# Patient Record
Sex: Male | Born: 1965
Health system: Southern US, Community
[De-identification: ages and names within clinical notes are randomized; demographics above are authoritative.]

## PROBLEM LIST (undated history)

## (undated) DIAGNOSIS — E119 Type 2 diabetes mellitus without complications: Secondary | ICD-10-CM

## (undated) DIAGNOSIS — E785 Hyperlipidemia, unspecified: Secondary | ICD-10-CM

## (undated) DIAGNOSIS — I213 ST elevation (STEMI) myocardial infarction of unspecified site: Secondary | ICD-10-CM

## (undated) HISTORY — DX: Type 2 diabetes mellitus without complications: E11.9

## (undated) HISTORY — DX: Hyperlipidemia, unspecified: E78.5

## (undated) HISTORY — PX: UPPER GASTROINTESTINAL ENDOSCOPY: SHX188

## (undated) HISTORY — PX: COLONOSCOPY: SHX174

## (undated) HISTORY — PX: NO PAST SURGERIES: SHX2092

---

## 2005-01-16 ENCOUNTER — Ambulatory Visit: Payer: Self-pay | Admitting: Family Medicine

## 2005-01-23 ENCOUNTER — Ambulatory Visit: Payer: Self-pay | Admitting: Internal Medicine

## 2011-05-25 ENCOUNTER — Ambulatory Visit (INDEPENDENT_AMBULATORY_CARE_PROVIDER_SITE_OTHER): Payer: 59

## 2011-05-25 DIAGNOSIS — B079 Viral wart, unspecified: Secondary | ICD-10-CM

## 2011-05-25 DIAGNOSIS — E119 Type 2 diabetes mellitus without complications: Secondary | ICD-10-CM

## 2011-05-27 ENCOUNTER — Ambulatory Visit (INDEPENDENT_AMBULATORY_CARE_PROVIDER_SITE_OTHER): Payer: 59

## 2011-05-27 DIAGNOSIS — B079 Viral wart, unspecified: Secondary | ICD-10-CM

## 2011-05-27 DIAGNOSIS — S60429A Blister (nonthermal) of unspecified finger, initial encounter: Secondary | ICD-10-CM

## 2011-07-18 ENCOUNTER — Other Ambulatory Visit: Payer: Self-pay | Admitting: Family Medicine

## 2011-08-05 ENCOUNTER — Ambulatory Visit (INDEPENDENT_AMBULATORY_CARE_PROVIDER_SITE_OTHER): Payer: 59 | Admitting: Family Medicine

## 2011-08-05 VITALS — BP 124/73 | HR 61 | Temp 97.8°F | Resp 16 | Ht 71.25 in | Wt 184.2 lb

## 2011-08-05 DIAGNOSIS — IMO0001 Reserved for inherently not codable concepts without codable children: Secondary | ICD-10-CM

## 2011-08-05 DIAGNOSIS — E1165 Type 2 diabetes mellitus with hyperglycemia: Secondary | ICD-10-CM

## 2011-08-05 LAB — POCT GLYCOSYLATED HEMOGLOBIN (HGB A1C): Hemoglobin A1C: 9

## 2011-08-05 MED ORDER — ROSUVASTATIN CALCIUM 10 MG PO TABS: 10.0000 mg | ORAL_TABLET | Freq: Every day | ORAL | Status: DC

## 2011-08-05 MED ORDER — LISINOPRIL 2.5 MG PO TABS: 2.5000 mg | ORAL_TABLET | Freq: Every day | ORAL | Status: DC

## 2011-08-05 MED ORDER — SAXAGLIPTIN HCL 2.5 MG PO TABS: 2.5000 mg | ORAL_TABLET | Freq: Every day | ORAL | Status: DC

## 2011-08-05 MED ORDER — METFORMIN HCL 1000 MG PO TABS: 1000.0000 mg | ORAL_TABLET | Freq: Two times a day (BID) | ORAL | Status: DC

## 2011-08-05 NOTE — Patient Instructions (Signed)
STOP glipizide.   Restart metformin - 1000mg  twice per day, and onglyza 2.5mg  one per day.  Check home blood sugars and if remain over 200 in next few weeks, return to clinic.  Otherwise recheck in 3 months.   Return to the clinic or go to the nearest emergency room if any of your symptoms worsen or new symptoms occur.

## 2011-08-05 NOTE — Progress Notes (Signed)
  Subjective:    Patient ID: Marcus Hart, male    DOB: 29-Aug-1965, 46 y.o.   MRN: [id number]  HPI Marcus Hart is a 46 y.o. male Hx of DM2 - last HGB A1c 7.6 05/25/11.  Prior on metformin 1 gram BID in July 2012 - A1C 8.0 - added Onglyza 2.5mg  qd, continued metformin 1 gram BID  03/03/11 - A1c 6.8 .  No changes in meds.  05/25/11 OV - A1c 7.6 - added glipizide 5mg  BID,  but patient thought was supposed to stop metformin and just take glipizide.  Has only been taking glipizide twice per day.  Home cbg's:  Fasting:  258, 207, 217.  Feels ok, but urinating more.  No abd pain, fever, n/v.       Review of Systems  Constitutional: Negative for fever and chills.  Respiratory: Negative for cough and shortness of breath.   Cardiovascular: Negative for chest pain.  Gastrointestinal: Negative for nausea, vomiting and abdominal pain.  Neurological: Negative for dizziness, light-headedness and headaches.       Objective:   Physical Exam  Constitutional: He is oriented to person, place, and time. He appears well-developed and well-nourished.  HENT:  Head: Normocephalic and atraumatic.  Eyes: Pupils are equal, round, and reactive to light.  Cardiovascular: Normal rate, regular rhythm, normal heart sounds and intact distal pulses.   Pulmonary/Chest: Effort normal and breath sounds normal.  Abdominal: Soft. There is no tenderness.  Neurological: He is alert and oriented to person, place, and time.       Microfilament testing of feet normal bilaterally.  Skin: Skin is warm, dry and intact. No rash noted.     Psychiatric: He has a normal mood and affect. His behavior is normal.    Results for orders placed in visit on 08/05/11  GLUCOSE, POCT (MANUAL RESULT ENTRY)      Component Value Range   POC Glucose 205    POCT GLYCOSYLATED HEMOGLOBIN (HGB A1C)      Component Value Range   Hemoglobin A1C 9.0         Assessment & Plan:  Marcus Hart is a 46 y.o. male. 1. Diabetes type 2,  uncontrolled  POCT glucose (manual entry), POCT glycosylated hemoglobin (Hb A1C)  confusion regarding prior med change, with recent uncontrolled cbg's.  Possible med changes d/w pt and wife - they feel like onglyza worked better. Restart metformin 1 gram BID, onglyza 2.5mg  qd, and stop glipizide.  Continue crestor, lisinopril and aspirin at current doses.  Check fasting blood sugars and 2 hour post prandials - if remain above 200, rtc next few weeks, otherwise schedule 3 month follow up.  Discussed hypopigmentation after cryotherapy for verrucal lesions, and possible persistence.  If any recurrence of lesions, rtc.

## 2011-11-06 ENCOUNTER — Encounter: Payer: Self-pay | Admitting: Family Medicine

## 2011-11-06 ENCOUNTER — Ambulatory Visit (INDEPENDENT_AMBULATORY_CARE_PROVIDER_SITE_OTHER): Payer: 59 | Admitting: Family Medicine

## 2011-11-06 VITALS — BP 107/65 | HR 60 | Temp 97.5°F | Resp 16 | Ht 71.0 in | Wt 179.0 lb

## 2011-11-06 DIAGNOSIS — E119 Type 2 diabetes mellitus without complications: Secondary | ICD-10-CM

## 2011-11-06 DIAGNOSIS — K219 Gastro-esophageal reflux disease without esophagitis: Secondary | ICD-10-CM

## 2011-11-06 DIAGNOSIS — E1165 Type 2 diabetes mellitus with hyperglycemia: Secondary | ICD-10-CM

## 2011-11-06 DIAGNOSIS — E785 Hyperlipidemia, unspecified: Secondary | ICD-10-CM

## 2011-11-06 DIAGNOSIS — IMO0001 Reserved for inherently not codable concepts without codable children: Secondary | ICD-10-CM

## 2011-11-06 LAB — COMPREHENSIVE METABOLIC PANEL
ALT: 16 U/L (ref 0–53)
CO2: 26 mEq/L (ref 19–32)
Calcium: 9.3 mg/dL (ref 8.4–10.5)
Chloride: 103 mEq/L (ref 96–112)
Creat: 0.84 mg/dL (ref 0.50–1.35)
Glucose, Bld: 116 mg/dL — ABNORMAL HIGH (ref 70–99)
Total Protein: 6.4 g/dL (ref 6.0–8.3)

## 2011-11-06 LAB — LIPID PANEL
Cholesterol: 138 mg/dL (ref 0–200)
Total CHOL/HDL Ratio: 2.6 Ratio

## 2011-11-06 LAB — POCT GLYCOSYLATED HEMOGLOBIN (HGB A1C): Hemoglobin A1C: 8.1

## 2011-11-06 MED ORDER — ROSUVASTATIN CALCIUM 10 MG PO TABS: 10.0000 mg | ORAL_TABLET | Freq: Every day | ORAL | Status: DC

## 2011-11-06 MED ORDER — METFORMIN HCL 1000 MG PO TABS: 1000.0000 mg | ORAL_TABLET | Freq: Two times a day (BID) | ORAL | Status: DC

## 2011-11-06 MED ORDER — SAXAGLIPTIN HCL 5 MG PO TABS: 5.0000 mg | ORAL_TABLET | Freq: Every day | ORAL | Status: DC

## 2011-11-06 MED ORDER — LISINOPRIL 2.5 MG PO TABS: 2.5000 mg | ORAL_TABLET | Freq: Every day | ORAL | Status: DC

## 2011-11-06 NOTE — Patient Instructions (Signed)
Your should receive a call or letter about your lab results within the next week to 10 days.  Recheck in 3 months, sooner if any recurrent problems with heartburn.  You can try over the counter zantac as needed and avoid the triggers of heartburn as stated below.  Return to the clinic or go to the nearest emergency room if any of your symptoms worsen or new symptoms occur. Heartburn Heartburn is a painful, burning sensation in the chest. It may feel worse in certain positions, such as lying down or bending over. It is caused by stomach acid backing up into the tube that carries food from the mouth down to the stomach (lower esophagus).  CAUSES   Large meals.   Certain foods and drinks.   Exercise.   Increased acid production.   Being overweight or obese.   Certain medicines.  SYMPTOMS   Burning pain in the chest or lower throat.   Bitter taste in the mouth.   Coughing.  DIAGNOSIS  If the usual treatments for heartburn do not improve your symptoms, then tests may be done to see if there is another condition present. Possible tests may include:  X-rays.   Endoscopy. This is when a tube with a light and a camera on the end is used to examine the esophagus and the stomach.   A test to measure the amount of acid in the esophagus (pH test).   A test to see if the esophagus is working properly (esophageal manometry).   Blood, breath, or stool tests to check for bacteria that cause ulcers.  TREATMENT   Your caregiver may tell you to use certain over-the-counter medicines (antacids, acid reducers) for mild heartburn.   Your caregiver may prescribe medicines to decrease the acid in your stomach or protect your stomach lining.   Your caregiver may recommend certain diet changes.   For severe cases, your caregiver may recommend that the head of your bed be elevated on blocks. (Sleeping with more pillows is not an effective treatment as it only changes the position of your head and  does not improve the main problem of stomach acid refluxing into the esophagus.)  HOME CARE INSTRUCTIONS   Take all medicines as directed by your caregiver.   Raise the head of your bed by putting blocks under the legs if instructed to by your caregiver.   Do not exercise right after eating.   Avoid eating 2 or 3 hours before bed. Do not lie down right after eating.   Eat small meals throughout the day instead of 3 large meals.   Stop smoking if you smoke.   Maintain a healthy weight.   Identify foods and beverages that make your symptoms worse and avoid them. Foods you may want to avoid include:   Peppers.   Chocolate.   High-fat foods, including fried foods.   Spicy foods.   Garlic and onions.   Citrus fruits, including oranges, grapefruit, lemons, and limes.   Food containing tomatoes or tomato products.   Mint.   Carbonated drinks, caffeinated drinks, and alcohol.   Vinegar.  SEEK IMMEDIATE MEDICAL CARE IF:  You have severe chest pain that goes down your arm or into your jaw or neck.   You feel sweaty, dizzy, or lightheaded.   You are short of breath.   You vomit blood.   You have difficulty or pain with swallowing.   You have bloody or black, tarry stools.   You have episodes of heartburn  more than 3 times a week for more than 2 weeks.  MAKE SURE YOU:  Understand these instructions.   Will watch your condition.   Will get help right away if you are not doing well or get worse.  Document Released: 09/20/2008 Document Revised: 04/23/2011 Document Reviewed: 10/19/2010 [facility] Patient Information 2012 [place], [place].

## 2011-11-06 NOTE — Progress Notes (Signed)
Subjective:    Patient ID: Marcus Hart, male    DOB: 01-17-1966, 46 y.o.   MRN: [id number]  HPI Marcus Hart is a 45 y.o. male Here for DM follow up.  Last a1c 9.0.  Changed glipizide to onglyza.  Home blood sugars - last few days - 90 fasting, post prandial around 130's. No symptomatic lows.  Metformin 1 gram BID, onglyza 2.5mg , crestor 10, and lisinopril 2.5mg . No missed doses.  No recent dentist visit.  Plans on scheduling.  Last optho visit - January - ok.  Feels good.  No concerns today, except occasional heartburn. Fasting since 6 am.  Lipids wnl 10/12.    Review of Systems  Constitutional: Negative for fatigue and unexpected weight change.  Eyes: Negative for visual disturbance.  Respiratory: Negative for cough, chest tightness and shortness of breath.   Cardiovascular: Negative for chest pain, palpitations and leg swelling.  Gastrointestinal: Negative for abdominal pain.       Stomach upset last month.  Feels ok now. Heartburn with certain foods.   Neurological: Negative for dizziness, light-headedness and headaches.       Objective:   Physical Exam  Constitutional: He is oriented to person, place, and time. He appears well-developed and well-nourished.  HENT:  Head: Normocephalic and atraumatic.  Eyes: Pupils are equal, round, and reactive to light.  Cardiovascular: Normal rate, regular rhythm, normal heart sounds and intact distal pulses.   Pulmonary/Chest: Effort normal and breath sounds normal.  Abdominal: Soft. He exhibits no distension. There is no tenderness. There is no rebound and no guarding.  Neurological: He is alert and oriented to person, place, and time.       Microfilament testing of feet normal bilaterally.  Skin: Skin is warm, dry and intact. No rash noted.  Psychiatric: He has a normal mood and affect. His behavior is normal.      Results for orders placed in visit on 11/06/11  GLUCOSE, POCT (MANUAL RESULT ENTRY)      Component Value Range     POC Glucose 121 (*) 70 - 99 mg/dl  POCT GLYCOSYLATED HEMOGLOBIN (HGB A1C)      Component Value Range   Hemoglobin A1C 8.1         Assessment & Plan:  Marcus Hart is a 46 y.o. male 1. Type II or unspecified type diabetes mellitus without mention of complication, not stated as uncontrolled  POCT glucose (manual entry), POCT glycosylated hemoglobin (Hb A1C)  2. Hyperlipidemia LDL goal < 100  rosuvastatin (CRESTOR) 10 MG tablet, Comprehensive metabolic panel, Lipid panel  3. DM (diabetes mellitus), type 2, uncontrolled  lisinopril (PRINIVIL,ZESTRIL) 2.5 MG tablet, metFORMIN (GLUCOPHAGE) 1000 MG tablet, rosuvastatin (CRESTOR) 10 MG tablet, Comprehensive metabolic panel, Lipid panel, saxagliptin HCl (ONGLYZA) 5 MG TABS tablet   Uncontrolled DM.  Cont metformin 1 gram BID, increase Onglyza to 5mg  QD, recheck in 3 months,  Check lipids, CMP. Continue lisinopril and crestor at current doses.  Episodic heartburn - avoid trigger foods. Can take zantac otc prn, and recheck if recurs.   Patient Instructions  Your should receive a call or letter about your lab results within the next week to 10 days.  Recheck in 3 months, sooner if any recurrent problems with heartburn.  You can try over the counter zantac as needed and avoid the triggers of heartburn as stated below.  Return to the clinic or go to the nearest emergency room if any of your symptoms worsen or new symptoms occur. Heartburn Heartburn is  a painful, burning sensation in the chest. It may feel worse in certain positions, such as lying down or bending over. It is caused by stomach acid backing up into the tube that carries food from the mouth down to the stomach (lower esophagus).  CAUSES   Large meals.   Certain foods and drinks.   Exercise.   Increased acid production.   Being overweight or obese.   Certain medicines.  SYMPTOMS   Burning pain in the chest or lower throat.   Bitter taste in the mouth.   Coughing.   DIAGNOSIS  If the usual treatments for heartburn do not improve your symptoms, then tests may be done to see if there is another condition present. Possible tests may include:  X-rays.   Endoscopy. This is when a tube with a light and a camera on the end is used to examine the esophagus and the stomach.   A test to measure the amount of acid in the esophagus (pH test).   A test to see if the esophagus is working properly (esophageal manometry).   Blood, breath, or stool tests to check for bacteria that cause ulcers.  TREATMENT   Your caregiver may tell you to use certain over-the-counter medicines (antacids, acid reducers) for mild heartburn.   Your caregiver may prescribe medicines to decrease the acid in your stomach or protect your stomach lining.   Your caregiver may recommend certain diet changes.   For severe cases, your caregiver may recommend that the head of your bed be elevated on blocks. (Sleeping with more pillows is not an effective treatment as it only changes the position of your head and does not improve the main problem of stomach acid refluxing into the esophagus.)  HOME CARE INSTRUCTIONS   Take all medicines as directed by your caregiver.   Raise the head of your bed by putting blocks under the legs if instructed to by your caregiver.   Do not exercise right after eating.   Avoid eating 2 or 3 hours before bed. Do not lie down right after eating.   Eat small meals throughout the day instead of 3 large meals.   Stop smoking if you smoke.   Maintain a healthy weight.   Identify foods and beverages that make your symptoms worse and avoid them. Foods you may want to avoid include:   Peppers.   Chocolate.   High-fat foods, including fried foods.   Spicy foods.   Garlic and onions.   Citrus fruits, including oranges, grapefruit, lemons, and limes.   Food containing tomatoes or tomato products.   Mint.   Carbonated drinks, caffeinated drinks, and  alcohol.   Vinegar.  SEEK IMMEDIATE MEDICAL CARE IF:  You have severe chest pain that goes down your arm or into your jaw or neck.   You feel sweaty, dizzy, or lightheaded.   You are short of breath.   You vomit blood.   You have difficulty or pain with swallowing.   You have bloody or black, tarry stools.   You have episodes of heartburn more than 3 times a week for more than 2 weeks.  MAKE SURE YOU:  Understand these instructions.   Will watch your condition.   Will get help right away if you are not doing well or get worse.  Document Released: 09/20/2008 Document Revised: 04/23/2011 Document Reviewed: 10/19/2010 [facility] Patient Information 2012 [place], [place].

## 2012-01-22 ENCOUNTER — Ambulatory Visit (INDEPENDENT_AMBULATORY_CARE_PROVIDER_SITE_OTHER): Payer: 59 | Admitting: Emergency Medicine

## 2012-01-22 VITALS — BP 124/76 | HR 77 | Temp 98.4°F | Resp 16 | Ht 71.0 in | Wt 176.0 lb

## 2012-01-22 DIAGNOSIS — S335XXA Sprain of ligaments of lumbar spine, initial encounter: Secondary | ICD-10-CM

## 2012-01-22 MED ORDER — TRAMADOL HCL 50 MG PO TABS: 50.0000 mg | ORAL_TABLET | Freq: Four times a day (QID) | ORAL | Status: AC | PRN

## 2012-01-22 MED ORDER — CYCLOBENZAPRINE HCL 10 MG PO TABS: 10.0000 mg | ORAL_TABLET | Freq: Three times a day (TID) | ORAL | Status: AC | PRN

## 2012-01-22 MED ORDER — NAPROXEN SODIUM 550 MG PO TABS: 550.0000 mg | ORAL_TABLET | Freq: Two times a day (BID) | ORAL | Status: AC

## 2012-01-22 NOTE — Progress Notes (Signed)
   Date:  01/22/2012   Name:  Marcus Hart   DOB:  01/10/1966   MRN:  [id number] Gender: male Age: 46 y.o.  PCP:  No primary provider on file.    Chief Complaint: Back Pain   History of Present Illness:  Marcus Hart is a 46 y.o. 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  Has low back pain that limits his activities such as bending forward that started last night.  Has no history of injury or specific overuse.  No radicular pain or neurologic symptoms.  Denies history of prior back injury.  There is no problem list on file for this patient.   No past medical history on file.  No past surgical history on file.  History  Substance Use Topics  . Smoking status: Never Smoker   . Smokeless tobacco: Not on file  . Alcohol Use: Not on file    No family history on file.  No Known Allergies  Medication list has been reviewed and updated.  Current Outpatient Prescriptions on File Prior to Visit  Medication Sig Dispense Refill  . aspirin 81 MG tablet Take 81 mg by mouth daily.      Marland Kitchen lisinopril (PRINIVIL,ZESTRIL) 2.5 MG tablet Take 1 tablet (2.5 mg total) by mouth daily.  90 tablet  1  . metFORMIN (GLUCOPHAGE) 1000 MG tablet Take 1 tablet (1,000 mg total) by mouth 2 (two) times daily with a meal.  180 tablet  1  . rosuvastatin (CRESTOR) 10 MG tablet Take 1 tablet (10 mg total) by mouth daily.  90 tablet  1  . saxagliptin HCl (ONGLYZA) 5 MG TABS tablet Take 1 tablet (5 mg total) by mouth daily.  90 tablet  1    Review of Systems:  As per HPI, otherwise negative.    Physical Examination: Filed Vitals:   01/22/12 1447  BP: 124/76  Pulse: 77  Temp: 98.4 F (36.9 C)  Resp: 16   Filed Vitals:   01/22/12 1447  Height: 5\' 11"  (1.803 m)  Weight: 176 lb (79.833 kg)   Body mass index is 24.55 kg/(m^2). Ideal Body Weight: Weight in (lb) to have BMI = 25: 178.9    GEN: WDWN, NAD, Non-toxic,  Alert & Oriented x 3 HEENT: Atraumatic, Normocephalic.  Ears and Nose: No external deformity. EXTR: No clubbing/cyanosis/edema NEURO: Normal gait.  PSYCH: Normally interactive. Conversant. Not depressed or anxious appearing.  Calm demeanor.  Back:  Tender lumbar paraspinous muscles bilaterally.  SLR negative, neuro intact with symmetric DTR's  Assessment and Plan: Lumbar strain Anaprox Flexeril Local heat Follow up as needed  Carmelina Dane, MD

## 2012-02-05 ENCOUNTER — Encounter: Payer: Self-pay | Admitting: Family Medicine

## 2012-02-05 ENCOUNTER — Ambulatory Visit (INDEPENDENT_AMBULATORY_CARE_PROVIDER_SITE_OTHER): Payer: 59 | Admitting: Family Medicine

## 2012-02-05 VITALS — BP 124/79 | HR 60 | Temp 97.4°F | Resp 16 | Ht 70.5 in | Wt 176.8 lb

## 2012-02-05 DIAGNOSIS — M545 Low back pain, unspecified: Secondary | ICD-10-CM

## 2012-02-05 DIAGNOSIS — IMO0001 Reserved for inherently not codable concepts without codable children: Secondary | ICD-10-CM

## 2012-02-05 DIAGNOSIS — E1165 Type 2 diabetes mellitus with hyperglycemia: Secondary | ICD-10-CM

## 2012-02-05 DIAGNOSIS — E119 Type 2 diabetes mellitus without complications: Secondary | ICD-10-CM

## 2012-02-05 LAB — POCT GLYCOSYLATED HEMOGLOBIN (HGB A1C): Hemoglobin A1C: 7.7

## 2012-02-05 LAB — GLUCOSE, POCT (MANUAL RESULT ENTRY): POC Glucose: 118 mg/dl — AB (ref 70–99)

## 2012-02-05 NOTE — Progress Notes (Signed)
  Subjective:    Patient ID: Marcus Hart, male    DOB: 02/26/1966, 46 y.o.   MRN: [id number]  HPI Marcus Hart is a 45 y.o. male  DM2- A1c 8.1 on 11/06/11. Continued metformin 1 gram BID, increased Onglyza to 5mg  QD.  Lipids and LFT's WNL.   Home blood sugars - 120-130 up to 160 or 180 post prandial.   No symptomatic low blood sugars - lowest 80 - one time. Has candy if needed. Had fasted for Ramadan during day, but was eating at night - affected blood sugars.  Low back pain - see ov 2 weeks ago with Dr. Dareen Piano. 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  [id number], Flexeril, Local heat.  Still some soreness noted last night - would like to try 2  Days off work - returns in 3 days.  .No bowel or bladder incontinence, no saddle anesthesia, no lower extremity weakness. Feeling better. Taking flexeril as needed, and naprosyn prn - not all the time. No refills needed.    2 girls and 1 boy at home.(youngest 5 months).  Review of Systems  Constitutional: Negative for fatigue and unexpected weight change.  Eyes: Negative for visual disturbance.  Respiratory: Negative for cough, chest tightness and shortness of breath.   Cardiovascular: Negative for chest pain, palpitations and leg swelling.  Gastrointestinal: Negative for abdominal pain and blood in stool.  Genitourinary: Negative for difficulty urinating.  Musculoskeletal: Positive for myalgias and back pain.  Skin: Negative for color change and rash.  Neurological: Negative for dizziness, weakness, light-headedness and headaches.       No le weakness. No bowel/bladder incontinence, no saddle anesthesia.   As above.      Objective:   Physical Exam  Constitutional: He is oriented to person, place, and time. He appears well-developed and well-nourished.  HENT:  Head: Normocephalic and atraumatic.  Eyes: Pupils are equal, round, and reactive to light.  Cardiovascular: Normal rate, regular rhythm,  normal heart sounds and intact distal pulses.   Pulmonary/Chest: Effort normal and breath sounds normal.  Abdominal: Soft. There is no tenderness.  Neurological: He is alert and oriented to person, place, and time.       Microfilament testing of feet normal bilaterally.  Skin: Skin is warm, dry and intact. No rash noted.  Psychiatric: He has a normal mood and affect. His behavior is normal.     Results for orders placed in visit on 02/05/12  GLUCOSE, POCT (MANUAL RESULT ENTRY)      Component Value Range   POC Glucose 118 (*) 70 - 99 mg/dl  POCT GLYCOSYLATED HEMOGLOBIN (HGB A1C)      Component Value Range   Hemoglobin A1C 7.1         Assessment & Plan:  Marcus Hart is a 46 y.o. male 1. Diabetes mellitus  POCT glucose (manual entry), POCT glycosylated hemoglobin (Hb A1C)  2. DM (diabetes mellitus), type 2, uncontrolled    3. Low back pain      DM2 - uncontrolled, but borderline and much improved.  Continue same doses of meds (should have 1 refill until December OV, but can call in if needed).  Continue to work on diet.   LBP - mechanical, muscular pain likely - improving.  Back care manual for HEP, naprosyn, flexeril prn only.   rtc precautions.  Recheck in 3 months.

## 2012-05-05 ENCOUNTER — Ambulatory Visit: Payer: 59

## 2012-05-05 ENCOUNTER — Ambulatory Visit (INDEPENDENT_AMBULATORY_CARE_PROVIDER_SITE_OTHER): Payer: 59 | Admitting: Family Medicine

## 2012-05-05 ENCOUNTER — Encounter: Payer: Self-pay | Admitting: Radiology

## 2012-05-05 VITALS — BP 110/74 | HR 84 | Temp 99.4°F | Resp 16 | Ht 71.0 in | Wt 179.2 lb

## 2012-05-05 DIAGNOSIS — R05 Cough: Secondary | ICD-10-CM

## 2012-05-05 DIAGNOSIS — J40 Bronchitis, not specified as acute or chronic: Secondary | ICD-10-CM

## 2012-05-05 DIAGNOSIS — R509 Fever, unspecified: Secondary | ICD-10-CM

## 2012-05-05 DIAGNOSIS — IMO0001 Reserved for inherently not codable concepts without codable children: Secondary | ICD-10-CM

## 2012-05-05 DIAGNOSIS — E785 Hyperlipidemia, unspecified: Secondary | ICD-10-CM

## 2012-05-05 DIAGNOSIS — E1165 Type 2 diabetes mellitus with hyperglycemia: Secondary | ICD-10-CM

## 2012-05-05 DIAGNOSIS — R059 Cough, unspecified: Secondary | ICD-10-CM

## 2012-05-05 LAB — POCT CBC
Hemoglobin: 14.7 g/dL (ref 14.1–18.1)
MCHC: 31.7 g/dL — AB (ref 31.8–35.4)
MID (cbc): 0.3 (ref 0–0.9)
MPV: 9.5 fL (ref 0–99.8)
Platelet Count, POC: 187 10*3/uL (ref 142–424)
RDW, POC: 13.4 %
WBC: 4.4 10*3/uL — AB (ref 4.6–10.2)

## 2012-05-05 LAB — COMPREHENSIVE METABOLIC PANEL
BUN: 19 mg/dL (ref 6–23)
CO2: 23 mEq/L (ref 19–32)
Creat: 0.93 mg/dL (ref 0.50–1.35)
Glucose, Bld: 143 mg/dL — ABNORMAL HIGH (ref 70–99)
Total Bilirubin: 0.4 mg/dL (ref 0.3–1.2)

## 2012-05-05 LAB — LIPID PANEL
Cholesterol: 159 mg/dL (ref 0–200)
Total CHOL/HDL Ratio: 3 Ratio
Triglycerides: 76 mg/dL (ref <150)
VLDL: 15 mg/dL (ref 0–40)

## 2012-05-05 LAB — POCT INFLUENZA A/B: Influenza A, POC: NEGATIVE

## 2012-05-05 MED ORDER — METFORMIN HCL 1000 MG PO TABS: 1000.0000 mg | ORAL_TABLET | Freq: Two times a day (BID) | ORAL | Status: DC

## 2012-05-05 MED ORDER — HYDROCODONE-HOMATROPINE 5-1.5 MG/5ML PO SYRP: ORAL_SOLUTION | ORAL | Status: DC

## 2012-05-05 MED ORDER — SAXAGLIPTIN HCL 5 MG PO TABS: 5.0000 mg | ORAL_TABLET | Freq: Every day | ORAL | Status: DC

## 2012-05-05 MED ORDER — ROSUVASTATIN CALCIUM 10 MG PO TABS: 10.0000 mg | ORAL_TABLET | Freq: Every day | ORAL | Status: DC

## 2012-05-05 MED ORDER — AZITHROMYCIN 500 MG PO TABS: 500.0000 mg | ORAL_TABLET | Freq: Every day | ORAL | Status: DC

## 2012-05-05 MED ORDER — LISINOPRIL 2.5 MG PO TABS: 2.5000 mg | ORAL_TABLET | Freq: Every day | ORAL | Status: DC

## 2012-05-05 NOTE — Progress Notes (Signed)
Patient ID: Marcus Hart MRN: [id number], DOB: [birth year], 46 y.o. Date of Encounter: 05/05/2012, 9:01 AM  Primary Physician: Trinna Post, MD  Chief Complaint:  Chief Complaint  Patient presents with  . Cough    green sputum x 2 weeks  . Fever    HPI: 46 y.o. year old male presents with a two week history of nasal congestion, cough, and fever. Nasal congestion thick and green/yellow. Cough is productive of green/yellow sputum and not associated with time of day. Cough has occasionally woken him from his sleep. No associated shortness of breath or wheezing. He has had associated fever, chills, and myalgias at home, but has been unable to take his temperature there. No sore throat, sinus pressure, headache, otalgia, or abdominal pain. Normal hearing. Last took Tylenol at 6 PM the previous evening. No GI complaints. Appetite slightly decreased, but he is eating and has increased his fluids. His 30 month old daughter has been sick with similar symptoms. No recent antibiotics or recent travels. No leg trauma, sedentary periods, history of cancer, or tobacco use.   He was last seen for his diabetes on 02/05/12. Last A1C 7.1. Home blood sugars have been around 110. Has not checked in the last three days secondary to his above illness. Max blood sugar . No symptomatic low blood sugars. Taking medications daily without issue. Metformin 1000 mg bid, Lisinopril 2.5 mg daily, aspirin 21 mg daily, Crestor 10 mg daily, and Onglyza 5 mg daily.    Past Medical History  Diagnosis Date  . Diabetes mellitus   . Hyperlipidemia      Home Meds: Prior to Admission medications   Medication Sig Start Date End Date Taking? Authorizing Provider  aspirin 81 MG tablet Take 81 mg by mouth daily.   Yes Historical Provider, MD  lisinopril (PRINIVIL,ZESTRIL) 2.5 MG tablet Take 1 tablet (2.5 mg total) by mouth daily. 11/06/11  Yes Shade Flood, MD  metFORMIN (GLUCOPHAGE) 1000 MG tablet Take 1 tablet (1,000 mg  total) by mouth 2 (two) times daily with a meal. 11/06/11 11/05/12 Yes Shade Flood, MD  naproxen sodium (ANAPROX DS) 550 MG tablet Take 1 tablet (550 mg total) by mouth 2 (two) times daily with a meal. 01/22/12 01/21/13 Yes Phillips Odor, MD  rosuvastatin (CRESTOR) 10 MG tablet Take 1 tablet (10 mg total) by mouth daily. 11/06/11  Yes Shade Flood, MD  saxagliptin HCl (ONGLYZA) 5 MG TABS tablet Take 1 tablet (5 mg total) by mouth daily. 11/06/11  Yes Shade Flood, MD    Allergies: No Known Allergies  History   Social History  . Marital Status: Married    Spouse Name: N/A    Number of Children: N/A  . Years of Education: N/A   Occupational History  . Not on file.   Social History Main Topics  . Smoking status: Never Smoker   . Smokeless tobacco: Not on file  . Alcohol Use: Not on file  . Drug Use: Not on file  . Sexually Active: Not on file   Other Topics Concern  . Not on file   Social History Narrative  . No narrative on file     Review of Systems: Constitutional: Positive for fever, chills, and fatigue. Negative for night sweats or weight changes HEENT: See above Cardiovascular: negative for chest pain or palpitations Respiratory: Positive for cough. Negative for hemoptysis, wheezing, or shortness of breath Abdominal: negative for abdominal pain, nausea, vomiting or diarrhea Dermatological: negative for rash  Neurologic: negative for headache   Physical Exam: Blood pressure 110/74, pulse 84, temperature 99.4 F (37.4 C), temperature source Oral, resp. rate 16, height 5\' 11"  (1.803 m), weight 179 lb 3.2 oz (81.285 kg), SpO2 100.00%., Body mass index is 24.99 kg/(m^2). General: Well developed, well nourished, in no acute distress. Head: Normocephalic, atraumatic, eyes without discharge, sclera non-icteric, nares are congested. Bilateral auditory canals clear, TM's are without perforation, pearly grey with reflective cone of light bilaterally. No sinus TTP. Oral  cavity moist, dentition normal. Posterior pharynx with post nasal drip and mild erythema. No peritonsillar abscess or tonsillar exudate. Uvula midline.  Neck: Supple. No thyromegaly. Full ROM. No lymphadenopathy. Lungs: Coarse breath sounds bilaterally without wheezes, rales, or rhonchi. Breathing is unlabored.  Heart: RRR with S1 S2. No murmurs, rubs, or gallops appreciated. Msk:  Strength and tone normal for age. Extremities: No clubbing or cyanosis. No edema. Monofilament exam unremarkable bilaterally. No wounds along plantar surfaces of bilateral feet.  Neuro: Alert and oriented X 3. Moves all extremities spontaneously. CNII-XII grossly in tact. Psych:  Responds to questions appropriately with a normal affect.   Labs: Results for orders placed in visit on 05/05/12  POCT CBC      Component Value Range   WBC 4.4 (*) 4.6 - 10.2 K/uL   Lymph, poc 1.1  0.6 - 3.4   POC LYMPH PERCENT 25.9  10 - 50 %L   MID (cbc) 0.3  0 - 0.9   POC MID % 6.1  0 - 12 %M   POC Granulocyte 3.0  2 - 6.9   Granulocyte percent 68.0  37 - 80 %G   RBC 5.30  4.69 - 6.13 M/uL   Hemoglobin 14.7  14.1 - 18.1 g/dL   HCT, POC 16.1  09.6 - 53.7 %   MCV 87.6  80 - 97 fL   MCH, POC 27.7  27 - 31.2 pg   MCHC 31.7 (*) 31.8 - 35.4 g/dL   RDW, POC 04.5     Platelet Count, POC 187  142 - 424 K/uL   MPV 9.5  0 - 99.8 fL  POCT INFLUENZA A/B      Component Value Range   Influenza A, POC Negative     Influenza B, POC Negative    GLUCOSE, POCT (MANUAL RESULT ENTRY)      Component Value Range   POC Glucose 156 (*) 70 - 99 mg/dl  POCT GLYCOSYLATED HEMOGLOBIN (HGB A1C)      Component Value Range   Hemoglobin A1C 7.4     Last A1C: 7.1 in September 7.1  CMP and lipid pending.  CXR: [facility] reading (PRIMARY) by  Dr. Neva Seat. Increased markings bilaterally, right greater than left, without discrete infiltrate.   ASSESSMENT AND PLAN:  46 y.o. year old male with bronchitis, fever, chills, myalgias, uncontrolled diabetes  mellitus, & hyperlipidemia 1. Bronchitis/cough/fever/myalgias -Azithromycin 500 mg 1 po daily #5 no RF -Hycodan #4oz 1 tsp po q 4-6 hours prn cough no RF SED -Mucinex -Tylenol/Motrin prn -Rest/fluids -Out of work if needed through 05/06/12 -RTC precautions -RTC 3-5 days if no improvement  2. Uncontrolled diabetes mellitus -Continue current medications -Metformin 1000 mg 1 po bid #180 RF 1 -Onglyza 5 mg 1 po daily #90 RF 1 -Lisinopril 2.5 mg 1 po daily #90 RF 1 -Continue aspirin 81 mg daily -Healthy diet and exercise -Await labs  3. Hyperlipidemia -Continue current medications -Crestor 10 mg 1 po daily #90 RF 1 -Healthy diet and exercise -  Await labs  4. Follow up with Dr. Neva Seat in January 2014  Elinor Dodge, PA-C 05/05/2012 9:01 AM

## 2012-05-06 NOTE — Progress Notes (Signed)
Xray read and patient discussed with Ryan Dunn, PA-C. Agree with assessment and plan of care per his note.   

## 2012-05-09 ENCOUNTER — Ambulatory Visit: Payer: 59 | Admitting: Family Medicine

## 2012-05-16 ENCOUNTER — Ambulatory Visit: Payer: 59 | Admitting: Family Medicine

## 2012-05-23 ENCOUNTER — Ambulatory Visit (INDEPENDENT_AMBULATORY_CARE_PROVIDER_SITE_OTHER): Payer: 59 | Admitting: Family Medicine

## 2012-05-23 VITALS — BP 110/78 | HR 60 | Temp 98.3°F | Resp 16 | Ht 71.0 in | Wt 179.0 lb

## 2012-05-23 DIAGNOSIS — J069 Acute upper respiratory infection, unspecified: Secondary | ICD-10-CM

## 2012-05-23 DIAGNOSIS — R05 Cough: Secondary | ICD-10-CM

## 2012-05-23 DIAGNOSIS — R059 Cough, unspecified: Secondary | ICD-10-CM

## 2012-05-23 MED ORDER — LEVOFLOXACIN 500 MG PO TABS: 500.0000 mg | ORAL_TABLET | Freq: Every day | ORAL | Status: DC

## 2012-05-23 MED ORDER — BENZONATATE 100 MG PO CAPS: 200.0000 mg | ORAL_CAPSULE | Freq: Two times a day (BID) | ORAL | Status: AC | PRN

## 2012-05-23 NOTE — Progress Notes (Signed)
[facility]:  Office Visit  Chief Complaint:  Chief Complaint  Patient presents with  . Cough    x 3 week    HPI: Marcus Hart is a 47 y.o. male who complains of  Cough. Continues to have cough 3 weeks after treatment with Azithromycin 500 mg daily x 5 days. His cough is better in terms of green sputum, now more white and yellow. But he still has cough and had fever on Saturday, daughter has PNA based on ER assessment. Was on hydromet, which helped some. Did not take flu vaccine this year.  Denies SOB. + Sore throat from coughing.  Past Medical History  Diagnosis Date  . Diabetes mellitus   . Hyperlipidemia    History reviewed. No pertinent past surgical history. History   Social History  . Marital Status: Married    Spouse Name: N/A    Number of Children: N/A  . Years of Education: N/A   Social History Main Topics  . Smoking status: Never Smoker   . Smokeless tobacco: None  . Alcohol Use: None  . Drug Use: None  . Sexually Active: None   Other Topics Concern  . None   Social History Narrative  . None   History reviewed. No pertinent family history. No Known Allergies Prior to Admission medications   Medication Sig Start Date End Date Taking? Authorizing Provider  aspirin 81 MG tablet Take 81 mg by mouth daily.   Yes Historical Provider, MD  HYDROcodone-homatropine [facility]) 5-1.5 MG/5ML syrup 1 TSP PO Q 4-6 HOURS PRN COUGH 05/05/12  Yes Ryan M Dunn, PA-C  lisinopril (PRINIVIL,ZESTRIL) 2.5 MG tablet Take 1 tablet (2.5 mg total) by mouth daily. 05/05/12  Yes Ryan M Dunn, PA-C  metFORMIN (GLUCOPHAGE) 1000 MG tablet Take 1 tablet (1,000 mg total) by mouth 2 (two) times daily with a meal. 05/05/12 05/05/13 Yes Ryan M Dunn, PA-C  naproxen sodium (ANAPROX DS) 550 MG tablet Take 1 tablet (550 mg total) by mouth 2 (two) times daily with a meal. 01/22/12 01/21/13 Yes Phillips Odor, MD  rosuvastatin (CRESTOR) 10 MG tablet Take 1 tablet (10 mg total) by  mouth daily. 05/05/12  Yes Ryan M Dunn, PA-C  saxagliptin HCl (ONGLYZA) 5 MG TABS tablet Take 1 tablet (5 mg total) by mouth daily. 05/05/12  Yes Ryan M Dunn, PA-C  azithromycin (ZITHROMAX) 500 MG tablet Take 1 tablet (500 mg total) by mouth daily. 05/05/12   Sondra Barges, PA-C     ROS: The patient denies night sweats, unintentional weight loss, chest pain, palpitations, wheezing, dyspnea on exertion, nausea, vomiting, abdominal pain, dysuria, hematuria, melena, numbness, weakness, or tingling.  All other systems have been reviewed and were otherwise negative with the exception of those mentioned in the HPI and as above.    PHYSICAL EXAM: Filed Vitals:   05/23/12 1236  BP: 110/78  Pulse: 60  Temp: 98.3 F (36.8 C)  Resp: 16  Spo2    100% Filed Vitals:   05/23/12 1236  Height: 5\' 11"  (1.803 m)  Weight: 179 lb (81.194 kg)   Body mass index is 24.97 kg/(m^2).  General: Alert, no acute distress HEENT:  Normocephalic, atraumatic, oropharynx patent. Tm nl.No exudates. EOMI.  Cardiovascular:  Regular rate and rhythm, no rubs murmurs or gallops.  No Carotid bruits, radial pulse intact. No pedal edema.  Respiratory: Clear to auscultation bilaterally.  No wheezes, rales, or rhonchi.  No cyanosis, no use of accessory musculature GI: No organomegaly, abdomen  is soft and non-tender, positive bowel sounds.  No masses. Skin: No rashes. Neurologic: Facial musculature symmetric. Psychiatric: Patient is appropriate throughout our interaction. Lymphatic: No cervical lymphadenopathy Musculoskeletal: Gait intact.   LABS:    EKG/XRAY:   Primary read interpreted by Dr. Conley Rolls at [facility].   ASSESSMENT/PLAN: Encounter Diagnoses  Name Primary?  . URI, acute Yes  . Cough    Rx Levaquin for lack of improvement with Azithromycin, if this does not help then likely viral in origin. Will treat since has exposure to PNA and recurrence of fevers.  Rx Tessalon Perles for cough, c/w hydromet syrup prn  F/u  prn    LE, THAO PHUONG, DO 05/25/2012 11:53 AM

## 2012-07-22 ENCOUNTER — Encounter (HOSPITAL_COMMUNITY): Payer: Self-pay | Admitting: Emergency Medicine

## 2012-07-22 ENCOUNTER — Emergency Department (HOSPITAL_COMMUNITY)
Admission: EM | Admit: 2012-07-22 | Discharge: 2012-07-22 | Disposition: A | Discharge status: Home / Self Care | Payer: 59 | Source: Home / Self Care | Attending: Emergency Medicine | Admitting: Emergency Medicine

## 2012-07-22 ENCOUNTER — Emergency Department (INDEPENDENT_AMBULATORY_CARE_PROVIDER_SITE_OTHER): Payer: 59

## 2012-07-22 DIAGNOSIS — J209 Acute bronchitis, unspecified: Secondary | ICD-10-CM

## 2012-07-22 MED ORDER — HYDROCOD POLST-CHLORPHEN POLST 10-8 MG/5ML PO LQCR
5.0000 mL | Freq: Two times a day (BID) | ORAL | Status: DC | PRN
Start: 2012-07-22 — End: 2013-07-05

## 2012-07-22 MED ORDER — DOXYCYCLINE HYCLATE 100 MG PO TABS: 100.0000 mg | ORAL_TABLET | Freq: Two times a day (BID) | ORAL | Status: DC

## 2012-07-22 MED ORDER — ALBUTEROL SULFATE HFA 108 (90 BASE) MCG/ACT IN AERS: 2.0000 | INHALATION_SPRAY | Freq: Four times a day (QID) | RESPIRATORY_TRACT | Status: DC

## 2012-07-22 MED ORDER — BECLOMETHASONE DIPROPIONATE 80 MCG/ACT IN AERS: 2.0000 | INHALATION_SPRAY | Freq: Two times a day (BID) | RESPIRATORY_TRACT | Status: DC

## 2012-07-22 NOTE — ED Provider Notes (Signed)
Chief Complaint  Patient presents with  . Cough    History of Present Illness:   Marcus Hart  is a 47 year old male with diabetes and hyperlipidemia who presents with a three-week history of a cough productive of moderate amounts of green sputum. He denies any hemoptysis, wheezing, chest tightness, or chest pain. He has also had nasal congestion, rhinorrhea, slight sore throat, and headache. He's felt feverish and chilled. He denies any GI symptoms. 3 months ago he was seen at [facility] for pneumonia. He was given first a Z-Pak, he returned with no improvement and was given Levaquin and then did improve after that. He has done well up until the present.  Review of Systems:  Other than noted above, the patient denies any of the following symptoms. Systemic:  No fever, chills, sweats, fatigue, myalgias, headache, or anorexia. Eye:  No redness, pain or drainage. ENT:  No earache, ear congestion, nasal congestion, sneezing, rhinorrhea, sinus pressure, sinus pain, post nasal drip, or sore throat. Lungs:  No cough, sputum production, wheezing, shortness of breath, or chest pain. GI:  No abdominal pain, nausea, vomiting, or diarrhea.  PMFSH:  Past medical history, family history, social history, meds, and allergies were reviewed.  Physical Exam:   Vital signs:  BP 137/85  Pulse 66  Temp(Src) 98.8 F (37.1 C) (Oral)  Resp 16  SpO2 99% General:  Alert, in no distress. Eye:  No conjunctival injection or drainage. Lids were normal. ENT:  TMs and canals were normal, without erythema or inflammation.  Nasal mucosa was clear and uncongested, without drainage.  Mucous membranes were moist.  Pharynx was clear, without exudate or drainage.  There were no oral ulcerations or lesions. Neck:  Supple, no adenopathy, tenderness or mass. Lungs:  No respiratory distress.  Lungs were clear to auscultation, without wheezes, rales or rhonchi.  Breath sounds were clear and equal bilaterally.   Heart:  Regular rhythm, without gallops, murmers or rubs. Skin:  Clear, warm, and dry, without rash or lesions.  Radiology:  Dg Chest 2 View  07/22/2012  *RADIOLOGY REPORT*  Clinical Data: Productive cough for 3 weeks, history diabetes, hyperlipidemia  CHEST - 2 VIEW  Comparison: 05/05/2012  Findings: Normal heart size, mediastinal contours, and pulmonary vascularity. Lungs clear. No pleural effusion or pneumothorax. Bones unremarkable.  IMPRESSION: No acute abnormalities.   Original Report Authenticated By: Ulyses Southward, M.D.    I reviewed the images independently and personally and concur with the radiologist's findings.  Assessment:  The encounter diagnosis was Acute bronchitis.  He appears to have bronchitis. There is no evidence for pneumonia this time.  Plan:   1.  The following meds were prescribed:   Discharge Medication List as of 07/22/2012  5:16 PM    START taking these medications   Details  albuterol (PROVENTIL HFA;VENTOLIN HFA) 108 (90 BASE) MCG/ACT inhaler Inhale 2 puffs into the lungs 4 (four) times daily., Starting 07/22/2012, Until Discontinued, Normal    beclomethasone (QVAR) 80 MCG/ACT inhaler Inhale 2 puffs into the lungs 2 (two) times daily., Starting 07/22/2012, Until Discontinued, Normal    chlorpheniramine-HYDROcodone (TUSSIONEX) 10-8 MG/5ML LQCR Take 5 mLs by mouth every 12 (twelve) hours as needed., Starting 07/22/2012, Until Discontinued, Normal    doxycycline (VIBRA-TABS) 100 MG tablet Take 1 tablet (100 mg total) by mouth 2 (two) times daily., Starting 07/22/2012, Until Discontinued, Normal       2.  The patient was instructed in symptomatic care and handouts were given. 3.  The patient was told to return if becoming worse in any way, if no better in one week, and given some red flag symptoms such as difficulty breathing, chest pain, or high fever that would indicate earlier return.   Reuben Likes, MD 07/22/12 [phone]

## 2012-07-22 NOTE — ED Notes (Signed)
C/o cough x 3 weeks, with runny nose with greenish congestion.  No ear ache or sore throat.  Had fever last night and used Tylenol.

## 2012-08-03 ENCOUNTER — Ambulatory Visit (INDEPENDENT_AMBULATORY_CARE_PROVIDER_SITE_OTHER): Payer: 59 | Admitting: Family Medicine

## 2012-08-03 ENCOUNTER — Ambulatory Visit: Payer: 59

## 2012-08-03 VITALS — BP 144/92 | HR 60 | Temp 98.7°F | Resp 16 | Ht 71.0 in | Wt 181.4 lb

## 2012-08-03 DIAGNOSIS — M545 Low back pain, unspecified: Secondary | ICD-10-CM

## 2012-08-03 DIAGNOSIS — S39012A Strain of muscle, fascia and tendon of lower back, initial encounter: Secondary | ICD-10-CM

## 2012-08-03 DIAGNOSIS — S335XXA Sprain of ligaments of lumbar spine, initial encounter: Secondary | ICD-10-CM

## 2012-08-03 MED ORDER — NAPROXEN 500 MG PO TABS
500.0000 mg | ORAL_TABLET | Freq: Two times a day (BID) | ORAL | Status: DC
Start: 2012-08-03 — End: 2013-07-05

## 2012-08-03 MED ORDER — CYCLOBENZAPRINE HCL 5 MG PO TABS: 5.0000 mg | ORAL_TABLET | Freq: Two times a day (BID) | ORAL | Status: DC | PRN

## 2012-08-03 NOTE — Progress Notes (Signed)
[address]   [place], [place]  [address]   [phone]  Subjective:    Patient ID: Marcus Hart, male    DOB: [birth year], 47 y.o.   MRN: [id number]  HPI This 47 y.o. male presents for evaluation of lower back pain. Helped friend move yesterday.  Onset of pain after helping friend move.  Bending makes worse; twisting makes worse.No radiation into legs.  No n/t/w.  Normal b/b function; no saddle paresthesias.  Pain kept awake last night.  No dysuria, hematuria.  History of back injury other than in 01/2012 when presented to [facility] for lower back pain; no xrays obtained at that time.  No medications for pain.  PCP:  Neva Seat   Review of Systems  Constitutional: Negative for fever, chills, diaphoresis and fatigue.  Genitourinary: Negative for decreased urine volume.  Musculoskeletal: Positive for myalgias and back pain. Negative for joint swelling and arthralgias.  Skin: Negative for rash.  Neurological: Negative for weakness and numbness.    Past Medical History  Diagnosis Date  . Diabetes mellitus   . Hyperlipidemia     History reviewed. No pertinent past surgical history.  Prior to Admission medications   Medication Sig Start Date End Date Taking? Authorizing Provider  albuterol (PROVENTIL HFA;VENTOLIN HFA) 108 (90 BASE) MCG/ACT inhaler Inhale 2 puffs into the lungs 4 (four) times daily. 07/22/12  Yes Reuben Likes, MD  aspirin 81 MG tablet Take 81 mg by mouth daily.   Yes Historical Provider, MD  HYDROcodone-homatropine [facility]) 5-1.5 MG/5ML syrup 1 TSP PO Q 4-6 HOURS PRN COUGH 05/05/12  Yes Ryan M Dunn, PA-C  lisinopril (PRINIVIL,ZESTRIL) 2.5 MG tablet Take 1 tablet (2.5 mg total) by mouth daily. 05/05/12  Yes Ryan M Dunn, PA-C  metFORMIN (GLUCOPHAGE) 1000 MG tablet Take 1 tablet (1,000 mg total) by mouth 2 (two) times daily with a meal. 05/05/12 05/05/13 Yes Ryan M Dunn, PA-C  rosuvastatin (CRESTOR) 10 MG tablet Take 1 tablet (10 mg total) by mouth daily. 05/05/12  Yes Ryan M  Dunn, PA-C  saxagliptin HCl (ONGLYZA) 5 MG TABS tablet Take 1 tablet (5 mg total) by mouth daily. 05/05/12  Yes Ryan M Dunn, PA-C  beclomethasone (QVAR) 80 MCG/ACT inhaler Inhale 2 puffs into the lungs 2 (two) times daily. 07/22/12   Reuben Likes, MD  chlorpheniramine-HYDROcodone (TUSSIONEX) 10-8 MG/5ML LQCR Take 5 mLs by mouth every 12 (twelve) hours as needed. 07/22/12   Reuben Likes, MD  doxycycline (VIBRA-TABS) 100 MG tablet Take 1 tablet (100 mg total) by mouth 2 (two) times daily. 07/22/12   Reuben Likes, MD  levofloxacin (LEVAQUIN) 500 MG tablet Take 1 tablet (500 mg total) by mouth daily. 05/23/12   Thao P Le, DO  naproxen sodium (ANAPROX DS) 550 MG tablet Take 1 tablet (550 mg total) by mouth 2 (two) times daily with a meal. 01/22/12 01/21/13  Phillips Odor, MD    No Known Allergies  History   Social History  . Marital Status: Married    Spouse Name: N/A    Number of Children: N/A  . Years of Education: N/A   Occupational History  . Not on file.   Social History Main Topics  . Smoking status: Never Smoker   . Smokeless tobacco: Not on file  . Alcohol Use: No  . Drug Use: No  . Sexually Active: Yes   Other Topics Concern  . Not on file   Social History Narrative  . No narrative on file  Family History  Problem Relation Age of Onset  . Diabetes Mother   . Ulcers Father   . Benign prostatic hyperplasia Father        Objective:   Physical Exam  Nursing note and vitals reviewed. Constitutional: He is oriented to person, place, and time. He appears well-developed and well-nourished. No distress.  Musculoskeletal:       Lumbar back: He exhibits decreased range of motion, tenderness, bony tenderness and pain. He exhibits no swelling, no edema, no deformity and no spasm.  LUMBAR SPINE:  +TTP MIDLINE LUMBAR REGION; +TTP PARASPINAL REGIONS B; STRAIGHT LEG RAISES NEGATIVE; TOE AND HEEL WALKING INTACT; MARCHING INTACT.    Neurological: He is alert and oriented to person,  place, and time. He has normal reflexes. No cranial nerve deficit or sensory deficit. He exhibits normal muscle tone. Coordination normal.  Skin: He is not diaphoretic.  Psychiatric: He has a normal mood and affect. His behavior is normal.    [facility] reading (PRIMARY) by  Dr. Katrinka Blazing.  LS SPINE FILMS: NAD      Assessment & Plan:  Lower back pain - Plan: DG Lumbar Spine Complete, CANCELED: DG Lumbar Spine 2-3 Views  Lumbar strain, initial encounter - Plan: naproxen (NAPROSYN) 500 MG tablet, cyclobenzaprine (FLEXERIL) 5 MG tablet   1. Lumbar strain/pain:  New.  Rx for Naproxen and Flexeril provided. Recommend rest, stretching, heat to area.  Home exercise program provided to perform for next two weeks.  OOW note x 2 days.Call if no improvement in two weeks.  Meds ordered this encounter  Medications  . naproxen (NAPROSYN) 500 MG tablet    Sig: Take 1 tablet (500 mg total) by mouth 2 (two) times daily with a meal.    Dispense:  30 tablet    Refill:  0  . cyclobenzaprine (FLEXERIL) 5 MG tablet    Sig: Take 1 tablet (5 mg total) by mouth 2 (two) times daily as needed for muscle spasms.    Dispense:  30 tablet    Refill:  0

## 2012-08-03 NOTE — Patient Instructions (Addendum)
Lower back pain - Plan: DG Lumbar Spine Complete, CANCELED: DG Lumbar Spine 2-3 Views  Lumbar strain, initial encounter - Plan: naproxen (NAPROSYN) 500 MG tablet, cyclobenzaprine (FLEXERIL) 5 MG tablet  Low Back Sprain with Rehab  A sprain is an injury in which a ligament is torn. The ligaments of the lower back are vulnerable to sprains. However, they are strong and require great force to be injured. These ligaments are important for stabilizing the spinal column. Sprains are classified into three categories. Grade 1 sprains cause pain, but the tendon is not lengthened. Grade 2 sprains include a lengthened ligament, due to the ligament being stretched or partially ruptured. With grade 2 sprains there is still function, although the function may be decreased. Grade 3 sprains involve a complete tear of the tendon or muscle, and function is usually impaired. SYMPTOMS   Severe pain in the lower back.  Sometimes, a feeling of a "pop," "snap," or tear, at the time of injury.  Tenderness and sometimes swelling at the injury site.  Uncommonly, bruising (contusion) within 48 hours of injury.  Muscle spasms in the back. CAUSES  Low back sprains occur when a force is placed on the ligaments that is greater than they can handle. Common causes of injury include:  Performing a stressful act while off-balance.  Repetitive stressful activities that involve movement of the lower back.  Direct hit (trauma) to the lower back. RISK INCREASES WITH:  Contact sports (football, wrestling).  Collisions (major skiing accidents).  Sports that require throwing or lifting (baseball, weightlifting).  Sports involving twisting of the spine (gymnastics, diving, tennis, golf).  Poor strength and flexibility.  Inadequate protection.  Previous back injury or surgery (especially fusion). PREVENTION  Wear properly fitted and padded protective equipment.  Warm up and stretch properly before  activity.  Allow for adequate recovery between workouts.  Maintain physical fitness:  Strength, flexibility, and endurance.  Cardiovascular fitness.  Maintain a healthy body weight. PROGNOSIS  If treated properly, low back sprains usually heal with non-surgical treatment. The length of time for healing depends on the severity of the injury.  RELATED COMPLICATIONS   Recurring symptoms, resulting in a chronic problem.  Chronic inflammation and pain in the low back.  Delayed healing or resolution of symptoms, especially if activity is resumed too soon.  Prolonged impairment.  Unstable or arthritic joints of the low back. TREATMENT  Treatment first involves the use of ice and medicine, to reduce pain and inflammation. The use of strengthening and stretching exercises may help reduce pain with activity. These exercises may be performed at home or with a therapist. Severe injuries may require referral to a therapist for further evaluation and treatment, such as ultrasound. Your caregiver may advise that you wear a back brace or corset, to help reduce pain and discomfort. Often, prolonged bed rest results in greater harm then benefit. Corticosteroid injections may be recommended. However, these should be reserved for the most serious cases. It is important to avoid using your back when lifting objects. At night, sleep on your back on a firm mattress, with a pillow placed under your knees. If non-surgical treatment is unsuccessful, surgery may be needed.  MEDICATION   If pain medicine is needed, nonsteroidal anti-inflammatory medicines (aspirin and ibuprofen), or other minor pain relievers (acetaminophen), are often advised.  Do not take pain medicine for 7 days before surgery.  Prescription pain relievers may be given, if your caregiver thinks they are needed. Use only as directed and only  as much as you need.  Ointments applied to the skin may be helpful.  Corticosteroid injections may  be given by your caregiver. These injections should be reserved for the most serious cases, because they may only be given a certain number of times. HEAT AND COLD  Cold treatment (icing) should be applied for 10 to 15 minutes every 2 to 3 hours for inflammation and pain, and immediately after activity that aggravates your symptoms. Use ice packs or an ice massage.  Heat treatment may be used before performing stretching and strengthening activities prescribed by your caregiver, physical therapist, or athletic trainer. Use a heat pack or a warm water soak. SEEK MEDICAL CARE IF:   Symptoms get worse or do not improve in 2 to 4 weeks, despite treatment.  You develop numbness or weakness in either leg.  You lose bowel or bladder function.  Any of the following occur after surgery: fever, increased pain, swelling, redness, drainage of fluids, or bleeding in the affected area.  New, unexplained symptoms develop. (Drugs used in treatment may produce side effects.) EXERCISES  RANGE OF MOTION (ROM) AND STRETCHING EXERCISES - Low Back Sprain Most people with lower back pain will find that their symptoms get worse with excessive bending forward (flexion) or arching at the lower back (extension). The exercises that will help resolve your symptoms will focus on the opposite motion.  Your physician, physical therapist or athletic trainer will help you determine which exercises will be most helpful to resolve your lower back pain. Do not complete any exercises without first consulting with your caregiver. Discontinue any exercises which make your symptoms worse, until you speak to your caregiver. If you have pain, numbness or tingling which travels down into your buttocks, leg or foot, the goal of the therapy is for these symptoms to move closer to your back and eventually resolve. Sometimes, these leg symptoms will get better, but your lower back pain may worsen. This is often an indication of progress in  your rehabilitation. Be very alert to any changes in your symptoms and the activities in which you participated in the 24 hours prior to the change. Sharing this information with your caregiver will allow him or her to most efficiently treat your condition. These exercises may help you when beginning to rehabilitate your injury. Your symptoms may resolve with or without further involvement from your physician, physical therapist or athletic trainer. While completing these exercises, remember:   Restoring tissue flexibility helps normal motion to return to the joints. This allows healthier, less painful movement and activity.  An effective stretch should be held for at least 30 seconds.  A stretch should never be painful. You should only feel a gentle lengthening or release in the stretched tissue. FLEXION RANGE OF MOTION AND STRETCHING EXERCISES: STRETCH  Flexion, Single Knee to Chest   Lie on a firm bed or floor with both legs extended in front of you.  Keeping one leg in contact with the floor, bring your opposite knee to your chest. Hold your leg in place by either grabbing behind your thigh or at your knee.  Pull until you feel a gentle stretch in your low back. Hold __________ seconds.  Slowly release your grasp and repeat the exercise with the opposite side. Repeat __________ times. Complete this exercise __________ times per day.  STRETCH  Flexion, Double Knee to Chest  Lie on a firm bed or floor with both legs extended in front of you.  Keeping one leg  in contact with the floor, bring your opposite knee to your chest.  Tense your stomach muscles to support your back and then lift your other knee to your chest. Hold your legs in place by either grabbing behind your thighs or at your knees.  Pull both knees toward your chest until you feel a gentle stretch in your low back. Hold __________ seconds.  Tense your stomach muscles and slowly return one leg at a time to the  floor. Repeat __________ times. Complete this exercise __________ times per day.  STRETCH  Low Trunk Rotation  Lie on a firm bed or floor. Keeping your legs in front of you, bend your knees so they are both pointed toward the ceiling and your feet are flat on the floor.  Extend your arms out to the side. This will stabilize your upper body by keeping your shoulders in contact with the floor.  Gently and slowly drop both knees together to one side until you feel a gentle stretch in your low back. Hold for __________ seconds.  Tense your stomach muscles to support your lower back as you bring your knees back to the starting position. Repeat the exercise to the other side. Repeat __________ times. Complete this exercise __________ times per day  EXTENSION RANGE OF MOTION AND FLEXIBILITY EXERCISES: STRETCH  Extension, Prone on Elbows   Lie on your stomach on the floor, a bed will be too soft. Place your palms about shoulder width apart and at the height of your head.  Place your elbows under your shoulders. If this is too painful, stack pillows under your chest.  Allow your body to relax so that your hips drop lower and make contact more completely with the floor.  Hold this position for __________ seconds.  Slowly return to lying flat on the floor. Repeat __________ times. Complete this exercise __________ times per day.  RANGE OF MOTION  Extension, Prone Press Ups  Lie on your stomach on the floor, a bed will be too soft. Place your palms about shoulder width apart and at the height of your head.  Keeping your back as relaxed as possible, slowly straighten your elbows while keeping your hips on the floor. You may adjust the placement of your hands to maximize your comfort. As you gain motion, your hands will come more underneath your shoulders.  Hold this position __________ seconds.  Slowly return to lying flat on the floor. Repeat __________ times. Complete this exercise __________  times per day.  RANGE OF MOTION- Quadruped, Neutral Spine   Assume a hands and knees position on a firm surface. Keep your hands under your shoulders and your knees under your hips. You may place padding under your knees for comfort.  Drop your head and point your tailbone toward the ground below you. This will round out your lower back like an angry cat. Hold this position for __________ seconds.  Slowly lift your head and release your tail bone so that your back sags into a large arch, like an old horse.  Hold this position for __________ seconds.  Repeat this until you feel limber in your low back.  Now, find your "sweet spot." This will be the most comfortable position somewhere between the two previous positions. This is your neutral spine. Once you have found this position, tense your stomach muscles to support your low back.  Hold this position for __________ seconds. Repeat __________ times. Complete this exercise __________ times per day.  STRENGTHENING EXERCISES -  Low Back Sprain These exercises may help you when beginning to rehabilitate your injury. These exercises should be done near your "sweet spot." This is the neutral, low-back arch, somewhere between fully rounded and fully arched, that is your least painful position. When performed in this safe range of motion, these exercises can be used for people who have either a flexion or extension based injury. These exercises may resolve your symptoms with or without further involvement from your physician, physical therapist or athletic trainer. While completing these exercises, remember:   Muscles can gain both the endurance and the strength needed for everyday activities through controlled exercises.  Complete these exercises as instructed by your physician, physical therapist or athletic trainer. Increase the resistance and repetitions only as guided.  You may experience muscle soreness or fatigue, but the pain or discomfort you  are trying to eliminate should never worsen during these exercises. If this pain does worsen, stop and make certain you are following the directions exactly. If the pain is still present after adjustments, discontinue the exercise until you can discuss the trouble with your caregiver. STRENGTHENING Deep Abdominals, Pelvic Tilt   Lie on a firm bed or floor. Keeping your legs in front of you, bend your knees so they are both pointed toward the ceiling and your feet are flat on the floor.  Tense your lower abdominal muscles to press your low back into the floor. This motion will rotate your pelvis so that your tail bone is scooping upwards rather than pointing at your feet or into the floor. With a gentle tension and even breathing, hold this position for __________ seconds. Repeat __________ times. Complete this exercise __________ times per day.  STRENGTHENING  Abdominals, Crunches   Lie on a firm bed or floor. Keeping your legs in front of you, bend your knees so they are both pointed toward the ceiling and your feet are flat on the floor. Cross your arms over your chest.  Slightly tip your chin down without bending your neck.  Tense your abdominals and slowly lift your trunk high enough to just clear your shoulder blades. Lifting higher can put excessive stress on the lower back and does not further strengthen your abdominal muscles.  Control your return to the starting position. Repeat __________ times. Complete this exercise __________ times per day.  STRENGTHENING  Quadruped, Opposite UE/LE Lift   Assume a hands and knees position on a firm surface. Keep your hands under your shoulders and your knees under your hips. You may place padding under your knees for comfort.  Find your neutral spine and gently tense your abdominal muscles so that you can maintain this position. Your shoulders and hips should form a rectangle that is parallel with the floor and is not twisted.  Keeping your trunk  steady, lift your right hand no higher than your shoulder and then your left leg no higher than your hip. Make sure you are not holding your breath. Hold this position for __________ seconds.  Continuing to keep your abdominal muscles tense and your back steady, slowly return to your starting position. Repeat with the opposite arm and leg. Repeat __________ times. Complete this exercise __________ times per day.  STRENGTHENING  Abdominals and Quadriceps, Straight Leg Raise   Lie on a firm bed or floor with both legs extended in front of you.  Keeping one leg in contact with the floor, bend the other knee so that your foot can rest flat on the floor.  Find your neutral spine, and tense your abdominal muscles to maintain your spinal position throughout the exercise.  Slowly lift your straight leg off the floor about 6 inches for a count of 15, making sure to not hold your breath.  Still keeping your neutral spine, slowly lower your leg all the way to the floor. Repeat this exercise with each leg __________ times. Complete this exercise __________ times per day. POSTURE AND BODY MECHANICS CONSIDERATIONS - Low Back Sprain Keeping correct posture when sitting, standing or completing your activities will reduce the stress put on different body tissues, allowing injured tissues a chance to heal and limiting painful experiences. The following are general guidelines for improved posture. Your physician or physical therapist will provide you with any instructions specific to your needs. While reading these guidelines, remember:  The exercises prescribed by your provider will help you have the flexibility and strength to maintain correct postures.  The correct posture provides the best environment for your joints to work. All of your joints have less wear and tear when properly supported by a spine with good posture. This means you will experience a healthier, less painful body.  Correct posture must be  practiced with all of your activities, especially prolonged sitting and standing. Correct posture is as important when doing repetitive low-stress activities (typing) as it is when doing a single heavy-load activity (lifting). RESTING POSITIONS Consider which positions are most painful for you when choosing a resting position. If you have pain with flexion-based activities (sitting, bending, stooping, squatting), choose a position that allows you to rest in a less flexed posture. You would want to avoid curling into a fetal position on your side. If your pain worsens with extension-based activities (prolonged standing, working overhead), avoid resting in an extended position such as sleeping on your stomach. Most people will find more comfort when they rest with their spine in a more neutral position, neither too rounded nor too arched. Lying on a non-sagging bed on your side with a pillow between your knees, or on your back with a pillow under your knees will often provide some relief. Keep in mind, being in any one position for a prolonged period of time, no matter how correct your posture, can still lead to stiffness. PROPER SITTING POSTURE In order to minimize stress and discomfort on your spine, you must sit with correct posture. Sitting with good posture should be effortless for a healthy body. Returning to good posture is a gradual process. Many people can work toward this most comfortably by using various supports until they have the flexibility and strength to maintain this posture on their own. When sitting with proper posture, your ears will fall over your shoulders and your shoulders will fall over your hips. You should use the back of the chair to support your upper back. Your lower back will be in a neutral position, just slightly arched. You may place a small pillow or folded towel at the base of your lower back for  support.  When working at a desk, create an environment that supports good,  upright posture. Without extra support, muscles tire, which leads to excessive strain on joints and other tissues. Keep these recommendations in mind: CHAIR:  A chair should be able to slide under your desk when your back makes contact with the back of the chair. This allows you to work closely.  The chair's height should allow your eyes to be level with the upper part of your monitor and  your hands to be slightly lower than your elbows. BODY POSITION  Your feet should make contact with the floor. If this is not possible, use a foot rest.  Keep your ears over your shoulders. This will reduce stress on your neck and low back. INCORRECT SITTING POSTURES  If you are feeling tired and unable to assume a healthy sitting posture, do not slouch or slump. This puts excessive strain on your back tissues, causing more damage and pain. Healthier options include:  Using more support, like a lumbar pillow.  Switching tasks to something that requires you to be upright or walking.  Talking a brief walk.  Lying down to rest in a neutral-spine position. PROLONGED STANDING WHILE SLIGHTLY LEANING FORWARD  When completing a task that requires you to lean forward while standing in one place for a long time, place either foot up on a stationary 2-4 inch high object to help maintain the best posture. When both feet are on the ground, the lower back tends to lose its slight inward curve. If this curve flattens (or becomes too large), then the back and your other joints will experience too much stress, tire more quickly, and can cause pain. CORRECT STANDING POSTURES Proper standing posture should be assumed with all daily activities, even if they only take a few moments, like when brushing your teeth. As in sitting, your ears should fall over your shoulders and your shoulders should fall over your hips. You should keep a slight tension in your abdominal muscles to brace your spine. Your tailbone should point down to  the ground, not behind your body, resulting in an over-extended swayback posture.  INCORRECT STANDING POSTURES  Common incorrect standing postures include a forward head, locked knees and/or an excessive swayback. WALKING Walk with an upright posture. Your ears, shoulders and hips should all line-up. PROLONGED ACTIVITY IN A FLEXED POSITION When completing a task that requires you to bend forward at your waist or lean over a low surface, try to find a way to stabilize 3 out of 4 of your limbs. You can place a hand or elbow on your thigh or rest a knee on the surface you are reaching across. This will provide you more stability, so that your muscles do not tire as quickly. By keeping your knees relaxed, or slightly bent, you will also reduce stress across your lower back. CORRECT LIFTING TECHNIQUES DO :  Assume a wide stance. This will provide you more stability and the opportunity to get as close as possible to the object which you are lifting.  Tense your abdominals to brace your spine. Bend at the knees and hips. Keeping your back locked in a neutral-spine position, lift using your leg muscles. Lift with your legs, keeping your back straight.  Test the weight of unknown objects before attempting to lift them.  Try to keep your elbows locked down at your sides in order get the best strength from your shoulders when carrying an object.  Always ask for help when lifting heavy or awkward objects. INCORRECT LIFTING TECHNIQUES DO NOT:   Lock your knees when lifting, even if it is a small object.  Bend and twist. Pivot at your feet or move your feet when needing to change directions.  Assume that you can safely pick up even a paperclip without proper posture. Document Released: 05/04/2005 Document Revised: 07/27/2011 Document Reviewed: 08/16/2008 [facility] Patient Information 2013 [place], [place].

## 2012-08-08 NOTE — Progress Notes (Signed)
Appt made with Dr. Neva Seat for 09/12/12 (first available).

## 2012-09-12 ENCOUNTER — Ambulatory Visit: Payer: 59 | Admitting: Family Medicine

## 2012-11-29 ENCOUNTER — Other Ambulatory Visit: Payer: Self-pay | Admitting: Physician Assistant

## 2012-12-05 ENCOUNTER — Encounter: Payer: 59 | Admitting: Family Medicine

## 2013-01-19 ENCOUNTER — Other Ambulatory Visit: Payer: Self-pay | Admitting: Physician Assistant

## 2013-02-13 ENCOUNTER — Encounter: Payer: Self-pay | Admitting: Family Medicine

## 2013-02-13 ENCOUNTER — Ambulatory Visit (INDEPENDENT_AMBULATORY_CARE_PROVIDER_SITE_OTHER): Payer: 59 | Admitting: Family Medicine

## 2013-02-13 VITALS — BP 108/70 | HR 55 | Temp 98.1°F | Resp 16 | Ht 71.0 in | Wt 174.0 lb

## 2013-02-13 DIAGNOSIS — E785 Hyperlipidemia, unspecified: Secondary | ICD-10-CM | POA: Insufficient documentation

## 2013-02-13 DIAGNOSIS — E1165 Type 2 diabetes mellitus with hyperglycemia: Secondary | ICD-10-CM | POA: Insufficient documentation

## 2013-02-13 DIAGNOSIS — Z1213 Encounter for screening for malignant neoplasm of small intestine: Secondary | ICD-10-CM

## 2013-02-13 DIAGNOSIS — Z8052 Family history of malignant neoplasm of bladder: Secondary | ICD-10-CM

## 2013-02-13 DIAGNOSIS — Z125 Encounter for screening for malignant neoplasm of prostate: Secondary | ICD-10-CM

## 2013-02-13 DIAGNOSIS — E119 Type 2 diabetes mellitus without complications: Secondary | ICD-10-CM | POA: Insufficient documentation

## 2013-02-13 DIAGNOSIS — Z0000 Encounter for general adult medical examination without abnormal findings: Secondary | ICD-10-CM

## 2013-02-13 DIAGNOSIS — Z23 Encounter for immunization: Secondary | ICD-10-CM

## 2013-02-13 DIAGNOSIS — IMO0001 Reserved for inherently not codable concepts without codable children: Secondary | ICD-10-CM

## 2013-02-13 LAB — CBC
HCT: 39.9 % (ref 39.0–52.0)
Platelets: 175 10*3/uL (ref 150–400)
RBC: 4.92 MIL/uL (ref 4.22–5.81)
RDW: 14.3 % (ref 11.5–15.5)
WBC: 3.3 10*3/uL — ABNORMAL LOW (ref 4.0–10.5)

## 2013-02-13 LAB — COMPREHENSIVE METABOLIC PANEL
Albumin: 4.5 g/dL (ref 3.5–5.2)
Alkaline Phosphatase: 51 U/L (ref 39–117)
BUN: 14 mg/dL (ref 6–23)
Calcium: 9.8 mg/dL (ref 8.4–10.5)
Chloride: 101 mEq/L (ref 96–112)
Glucose, Bld: 135 mg/dL — ABNORMAL HIGH (ref 70–99)
Potassium: 4.1 mEq/L (ref 3.5–5.3)
Sodium: 134 mEq/L — ABNORMAL LOW (ref 135–145)
Total Protein: 6.9 g/dL (ref 6.0–8.3)

## 2013-02-13 LAB — POCT GLYCOSYLATED HEMOGLOBIN (HGB A1C): Hemoglobin A1C: 10.2

## 2013-02-13 LAB — POCT URINALYSIS DIPSTICK
Leukocytes, UA: NEGATIVE
Nitrite, UA: NEGATIVE
Protein, UA: NEGATIVE
pH, UA: 6

## 2013-02-13 LAB — TSH: TSH: 0.648 u[IU]/mL (ref 0.350–4.500)

## 2013-02-13 LAB — LIPID PANEL
LDL Cholesterol: 84 mg/dL (ref 0–99)
Triglycerides: 162 mg/dL — ABNORMAL HIGH (ref <150)

## 2013-02-13 MED ORDER — METFORMIN HCL 1000 MG PO TABS: 1000.0000 mg | ORAL_TABLET | Freq: Two times a day (BID) | ORAL | Status: DC

## 2013-02-13 MED ORDER — LISINOPRIL 2.5 MG PO TABS: 2.5000 mg | ORAL_TABLET | Freq: Every day | ORAL | Status: DC

## 2013-02-13 MED ORDER — SAXAGLIPTIN HCL 5 MG PO TABS: 2.5000 mg | ORAL_TABLET | Freq: Every day | ORAL | Status: DC

## 2013-02-13 MED ORDER — ROSUVASTATIN CALCIUM 10 MG PO TABS: 10.0000 mg | ORAL_TABLET | Freq: Every day | ORAL | Status: DC

## 2013-02-13 NOTE — Patient Instructions (Addendum)
You should receive a call or letter about your lab results within the next week to 10 days.  Check your blood sugars 2-3 times per day, and bring record of these to office visit in next 4-6 weeks.  Return to the clinic or go to the nearest emergency room if any of your symptoms worsen or new symptoms occur.   Keeping you healthy  Get these tests  Blood pressure- Have your blood pressure checked once a year by your healthcare provider.  Normal blood pressure is 120/80.  Weight- Have your body mass index (BMI) calculated to screen for obesity.  BMI is a measure of body fat based on height and weight. You can also calculate your own BMI at https://www.west-esparza.com/.  Cholesterol- Have your cholesterol checked regularly starting at age 36, sooner may be necessary if you have diabetes, high blood pressure, if a family member developed heart diseases at an early age or if you smoke.   Chlamydia, HIV, and other sexual transmitted disease- Get screened each year until the age of 28 then within three months of each new sexual partner.  Diabetes- Have your blood sugar checked regularly if you have high blood pressure, high cholesterol, a family history of diabetes or if you are overweight.  Get these vaccines  Flu shot- Every fall.  Tetanus shot- Every 10 years.  Menactra- Single dose; prevents meningitis.  Take these steps  Don't smoke- If you do smoke, ask your healthcare provider about quitting. For tips on how to quit, go to www.smokefree.gov or call 1-800-QUIT-NOW.  Be physically active- Exercise 5 days a week for at least 30 minutes.  If you are not already physically active start slow and gradually work up to 30 minutes of moderate physical activity.  Examples of moderate activity include walking briskly, mowing the yard, dancing, swimming bicycling, etc.  Eat a healthy diet- Eat a variety of healthy foods such as fruits, vegetables, low fat milk, low fat cheese, yogurt, lean meats,  poultry, fish, beans, tofu, etc.  For more information on healthy eating, go to www.thenutritionsource.org  Drink alcohol in moderation- Limit alcohol intake two drinks or less a day.  Never drink and drive.  Dentist- Brush and floss teeth twice daily; visit your dentis twice a year.  Depression-Your emotional health is as important as your physical health.  If you're feeling down, losing interest in things you normally enjoy please talk with your healthcare provider.  Gun Safety- If you keep a gun in your home, keep it unloaded and with the safety lock on.  Bullets should be stored separately.  Helmet use- Always wear a helmet when riding a motorcycle, bicycle, rollerblading or skateboarding.  Safe sex- If you may be exposed to a sexually transmitted infection, use a condom  Seat belts- Seat bels can save your life; always wear one.  Smoke/Carbon Monoxide detectors- These detectors need to be installed on the appropriate level of your home.  Replace batteries at least once a year.  Skin Cancer- When out in the sun, cover up and use sunscreen SPF 15 or higher.  Violence- If anyone is threatening or hurting you, please tell your healthcare provider.

## 2013-02-13 NOTE — Progress Notes (Signed)
  Subjective:    Patient ID: Marcus Hart, male    DOB: 12-Apr-1966, 46 y.o.   MRN: [id number]  HPI    Review of Systems  Constitutional: Negative.   HENT: Negative.   Eyes: Negative.   Respiratory: Negative.   Cardiovascular: Negative.   Gastrointestinal: Negative.   Endocrine: Negative.   Genitourinary: Negative.   Musculoskeletal: Negative.   Skin: Negative.   Allergic/Immunologic: Negative.   Neurological: Negative.   Hematological: Negative.   Psychiatric/Behavioral: Negative.        Objective:   Physical Exam        Assessment & Plan:

## 2013-02-13 NOTE — Progress Notes (Signed)
Subjective:    Patient ID: Marcus Hart, male    DOB: 08/24/1965, 47 y.o.   MRN: [id number]  HPI Marcus Hart is a 47 y.o. male  Here for physical. Last visit with me in 04/2012. Missed a few other appointments. No specific concerns today.  Flu vaccine - today. Last tdap unknown. Will give today.  Only FH of cancer -= mom with bladder cancer in 50's. No other cancer in family or heart disease. No recent EKG.   DM2 - A1c 7.4 9 months ago.  Taking metformin 1000mg  BID, Onglyza 2.5mg  qd.  Rare missed dose.  On 2.5mg  lisinopril QD. Lipids wnl in 04/2012 as below with LDL 91. Tolerating crestor 10mg  qd, no new myalgias.  Last dentist eval: plans on scheduling. Over a year.  last optho eval: last Feb - visit Qyear.  Home blood sugars: up to 200 last few months, now into as low as 79. Has been watching diet recently. No symptomatic lows.    Results for orders placed in visit on 05/05/12  COMPREHENSIVE METABOLIC PANEL      Result Value Range   Sodium 132 (*) 135 - 145 mEq/L   Potassium 4.1  3.5 - 5.3 mEq/L   Chloride 99  96 - 112 mEq/L   CO2 23  19 - 32 mEq/L   Glucose, Bld 143 (*) 70 - 99 mg/dL   BUN 19  6 - 23 mg/dL   Creat 4.40  1.02 - 7.25 mg/dL   Total Bilirubin 0.4  0.3 - 1.2 mg/dL   Alkaline Phosphatase 54  39 - 117 U/L   AST 19  0 - 37 U/L   ALT 17  0 - 53 U/L   Total Protein 7.1  6.0 - 8.3 g/dL   Albumin 4.6  3.5 - 5.2 g/dL   Calcium 9.3  8.4 - 36.6 mg/dL  LIPID PANEL      Result Value Range   Cholesterol 159  0 - 200 mg/dL   Triglycerides 76  <440 mg/dL   HDL 53  >34 mg/dL   Total CHOL/HDL Ratio 3.0     VLDL 15  0 - 40 mg/dL   LDL Cholesterol 91  0 - 99 mg/dL  POCT CBC      Result Value Range   WBC 4.4 (*) 4.6 - 10.2 K/uL   Lymph, poc 1.1  0.6 - 3.4   POC LYMPH PERCENT 25.9  10 - 50 %L   MID (cbc) 0.3  0 - 0.9   POC MID % 6.1  0 - 12 %M   POC Granulocyte 3.0  2 - 6.9   Granulocyte percent 68.0  37 - 80 %G   RBC 5.30  4.69 - 6.13 M/uL   Hemoglobin 14.7  14.1  - 18.1 g/dL   HCT, POC 74.2  59.5 - 53.7 %   MCV 87.6  80 - 97 fL   MCH, POC 27.7  27 - 31.2 pg   MCHC 31.7 (*) 31.8 - 35.4 g/dL   RDW, POC 63.8     Platelet Count, POC 187  142 - 424 K/uL   MPV 9.5  0 - 99.8 fL  POCT INFLUENZA A/B      Result Value Range   Influenza A, POC Negative     Influenza B, POC Negative    GLUCOSE, POCT (MANUAL RESULT ENTRY)      Result Value Range   POC Glucose 156 (*) 70 - 99 mg/dl  POCT GLYCOSYLATED HEMOGLOBIN (  HGB A1C)      Result Value Range   Hemoglobin A1C 7.4       Review of Systems  Constitutional: Negative for fatigue and unexpected weight change.  Eyes: Negative for visual disturbance.  Respiratory: Negative for cough, chest tightness and shortness of breath.   Cardiovascular: Negative for chest pain, palpitations and leg swelling.  Gastrointestinal: Negative for abdominal pain and blood in stool.  Neurological: Negative for dizziness, light-headedness and headaches.   13 point review of systems per patient health survey noted.  Negative other than as indicated on reviewed nursing note or above.      Objective:   Physical Exam  Vitals reviewed. Constitutional: He is oriented to person, place, and time. He appears well-developed and well-nourished.  HENT:  Head: Normocephalic and atraumatic.  Right Ear: External ear normal.  Left Ear: External ear normal.  Mouth/Throat: Oropharynx is clear and moist.  Eyes: Conjunctivae and EOM are normal. Pupils are equal, round, and reactive to light.  Neck: Normal range of motion. Neck supple. No thyromegaly present.  Cardiovascular: Normal rate, regular rhythm, normal heart sounds and intact distal pulses.   Pulmonary/Chest: Effort normal and breath sounds normal. No respiratory distress. He has no wheezes.  Abdominal: Soft. He exhibits no distension. There is no tenderness. Hernia confirmed negative in the right inguinal area and confirmed negative in the left inguinal area.  Genitourinary:   Declined DRE.  Will have PSa test. Testing and limitations discussed, understanding expressed.   Musculoskeletal: Normal range of motion. He exhibits no edema and no tenderness.  Lymphadenopathy:    He has no cervical adenopathy.  Neurological: He is alert and oriented to person, place, and time. He has normal reflexes.  Skin: Skin is warm and dry.  Psychiatric: He has a normal mood and affect. His behavior is normal.    EKG: sinus bradycardia., no acute findings or significant changes seen from 06/2010.   Results for orders placed in visit on 02/13/13  POCT GLYCOSYLATED HEMOGLOBIN (HGB A1C)      Result Value Range   Hemoglobin A1C 10.2    GLUCOSE, POCT (MANUAL RESULT ENTRY)      Result Value Range   POC Glucose 133 (*) 70 - 99 mg/dl  POCT URINALYSIS DIPSTICK      Result Value Range   Color, UA yellow     Clarity, UA clear     Glucose, UA neg     Bilirubin, UA neg     Ketones, UA neg     Spec Grav, UA 1.010     Blood, UA neg     pH, UA 6.0     Protein, UA neg     Urobilinogen, UA 0.2     Nitrite, UA neg     Leukocytes, UA Negative        Assessment & Plan:  Marcus Hart is a 47 y.o. male Routine general medical examination at a health care facility - Plan: Comprehensive metabolic panel, Lipid panel, POCT glycosylated hemoglobin (Hb A1C), POCT glucose (manual entry), Microalbumin, urine, CBC, PSA, TSH, EKG 12-Lead, POCT urinalysis dipstick  Need for prophylactic vaccination and inoculation against influenza - Plan: Flu Vaccine QUAD 36+ mos IM  DM2 (diabetes mellitus, type 2) - Plan: POCT glycosylated hemoglobin (Hb A1C), POCT glucose (manual entry)  Other and unspecified hyperlipidemia - Plan: Comprehensive metabolic panel  Prostate cancer screening - Plan: PSA  Family history of bladder cancer - Plan: Microalbumin, urine  Need for Tdap vaccination -  Plan: Tdap vaccine greater than or equal to 7yo IM  DM (diabetes mellitus), type 2, uncontrolled - Plan: lisinopril  (PRINIVIL,ZESTRIL) 2.5 MG tablet, metFORMIN (GLUCOPHAGE) 1000 MG tablet, saxagliptin HCl (ONGLYZA) 5 MG TABS tablet, rosuvastatin (CRESTOR) 10 MG tablet  Hyperlipidemia LDL goal < 100 - Plan: rosuvastatin (CRESTOR) 10 MG tablet   CPE -  Anticipatory guidance given as below. Labs above. Declined DRE, but will check PSA.  Flu and tetanus vaccine.   DM2- uncontrolled.  Denies missed meds, but had been noncompliant with diet.   Reports improved control past few weeks, as low as 79. Plan on checking blood sugar 2-3x day at various times, and recheck in next 4-6 weeks to determine dose changes as reportedly recent blood sugars under 100.   Hyperlipidemia - tolerating statin. Lipids pending.   Meds ordered this encounter  Medications  . lisinopril (PRINIVIL,ZESTRIL) 2.5 MG tablet    Sig: Take 1 tablet (2.5 mg total) by mouth daily.    Dispense:  90 tablet    Refill:  1    Order Specific Question:  Supervising Provider    Answer:  Ethelda Chick [[id number]]  . metFORMIN (GLUCOPHAGE) 1000 MG tablet    Sig: Take 1 tablet (1,000 mg total) by mouth 2 (two) times daily with a meal.    Dispense:  180 tablet    Refill:  1    Order Specific Question:  Supervising Provider    Answer:  Ethelda Chick [[id number]]  . saxagliptin HCl (ONGLYZA) 5 MG TABS tablet    Sig: Take 0.5 tablets (2.5 mg total) by mouth daily.    Dispense:  90 tablet    Refill:  1  . rosuvastatin (CRESTOR) 10 MG tablet    Sig: Take 1 tablet (10 mg total) by mouth daily.    Dispense:  90 tablet    Refill:  1    Order Specific Question:  Supervising Provider    Answer:  Ethelda Chick [[id number]]   Patient Instructions  You should receive a call or letter about your lab results within the next week to 10 days.  Check your blood sugars 2-3 times per day, and bring record of these to office visit in next 4-6 weeks.  Return to the clinic or go to the nearest emergency room if any of your symptoms worsen or new symptoms occur.   Keeping  you healthy  Get these tests  Blood pressure- Have your blood pressure checked once a year by your healthcare provider.  Normal blood pressure is 120/80.  Weight- Have your body mass index (BMI) calculated to screen for obesity.  BMI is a measure of body fat based on height and weight. You can also calculate your own BMI at https://www.west-esparza.com/.  Cholesterol- Have your cholesterol checked regularly starting at age 92, sooner may be necessary if you have diabetes, high blood pressure, if a family member developed heart diseases at an early age or if you smoke.   Chlamydia, HIV, and other sexual transmitted disease- Get screened each year until the age of 61 then within three months of each new sexual partner.  Diabetes- Have your blood sugar checked regularly if you have high blood pressure, high cholesterol, a family history of diabetes or if you are overweight.  Get these vaccines  Flu shot- Every fall.  Tetanus shot- Every 10 years.  Menactra- Single dose; prevents meningitis.  Take these steps  Don't smoke- If you do smoke, ask your healthcare provider about  quitting. For tips on how to quit, go to www.smokefree.gov or call 1-800-QUIT-NOW.  Be physically active- Exercise 5 days a week for at least 30 minutes.  If you are not already physically active start slow and gradually work up to 30 minutes of moderate physical activity.  Examples of moderate activity include walking briskly, mowing the yard, dancing, swimming bicycling, etc.  Eat a healthy diet- Eat a variety of healthy foods such as fruits, vegetables, low fat milk, low fat cheese, yogurt, lean meats, poultry, fish, beans, tofu, etc.  For more information on healthy eating, go to www.thenutritionsource.org  Drink alcohol in moderation- Limit alcohol intake two drinks or less a day.  Never drink and drive.  Dentist- Brush and floss teeth twice daily; visit your dentis twice a year.  Depression-Your emotional health is  as important as your physical health.  If you're feeling down, losing interest in things you normally enjoy please talk with your healthcare provider.  Gun Safety- If you keep a gun in your home, keep it unloaded and with the safety lock on.  Bullets should be stored separately.  Helmet use- Always wear a helmet when riding a motorcycle, bicycle, rollerblading or skateboarding.  Safe sex- If you may be exposed to a sexually transmitted infection, use a condom  Seat belts- Seat bels can save your life; always wear one.  Smoke/Carbon Monoxide detectors- These detectors need to be installed on the appropriate level of your home.  Replace batteries at least once a year.  Skin Cancer- When out in the sun, cover up and use sunscreen SPF 15 or higher.  Violence- If anyone is threatening or hurting you, please tell your healthcare provider.

## 2013-02-14 LAB — MICROALBUMIN, URINE: Microalb, Ur: 0.5 mg/dL (ref 0.00–1.89)

## 2013-06-23 ENCOUNTER — Ambulatory Visit (INDEPENDENT_AMBULATORY_CARE_PROVIDER_SITE_OTHER): Payer: 59 | Admitting: Internal Medicine

## 2013-06-23 VITALS — BP 134/80 | HR 64 | Temp 97.9°F | Resp 16 | Ht 71.0 in | Wt 176.0 lb

## 2013-06-23 DIAGNOSIS — S39012A Strain of muscle, fascia and tendon of lower back, initial encounter: Secondary | ICD-10-CM

## 2013-06-23 DIAGNOSIS — S335XXA Sprain of ligaments of lumbar spine, initial encounter: Secondary | ICD-10-CM

## 2013-06-23 MED ORDER — CYCLOBENZAPRINE HCL 5 MG PO TABS: 5.0000 mg | ORAL_TABLET | Freq: Two times a day (BID) | ORAL | Status: DC | PRN

## 2013-06-23 MED ORDER — MELOXICAM 15 MG PO TABS: 15.0000 mg | ORAL_TABLET | Freq: Every day | ORAL | Status: DC

## 2013-06-23 NOTE — Progress Notes (Signed)
Subjective:    Patient I.D: Marcus Hart, male    DOB: 1965/06/07, 48 y.o.   MRN: [id number] This chart was scribed for Tami Lin, MD by Anastasia Pall, ED Scribe. This patient was seen in room 01 and the patient's care was started at 5:40 PM.  Chief Complaint  Patient presents with   Back Pain    x 2 days    HPI Marcus Hart is a 47 y.o. male Pt presents with intermittent, mid back pain, onset 2 days ago. He reports that bending over exacerbates his back pain. He reports that standing up straight and rotating his trunk exacerbates his pain a little, but not as much as bending over. He reports his back is fine when not working, but states his back pain returns whenever he is working. He reports having imaging done, resulting in negative results. He denies any other symptoms.   PCP - No primary provider on file.  Patient Active Problem List   Diagnosis Date Noted   DM2 (diabetes mellitus, type 2) 02/13/2013   Other and unspecified hyperlipidemia 02/13/2013   Prior to Admission medications   Medication Sig Start Date End Date Taking? Authorizing Provider  albuterol (PROVENTIL HFA;VENTOLIN HFA) 108 (90 BASE) MCG/ACT inhaler Inhale 2 puffs into the lungs 4 (four) times daily. 07/22/12  Yes Harden Mo, MD  aspirin 81 MG tablet Take 81 mg by mouth daily.   Yes Historical Provider, MD  lisinopril (PRINIVIL,ZESTRIL) 2.5 MG tablet Take 1 tablet (2.5 mg total) by mouth daily. 02/13/13  Yes [name], MD  metFORMIN (GLUCOPHAGE) 1000 MG tablet Take 1 tablet (1,000 mg total) by mouth 2 (two) times daily with a meal. 02/13/13 02/13/14 Yes [name], MD  rosuvastatin (CRESTOR) 10 MG tablet Take 1 tablet (10 mg total) by mouth daily. 02/13/13  Yes [name], MD  saxagliptin HCl (ONGLYZA) 5 MG TABS tablet Take 0.5 tablets (2.5 mg total) by mouth daily. 02/13/13  Yes [name], MD  beclomethasone (QVAR) 80 MCG/ACT inhaler Inhale 2 puffs into the lungs 2 (two) times  daily. 07/22/12   Harden Mo, MD  chlorpheniramine-HYDROcodone (TUSSIONEX) 10-8 MG/5ML LQCR Take 5 mLs by mouth every 12 (twelve) hours as needed. 07/22/12   Harden Mo, MD  cyclobenzaprine (FLEXERIL) 5 MG tablet Take 1 tablet (5 mg total) by mouth 2 (two) times daily as needed for muscle spasms. 08/03/12   Wardell Honour, MD  doxycycline (VIBRA-TABS) 100 MG tablet Take 1 tablet (100 mg total) by mouth 2 (two) times daily. 07/22/12   Harden Mo, MD  HYDROcodone-homatropine [facility]) 5-1.5 MG/5ML syrup 1 TSP PO Q 4-6 HOURS PRN COUGH 05/05/12   Rise Mu, PA-C  levofloxacin (LEVAQUIN) 500 MG tablet Take 1 tablet (500 mg total) by mouth daily. 05/23/12   Thao P Le, DO  naproxen (NAPROSYN) 500 MG tablet Take 1 tablet (500 mg total) by mouth 2 (two) times daily with a meal. 08/03/12   Wardell Honour, MD   Review of Systems  Constitutional: Negative for fever.  Musculoskeletal: Positive for back pain (mid, muscular). Negative for arthralgias.      Objective:   Physical Exam  Nursing note and vitals reviewed. Constitutional: He is oriented to person, place, and time. He appears well-developed and well-nourished. No distress.  HENT:  Head: Normocephalic and atraumatic.  Eyes: EOM are normal.  Neck: Neck supple.  Cardiovascular: Normal rate.   Pulmonary/Chest: Effort normal. No respiratory distress.  Musculoskeletal: Normal range of motion. He exhibits tenderness.       Thoracic back: He exhibits tenderness. He exhibits normal range of motion, no bony tenderness, no swelling and no deformity.  Neurological: He is alert and oriented to person, place, and time.  Skin: Skin is warm and dry.  Psychiatric: He has a normal mood and affect. His behavior is normal.   BP 134/80   Pulse 64   Temp(Src) 97.9 F (36.6 C) (Oral)   Resp 16   Ht 5\' 11"  (1.803 m)   Wt 176 lb (79.833 kg)   BMI 24.56 kg/m2   SpO2 99%     Assessment & Plan:   Lumbar strain - Plan: cyclobenzaprine (FLEXERIL) 5 MG tablet,  meloxicam (MOBIC) 15 MG tablet  Meds ordered this encounter  Medications   cyclobenzaprine (FLEXERIL) 5 MG tablet    Sig: Take 1 tablet (5 mg total) by mouth 2 (two) times daily as needed for muscle spasms. Especially at hs    Dispense:  60 tablet    Refill:  0   meloxicam (MOBIC) 15 MG tablet    Sig: Take 1 tablet (15 mg total) by mouth daily.    Dispense:  30 tablet    Refill:  1   exercises from lumbar book Exercises given///this is a chronic recurrent problem related to lifting at work and lack of core strength If he fails this therapy will need physical therapy and work hardening     I have completed the patient encounter in its entirety as documented by the scribe, with editing by me where necessary. Robert P. Laney Pastor, M.D.

## 2013-06-23 NOTE — Patient Instructions (Signed)
Consider using a back brace for work-you may obtain one of these at your pharmacy or they may offer them at your worksite if you ask human resources

## 2013-07-05 ENCOUNTER — Ambulatory Visit (INDEPENDENT_AMBULATORY_CARE_PROVIDER_SITE_OTHER): Payer: 59 | Admitting: Family Medicine

## 2013-07-05 VITALS — BP 116/58 | HR 71 | Temp 97.8°F | Resp 16 | Ht 71.0 in | Wt 176.0 lb

## 2013-07-05 DIAGNOSIS — E785 Hyperlipidemia, unspecified: Secondary | ICD-10-CM

## 2013-07-05 DIAGNOSIS — M545 Low back pain, unspecified: Secondary | ICD-10-CM

## 2013-07-05 DIAGNOSIS — IMO0001 Reserved for inherently not codable concepts without codable children: Secondary | ICD-10-CM

## 2013-07-05 DIAGNOSIS — E1165 Type 2 diabetes mellitus with hyperglycemia: Secondary | ICD-10-CM

## 2013-07-05 DIAGNOSIS — IMO0002 Reserved for concepts with insufficient information to code with codable children: Secondary | ICD-10-CM

## 2013-07-05 LAB — POCT GLYCOSYLATED HEMOGLOBIN (HGB A1C): HEMOGLOBIN A1C: 8

## 2013-07-05 LAB — GLUCOSE, POCT (MANUAL RESULT ENTRY): POC GLUCOSE: 113 mg/dL — AB (ref 70–99)

## 2013-07-05 MED ORDER — SAXAGLIPTIN HCL 5 MG PO TABS: 5.0000 mg | ORAL_TABLET | Freq: Every day | ORAL | Status: DC

## 2013-07-05 NOTE — Patient Instructions (Addendum)
I will write a note for you to be out of work tonight for your back, and tomorrow if needed, but if you are not able to return to work due to pain in your back in 2 days - need to recheck for repeat exam.  Increase your Onglyza to 5mg  each day (one full pill), but watch for low blood sugar symptoms.  Recheck in 3 months for repeat blood sugar test, or sooner if any continued back pain or new/worsening symptoms.

## 2013-07-05 NOTE — Progress Notes (Addendum)
Subjective:   This chart was scribed for Marcus Ray, MD by Forrestine Him, [facility] Scribe. This patient was seen in room 10 and the patient's care was started 5:35 PM.    Patient ID: Marcus Hart, male    DOB: February 18, 1966, 48 y.o.   MRN: [id number]  HPI HPI Comments: Marcus Hart is a 48 y.o. male with a PMHx of Diabetes Mellitus Type 2 who presents to [facility] seeking a diabetes check today. Pt was last seen 02/13/13 for a complete physical. At that time, pts diabetes was uncontrolled with A1C at 10.2. Pt reported decreased control of diet. Continued same prescribed medications as planned, and dicussed plans to follow up for recheck in 4-6 weeks. Today pt reports a blood sugar of 143 this morning, and reports a ranged from 110 to 180 in the last month. Pt denies any intentional physical activity at this time. Denies any new side effects. He is currently taking Metformin 1000 mg, Crestor 10 mg, and Onglyza 5 mg as prescribed and directed. Pts last meal was at 8 AM this morning, and he has come to this visit today fasting.  Pt was seen 12 days ago for low back strain. Pt has noted improvement in his back discomfort since onset, but is requesting a note to excuse him from work. Pt has only worked 3 days in a row after being seen by Dr. Laney Pastor. He states he is not quite ready to complete a full week of work secondary to his pain just yet.    PCP: No primary provider on file.   Patient Active Problem List   Diagnosis Date Noted  . DM2 (diabetes mellitus, type 2) 02/13/2013  . Other and unspecified hyperlipidemia 02/13/2013   Past Medical History  Diagnosis Date  . Diabetes mellitus   . Hyperlipidemia    History reviewed. No pertinent past surgical history. No Known Allergies Prior to Admission medications   Medication Sig Start Date End Date Taking? Authorizing Provider  albuterol (PROVENTIL HFA;VENTOLIN HFA) 108 (90 BASE) MCG/ACT inhaler  Inhale 2 puffs into the lungs 4 (four) times daily. 07/22/12  Yes Harden Mo, MD  aspirin 81 MG tablet Take 81 mg by mouth daily.   Yes Historical Provider, MD  lisinopril (PRINIVIL,ZESTRIL) 2.5 MG tablet Take 1 tablet (2.5 mg total) by mouth daily. 02/13/13  Yes [name], MD  meloxicam (MOBIC) 15 MG tablet Take 1 tablet (15 mg total) by mouth daily. 06/23/13  Yes Leandrew Koyanagi, MD  metFORMIN (GLUCOPHAGE) 1000 MG tablet Take 1 tablet (1,000 mg total) by mouth 2 (two) times daily with a meal. 02/13/13 02/13/14 Yes [name], MD  naproxen (NAPROSYN) 500 MG tablet Take 1 tablet (500 mg total) by mouth 2 (two) times daily with a meal. 08/03/12  Yes Wardell Honour, MD  rosuvastatin (CRESTOR) 10 MG tablet Take 1 tablet (10 mg total) by mouth daily. 02/13/13  Yes [name], MD  saxagliptin HCl (ONGLYZA) 5 MG TABS tablet Take 0.5 tablets (2.5 mg total) by mouth daily. 02/13/13  Yes [name], MD  beclomethasone (QVAR) 80 MCG/ACT inhaler Inhale 2 puffs into the lungs 2 (two) times daily. 07/22/12   Harden Mo, MD  chlorpheniramine-HYDROcodone (TUSSIONEX) 10-8 MG/5ML LQCR Take 5 mLs by mouth every 12 (twelve) hours as needed. 07/22/12   Harden Mo, MD  cyclobenzaprine (FLEXERIL) 5 MG tablet Take 1 tablet (5 mg total) by mouth 2 (  two) times daily as needed for muscle spasms. Especially at hs 06/23/13   Leandrew Koyanagi, MD  doxycycline (VIBRA-TABS) 100 MG tablet Take 1 tablet (100 mg total) by mouth 2 (two) times daily. 07/22/12   Harden Mo, MD  HYDROcodone-homatropine [facility]) 5-1.5 MG/5ML syrup 1 TSP PO Q 4-6 HOURS PRN COUGH 05/05/12   Rise Mu, PA-C  levofloxacin (LEVAQUIN) 500 MG tablet Take 1 tablet (500 mg total) by mouth daily. 05/23/12   Thao P Le, DO   History   Social History  . Marital Status: Married    Spouse Name: N/A    Number of Children: N/A  . Years of Education: N/A   Occupational History  . Not on file.   Social History Main Topics  . Smoking  status: Never Smoker   . Smokeless tobacco: Not on file  . Alcohol Use: No  . Drug Use: No  . Sexual Activity: Yes   Other Topics Concern  . Not on file   Social History Narrative  . No narrative on file     Review of Systems  Constitutional: Negative for fatigue and unexpected weight change.  Eyes: Negative for visual disturbance.  Respiratory: Negative for cough, chest tightness and shortness of breath.   Cardiovascular: Negative for chest pain, palpitations and leg swelling.  Gastrointestinal: Negative for abdominal pain and blood in stool.  Musculoskeletal: Positive for back pain.  Neurological: Negative for dizziness, light-headedness and headaches.    Filed Vitals:   07/05/13 1605  BP: 98/70  Pulse: 71  Temp: 97.8 F (36.6 C)  TempSrc: Oral  Resp: 16  Height: 5\' 11"  (1.803 m)  Weight: 176 lb (79.833 kg)  SpO2: 99%    Objective:  Physical Exam  Vitals reviewed. Constitutional: He is oriented to person, place, and time. He appears well-developed and well-nourished.  HENT:  Head: Normocephalic and atraumatic.  Eyes: EOM are normal. Pupils are equal, round, and reactive to light.  Neck: No JVD present. Carotid bruit is not present.  Cardiovascular: Normal rate, regular rhythm and normal heart sounds.   No murmur heard. Pulmonary/Chest: Effort normal and breath sounds normal. He has no rales.  Musculoskeletal: He exhibits tenderness. He exhibits no edema.  Minimal tenderness to lower LS spine Paraspinal muscles non tender  Neurological: He is alert and oriented to person, place, and time.  Micro filament testing intact at toes  Skin: Skin is warm and dry.  No skin break down on feet  Psychiatric: He has a normal mood and affect.   Results for orders placed in visit on 07/05/13  GLUCOSE, POCT (MANUAL RESULT ENTRY)      Result Value Ref Range   POC Glucose 113 (*) 70 - 99 mg/dl  POCT GLYCOSYLATED HEMOGLOBIN (HGB A1C)      Result Value Ref Range   Hemoglobin  A1C 8.0         Assessment & Plan:    Marcus Hart is a 48 y.o. male Diabetes mellitus type II, uncontrolled - Plan: POCT glucose (manual entry), POCT glycosylated hemoglobin (Hb A1C), Comprehensive metabolic panel - decreased control. Increase Onglyza to 5mg  qd.   Hyperlipemia - Plan: Comprehensive metabolic panel, Lipid panel - labs pending. Cont Crestor 10mg  qd.   Low back pain - clarified history and pain is improving, but concern of returning to full duty at work too soon. Will write out for few more days, but recheck if not improving in next few days.   Meds ordered this  encounter  Medications  . saxagliptin HCl (ONGLYZA) 5 MG TABS tablet    Sig: Take 1 tablet (5 mg total) by mouth daily.    Dispense:  90 tablet    Refill:  0   Patient Instructions  I will write a note for you to be out of work tonight for your back, and tomorrow if needed, but if you are not able to return to work due to pain in your back in 2 days - need to recheck for repeat exam.  Increase your Onglyza to 5mg  each day (one full pill), but watch for low blood sugar symptoms.  Recheck in 3 months for repeat blood sugar test, or sooner if any continued back pain or new/worsening symptoms.      I personally performed the services described in this documentation, which was scribed in my presence. The recorded information has been reviewed and considered, and addended by me as needed.

## 2013-07-06 ENCOUNTER — Telehealth: Payer: Self-pay | Admitting: Family Medicine

## 2013-07-06 LAB — LIPID PANEL
CHOLESTEROL: 185 mg/dL (ref 0–200)
HDL: 67 mg/dL (ref >39)
LDL Cholesterol: 93 mg/dL (ref 0–99)
Total CHOL/HDL Ratio: 2.8 Ratio
Triglycerides: 127 mg/dL (ref <150)
VLDL: 25 mg/dL (ref 0–40)

## 2013-07-06 LAB — COMPREHENSIVE METABOLIC PANEL
ALBUMIN: 5 g/dL (ref 3.5–5.2)
ALT: 21 U/L (ref 0–53)
AST: 19 U/L (ref 0–37)
Alkaline Phosphatase: 44 U/L (ref 39–117)
BUN: 11 mg/dL (ref 6–23)
CALCIUM: 9.8 mg/dL (ref 8.4–10.5)
CHLORIDE: 100 meq/L (ref 96–112)
CO2: 28 meq/L (ref 19–32)
Creat: 0.81 mg/dL (ref 0.50–1.35)
Glucose, Bld: 115 mg/dL — ABNORMAL HIGH (ref 70–99)
POTASSIUM: 4.5 meq/L (ref 3.5–5.3)
SODIUM: 138 meq/L (ref 135–145)
TOTAL PROTEIN: 7.5 g/dL (ref 6.0–8.3)
Total Bilirubin: 0.6 mg/dL (ref 0.2–1.2)

## 2013-07-06 NOTE — Telephone Encounter (Signed)
Called and left vm for patient to give [place] a call back to schedule a 3 mo fu in May for Doctor Carlota Raspberry  For a diabetes re-check

## 2013-07-10 ENCOUNTER — Encounter: Payer: Self-pay | Admitting: *Deleted

## 2013-08-15 ENCOUNTER — Encounter: Payer: Self-pay | Admitting: Family Medicine

## 2013-08-30 ENCOUNTER — Other Ambulatory Visit: Payer: Self-pay | Admitting: Family Medicine

## 2013-09-04 ENCOUNTER — Other Ambulatory Visit: Payer: Self-pay

## 2013-09-04 MED ORDER — ROSUVASTATIN CALCIUM 10 MG PO TABS: 10.0000 mg | ORAL_TABLET | Freq: Every day | ORAL | Status: DC

## 2013-09-04 NOTE — Telephone Encounter (Signed)
Received request for refill. 

## 2013-10-02 ENCOUNTER — Ambulatory Visit (INDEPENDENT_AMBULATORY_CARE_PROVIDER_SITE_OTHER): Payer: 59 | Admitting: Family Medicine

## 2013-10-02 ENCOUNTER — Encounter: Payer: Self-pay | Admitting: Family Medicine

## 2013-10-02 VITALS — BP 120/70 | HR 66 | Temp 98.1°F | Resp 16 | Ht 71.0 in | Wt 172.6 lb

## 2013-10-02 DIAGNOSIS — IMO0002 Reserved for concepts with insufficient information to code with codable children: Secondary | ICD-10-CM

## 2013-10-02 DIAGNOSIS — E1165 Type 2 diabetes mellitus with hyperglycemia: Secondary | ICD-10-CM

## 2013-10-02 DIAGNOSIS — E785 Hyperlipidemia, unspecified: Secondary | ICD-10-CM

## 2013-10-02 DIAGNOSIS — E119 Type 2 diabetes mellitus without complications: Secondary | ICD-10-CM

## 2013-10-02 DIAGNOSIS — IMO0001 Reserved for inherently not codable concepts without codable children: Secondary | ICD-10-CM

## 2013-10-02 LAB — GLUCOSE, POCT (MANUAL RESULT ENTRY): POC GLUCOSE: 134 mg/dL — AB (ref 70–99)

## 2013-10-02 LAB — POCT GLYCOSYLATED HEMOGLOBIN (HGB A1C): HEMOGLOBIN A1C: 7.2

## 2013-10-02 MED ORDER — LISINOPRIL 2.5 MG PO TABS: ORAL_TABLET | ORAL | Status: DC

## 2013-10-02 MED ORDER — ROSUVASTATIN CALCIUM 10 MG PO TABS: 10.0000 mg | ORAL_TABLET | Freq: Every day | ORAL | Status: DC

## 2013-10-02 MED ORDER — SAXAGLIPTIN HCL 5 MG PO TABS: 5.0000 mg | ORAL_TABLET | Freq: Every day | ORAL | Status: DC

## 2013-10-02 MED ORDER — METFORMIN HCL 1000 MG PO TABS: 1000.0000 mg | ORAL_TABLET | Freq: Two times a day (BID) | ORAL | Status: DC

## 2013-10-02 NOTE — Patient Instructions (Addendum)
Exercise 150 minutes per week. Your diabetes control is improving.   Recheck in 3 months.

## 2013-10-02 NOTE — Progress Notes (Addendum)
Subjective:   This chart was scribed for [name], MD, by Neta Ehlers, ED Scribe. This  patient's care was started at 3:06 PM.    Patient ID: Marcus Hart, male    DOB: [birth year], 48 y.o.   MRN: [id number]  Chief Complaint  Patient presents with  . Diabetes  . Medication Refill    Lisinopril 2.5, Metformin 1000mg , Crestor 10, Saxagliptin 5 mg    HPI  Marcus Hart is a 48 y.o. male who presents to [facility] for a diabetes recheck. Mr. Beougher has a h/o diabetes. Last A1C was three months ago; it was 8.0. Increased Onglyza to 5 mg once a day. Continue Metformin 1000 mg twice a day. Continue lisinopril 2.5 mg Qd. Continue Crestor 10 mg  twice a day.  Normal urine micro-albumin in September 2014. Lipid panel in February with LDL 93.   The pt reports his CBGS have been good stating they are normally in the 120s and 130s. The highest was 160 while the lowest was 80. He denies any symptomatic lows. He also denies any missed medications.   He denies any tarry stools, light-headedness, cough, headaches, or dizziness. He denies any issues with his toes.   Mr. Boomer takes 81 mg aspirin once a day.    The pt reports he is moving into a new home next month and will be able to exercise more regularly once the move is complete.   Mr. Belay has four children: two girls and two boys.   PCP: No primary provider on file.  Patient Active Problem List   Diagnosis Date Noted  . DM2 (diabetes mellitus, type 2) 02/13/2013  . Other and unspecified hyperlipidemia 02/13/2013   Past Medical History  Diagnosis Date  . Diabetes mellitus   . Hyperlipidemia    No past surgical history on file.  No Known Allergies  Prior to Admission medications   Medication Sig Start Date End Date Taking? Authorizing Provider  aspirin 81 MG tablet Take 81 mg by mouth daily.   Yes Historical Provider, MD  lisinopril (PRINIVIL,ZESTRIL) 2.5 MG tablet TAKE 1 TABLET BY MOUTH EVERY DAY 08/30/13  Yes [name], MD  metFORMIN (GLUCOPHAGE) 1000 MG tablet Take 1 tablet (1,000 mg total) by mouth 2 (two) times daily with a meal. 02/13/13 02/13/14 Yes [name], MD  rosuvastatin (CRESTOR) 10 MG tablet Take 1 tablet (10 mg total) by mouth daily. 09/04/13  Yes [name], MD  saxagliptin HCl (ONGLYZA) 5 MG TABS tablet Take 1 tablet (5 mg total) by mouth daily. 07/05/13  Yes [name], MD   Review of Systems  Constitutional: Negative for fatigue and unexpected weight change.  Eyes: Negative for visual disturbance.  Respiratory: Negative for cough, chest tightness and shortness of breath.   Cardiovascular: Negative for chest pain, palpitations and leg swelling.  Gastrointestinal: Negative for abdominal pain and blood in stool.  Neurological: Negative for dizziness, light-headedness and headaches.       Objective:   Physical Exam  Vitals reviewed. Constitutional: He is oriented to person, place, and time. He appears well-developed and well-nourished.  HENT:  Head: Normocephalic and atraumatic.  Eyes: EOM are normal. Pupils are equal, round, and reactive to light.  Neck: No JVD present. Carotid bruit is not present.  Cardiovascular: Normal rate, regular rhythm and normal heart sounds.   No murmur heard. Pulmonary/Chest: Effort normal and breath sounds normal. He has no rales.  Musculoskeletal: He exhibits no edema.  Neurological:  He is alert and oriented to person, place, and time.  Skin: Skin is warm and dry.  Psychiatric: He has a normal mood and affect.     Filed Vitals:   10/02/13 1436  BP: 120/70  Pulse: 66  Temp: 98.1 F (36.7 C)  TempSrc: Oral  Resp: 16  Height: 5\' 11"  (1.803 m)  Weight: 172 lb 9.6 oz (78.291 kg)  SpO2: 100%   Results for orders placed in visit on 10/02/13  GLUCOSE, POCT (MANUAL RESULT ENTRY)      Result Value Ref Range   POC Glucose 134 (*) 70 - 99 mg/dl  POCT GLYCOSYLATED HEMOGLOBIN (HGB A1C)      Result Value Ref Range   Hemoglobin  A1C 7.2         Assessment & Plan:   Zaidin Rota is a 48 y.o. male  DM (diabetes mellitus), type 2, uncontrolled - Plan: saxagliptin HCl (ONGLYZA) 5 MG TABS tablet, rosuvastatin (CRESTOR) 10 MG tablet, metFORMIN (GLUCOPHAGE) 1000 MG tablet, lisinopril (PRINIVIL,ZESTRIL) 2.5 MG tablet  - improved and near control.  With planned increase in exercise, should be at goal, but also discusse that A1c under 6.5 would be reasonable as long as not running too low.  No change in medication regimen. Refilled ACE-I and statin. Cont asa qd.   Other and unspecified hyperlipidemia - goal LDL under 70 with DM2.  Cont crestor at same dose for now. Work on exercise and diet. Recheck in 3 months.    Meds ordered this encounter  Medications  . saxagliptin HCl (ONGLYZA) 5 MG TABS tablet    Sig: Take 1 tablet (5 mg total) by mouth daily.    Dispense:  90 tablet    Refill:  0  . rosuvastatin (CRESTOR) 10 MG tablet    Sig: Take 1 tablet (10 mg total) by mouth daily.    Dispense:  30 tablet    Refill:  0  . metFORMIN (GLUCOPHAGE) 1000 MG tablet    Sig: Take 1 tablet (1,000 mg total) by mouth 2 (two) times daily with a meal.    Dispense:  180 tablet    Refill:  1    Order Specific Question:  Supervising Provider    Answer:  Wardell Honour [[id number]]  . lisinopril (PRINIVIL,ZESTRIL) 2.5 MG tablet    Sig: TAKE 1 TABLET BY MOUTH EVERY DAY    Dispense:  90 tablet    Refill:  0   Patient Instructions  Exercise 150 minutes per week. Your diabetes control is improving.   Recheck in 3 months.    I personally performed the services described in this documentation, which was scribed in my presence. The recorded information has been reviewed and considered, and addended by me as needed.

## 2014-01-01 ENCOUNTER — Ambulatory Visit (INDEPENDENT_AMBULATORY_CARE_PROVIDER_SITE_OTHER): Payer: 59 | Admitting: Family Medicine

## 2014-01-01 ENCOUNTER — Encounter: Payer: Self-pay | Admitting: Family Medicine

## 2014-01-01 VITALS — BP 130/80 | HR 64 | Temp 98.4°F | Resp 16 | Ht 71.0 in | Wt 170.0 lb

## 2014-01-01 DIAGNOSIS — M25819 Other specified joint disorders, unspecified shoulder: Secondary | ICD-10-CM

## 2014-01-01 DIAGNOSIS — M7542 Impingement syndrome of left shoulder: Secondary | ICD-10-CM

## 2014-01-01 DIAGNOSIS — M25512 Pain in left shoulder: Secondary | ICD-10-CM

## 2014-01-01 DIAGNOSIS — M7580 Other shoulder lesions, unspecified shoulder: Secondary | ICD-10-CM

## 2014-01-01 DIAGNOSIS — IMO0002 Reserved for concepts with insufficient information to code with codable children: Secondary | ICD-10-CM

## 2014-01-01 DIAGNOSIS — M25519 Pain in unspecified shoulder: Secondary | ICD-10-CM

## 2014-01-01 DIAGNOSIS — E119 Type 2 diabetes mellitus without complications: Secondary | ICD-10-CM

## 2014-01-01 DIAGNOSIS — E1165 Type 2 diabetes mellitus with hyperglycemia: Secondary | ICD-10-CM

## 2014-01-01 DIAGNOSIS — IMO0001 Reserved for inherently not codable concepts without codable children: Secondary | ICD-10-CM

## 2014-01-01 LAB — GLUCOSE, POCT (MANUAL RESULT ENTRY): POC GLUCOSE: 138 mg/dL — AB (ref 70–99)

## 2014-01-01 LAB — POCT GLYCOSYLATED HEMOGLOBIN (HGB A1C): Hemoglobin A1C: 6.7

## 2014-01-01 MED ORDER — LISINOPRIL 2.5 MG PO TABS: ORAL_TABLET | ORAL | Status: DC

## 2014-01-01 NOTE — Patient Instructions (Addendum)
See handout on shoulder pain. If not improved in 2 - 3 weeks, or worse sooner - return to check further or possible injection. Ok to try tylenol and only occasional advil or alleve if needed.   You should receive a call or letter about your lab results within the next week to 10 days.   plan on recheck in next 3-6 months for diabetes.  Exercise 150mins/week as discussed.   Impingement Syndrome, Rotator Cuff, Bursitis with Rehab Impingement syndrome is a condition that involves inflammation of the tendons of the rotator cuff and the subacromial bursa, that causes pain in the shoulder. The rotator cuff consists of four tendons and muscles that control much of the shoulder and upper arm function. The subacromial bursa is a fluid filled sac that helps reduce friction between the rotator cuff and one of the bones of the shoulder (acromion). Impingement syndrome is usually an overuse injury that causes swelling of the bursa (bursitis), swelling of the tendon (tendonitis), and/or a tear of the tendon (strain). Strains are classified into three categories. Grade 1 strains cause pain, but the tendon is not lengthened. Grade 2 strains include a lengthened ligament, due to the ligament being stretched or partially ruptured. With grade 2 strains there is still function, although the function may be decreased. Grade 3 strains include a complete tear of the tendon or muscle, and function is usually impaired. SYMPTOMS   Pain around the shoulder, often at the outer portion of the upper arm.  Pain that gets worse with shoulder function, especially when reaching overhead or lifting.  Sometimes, aching when not using the arm.  Pain that wakes you up at night.  Sometimes, tenderness, swelling, warmth, or redness over the affected area.  Loss of strength.  Limited motion of the shoulder, especially reaching behind the back (to the back pocket or to unhook bra) or across your body.  Crackling sound (crepitation)  when moving the arm.  Biceps tendon pain and inflammation (in the front of the shoulder). Worse when bending the elbow or lifting. CAUSES  Impingement syndrome is often an overuse injury, in which chronic (repetitive) motions cause the tendons or bursa to become inflamed. A strain occurs when a force is paced on the tendon or muscle that is greater than it can withstand. Common mechanisms of injury include: Stress from sudden increase in duration, frequency, or intensity of training.  Direct hit (trauma) to the shoulder.  Aging, erosion of the tendon with normal use.  Bony bump on shoulder (acromial spur). RISK INCREASES WITH:  Contact sports (football, wrestling, boxing).  Throwing sports (baseball, tennis, volleyball).  Weightlifting and bodybuilding.  Heavy labor.  Previous injury to the rotator cuff, including impingement.  Poor shoulder strength and flexibility.  Failure to warm up properly before activity.  Inadequate protective equipment.  Old age.  Bony bump on shoulder (acromial spur). PREVENTION   Warm up and stretch properly before activity.  Allow for adequate recovery between workouts.  Maintain physical fitness:  Strength, flexibility, and endurance.  Cardiovascular fitness.  Learn and use proper exercise technique. PROGNOSIS  If treated properly, impingement syndrome usually goes away within 6 weeks. Sometimes surgery is required.  RELATED COMPLICATIONS   Longer healing time if not properly treated, or if not given enough time to heal.  Recurring symptoms, that result in a chronic condition.  Shoulder stiffness, frozen shoulder, or loss of motion.  Rotator cuff tendon tear.  Recurring symptoms, especially if activity is resumed too soon, with overuse,  with a direct blow, or when using poor technique. TREATMENT  Treatment first involves the use of ice and medicine, to reduce pain and inflammation. The use of strengthening and stretching  exercises may help reduce pain with activity. These exercises may be performed at home or with a therapist. If non-surgical treatment is unsuccessful after more than 6 months, surgery may be advised. After surgery and rehabilitation, activity is usually possible in 3 months.  MEDICATION  If pain medicine is needed, nonsteroidal anti-inflammatory medicines (aspirin and ibuprofen), or other minor pain relievers (acetaminophen), are often advised.  Do not take pain medicine for 7 days before surgery.  Prescription pain relievers may be given, if your caregiver thinks they are needed. Use only as directed and only as much as you need.  Corticosteroid injections may be given by your caregiver. These injections should be reserved for the most serious cases, because they may only be given a certain number of times. HEAT AND COLD  Cold treatment (icing) should be applied for 10 to 15 minutes every 2 to 3 hours for inflammation and pain, and immediately after activity that aggravates your symptoms. Use ice packs or an ice massage.  Heat treatment may be used before performing stretching and strengthening activities prescribed by your caregiver, physical therapist, or athletic trainer. Use a heat pack or a warm water soak. SEEK MEDICAL CARE IF:   Symptoms get worse or do not improve in 4 to 6 weeks, despite treatment.  New, unexplained symptoms develop. (Drugs used in treatment may produce side effects.) EXERCISES  RANGE OF MOTION (ROM) AND STRETCHING EXERCISES - Impingement Syndrome (Rotator Cuff  Tendinitis, Bursitis) These exercises may help you when beginning to rehabilitate your injury. Your symptoms may go away with or without further involvement from your physician, physical therapist or athletic trainer. While completing these exercises, remember:   Restoring tissue flexibility helps normal motion to return to the joints. This allows healthier, less painful movement and activity.  An  effective stretch should be held for at least 30 seconds.  A stretch should never be painful. You should only feel a gentle lengthening or release in the stretched tissue. STRETCH - Flexion, Standing  Stand with good posture. With an underhand grip on your right / left hand, and an overhand grip on the opposite hand, grasp a broomstick or cane so that your hands are a little more than shoulder width apart.  Keeping your right / left elbow straight and shoulder muscles relaxed, push the stick with your opposite hand, to raise your right / left arm in front of your body and then overhead. Raise your arm until you feel a stretch in your right / left shoulder, but before you have increased shoulder pain.  Try to avoid shrugging your right / left shoulder as your arm rises, by keeping your shoulder blade tucked down and toward your mid-back spine. Hold for __________ seconds.  Slowly return to the starting position. Repeat __________ times. Complete this exercise __________ times per day. STRETCH - Abduction, Supine  Lie on your back. With an underhand grip on your right / left hand and an overhand grip on the opposite hand, grasp a broomstick or cane so that your hands are a little more than shoulder width apart.  Keeping your right / left elbow straight and your shoulder muscles relaxed, push the stick with your opposite hand, to raise your right / left arm out to the side of your body and then overhead. Raise your  arm until you feel a stretch in your right / left shoulder, but before you have increased shoulder pain.  Try to avoid shrugging your right / left shoulder as your arm rises, by keeping your shoulder blade tucked down and toward your mid-back spine. Hold for __________ seconds.  Slowly return to the starting position. Repeat __________ times. Complete this exercise __________ times per day. ROM - Flexion, Active-Assisted  Lie on your back. You may bend your knees for  comfort.  Grasp a broomstick or cane so your hands are about shoulder width apart. Your right / left hand should grip the end of the stick, so that your hand is positioned "thumbs-up," as if you were about to shake hands.  Using your healthy arm to lead, raise your right / left arm overhead, until you feel a gentle stretch in your shoulder. Hold for __________ seconds.  Use the stick to assist in returning your right / left arm to its starting position. Repeat __________ times. Complete this exercise __________ times per day.  ROM - Internal Rotation, Supine   Lie on your back on a firm surface. Place your right / left elbow about 60 degrees away from your side. Elevate your elbow with a folded towel, so that the elbow and shoulder are the same height.  Using a broomstick or cane and your strong arm, pull your right / left hand toward your body until you feel a gentle stretch, but no increase in your shoulder pain. Keep your shoulder and elbow in place throughout the exercise.  Hold for __________ seconds. Slowly return to the starting position. Repeat __________ times. Complete this exercise __________ times per day. STRETCH - Internal Rotation  Place your right / left hand behind your back, palm up.  Throw a towel or belt over your opposite shoulder. Grasp the towel with your right / left hand.  While keeping an upright posture, gently pull up on the towel, until you feel a stretch in the front of your right / left shoulder.  Avoid shrugging your right / left shoulder as your arm rises, by keeping your shoulder blade tucked down and toward your mid-back spine.  Hold for __________ seconds. Release the stretch, by lowering your healthy hand. Repeat __________ times. Complete this exercise __________ times per day. ROM - Internal Rotation   Using an underhand grip, grasp a stick behind your back with both hands.  While standing upright with good posture, slide the stick up your back  until you feel a mild stretch in the front of your shoulder.  Hold for __________ seconds. Slowly return to your starting position. Repeat __________ times. Complete this exercise __________ times per day.  STRETCH - Posterior Shoulder Capsule   Stand or sit with good posture. Grasp your right / left elbow and draw it across your chest, keeping it at the same height as your shoulder.  Pull your elbow, so your upper arm comes in closer to your chest. Pull until you feel a gentle stretch in the back of your shoulder.  Hold for __________ seconds. Repeat __________ times. Complete this exercise __________ times per day. STRENGTHENING EXERCISES - Impingement Syndrome (Rotator Cuff Tendinitis, Bursitis) These exercises may help you when beginning to rehabilitate your injury. They may resolve your symptoms with or without further involvement from your physician, physical therapist or athletic trainer. While completing these exercises, remember:  Muscles can gain both the endurance and the strength needed for everyday activities through controlled exercises.  Complete  these exercises as instructed by your physician, physical therapist or athletic trainer. Increase the resistance and repetitions only as guided.  You may experience muscle soreness or fatigue, but the pain or discomfort you are trying to eliminate should never worsen during these exercises. If this pain does get worse, stop and make sure you are following the directions exactly. If the pain is still present after adjustments, discontinue the exercise until you can discuss the trouble with your clinician.  During your recovery, avoid activity or exercises which involve actions that place your injured hand or elbow above your head or behind your back or head. These positions stress the tissues which you are trying to heal. STRENGTH - Scapular Depression and Adduction   With good posture, sit on a firm chair. Support your arms in front of  you, with pillows, arm rests, or on a table top. Have your elbows in line with the sides of your body.  Gently draw your shoulder blades down and toward your mid-back spine. Gradually increase the tension, without tensing the muscles along the top of your shoulders and the back of your neck.  Hold for __________ seconds. Slowly release the tension and relax your muscles completely before starting the next repetition.  After you have practiced this exercise, remove the arm support and complete the exercise in standing as well as sitting position. Repeat __________ times. Complete this exercise __________ times per day.  STRENGTH - Shoulder Abductors, Isometric  With good posture, stand or sit about 4-6 inches from a wall, with your right / left side facing the wall.  Bend your right / left elbow. Gently press your right / left elbow into the wall. Increase the pressure gradually, until you are pressing as hard as you can, without shrugging your shoulder or increasing any shoulder discomfort.  Hold for __________ seconds.  Release the tension slowly. Relax your shoulder muscles completely before you begin the next repetition. Repeat __________ times. Complete this exercise __________ times per day.  STRENGTH - External Rotators, Isometric  Keep your right / left elbow at your side and bend it 90 degrees.  Step into a door frame so that the outside of your right / left wrist can press against the door frame without your upper arm leaving your side.  Gently press your right / left wrist into the door frame, as if you were trying to swing the back of your hand away from your stomach. Gradually increase the tension, until you are pressing as hard as you can, without shrugging your shoulder or increasing any shoulder discomfort.  Hold for __________ seconds.  Release the tension slowly. Relax your shoulder muscles completely before you begin the next repetition. Repeat __________ times.  Complete this exercise __________ times per day.  STRENGTH - Supraspinatus   Stand or sit with good posture. Grasp a __________ weight, or an exercise band or tubing, so that your hand is "thumbs-up," like you are shaking hands.  Slowly lift your right / left arm in a "V" away from your thigh, diagonally into the space between your side and straight ahead. Lift your hand to shoulder height or as far as you can, without increasing any shoulder pain. At first, many people do not lift their hands above shoulder height.  Avoid shrugging your right / left shoulder as your arm rises, by keeping your shoulder blade tucked down and toward your mid-back spine.  Hold for __________ seconds. Control the descent of your hand, as you slowly  return to your starting position. Repeat __________ times. Complete this exercise __________ times per day.  STRENGTH - External Rotators  Secure a rubber exercise band or tubing to a fixed object (table, pole) so that it is at the same height as your right / left elbow when you are standing or sitting on a firm surface.  Stand or sit so that the secured exercise band is at your uninjured side.  Bend your right / left elbow 90 degrees. Place a folded towel or small pillow under your right / left arm, so that your elbow is a few inches away from your side.  Keeping the tension on the exercise band, pull it away from your body, as if pivoting on your elbow. Be sure to keep your body steady, so that the movement is coming only from your rotating shoulder.  Hold for __________ seconds. Release the tension in a controlled manner, as you return to the starting position. Repeat __________ times. Complete this exercise __________ times per day.  STRENGTH - Internal Rotators   Secure a rubber exercise band or tubing to a fixed object (table, pole) so that it is at the same height as your right / left elbow when you are standing or sitting on a firm surface.  Stand or sit so  that the secured exercise band is at your right / left side.  Bend your elbow 90 degrees. Place a folded towel or small pillow under your right / left arm so that your elbow is a few inches away from your side.  Keeping the tension on the exercise band, pull it across your body, toward your stomach. Be sure to keep your body steady, so that the movement is coming only from your rotating shoulder.  Hold for __________ seconds. Release the tension in a controlled manner, as you return to the starting position. Repeat __________ times. Complete this exercise __________ times per day.  STRENGTH - Scapular Protractors, Standing   Stand arms length away from a wall. Place your hands on the wall, keeping your elbows straight.  Begin by dropping your shoulder blades down and toward your mid-back spine.  To strengthen your protractors, keep your shoulder blades down, but slide them forward on your rib cage. It will feel as if you are lifting the back of your rib cage away from the wall. This is a subtle motion and can be challenging to complete. Ask your caregiver for further instruction, if you are not sure you are doing the exercise correctly.  Hold for __________ seconds. Slowly return to the starting position, resting the muscles completely before starting the next repetition. Repeat __________ times. Complete this exercise __________ times per day. STRENGTH - Scapular Protractors, Supine  Lie on your back on a firm surface. Extend your right / left arm straight into the air while holding a __________ weight in your hand.  Keeping your head and back in place, lift your shoulder off the floor.  Hold for __________ seconds. Slowly return to the starting position, and allow your muscles to relax completely before starting the next repetition. Repeat __________ times. Complete this exercise __________ times per day. STRENGTH - Scapular Protractors, Quadruped  Get onto your hands and knees, with  your shoulders directly over your hands (or as close as you can be, comfortably).  Keeping your elbows locked, lift the back of your rib cage up into your shoulder blades, so your mid-back rounds out. Keep your neck muscles relaxed.  Hold this position  for __________ seconds. Slowly return to the starting position and allow your muscles to relax completely before starting the next repetition. Repeat __________ times. Complete this exercise __________ times per day.  STRENGTH - Scapular Retractors  Secure a rubber exercise band or tubing to a fixed object (table, pole), so that it is at the height of your shoulders when you are either standing, or sitting on a firm armless chair.  With a palm down grip, grasp an end of the band in each hand. Straighten your elbows and lift your hands straight in front of you, at shoulder height. Step back, away from the secured end of the band, until it becomes tense.  Squeezing your shoulder blades together, draw your elbows back toward your sides, as you bend them. Keep your upper arms lifted away from your body throughout the exercise.  Hold for __________ seconds. Slowly ease the tension on the band, as you reverse the directions and return to the starting position. Repeat __________ times. Complete this exercise __________ times per day. STRENGTH - Shoulder Extensors   Secure a rubber exercise band or tubing to a fixed object (table, pole) so that it is at the height of your shoulders when you are either standing, or sitting on a firm armless chair.  With a thumbs-up grip, grasp an end of the band in each hand. Straighten your elbows and lift your hands straight in front of you, at shoulder height. Step back, away from the secured end of the band, until it becomes tense.  Squeezing your shoulder blades together, pull your hands down to the sides of your thighs. Do not allow your hands to go behind you.  Hold for __________ seconds. Slowly ease the tension  on the band, as you reverse the directions and return to the starting position. Repeat __________ times. Complete this exercise __________ times per day.  STRENGTH - Scapular Retractors and External Rotators   Secure a rubber exercise band or tubing to a fixed object (table, pole) so that it is at the height as your shoulders, when you are either standing, or sitting on a firm armless chair.  With a palm down grip, grasp an end of the band in each hand. Bend your elbows 90 degrees and lift your elbows to shoulder height, at your sides. Step back, away from the secured end of the band, until it becomes tense.  Squeezing your shoulder blades together, rotate your shoulders so that your upper arms and elbows remain stationary, but your fists travel upward to head height.  Hold for __________ seconds. Slowly ease the tension on the band, as you reverse the directions and return to the starting position. Repeat __________ times. Complete this exercise __________ times per day.  STRENGTH - Scapular Retractors and External Rotators, Rowing   Secure a rubber exercise band or tubing to a fixed object (table, pole) so that it is at the height of your shoulders, when you are either standing, or sitting on a firm armless chair.  With a palm down grip, grasp an end of the band in each hand. Straighten your elbows and lift your hands straight in front of you, at shoulder height. Step back, away from the secured end of the band, until it becomes tense.  Step 1: Squeeze your shoulder blades together. Bending your elbows, draw your hands to your chest, as if you are rowing a boat. At the end of this motion, your hands and elbow should be at shoulder height and your  elbows should be out to your sides.  Step 2: Rotate your shoulders, to raise your hands above your head. Your forearms should be vertical and your upper arms should be horizontal.  Hold for __________ seconds. Slowly ease the tension on the band, as  you reverse the directions and return to the starting position. Repeat __________ times. Complete this exercise __________ times per day.  STRENGTH - Scapular Depressors  Find a sturdy chair without wheels, such as a dining room chair.  Keeping your feet on the floor, and your hands on the chair arms, lift your bottom up from the seat, and lock your elbows.  Keeping your elbows straight, allow gravity to pull your body weight down. Your shoulders will rise toward your ears.  Raise your body against gravity by drawing your shoulder blades down your back, shortening the distance between your shoulders and ears. Although your feet should always maintain contact with the floor, your feet should progressively support less body weight, as you get stronger.  Hold for __________ seconds. In a controlled and slow manner, lower your body weight to begin the next repetition. Repeat __________ times. Complete this exercise __________ times per day.  Document Released: 05/04/2005 Document Revised: 07/27/2011 Document Reviewed: 08/16/2008 [facility] Patient Information 2015 [place], [place]. This information is not intended to replace advice given to you by your health care provider. Make sure you discuss any questions you have with your health care provider.

## 2014-01-01 NOTE — Progress Notes (Signed)
Subjective:    Patient ID: Marcus Hart, male    DOB: [birth year], 48 y.o.   MRN: [id number]  HPI Marcus Hart is a 48 y.o. male  DM2 -  A1C 7.2 on 10/02/13. planned increase in exercise, should be at goal, but also discussed that A1c under 6.5 would be reasonable as long as not running too low.  No change in medication regimen at that time. On Crestor, ASA, and ACE-I (lisinopril). Weight 170 today - down from 172 last ov. No regular missed doses. No new side effects with meds - metformin 1000mg  BID and onglyza 5mg  QD. No symptomatic lows. No regular exercise - moved to new home with better area for exercise.  Lab Results  Component Value Date   MICROALBUR 0.50 02/13/2013   Other and unspecified hyperlipidemia - goal LDL under 70 with DM2.  Continued crestor at same dose with plan for exercise and diet. No new side effects. Fasting today.   Lab Results  Component Value Date   CHOL 185 07/05/2013   HDL 67 07/05/2013   LDLCALC 93 07/05/2013   TRIG 127 07/05/2013   CHOLHDL 2.8 07/05/2013    L shoulder pain - 2 months, NKI. L hand dominant. No new activities at work. Tx: otc tylenol for last few weeks - few times only. Sore to wake up with turning at sleep.   Patient Active Problem List   Diagnosis Date Noted  . DM2 (diabetes mellitus, type 2) 02/13/2013  . Other and unspecified hyperlipidemia 02/13/2013   Past Medical History  Diagnosis Date  . Diabetes mellitus   . Hyperlipidemia    No past surgical history on file. No Known Allergies Prior to Admission medications   Medication Sig Start Date End Date Taking? Authorizing Provider  aspirin 81 MG tablet Take 81 mg by mouth daily.   Yes Historical Provider, MD  lisinopril (PRINIVIL,ZESTRIL) 2.5 MG tablet TAKE 1 TABLET BY MOUTH EVERY DAY 10/02/13  Yes [name], MD  metFORMIN (GLUCOPHAGE) 1000 MG tablet Take 1 tablet (1,000 mg total) by mouth 2 (two) times daily with a meal. 10/02/13 10/02/14 Yes [name], MD    rosuvastatin (CRESTOR) 10 MG tablet Take 1 tablet (10 mg total) by mouth daily. 10/02/13  Yes [name], MD  saxagliptin HCl (ONGLYZA) 5 MG TABS tablet Take 1 tablet (5 mg total) by mouth daily. 10/02/13  Yes [name], MD   History   Social History  . Marital Status: Married    Spouse Name: N/A    Number of Children: N/A  . Years of Education: N/A   Occupational History  . Not on file.   Social History Main Topics  . Smoking status: Never Smoker   . Smokeless tobacco: Not on file  . Alcohol Use: No  . Drug Use: No  . Sexual Activity: Yes   Other Topics Concern  . Not on file   Social History Narrative  . No narrative on file       Review of Systems  Constitutional: Negative for fatigue and unexpected weight change.  Eyes: Negative for visual disturbance.  Respiratory: Negative for cough, chest tightness and shortness of breath.   Cardiovascular: Negative for chest pain, palpitations and leg swelling.  Gastrointestinal: Negative for abdominal pain and blood in stool.  Musculoskeletal: Positive for arthralgias (L shoulder. ).  Neurological: Negative for dizziness, light-headedness and headaches.       Objective:   Physical Exam  Vitals reviewed. Constitutional: He  is oriented to person, place, and time. He appears well-developed and well-nourished.  HENT:  Head: Normocephalic and atraumatic.  Eyes: EOM are normal. Pupils are equal, round, and reactive to light.  Neck: No JVD present. Carotid bruit is not present.  Cardiovascular: Normal rate, regular rhythm and normal heart sounds.   No murmur heard. Pulmonary/Chest: Effort normal and breath sounds normal. He has no rales.  Musculoskeletal: He exhibits no edema.       Left shoulder: He exhibits normal range of motion, no bony tenderness and normal strength (full rtc strength, full ROM, positive neer, negative hawkins and liftoff. negative empty can. no [place]/ac ttp. c spine nt. ).  Neurological: He is  alert and oriented to person, place, and time.  Skin: Skin is warm and dry.  Psychiatric: He has a normal mood and affect.   Filed Vitals:   01/01/14 1444  BP: 130/80  Pulse: 64  Temp: 98.4 F (36.9 C)  TempSrc: Oral  Resp: 16  Height: 5\' 11"  (1.803 m)  Weight: 170 lb (77.111 kg)  SpO2: 100%      Results for orders placed in visit on 01/01/14  GLUCOSE, POCT (MANUAL RESULT ENTRY)      Result Value Ref Range   POC Glucose 138 (*) 70 - 99 mg/dl  POCT GLYCOSYLATED HEMOGLOBIN (HGB A1C)      Result Value Ref Range   Hemoglobin A1C 6.7         Assessment & Plan:   Marcus Hart is a 48 y.o. male Type II or unspecified type diabetes mellitus without mention of complication, not stated as uncontrolled - Plan: [facility] Diabetes Foot Exam, POCT glucose (manual entry), POCT glycosylated hemoglobin (Hb A1C), COMPLETE METABOLIC PANEL WITH GFR, Lipid panel DM (diabetes mellitus), type 2, uncontrolled - Plan: lisinopril (PRINIVIL,ZESTRIL) 2.5 MG tablet  - improved control.  Congratulated on improvements, but recommended incr exercise. No med changes for now. Recheck in 3-6 months as controlled.    Left shoulder pain, Impingement syndrome, shoulder, left  - trial of HEP per handout below, otc nsaid if needed, recheck in few weeks if not improved - consider subacromial injection.   Meds ordered this encounter  Medications  . lisinopril (PRINIVIL,ZESTRIL) 2.5 MG tablet    Sig: TAKE 1 TABLET BY MOUTH EVERY DAY    Dispense:  90 tablet    Refill:  0   Patient Instructions  See handout on shoulder pain. If not improved in 2 - 3 weeks, or worse sooner - return to check further or possible injection. Ok to try tylenol and only occasional advil or alleve if needed.   You should receive a call or letter about your lab results within the next week to 10 days.   plan on recheck in next 3-6 months for diabetes.  Exercise 13mins/week as discussed.   Impingement Syndrome, Rotator Cuff, Bursitis with  Rehab Impingement syndrome is a condition that involves inflammation of the tendons of the rotator cuff and the subacromial bursa, that causes pain in the shoulder. The rotator cuff consists of four tendons and muscles that control much of the shoulder and upper arm function. The subacromial bursa is a fluid filled sac that helps reduce friction between the rotator cuff and one of the bones of the shoulder (acromion). Impingement syndrome is usually an overuse injury that causes swelling of the bursa (bursitis), swelling of the tendon (tendonitis), and/or a tear of the tendon (strain). Strains are classified into three categories. Grade 1 strains cause  pain, but the tendon is not lengthened. Grade 2 strains include a lengthened ligament, due to the ligament being stretched or partially ruptured. With grade 2 strains there is still function, although the function may be decreased. Grade 3 strains include a complete tear of the tendon or muscle, and function is usually impaired. SYMPTOMS   Pain around the shoulder, often at the outer portion of the upper arm.  Pain that gets worse with shoulder function, especially when reaching overhead or lifting.  Sometimes, aching when not using the arm.  Pain that wakes you up at night.  Sometimes, tenderness, swelling, warmth, or redness over the affected area.  Loss of strength.  Limited motion of the shoulder, especially reaching behind the back (to the back pocket or to unhook bra) or across your body.  Crackling sound (crepitation) when moving the arm.  Biceps tendon pain and inflammation (in the front of the shoulder). Worse when bending the elbow or lifting. CAUSES  Impingement syndrome is often an overuse injury, in which chronic (repetitive) motions cause the tendons or bursa to become inflamed. A strain occurs when a force is paced on the tendon or muscle that is greater than it can withstand. Common mechanisms of injury include: Stress from  sudden increase in duration, frequency, or intensity of training.  Direct hit (trauma) to the shoulder.  Aging, erosion of the tendon with normal use.  Bony bump on shoulder (acromial spur). RISK INCREASES WITH:  Contact sports (football, wrestling, boxing).  Throwing sports (baseball, tennis, volleyball).  Weightlifting and bodybuilding.  Heavy labor.  Previous injury to the rotator cuff, including impingement.  Poor shoulder strength and flexibility.  Failure to warm up properly before activity.  Inadequate protective equipment.  Old age.  Bony bump on shoulder (acromial spur). PREVENTION   Warm up and stretch properly before activity.  Allow for adequate recovery between workouts.  Maintain physical fitness:  Strength, flexibility, and endurance.  Cardiovascular fitness.  Learn and use proper exercise technique. PROGNOSIS  If treated properly, impingement syndrome usually goes away within 6 weeks. Sometimes surgery is required.  RELATED COMPLICATIONS   Longer healing time if not properly treated, or if not given enough time to heal.  Recurring symptoms, that result in a chronic condition.  Shoulder stiffness, frozen shoulder, or loss of motion.  Rotator cuff tendon tear.  Recurring symptoms, especially if activity is resumed too soon, with overuse, with a direct blow, or when using poor technique. TREATMENT  Treatment first involves the use of ice and medicine, to reduce pain and inflammation. The use of strengthening and stretching exercises may help reduce pain with activity. These exercises may be performed at home or with a therapist. If non-surgical treatment is unsuccessful after more than 6 months, surgery may be advised. After surgery and rehabilitation, activity is usually possible in 3 months.  MEDICATION  If pain medicine is needed, nonsteroidal anti-inflammatory medicines (aspirin and ibuprofen), or other minor pain relievers (acetaminophen),  are often advised.  Do not take pain medicine for 7 days before surgery.  Prescription pain relievers may be given, if your caregiver thinks they are needed. Use only as directed and only as much as you need.  Corticosteroid injections may be given by your caregiver. These injections should be reserved for the most serious cases, because they may only be given a certain number of times. HEAT AND COLD  Cold treatment (icing) should be applied for 10 to 15 minutes every 2 to 3 hours for inflammation  and pain, and immediately after activity that aggravates your symptoms. Use ice packs or an ice massage.  Heat treatment may be used before performing stretching and strengthening activities prescribed by your caregiver, physical therapist, or athletic trainer. Use a heat pack or a warm water soak. SEEK MEDICAL CARE IF:   Symptoms get worse or do not improve in 4 to 6 weeks, despite treatment.  New, unexplained symptoms develop. (Drugs used in treatment may produce side effects.) EXERCISES  RANGE OF MOTION (ROM) AND STRETCHING EXERCISES - Impingement Syndrome (Rotator Cuff  Tendinitis, Bursitis) These exercises may help you when beginning to rehabilitate your injury. Your symptoms may go away with or without further involvement from your physician, physical therapist or athletic trainer. While completing these exercises, remember:   Restoring tissue flexibility helps normal motion to return to the joints. This allows healthier, less painful movement and activity.  An effective stretch should be held for at least 30 seconds.  A stretch should never be painful. You should only feel a gentle lengthening or release in the stretched tissue. STRETCH - Flexion, Standing  Stand with good posture. With an underhand grip on your right / left hand, and an overhand grip on the opposite hand, grasp a broomstick or cane so that your hands are a little more than shoulder width apart.  Keeping your right /  left elbow straight and shoulder muscles relaxed, push the stick with your opposite hand, to raise your right / left arm in front of your body and then overhead. Raise your arm until you feel a stretch in your right / left shoulder, but before you have increased shoulder pain.  Try to avoid shrugging your right / left shoulder as your arm rises, by keeping your shoulder blade tucked down and toward your mid-back spine. Hold for __________ seconds.  Slowly return to the starting position. Repeat __________ times. Complete this exercise __________ times per day. STRETCH - Abduction, Supine  Lie on your back. With an underhand grip on your right / left hand and an overhand grip on the opposite hand, grasp a broomstick or cane so that your hands are a little more than shoulder width apart.  Keeping your right / left elbow straight and your shoulder muscles relaxed, push the stick with your opposite hand, to raise your right / left arm out to the side of your body and then overhead. Raise your arm until you feel a stretch in your right / left shoulder, but before you have increased shoulder pain.  Try to avoid shrugging your right / left shoulder as your arm rises, by keeping your shoulder blade tucked down and toward your mid-back spine. Hold for __________ seconds.  Slowly return to the starting position. Repeat __________ times. Complete this exercise __________ times per day. ROM - Flexion, Active-Assisted  Lie on your back. You may bend your knees for comfort.  Grasp a broomstick or cane so your hands are about shoulder width apart. Your right / left hand should grip the end of the stick, so that your hand is positioned "thumbs-up," as if you were about to shake hands.  Using your healthy arm to lead, raise your right / left arm overhead, until you feel a gentle stretch in your shoulder. Hold for __________ seconds.  Use the stick to assist in returning your right / left arm to its starting  position. Repeat __________ times. Complete this exercise __________ times per day.  ROM - Internal Rotation, Supine  Lie on your back on a firm surface. Place your right / left elbow about 60 degrees away from your side. Elevate your elbow with a folded towel, so that the elbow and shoulder are the same height.  Using a broomstick or cane and your strong arm, pull your right / left hand toward your body until you feel a gentle stretch, but no increase in your shoulder pain. Keep your shoulder and elbow in place throughout the exercise.  Hold for __________ seconds. Slowly return to the starting position. Repeat __________ times. Complete this exercise __________ times per day. STRETCH - Internal Rotation  Place your right / left hand behind your back, palm up.  Throw a towel or belt over your opposite shoulder. Grasp the towel with your right / left hand.  While keeping an upright posture, gently pull up on the towel, until you feel a stretch in the front of your right / left shoulder.  Avoid shrugging your right / left shoulder as your arm rises, by keeping your shoulder blade tucked down and toward your mid-back spine.  Hold for __________ seconds. Release the stretch, by lowering your healthy hand. Repeat __________ times. Complete this exercise __________ times per day. ROM - Internal Rotation   Using an underhand grip, grasp a stick behind your back with both hands.  While standing upright with good posture, slide the stick up your back until you feel a mild stretch in the front of your shoulder.  Hold for __________ seconds. Slowly return to your starting position. Repeat __________ times. Complete this exercise __________ times per day.  STRETCH - Posterior Shoulder Capsule   Stand or sit with good posture. Grasp your right / left elbow and draw it across your chest, keeping it at the same height as your shoulder.  Pull your elbow, so your upper arm comes in closer to your  chest. Pull until you feel a gentle stretch in the back of your shoulder.  Hold for __________ seconds. Repeat __________ times. Complete this exercise __________ times per day. STRENGTHENING EXERCISES - Impingement Syndrome (Rotator Cuff Tendinitis, Bursitis) These exercises may help you when beginning to rehabilitate your injury. They may resolve your symptoms with or without further involvement from your physician, physical therapist or athletic trainer. While completing these exercises, remember:  Muscles can gain both the endurance and the strength needed for everyday activities through controlled exercises.  Complete these exercises as instructed by your physician, physical therapist or athletic trainer. Increase the resistance and repetitions only as guided.  You may experience muscle soreness or fatigue, but the pain or discomfort you are trying to eliminate should never worsen during these exercises. If this pain does get worse, stop and make sure you are following the directions exactly. If the pain is still present after adjustments, discontinue the exercise until you can discuss the trouble with your clinician.  During your recovery, avoid activity or exercises which involve actions that place your injured hand or elbow above your head or behind your back or head. These positions stress the tissues which you are trying to heal. STRENGTH - Scapular Depression and Adduction   With good posture, sit on a firm chair. Support your arms in front of you, with pillows, arm rests, or on a table top. Have your elbows in line with the sides of your body.  Gently draw your shoulder blades down and toward your mid-back spine. Gradually increase the tension, without tensing the muscles along the top of your  shoulders and the back of your neck.  Hold for __________ seconds. Slowly release the tension and relax your muscles completely before starting the next repetition.  After you have practiced  this exercise, remove the arm support and complete the exercise in standing as well as sitting position. Repeat __________ times. Complete this exercise __________ times per day.  STRENGTH - Shoulder Abductors, Isometric  With good posture, stand or sit about 4-6 inches from a wall, with your right / left side facing the wall.  Bend your right / left elbow. Gently press your right / left elbow into the wall. Increase the pressure gradually, until you are pressing as hard as you can, without shrugging your shoulder or increasing any shoulder discomfort.  Hold for __________ seconds.  Release the tension slowly. Relax your shoulder muscles completely before you begin the next repetition. Repeat __________ times. Complete this exercise __________ times per day.  STRENGTH - External Rotators, Isometric  Keep your right / left elbow at your side and bend it 90 degrees.  Step into a door frame so that the outside of your right / left wrist can press against the door frame without your upper arm leaving your side.  Gently press your right / left wrist into the door frame, as if you were trying to swing the back of your hand away from your stomach. Gradually increase the tension, until you are pressing as hard as you can, without shrugging your shoulder or increasing any shoulder discomfort.  Hold for __________ seconds.  Release the tension slowly. Relax your shoulder muscles completely before you begin the next repetition. Repeat __________ times. Complete this exercise __________ times per day.  STRENGTH - Supraspinatus   Stand or sit with good posture. Grasp a __________ weight, or an exercise band or tubing, so that your hand is "thumbs-up," like you are shaking hands.  Slowly lift your right / left arm in a "V" away from your thigh, diagonally into the space between your side and straight ahead. Lift your hand to shoulder height or as far as you can, without increasing any shoulder pain. At  first, many people do not lift their hands above shoulder height.  Avoid shrugging your right / left shoulder as your arm rises, by keeping your shoulder blade tucked down and toward your mid-back spine.  Hold for __________ seconds. Control the descent of your hand, as you slowly return to your starting position. Repeat __________ times. Complete this exercise __________ times per day.  STRENGTH - External Rotators  Secure a rubber exercise band or tubing to a fixed object (table, pole) so that it is at the same height as your right / left elbow when you are standing or sitting on a firm surface.  Stand or sit so that the secured exercise band is at your uninjured side.  Bend your right / left elbow 90 degrees. Place a folded towel or small pillow under your right / left arm, so that your elbow is a few inches away from your side.  Keeping the tension on the exercise band, pull it away from your body, as if pivoting on your elbow. Be sure to keep your body steady, so that the movement is coming only from your rotating shoulder.  Hold for __________ seconds. Release the tension in a controlled manner, as you return to the starting position. Repeat __________ times. Complete this exercise __________ times per day.  STRENGTH - Internal Rotators   Secure a rubber exercise band  or tubing to a fixed object (table, pole) so that it is at the same height as your right / left elbow when you are standing or sitting on a firm surface.  Stand or sit so that the secured exercise band is at your right / left side.  Bend your elbow 90 degrees. Place a folded towel or small pillow under your right / left arm so that your elbow is a few inches away from your side.  Keeping the tension on the exercise band, pull it across your body, toward your stomach. Be sure to keep your body steady, so that the movement is coming only from your rotating shoulder.  Hold for __________ seconds. Release the tension in a  controlled manner, as you return to the starting position. Repeat __________ times. Complete this exercise __________ times per day.  STRENGTH - Scapular Protractors, Standing   Stand arms length away from a wall. Place your hands on the wall, keeping your elbows straight.  Begin by dropping your shoulder blades down and toward your mid-back spine.  To strengthen your protractors, keep your shoulder blades down, but slide them forward on your rib cage. It will feel as if you are lifting the back of your rib cage away from the wall. This is a subtle motion and can be challenging to complete. Ask your caregiver for further instruction, if you are not sure you are doing the exercise correctly.  Hold for __________ seconds. Slowly return to the starting position, resting the muscles completely before starting the next repetition. Repeat __________ times. Complete this exercise __________ times per day. STRENGTH - Scapular Protractors, Supine  Lie on your back on a firm surface. Extend your right / left arm straight into the air while holding a __________ weight in your hand.  Keeping your head and back in place, lift your shoulder off the floor.  Hold for __________ seconds. Slowly return to the starting position, and allow your muscles to relax completely before starting the next repetition. Repeat __________ times. Complete this exercise __________ times per day. STRENGTH - Scapular Protractors, Quadruped  Get onto your hands and knees, with your shoulders directly over your hands (or as close as you can be, comfortably).  Keeping your elbows locked, lift the back of your rib cage up into your shoulder blades, so your mid-back rounds out. Keep your neck muscles relaxed.  Hold this position for __________ seconds. Slowly return to the starting position and allow your muscles to relax completely before starting the next repetition. Repeat __________ times. Complete this exercise __________  times per day.  STRENGTH - Scapular Retractors  Secure a rubber exercise band or tubing to a fixed object (table, pole), so that it is at the height of your shoulders when you are either standing, or sitting on a firm armless chair.  With a palm down grip, grasp an end of the band in each hand. Straighten your elbows and lift your hands straight in front of you, at shoulder height. Step back, away from the secured end of the band, until it becomes tense.  Squeezing your shoulder blades together, draw your elbows back toward your sides, as you bend them. Keep your upper arms lifted away from your body throughout the exercise.  Hold for __________ seconds. Slowly ease the tension on the band, as you reverse the directions and return to the starting position. Repeat __________ times. Complete this exercise __________ times per day. STRENGTH - Shoulder Extensors   Secure a  rubber exercise band or tubing to a fixed object (table, pole) so that it is at the height of your shoulders when you are either standing, or sitting on a firm armless chair.  With a thumbs-up grip, grasp an end of the band in each hand. Straighten your elbows and lift your hands straight in front of you, at shoulder height. Step back, away from the secured end of the band, until it becomes tense.  Squeezing your shoulder blades together, pull your hands down to the sides of your thighs. Do not allow your hands to go behind you.  Hold for __________ seconds. Slowly ease the tension on the band, as you reverse the directions and return to the starting position. Repeat __________ times. Complete this exercise __________ times per day.  STRENGTH - Scapular Retractors and External Rotators   Secure a rubber exercise band or tubing to a fixed object (table, pole) so that it is at the height as your shoulders, when you are either standing, or sitting on a firm armless chair.  With a palm down grip, grasp an end of the band in each  hand. Bend your elbows 90 degrees and lift your elbows to shoulder height, at your sides. Step back, away from the secured end of the band, until it becomes tense.  Squeezing your shoulder blades together, rotate your shoulders so that your upper arms and elbows remain stationary, but your fists travel upward to head height.  Hold for __________ seconds. Slowly ease the tension on the band, as you reverse the directions and return to the starting position. Repeat __________ times. Complete this exercise __________ times per day.  STRENGTH - Scapular Retractors and External Rotators, Rowing   Secure a rubber exercise band or tubing to a fixed object (table, pole) so that it is at the height of your shoulders, when you are either standing, or sitting on a firm armless chair.  With a palm down grip, grasp an end of the band in each hand. Straighten your elbows and lift your hands straight in front of you, at shoulder height. Step back, away from the secured end of the band, until it becomes tense.  Step 1: Squeeze your shoulder blades together. Bending your elbows, draw your hands to your chest, as if you are rowing a boat. At the end of this motion, your hands and elbow should be at shoulder height and your elbows should be out to your sides.  Step 2: Rotate your shoulders, to raise your hands above your head. Your forearms should be vertical and your upper arms should be horizontal.  Hold for __________ seconds. Slowly ease the tension on the band, as you reverse the directions and return to the starting position. Repeat __________ times. Complete this exercise __________ times per day.  STRENGTH - Scapular Depressors  Find a sturdy chair without wheels, such as a dining room chair.  Keeping your feet on the floor, and your hands on the chair arms, lift your bottom up from the seat, and lock your elbows.  Keeping your elbows straight, allow gravity to pull your body weight down. Your shoulders  will rise toward your ears.  Raise your body against gravity by drawing your shoulder blades down your back, shortening the distance between your shoulders and ears. Although your feet should always maintain contact with the floor, your feet should progressively support less body weight, as you get stronger.  Hold for __________ seconds. In a controlled and slow manner, lower your  body weight to begin the next repetition. Repeat __________ times. Complete this exercise __________ times per day.  Document Released: 05/04/2005 Document Revised: 07/27/2011 Document Reviewed: 08/16/2008 [facility] Patient Information 2015 [place], [place]. This information is not intended to replace advice given to you by your health care provider. Make sure you discuss any questions you have with your health care provider.

## 2014-01-02 LAB — LIPID PANEL
CHOL/HDL RATIO: 2.3 ratio
CHOLESTEROL: 146 mg/dL (ref 0–200)
HDL: 63 mg/dL (ref >39)
LDL Cholesterol: 65 mg/dL (ref 0–99)
Triglycerides: 88 mg/dL (ref <150)
VLDL: 18 mg/dL (ref 0–40)

## 2014-01-02 LAB — COMPLETE METABOLIC PANEL WITH GFR
ALBUMIN: 4.6 g/dL (ref 3.5–5.2)
ALK PHOS: 38 U/L — AB (ref 39–117)
ALT: 19 U/L (ref 0–53)
AST: 19 U/L (ref 0–37)
BILIRUBIN TOTAL: 0.6 mg/dL (ref 0.2–1.2)
BUN: 11 mg/dL (ref 6–23)
CO2: 25 mEq/L (ref 19–32)
Calcium: 9.5 mg/dL (ref 8.4–10.5)
Chloride: 103 mEq/L (ref 96–112)
Creat: 0.61 mg/dL (ref 0.50–1.35)
GFR, Est African American: >89 mL/min
GLUCOSE: 141 mg/dL — AB (ref 70–99)
POTASSIUM: 4.3 meq/L (ref 3.5–5.3)
SODIUM: 138 meq/L (ref 135–145)
TOTAL PROTEIN: 6.8 g/dL (ref 6.0–8.3)

## 2014-01-16 ENCOUNTER — Other Ambulatory Visit: Payer: Self-pay | Admitting: Family Medicine

## 2014-02-09 ENCOUNTER — Telehealth: Payer: Self-pay

## 2014-02-09 NOTE — Telephone Encounter (Signed)
PA was needed for Crestor back in April and form was completed on covermymeds. Pt had tried/failed pravastatin that he was on from 2009-2011, which was not controlling his chol. Pt is uncontrolled diabetic and the crestor 10 has been controlling his chol since 2011. I never received decision from ins. Checked w/pharm who stated ins has been covering Rx since I completed PA, so it was approved.

## 2014-05-07 ENCOUNTER — Ambulatory Visit (INDEPENDENT_AMBULATORY_CARE_PROVIDER_SITE_OTHER): Payer: 59

## 2014-05-07 ENCOUNTER — Ambulatory Visit (INDEPENDENT_AMBULATORY_CARE_PROVIDER_SITE_OTHER): Payer: 59 | Admitting: Family Medicine

## 2014-05-07 ENCOUNTER — Other Ambulatory Visit: Payer: Self-pay | Admitting: Family Medicine

## 2014-05-07 VITALS — BP 126/86 | HR 61 | Temp 98.0°F | Resp 16 | Ht 71.0 in | Wt 178.0 lb

## 2014-05-07 DIAGNOSIS — E785 Hyperlipidemia, unspecified: Secondary | ICD-10-CM

## 2014-05-07 DIAGNOSIS — IMO0002 Reserved for concepts with insufficient information to code with codable children: Secondary | ICD-10-CM

## 2014-05-07 DIAGNOSIS — E119 Type 2 diabetes mellitus without complications: Secondary | ICD-10-CM

## 2014-05-07 DIAGNOSIS — E1165 Type 2 diabetes mellitus with hyperglycemia: Secondary | ICD-10-CM

## 2014-05-07 DIAGNOSIS — M25512 Pain in left shoulder: Secondary | ICD-10-CM

## 2014-05-07 DIAGNOSIS — Z23 Encounter for immunization: Secondary | ICD-10-CM

## 2014-05-07 LAB — COMPLETE METABOLIC PANEL WITH GFR
ALT: 18 U/L (ref 0–53)
AST: 17 U/L (ref 0–37)
Albumin: 4.6 g/dL (ref 3.5–5.2)
Calcium: 10 mg/dL (ref 8.4–10.5)
Chloride: 102 mEq/L (ref 96–112)
Creat: 0.74 mg/dL (ref 0.50–1.35)
GFR, Est African American: >89 mL/min
GFR, Est Non African American: >89 mL/min
Glucose, Bld: 121 mg/dL — ABNORMAL HIGH (ref 70–99)
Potassium: 4.1 mEq/L (ref 3.5–5.3)
Sodium: 139 mEq/L (ref 135–145)
Total Bilirubin: 0.4 mg/dL (ref 0.2–1.2)

## 2014-05-07 LAB — COMPLETE METABOLIC PANEL WITHOUT GFR
Alkaline Phosphatase: 51 U/L (ref 39–117)
BUN: 12 mg/dL (ref 6–23)
CO2: 27 meq/L (ref 19–32)
Total Protein: 7.2 g/dL (ref 6.0–8.3)

## 2014-05-07 LAB — POCT GLYCOSYLATED HEMOGLOBIN (HGB A1C): Hemoglobin A1C: 7.4

## 2014-05-07 MED ORDER — MELOXICAM 7.5 MG PO TABS: 7.5000 mg | ORAL_TABLET | Freq: Two times a day (BID) | ORAL | Status: DC | PRN

## 2014-05-07 MED ORDER — ROSUVASTATIN CALCIUM 10 MG PO TABS: 10.0000 mg | ORAL_TABLET | Freq: Every day | ORAL | Status: DC

## 2014-05-07 MED ORDER — METFORMIN HCL 1000 MG PO TABS: 1000.0000 mg | ORAL_TABLET | Freq: Two times a day (BID) | ORAL | Status: DC

## 2014-05-07 MED ORDER — LISINOPRIL 2.5 MG PO TABS: ORAL_TABLET | ORAL | Status: DC

## 2014-05-07 MED ORDER — CYCLOBENZAPRINE HCL 5 MG PO TABS: 5.0000 mg | ORAL_TABLET | Freq: Every evening | ORAL | Status: DC | PRN

## 2014-05-07 MED ORDER — SAXAGLIPTIN HCL 5 MG PO TABS: 5.0000 mg | ORAL_TABLET | Freq: Every day | ORAL | Status: DC

## 2014-05-07 NOTE — Patient Instructions (Signed)
Diabetes and Standards of Medical Care Diabetes is complicated. You may find that your diabetes team includes a dietitian, nurse, diabetes educator, eye doctor, and more. To help everyone know what is going on and to help you get the care you deserve, the following schedule of care was developed to help keep you on track. Below are the tests, exams, vaccines, medicines, education, and plans you will need. HbA1c test This test shows how well you have controlled your glucose over the past 2-3 months. It is used to see if your diabetes management plan needs to be adjusted.   It is performed at least 2 times a year if you are meeting treatment goals.  It is performed 4 times a year if therapy has changed or if you are not meeting treatment goals. Blood pressure test  This test is performed at every routine medical visit. The goal is less than 140/90 mm Hg for most people, but 130/80 mm Hg in some cases. Ask your health care provider about your goal. Dental exam  Follow up with the dentist regularly. Eye exam  If you are diagnosed with type 1 diabetes as a child, get an exam upon reaching the age of 37 years or older and have had diabetes for 3-5 years. Yearly eye exams are recommended after that initial eye exam.  If you are diagnosed with type 1 diabetes as an adult, get an exam within 5 years of diagnosis and then yearly.  If you are diagnosed with type 2 diabetes, get an exam as soon as possible after the diagnosis and then yearly. Foot care exam  Visual foot exams are performed at every routine medical visit. The exams check for cuts, injuries, or other problems with the feet.  A comprehensive foot exam should be done yearly. This includes visual inspection as well as assessing foot pulses and testing for loss of sensation.  Check your feet nightly for cuts, injuries, or other problems with your feet. Tell your health care provider if anything is not healing. Kidney function test (urine  microalbumin)  This test is performed once a year.  Type 1 diabetes: The first test is performed 5 years after diagnosis.  Type 2 diabetes: The first test is performed at the time of diagnosis.  A serum creatinine and estimated glomerular filtration rate (eGFR) test is done once a year to assess the level of chronic kidney disease (CKD), if present. Lipid profile (cholesterol, HDL, LDL, triglycerides)  Performed every 5 years for most people.  The goal for LDL is less than 100 mg/dL. If you are at high risk, the goal is less than 70 mg/dL.  The goal for HDL is 40 mg/dL-50 mg/dL for men and 50 mg/dL-60 mg/dL for women. An HDL cholesterol of 60 mg/dL or higher gives some protection against heart disease.  The goal for triglycerides is less than 150 mg/dL. Influenza vaccine, pneumococcal vaccine, and hepatitis B vaccine  The influenza vaccine is recommended yearly.  It is recommended that people with diabetes who are over 24 years old get the pneumonia vaccine. In some cases, two separate shots may be given. Ask your health care provider if your pneumonia vaccination is up to date.  The hepatitis B vaccine is also recommended for adults with diabetes. Diabetes self-management education  Education is recommended at diagnosis and ongoing as needed. Treatment plan  Your treatment plan is reviewed at every medical visit. Document Released: 03/01/2009 Document Revised: 09/18/2013 Document Reviewed: 10/04/2012 [facility] Patient Information 2015 [place],  [employer]. This information is not intended to replace advice given to you by your health care provider. Make sure you discuss any questions you have with your health care provider.  

## 2014-05-07 NOTE — Progress Notes (Signed)
Chief Complaint:  Chief Complaint  Patient presents with  . Follow-up    DIABETES, and LEFT ARM pain x 3-4 months    HPI: Marcus Hart is a 48 y.o. male who is here for  Here for DM adn hyperlipidemia recheck His sugars have been 114 or so He has not been watching his weight He ahs left shoudler pain x 6 months, aches at night does not natter if he is sleeping on it or not.  He has not taken anytuogn for it Deneis n/w/t. 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  Past Medical History  Diagnosis Date  . Diabetes mellitus   . Hyperlipidemia    No past surgical history on file. History   Social History  . Marital Status: Married    Spouse Name: N/A    Number of Children: N/A  . Years of Education: N/A   Social History Main Topics  . Smoking status: Never Smoker   . Smokeless tobacco: None  . Alcohol Use: No  . Drug Use: No  . Sexual Activity: Yes   Other Topics Concern  . None   Social History Narrative   Family History  Problem Relation Age of Onset  . Diabetes Mother   . Cancer Mother     bladder  . Ulcers Father   . Benign prostatic hyperplasia Father    No Known Allergies Prior to Admission medications   Medication Sig Start Date End Date Taking? Authorizing Provider  aspirin 81 MG tablet Take 81 mg by mouth daily.   Yes Historical Provider, MD  lisinopril (PRINIVIL,ZESTRIL) 2.5 MG tablet TAKE 1 TABLET BY MOUTH EVERY DAY 01/01/14  Yes [name], MD  metFORMIN (GLUCOPHAGE) 1000 MG tablet Take 1 tablet (1,000 mg total) by mouth 2 (two) times daily with a meal. 10/02/13 10/02/14 Yes [name], MD  ONGLYZA 5 MG TABS tablet TAKE 1 TABLET BY MOUTH DAILY 01/17/14  Yes [name], MD  rosuvastatin (CRESTOR) 10 MG tablet Take 1 tablet (10 mg total) by mouth daily. 10/02/13  Yes [name], MD      ROS: The patient denies fevers, chills, night sweats, unintentional weight loss, chest pain, palpitations, wheezing, dyspnea on exertion, nausea, vomiting, abdominal pain, dysuria, hematuria, melena, numbness, weakness, or tingling.   All other systems have been reviewed and were otherwise negative with the exception of those mentioned in the HPI and as above.    PHYSICAL EXAM: Filed Vitals:   05/07/14 1513  BP: 126/86  Pulse: 61  Temp: 98 F (36.7 C)  Resp: 16   Filed Vitals:   05/07/14 1513  Height: 5\' 11"  (1.803 m)  Weight: 178 lb (80.74 kg)   Body mass index is 24.84 kg/(m^2).  General: Alert, no acute distress HEENT:  Normocephalic, atraumatic, oropharynx patent. EOMI, PERRLA, fundo exam nl Cardiovascular:  Regular rate and rhythm, no rubs murmurs or gallops.  No Carotid bruits, radial pulse intact. No pedal edema.  Respiratory: Clear to auscultation bilaterally.  No wheezes, rales, or rhonchi.  No cyanosis, no use of accessory musculature GI: No organomegaly, abdomen is soft and non-tender, positive bowel sounds.  No masses. Skin: No rashes. Neurologic: Facial musculature symmetric. Mircofilament exam nl Psychiatric: Patient is appropriate throughout our interaction. Lymphatic: No cervical lymphadenopathy Musculoskeletal: Gait intact. Neck-normal  LEft Shoulder-Full PROM, + pain with ER, IR 5/5 strength Neg empty can, lift off    LABS: Results for orders placed or performed in visit on 05/07/14  COMPLETE METABOLIC PANEL WITH GFR  Result Value Ref Range   Sodium 139 135 - 145 mEq/L   Potassium 4.1 3.5 - 5.3 mEq/L   Chloride 102 96 - 112 mEq/L   CO2 27 19 - 32 mEq/L   Glucose, Bld 121 (H) 70 - 99 mg/dL   BUN 12 6 - 23 mg/dL   Creat 0.74 0.50 - 1.35 mg/dL   Total Bilirubin 0.4 0.2 - 1.2 mg/dL   Alkaline Phosphatase 51 39 - 117 U/L   AST 17 0 - 37 U/L   ALT 18 0 - 53 U/L   Total Protein 7.2 6.0 - 8.3 g/dL   Albumin 4.6 3.5 - 5.2 g/dL   Calcium 10.0 8.4 -  10.5 mg/dL   GFR, Est African American >89 mL/min   GFR, Est Non African American >89 mL/min  Microalbumin, urine  Result Value Ref Range   Microalb, Ur <0.2 <2.0 mg/dL  POCT glycosylated hemoglobin (Hb A1C)  Result Value Ref Range   Hemoglobin A1C 7.4    Lab Results  Component Value Date   HGBA1C 7.4 05/07/2014   HGBA1C 6.7 01/01/2014   HGBA1C 7.2 10/02/2013   Lab Results  Component Value Date   MICROALBUR <0.2 05/07/2014   LDLCALC 65 01/01/2014   CREATININE 0.74 05/07/2014     EKG/XRAY:   Primary read interpreted by Dr. Marin Comment at [facility]. Neg for fx or dislocation   ASSESSMENT/PLAN: Encounter Diagnoses  Name Primary?  . Diabetes mellitus without complication Yes  . Left shoulder pain   . Hyperlipidemia   . DM (diabetes mellitus), type 2, uncontrolled   . 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  He will cont with meds I have given him 1 year rx refill in the event he loses his insurance. F/u in 4 months Rx flexeril and mobic   For rotator cuff tenodnitis  Gross sideeffects, risk and benefits, and alternatives of medications d/w patient. Patient is aware that all medications have potential sideeffects and we are unable to predict every sideeffect or drug-drug interaction that may occur.  Kaiven Vester, [place], DO 05/12/2014 11:15 AM

## 2014-05-08 LAB — MICROALBUMIN, URINE: Microalb, Ur: <0.2 mg/dL (ref <2.0)

## 2014-06-18 ENCOUNTER — Encounter: Payer: 59 | Admitting: Family Medicine

## 2014-06-18 NOTE — Progress Notes (Signed)
This encounter was created in error - please disregard.

## 2014-07-07 ENCOUNTER — Ambulatory Visit (INDEPENDENT_AMBULATORY_CARE_PROVIDER_SITE_OTHER): Payer: 59 | Admitting: Family Medicine

## 2014-07-07 VITALS — BP 110/85 | HR 71 | Temp 98.3°F | Resp 18 | Wt 176.0 lb

## 2014-07-07 DIAGNOSIS — M25512 Pain in left shoulder: Secondary | ICD-10-CM

## 2014-07-07 MED ORDER — TRIAMCINOLONE ACETONIDE 40 MG/ML IJ SUSP
40.0000 mg | Freq: Once | INTRAMUSCULAR | Status: AC
  Administered 2014-07-07: 40 mg via INTRAMUSCULAR

## 2014-07-07 NOTE — Patient Instructions (Addendum)
Please rest the shoulder over the next week.  Then proceed to do the exercises that were given to you.  I have added them again below.  Choose three exercises per day and do the movement 8 times.   Joint Injection Care After Refer to this sheet in the next few days. These instructions provide you with information on caring for yourself after you have had a joint injection. Your caregiver also may give you more specific instructions. Your treatment has been planned according to current medical practices, but problems sometimes occur. Call your caregiver if you have any problems or questions after your procedure. After any type of joint injection, it is not uncommon to experience:  Soreness, swelling, or bruising around the injection site.  Mild numbness, tingling, or weakness around the injection site caused by the numbing medicine used before or with the injection. It also is possible to experience the following effects associated with the specific agent after injection:  Iodine-based contrast agents:  Allergic reaction (itching, hives, widespread redness, and swelling beyond the injection site).  Corticosteroids (These effects are rare.):  Allergic reaction.  Increased blood sugar levels (If you have diabetes and you notice that your blood sugar levels have increased, notify your caregiver).  Increased blood pressure levels.  Mood swings.  Hyaluronic acid in the use of viscosupplementation.  Temporary heat or redness.  Temporary rash and itching.  Increased fluid accumulation in the injected joint. These effects all should resolve within a day after your procedure.  HOME CARE INSTRUCTIONS  Limit yourself to light activity the day of your procedure. Avoid lifting heavy objects, bending, stooping, or twisting.  Take prescription or over-the-counter pain medication as directed by your caregiver.  You may apply ice to your injection site to reduce pain and swelling the day of  your procedure. Ice may be applied 03-04 times:  Put ice in a plastic bag.  Place a towel between your skin and the bag.  Leave the ice on for no longer than 15-20 minutes each time. SEEK IMMEDIATE MEDICAL CARE IF:   Pain and swelling get worse rather than better or extend beyond the injection site.  Numbness does not go away.  Blood or fluid continues to leak from the injection site.  You have chest pain.  You have swelling of your face or tongue.  You have trouble breathing or you become dizzy.  You develop a fever, chills, or severe tenderness at the injection site that last longer than 1 day. MAKE SURE YOU:  Understand these instructions.  Watch your condition.  Get help right away if you are not doing well or if you get worse. Document Released: 01/15/2011 Document Revised: 07/27/2011 Document Reviewed: 01/15/2011 [facility] Patient Information 2015 [place], [place]. This information is not intended to replace advice given to you by your health care provider. Make sure you discuss any questions you have with your health care provider.  Shoulder Exercises EXERCISES  RANGE OF MOTION (ROM) AND STRETCHING EXERCISES These exercises may help you when beginning to rehabilitate your injury. Your symptoms may resolve with or without further involvement from your physician, physical therapist or athletic trainer. While completing these exercises, remember:   Restoring tissue flexibility helps normal motion to return to the joints. This allows healthier, less painful movement and activity.  An effective stretch should be held for at least 30 seconds.  A stretch should never be painful. You should only feel a gentle lengthening or release in the stretched tissue. ROM -  Pendulum  Bend at the waist so that your right / left arm falls away from your body. Support yourself with your opposite hand on a solid surface, such as a table or a countertop.  Your right / left arm should be  perpendicular to the ground. If it is not perpendicular, you need to lean over farther. Relax the muscles in your right / left arm and shoulder as much as possible.  Gently sway your hips and trunk so they move your right / left arm without any use of your right / left shoulder muscles.  Progress your movements so that your right / left arm moves side to side, then forward and backward, and finally, both clockwise and counterclockwise.  Complete __________ repetitions in each direction. Many people use this exercise to relieve discomfort in their shoulder as well as to gain range of motion. Repeat __________ times. Complete this exercise __________ times per day. STRETCH - Flexion, Standing  Stand with good posture. With an underhand grip on your right / left hand and an overhand grip on the opposite hand, grasp a broomstick or cane so that your hands are a little more than shoulder-width apart.  Keeping your right / left elbow straight and shoulder muscles relaxed, push the stick with your opposite hand to raise your right / left arm in front of your body and then overhead. Raise your arm until you feel a stretch in your right / left shoulder, but before you have increased shoulder pain.  Try to avoid shrugging your right / left shoulder as your arm rises by keeping your shoulder blade tucked down and toward your mid-back spine. Hold __________ seconds.  Slowly return to the starting position. Repeat __________ times. Complete this exercise __________ times per day. STRETCH - Internal Rotation  Place your right / left hand behind your back, palm-up.  Throw a towel or belt over your opposite shoulder. Grasp the towel/belt with your right / left hand.  While keeping an upright posture, gently pull up on the towel/belt until you feel a stretch in the front of your right / left shoulder.  Avoid shrugging your right / left shoulder as your arm rises by keeping your shoulder blade tucked down  and toward your mid-back spine.  Hold __________. Release the stretch by lowering your opposite hand. Repeat __________ times. Complete this exercise __________ times per day. STRETCH - External Rotation and Abduction  Stagger your stance through a doorframe. It does not matter which foot is forward.  As instructed by your physician, physical therapist or athletic trainer, place your hands:  And forearms above your head and on the door frame.  And forearms at head-height and on the door frame.  At elbow-height and on the door frame.  Keeping your head and chest upright and your stomach muscles tight to prevent over-extending your low-back, slowly shift your weight onto your front foot until you feel a stretch across your chest and/or in the front of your shoulders.  Hold __________ seconds. Shift your weight to your back foot to release the stretch. Repeat __________ times. Complete this stretch __________ times per day.  STRENGTHENING EXERCISES  These exercises may help you when beginning to rehabilitate your injury. They may resolve your symptoms with or without further involvement from your physician, physical therapist or athletic trainer. While completing these exercises, remember:   Muscles can gain both the endurance and the strength needed for everyday activities through controlled exercises.  Complete these exercises as  instructed by your physician, physical therapist or athletic trainer. Progress the resistance and repetitions only as guided.  You may experience muscle soreness or fatigue, but the pain or discomfort you are trying to eliminate should never worsen during these exercises. If this pain does worsen, stop and make certain you are following the directions exactly. If the pain is still present after adjustments, discontinue the exercise until you can discuss the trouble with your clinician.  If advised by your physician, during your recovery, avoid activity or  exercises which involve actions that place your right / left hand or elbow above your head or behind your back or head. These positions stress the tissues which are trying to heal. STRENGTH - Scapular Depression and Adduction  With good posture, sit on a firm chair. Supported your arms in front of you with pillows, arm rests or a table top. Have your elbows in line with the sides of your body.  Gently draw your shoulder blades down and toward your mid-back spine. Gradually increase the tension without tensing the muscles along the top of your shoulders and the back of your neck.  Hold for __________ seconds. Slowly release the tension and relax your muscles completely before completing the next repetition.  After you have practiced this exercise, remove the arm support and complete it in standing as well as sitting. Repeat __________ times. Complete this exercise __________ times per day.  STRENGTH - External Rotators  Secure a rubber exercise band/tubing to a fixed object so that it is at the same height as your right / left elbow when you are standing or sitting on a firm surface.  Stand or sit so that the secured exercise band/tubing is at your side that is not injured.  Bend your elbow 90 degrees. Place a folded towel or small pillow under your right / left arm so that your elbow is a few inches away from your side.  Keeping the tension on the exercise band/tubing, pull it away from your body, as if pivoting on your elbow. Be sure to keep your body steady so that the movement is only coming from your shoulder rotating.  Hold __________ seconds. Release the tension in a controlled manner as you return to the starting position. Repeat __________ times. Complete this exercise __________ times per day.  STRENGTH - Supraspinatus  Stand or sit with good posture. Grasp a __________ weight or an exercise band/tubing so that your hand is "thumbs-up," like when you shake hands.  Slowly lift your  right / left hand from your thigh into the air, traveling about 30 degrees from straight out at your side. Lift your hand to shoulder height or as far as you can without increasing any shoulder pain. Initially, many people do not lift their hands above shoulder height.  Avoid shrugging your right / left shoulder as your arm rises by keeping your shoulder blade tucked down and toward your mid-back spine.  Hold for __________ seconds. Control the descent of your hand as you slowly return to your starting position. Repeat __________ times. Complete this exercise __________ times per day.  STRENGTH - Shoulder Extensors  Secure a rubber exercise band/tubing so that it is at the height of your shoulders when you are either standing or sitting on a firm arm-less chair.  With a thumbs-up grip, grasp an end of the band/tubing in each hand. Straighten your elbows and lift your hands straight in front of you at shoulder height. Step back away from the  secured end of band/tubing until it becomes tense.  Squeezing your shoulder blades together, pull your hands down to the sides of your thighs. Do not allow your hands to go behind you.  Hold for __________ seconds. Slowly ease the tension on the band/tubing as you reverse the directions and return to the starting position. Repeat __________ times. Complete this exercise __________ times per day.  STRENGTH - Scapular Retractors  Secure a rubber exercise band/tubing so that it is at the height of your shoulders when you are either standing or sitting on a firm arm-less chair.  With a palm-down grip, grasp an end of the band/tubing in each hand. Straighten your elbows and lift your hands straight in front of you at shoulder height. Step back away from the secured end of band/tubing until it becomes tense.  Squeezing your shoulder blades together, draw your elbows back as you bend them. Keep your upper arm lifted away from your body throughout the  exercise.  Hold __________ seconds. Slowly ease the tension on the band/tubing as you reverse the directions and return to the starting position. Repeat __________ times. Complete this exercise __________ times per day. STRENGTH - Scapular Depressors  Find a sturdy chair without wheels, such as a from a dining room table.  Keeping your feet on the floor, lift your bottom from the seat and lock your elbows.  Keeping your elbows straight, allow gravity to pull your body weight down. Your shoulders will rise toward your ears.  Raise your body against gravity by drawing your shoulder blades down your back, shortening the distance between your shoulders and ears. Although your feet should always maintain contact with the floor, your feet should progressively support less body weight as you get stronger.  Hold __________ seconds. In a controlled and slow manner, lower your body weight to begin the next repetition. Repeat __________ times. Complete this exercise __________ times per day.  Document Released: 03/18/2005 Document Revised: 07/27/2011 Document Reviewed: 08/16/2008 [facility] Patient Information 2015 [place], [place]. This information is not intended to replace advice given to you by your health care provider. Make sure you discuss any questions you have with your health care provider.

## 2014-07-07 NOTE — Progress Notes (Signed)
Patient discussed and examined with Ms. English. Agree with assessment and plan of care per her note. In my exam appears to have impingement.  Risks (including but not limited to bleeding, damage to underlying tissues, and infection), benefits, and alternatives discussed for L subacromial injection.  Verbal consent obtained after any questions were answered. Landmarks noted, and marked as needed. Area cleansed with Betadine x3, ethyl chloride spray for topical anesthesia, followed by alcohol swab.  Injected with 1cc kenalog 40mg  and lidocaine 3cc 1%plain.  No complications. Bandage applied.  RTC precautions discussed in regards to injection.

## 2014-07-07 NOTE — Progress Notes (Signed)
[facility] [address] [place] [address] [phone]  Date:  07/07/2014   Name:  Marcus Hart   DOB:  1966-03-19   MRN:  [id number]  PCP:  No primary care provider on file.    Chief Complaint: Shoulder Pain   History of Present Illness:  Marcus Hart is a 49 y.o. very pleasant male patient who presents with the following: Patient reports over 4 months of shoulder pain.  Patient presented to our office about 8 weeks ago, with the same left shoulder pain.  Patient states that the pain continues, and will ake him from sleep if he rests on that left side.  He has felt numbness upon waking, twice within the last week, which is new.  This resolves moments after shifting position.  He states that it is at his front and side of his left shoulder.  He used an anti-inflammatory the first month, but stopped.  He stated that it helped very little so he quit.  He adamantly states that he has performed the shoulder exercises within the last month daily, with no relief.  He has used the flexeril, but not much because it makes him feel tired.  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  He reports that he may be changing positions where this will not be part of his job criteria.     Patient Active Problem List   Diagnosis Date Noted  . DM2 (diabetes mellitus, type 2) 02/13/2013  . Other and unspecified hyperlipidemia 02/13/2013    Past Medical History  Diagnosis Date  . Diabetes mellitus   . Hyperlipidemia     History reviewed. No pertinent past surgical history.  History  Substance Use Topics  . Smoking status: Never Smoker   . Smokeless tobacco: Not on file  . Alcohol Use: No    Family History  Problem Relation Age of Onset  . Diabetes Mother   . Cancer Mother     bladder  . Ulcers Father   . Benign prostatic hyperplasia Father     No Known Allergies  Medication list has been reviewed and updated.  Current Outpatient Prescriptions on File  Prior to Visit  Medication Sig Dispense Refill  . aspirin 81 MG tablet Take 81 mg by mouth daily.    . cyclobenzaprine (FLEXERIL) 5 MG tablet Take 1 tablet (5 mg total) by mouth at bedtime as needed for muscle spasms. 30 tablet 0  . lisinopril (PRINIVIL,ZESTRIL) 2.5 MG tablet TAKE 1 TABLET BY MOUTH EVERY DAY 90 tablet 3  . meloxicam (MOBIC) 7.5 MG tablet Take 1 tablet (7.5 mg total) by mouth 2 (two) times daily as needed for pain. Take with food, no other NSAIDs I ie alevel, ibuprofen, advil) 30 tablet 1  . metFORMIN (GLUCOPHAGE) 1000 MG tablet Take 1 tablet (1,000 mg total) by mouth 2 (two) times daily with a meal. 180 tablet 3  . rosuvastatin (CRESTOR) 10 MG tablet Take 1 tablet (10 mg total) by mouth daily. 90 tablet 3  . saxagliptin HCl (ONGLYZA) 5 MG TABS tablet Take 1 tablet (5 mg total) by mouth daily. 90 tablet 3   No current facility-administered medications on file prior to visit.    Review of Systems: ROS otherwise unremarkable unless listed above.  Physical Examination: Filed Vitals:   07/07/14 0826  BP: 110/85  Pulse: 71  Temp: 98.3 F (36.8 C)  Resp: 18   Filed Vitals:   07/07/14 0826  Weight:  176 lb (79.833 kg)   Body mass index is 24.56 kg/(m^2). Ideal Body Weight:    Physical Exam  Constitutional: He is oriented to person, place, and time. He appears well-developed and well-nourished.  Eyes: Conjunctivae are normal. Pupils are equal, round, and reactive to light.  Cardiovascular: Normal rate.   Pulmonary/Chest: Effort normal. No respiratory distress.  Musculoskeletal:  Upon inspection there is no swelling or boney alignment abnormality.  No cervical spinous tenderness. Normal neck ROM.  No boney tenderness along the clavicle.  Tenderness to proximal deltoid.  None at the bicipital groove though positive speed test.  Normal ROM of left shoulder, though external rotation is painful.  +Neer shoulder. Negative Yergason.  Neurological: He is alert and oriented to  person, place, and time.  Skin: Skin is warm and dry.  Psychiatric: He has a normal mood and affect. His behavior is normal.  Vitals reviewed.  Assessment and Plan:  Left shoulder pain - Plan: triamcinolone acetonide (KENALOG-40) injection 40 mg Patient has attempted rehabilitative efforts at home without success.  By hx, this appears to be due to overuse and inflammation likely at bursa..  We will attempt to treat the shoulder pain with injection.  This was performed by Dr. Carlota Raspberry.  Discussed risk/benefit of injection and discussed after care rehabilitation.  Exercises were discussed and handout given.  He will report back to clinic if injection did not improve the pain, where referral to ortho will be placed.   Ivar Drape, PA-C [facility] 2/21/20165:36 PM

## 2014-10-09 IMAGING — CR DG LUMBAR SPINE COMPLETE 4+V
5 series · 5 of 5 positions shown · non-contrast
Comparison: None.

CLINICAL DATA: Low back pain, hurt back last evening lifting
furniture

LUMBAR SPINE - COMPLETE 4+ VIEW

[AP]
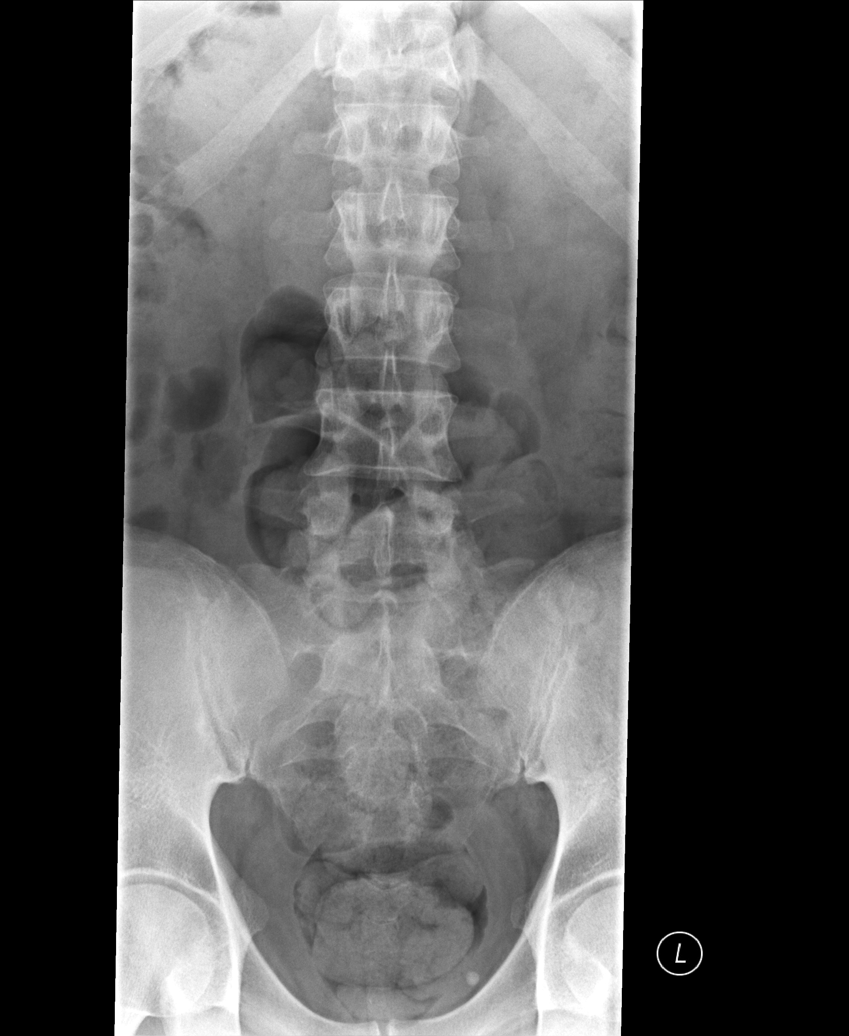

[rpo]
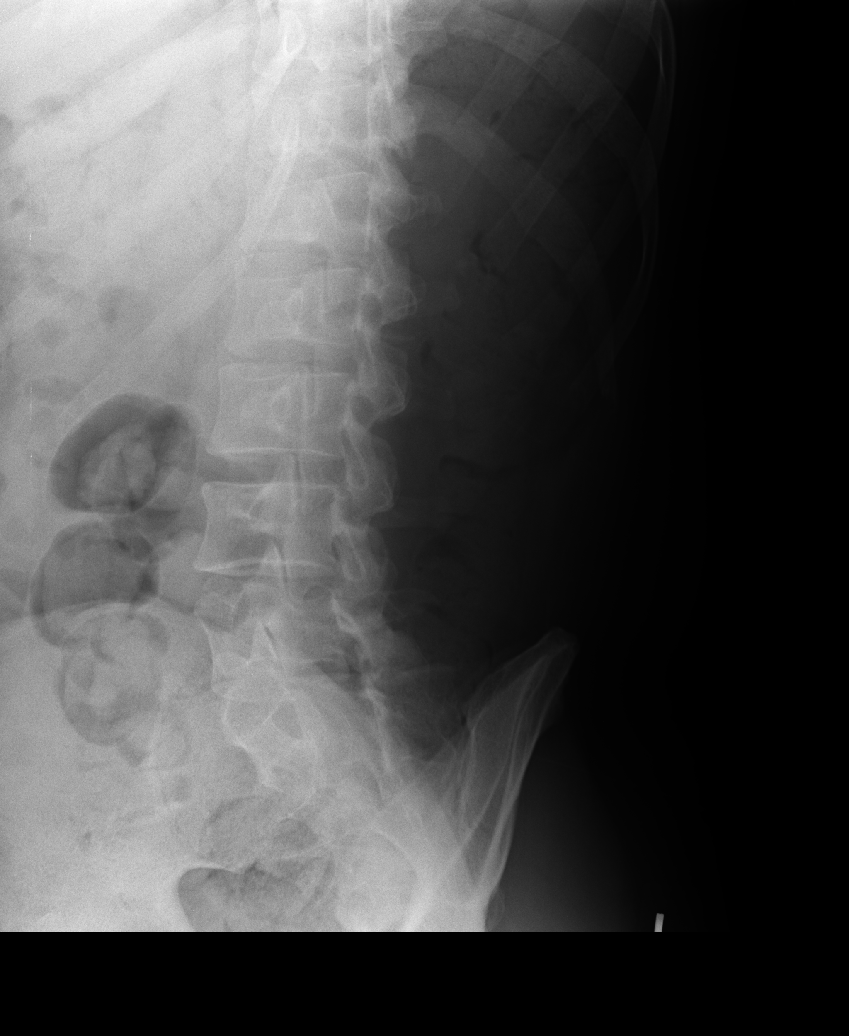

[lpo]
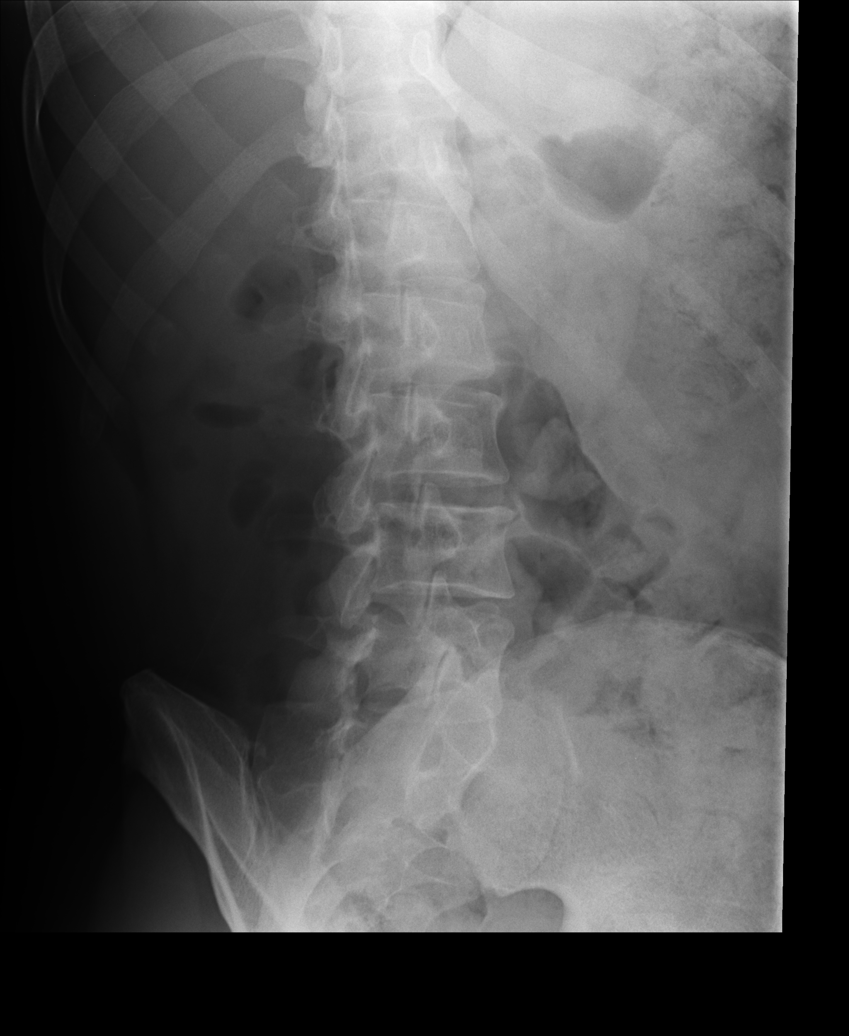

[lateral]
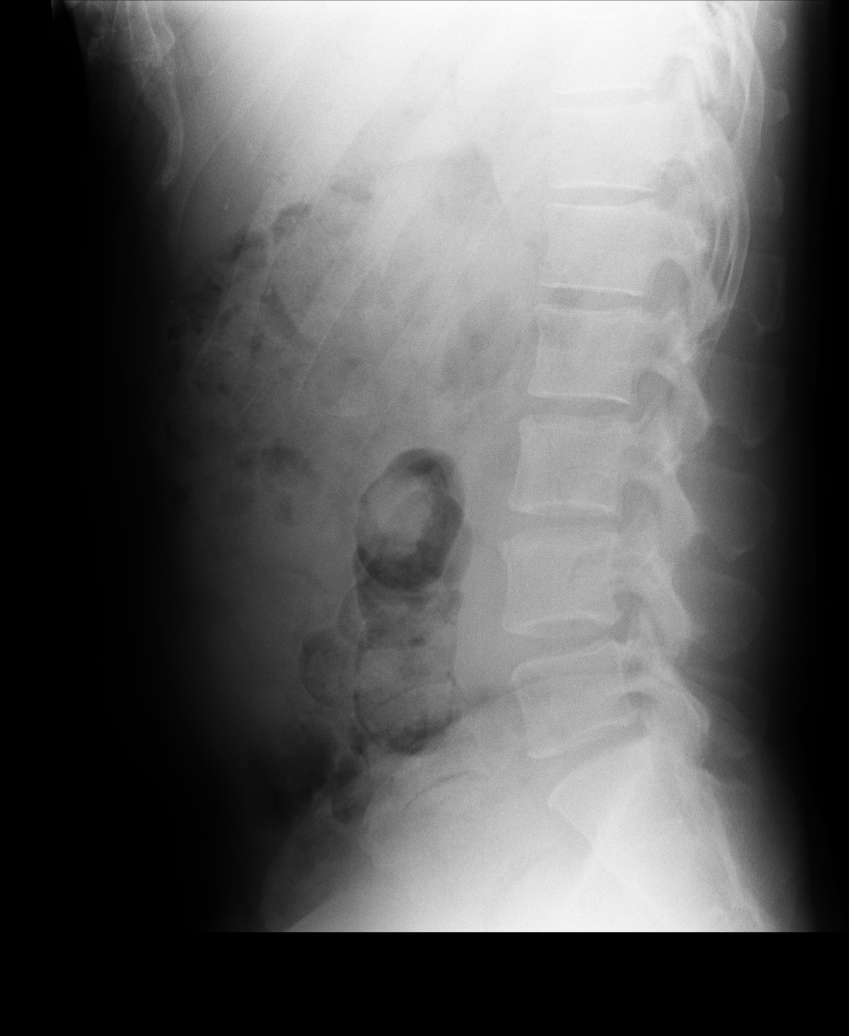

[l5 s1]
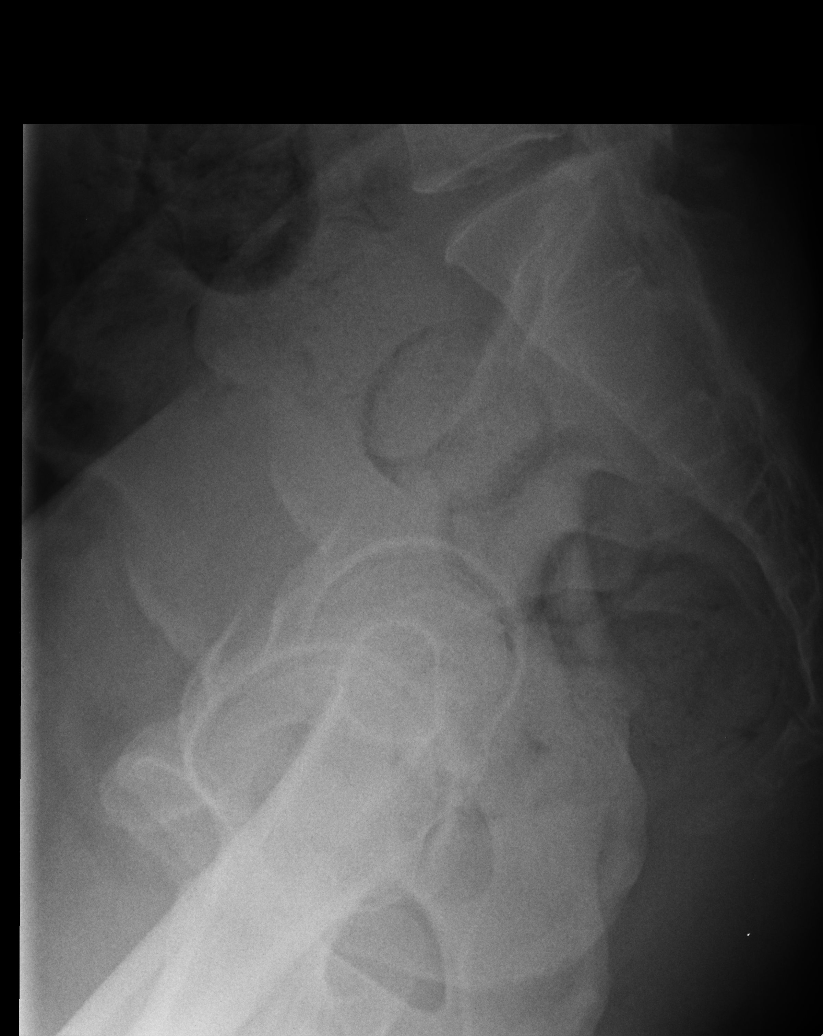

[5 of 5 positions shown; findings below may reference images not displayed]

FINDINGS: There are five non-rib bearing lumbar type vertebral bodies.
Normal alignment of the lumbar spine.  No definite pars defects.
No anterolisthesis or retrolisthesis.

Lumbar vertebral body heights are preserved.  Intervertebral disc
spaces are preserved.

Limited visualization of bilateral SI joints is normal.  A
phlebolith overlies the left hemi pelvis.  The regional bowel gas
pattern and soft tissues are normal.
IMPRESSION: Unremarkable lumbar spine radiographs.

## 2015-01-31 LAB — HM DIABETES EYE EXAM

## 2015-02-21 ENCOUNTER — Telehealth: Payer: Self-pay | Admitting: Family Medicine

## 2015-02-21 NOTE — Telephone Encounter (Signed)
Left a message for patient to return call to schedule appointment for diabetes check.  Find out when and where he went to the eye doctor?

## 2015-02-26 ENCOUNTER — Telehealth: Payer: Self-pay | Admitting: Family Medicine

## 2015-02-26 NOTE — Telephone Encounter (Signed)
Spoke with patient and he is coming in for a CPE on December 26 with Dr. Carlota Raspberry,.

## 2015-03-10 ENCOUNTER — Encounter: Payer: Self-pay | Admitting: Radiology

## 2015-03-19 ENCOUNTER — Telehealth: Payer: Self-pay | Admitting: Family Medicine

## 2015-03-19 NOTE — Telephone Encounter (Signed)
lmom for pt to call and reschedule his appt with Carlota Raspberry on 05/13/15 he will not be in the clinic that day

## 2015-04-06 ENCOUNTER — Ambulatory Visit (INDEPENDENT_AMBULATORY_CARE_PROVIDER_SITE_OTHER): Payer: 59 | Admitting: Family Medicine

## 2015-04-06 VITALS — BP 100/64 | HR 73 | Temp 98.4°F | Resp 16 | Ht 71.0 in | Wt 168.4 lb

## 2015-04-06 DIAGNOSIS — E1165 Type 2 diabetes mellitus with hyperglycemia: Secondary | ICD-10-CM | POA: Diagnosis not present

## 2015-04-06 DIAGNOSIS — E785 Hyperlipidemia, unspecified: Secondary | ICD-10-CM

## 2015-04-06 DIAGNOSIS — E119 Type 2 diabetes mellitus without complications: Secondary | ICD-10-CM | POA: Diagnosis not present

## 2015-04-06 DIAGNOSIS — IMO0001 Reserved for inherently not codable concepts without codable children: Secondary | ICD-10-CM

## 2015-04-06 DIAGNOSIS — Z23 Encounter for immunization: Secondary | ICD-10-CM

## 2015-04-06 LAB — COMPLETE METABOLIC PANEL WITH GFR
ALT: 12 U/L (ref 9–46)
AST: 14 U/L (ref 10–40)
Albumin: 4.4 g/dL (ref 3.6–5.1)
Alkaline Phosphatase: 54 U/L (ref 40–115)
BUN: 17 mg/dL (ref 7–25)
CO2: 28 mmol/L (ref 20–31)
Calcium: 9.8 mg/dL (ref 8.6–10.3)
Chloride: 100 mmol/L (ref 98–110)
Creat: 0.85 mg/dL (ref 0.60–1.35)
GFR, Est African American: >89 mL/min (ref >=60)
GFR, Est Non African American: >89 mL/min (ref >=60)
GLUCOSE: 127 mg/dL — AB (ref 65–99)
Potassium: 4.6 mmol/L (ref 3.5–5.3)
SODIUM: 134 mmol/L — AB (ref 135–146)
TOTAL PROTEIN: 7.3 g/dL (ref 6.1–8.1)
Total Bilirubin: 0.5 mg/dL (ref 0.2–1.2)

## 2015-04-06 LAB — LIPID PANEL
Cholesterol: 184 mg/dL (ref 125–200)
HDL: 76 mg/dL (ref >=40)
LDL CALC: 94 mg/dL (ref <130)
TRIGLYCERIDES: 71 mg/dL (ref <150)
Total CHOL/HDL Ratio: 2.4 Ratio (ref <=5.0)
VLDL: 14 mg/dL (ref <30)

## 2015-04-06 LAB — POCT GLYCOSYLATED HEMOGLOBIN (HGB A1C): Hemoglobin A1C: 8.7

## 2015-04-06 LAB — HEMOGLOBIN A1C: HEMOGLOBIN A1C: 8.7 % — AB (ref 4.0–6.0)

## 2015-04-06 LAB — GLUCOSE, POCT (MANUAL RESULT ENTRY): POC GLUCOSE: 130 mg/dL — AB (ref 70–99)

## 2015-04-06 LAB — MICROALBUMIN, URINE: Microalb, Ur: <0.2 mg/dL

## 2015-04-06 MED ORDER — LISINOPRIL 2.5 MG PO TABS
ORAL_TABLET | ORAL | Status: DC
Start: 2015-04-06 — End: 2015-07-19

## 2015-04-06 MED ORDER — SAXAGLIPTIN HCL 5 MG PO TABS: 5.0000 mg | ORAL_TABLET | Freq: Every day | ORAL | Status: DC

## 2015-04-06 MED ORDER — METFORMIN HCL 1000 MG PO TABS: 1000.0000 mg | ORAL_TABLET | Freq: Two times a day (BID) | ORAL | Status: DC

## 2015-04-06 MED ORDER — ROSUVASTATIN CALCIUM 10 MG PO TABS: 10.0000 mg | ORAL_TABLET | Freq: Every day | ORAL | Status: DC

## 2015-04-06 NOTE — Progress Notes (Addendum)
Subjective:  This chart was scribed for Marcus Ray, MD by [facility], medical scribe at [facility].The patient was seen in exam room 14 and the patient's care was started at 11:04 AM.   Patient ID: Marcus Hart, male    DOB: 08/19/1965, 49 y.o.   MRN: [id number] Chief Complaint  Patient presents with  . Diabetes    follow up   HPI HPI Comments:  Marcus Hart is a 49 y.o. male who presents to [facility] for a diabetes follow up. Fasting this morning. 6 year old son diagnosed with leukemia. Mentally doing ok.  Diabetes: Last visit with me was August of last year. At that time we reco follow up in 3-6 months as his A1C was controlled. He was continued on metformin 1000 mg once daily, onglyza 5 mg once daily. Aspirin 81 mg daily and lisinopril 2.5 mg daily. No meds for the past two weeks. In the morning blood sugar varies from 139-160. No symptomatic lows. Seen by optho 4 months ago. Dentist 6 months ago. Not exercising.  Lab Results  Component Value Date   HGBA1C 7.4 05/07/2014   Lab Results  Component Value Date   MICROALBUR <0.2 05/07/2014   HLD:  Taking crestor 10 mg daily, no side effects on this. Lab Results  Component Value Date   CHOL 146 01/01/2014   HDL 63 01/01/2014   LDLCALC 65 01/01/2014   TRIG 88 01/01/2014   CHOLHDL 2.3 01/01/2014   Immunization: Given the shot today. TDAP in 2014.  Patient Active Problem List   Diagnosis Date Noted  . DM2 (diabetes mellitus, type 2) ([place]) 02/13/2013  . Other and unspecified hyperlipidemia 02/13/2013   Past Medical History  Diagnosis Date  . Diabetes mellitus ([place])   . Hyperlipidemia    No past surgical history on file. No Known Allergies Prior to Admission medications   Medication Sig Start Date End Date Taking? Authorizing Provider  aspirin 81 MG tablet Take 81 mg by mouth daily.   Yes Historical Provider, MD  lisinopril (PRINIVIL,ZESTRIL) 2.5 MG tablet TAKE 1  TABLET BY MOUTH EVERY DAY 05/07/14  Yes Thao P Le, DO  metFORMIN (GLUCOPHAGE) 1000 MG tablet Take 1 tablet (1,000 mg total) by mouth 2 (two) times daily with a meal. 05/07/14 05/07/15 Yes Thao P Le, DO  rosuvastatin (CRESTOR) 10 MG tablet Take 1 tablet (10 mg total) by mouth daily. 05/07/14  Yes Thao P Le, DO  saxagliptin HCl (ONGLYZA) 5 MG TABS tablet Take 1 tablet (5 mg total) by mouth daily. 05/07/14  Yes Thao P Le, DO  meloxicam (MOBIC) 7.5 MG tablet Take 1 tablet (7.5 mg total) by mouth 2 (two) times daily as needed for pain. Take with food, no other NSAIDs I ie alevel, ibuprofen, advil) Patient not taking: Reported on 04/06/2015 05/07/14   Glenford Bayley, DO   Social History   Social History  . Marital Status: Married    Spouse Name: N/A  . Number of Children: N/A  . Years of Education: N/A   Occupational History  . Not on file.   Social History Main Topics  . Smoking status: Never Smoker   . Smokeless tobacco: Not on file  . Alcohol Use: No  . Drug Use: No  . Sexual Activity: Yes   Other Topics Concern  . Not on file   Social History Narrative   Review of Systems  Constitutional: Negative for fatigue and unexpected weight  change.  Eyes: Negative for visual disturbance.  Respiratory: Negative for cough, chest tightness and shortness of breath.   Cardiovascular: Negative for chest pain, palpitations and leg swelling.  Gastrointestinal: Negative for abdominal pain and blood in stool.  Neurological: Negative for dizziness, light-headedness and headaches.       Objective:  BP 100/64 mmHg  Pulse 73  Temp(Src) 98.4 F (36.9 C) (Oral)  Resp 16  Ht _0  (1.803 m)  Wt 168 lb 6.4 oz (76.386 kg)  BMI 23.50 kg/m2  SpO2 98% Physical Exam  Constitutional: He is oriented to person, place, and time. He appears well-developed and well-nourished. No distress.  HENT:  Head: Normocephalic and atraumatic.  Eyes: Pupils are equal, round, and reactive to light.  Neck: Normal range  of motion.  Cardiovascular: Normal rate, regular rhythm, normal heart sounds and intact distal pulses.   Pulmonary/Chest: Effort normal and breath sounds normal. No respiratory distress.  Abdominal: Soft. There is no tenderness.  Musculoskeletal: Normal range of motion.  Normal microfilament test on both feet.  Neurological: He is alert and oriented to person, place, and time.  Microfilament testing of feet normal bilaterally.  Skin: Skin is warm, dry and intact. No rash noted.  Psychiatric: He has a normal mood and affect. His behavior is normal.  Nursing note and vitals reviewed.  Results for orders placed or performed in visit on 04/06/15  POCT glucose (manual entry)  Result Value Ref Range   POC Glucose 130 (A) 70 - 99 mg/dl  POCT glycosylated hemoglobin (Hb A1C)  Result Value Ref Range   Hemoglobin A1C 8.7         Assessment & Plan:   Marcus Hart is a 49 y.o. male  Uncontrolled type 2 diabetes mellitus without complication, without long-term current use of insulin (HCC) - Plan: lisinopril (PRINIVIL,ZESTRIL) 2.5 MG tablet, metFORMIN (GLUCOPHAGE) 1000 MG tablet, rosuvastatin (CRESTOR) 10 MG tablet  -Decreased control likely due to being off meds past 2 weeks. Previously had been under pretty good control, so we'll continue same dose of metformin and Onglyza at this time. Recheck is planned in January at physical. Can keep a record of home blood sugars to bring to that visit.  -Urine microalbumin pending  Need for prophylactic vaccination and inoculation against influenza - Plan: Flu Vaccine QUAD 36+ mos IM given  Hyperlipidemia - Plan: Lipid panel, COMPLETE METABOLIC PANEL WITH GFR, rosuvastatin (CRESTOR) 10 MG tablet, saxagliptin HCl (ONGLYZA) 5 MG TABS tablet  -Continue Crestor same dose, lipid panel and CMP pending     Meds ordered this encounter  Medications  . lisinopril (PRINIVIL,ZESTRIL) 2.5 MG tablet    Sig: TAKE 1 TABLET BY MOUTH EVERY DAY    Dispense:  90  tablet    Refill:  3  . metFORMIN (GLUCOPHAGE) 1000 MG tablet    Sig: Take 1 tablet (1,000 mg total) by mouth 2 (two) times daily with a meal.    Dispense:  180 tablet    Refill:  1  . rosuvastatin (CRESTOR) 10 MG tablet    Sig: Take 1 tablet (10 mg total) by mouth daily.    Dispense:  90 tablet    Refill:  1  . saxagliptin HCl (ONGLYZA) 5 MG TABS tablet    Sig: Take 1 tablet (5 mg total) by mouth daily.    Dispense:  90 tablet    Refill:  1   Patient Instructions  You should receive a call or letter about your lab results within  the next week to 10 days.   Please let me know if I can help with stress or difficulty with your child's llness.  Your diabetes control is worsened, but I suspect some of this is just being off medicines past few weeks. Restart metformin twice per day, Onglyza once per day, and keep follow-up with me for the physical, and we can recheck your blood sugar at that time as well as discuss any other health maintenance needed items.    Diabetes and Standards of Medical Care Diabetes is complicated. You may find that your diabetes team includes a dietitian, nurse, diabetes educator, eye doctor, and more. To help everyone know what is going on and to help you get the care you deserve, the following schedule of care was developed to help keep you on track. Below are the tests, exams, vaccines, medicines, education, and plans you will need. HbA1c test This test shows how well you have controlled your glucose over the past 2-3 months. It is used to see if your diabetes management plan needs to be adjusted.   It is performed at least 2 times a year if you are meeting treatment goals.  It is performed 4 times a year if therapy has changed or if you are not meeting treatment goals. Blood pressure test  This test is performed at every routine medical visit. The goal is less than 140/90 mm Hg for most people, but 130/80 mm Hg in some cases. Ask your health care provider  about your goal. Dental exam  Follow up with the dentist regularly. Eye exam  If you are diagnosed with type 1 diabetes as a child, get an exam upon reaching the age of 46 years or older and having had diabetes for 3-5 years. Yearly eye exams are recommended after that initial eye exam.  If you are diagnosed with type 1 diabetes as an adult, get an exam within 5 years of diagnosis and then yearly.  If you are diagnosed with type 2 diabetes, get an exam as soon as possible after the diagnosis and then yearly. Foot care exam  Visual foot exams are performed at every routine medical visit. The exams check for cuts, injuries, or other problems with the feet.  You should have a complete foot exam performed every year. This exam includes an inspection of the structure and skin of your feet, a check of the pulses in your feet, and a check of the sensation in your feet.  Type 1 diabetes: The first exam is performed 5 years after diagnosis.  Type 2 diabetes: The first exam is performed at the time of diagnosis.  Check your feet nightly for cuts, injuries, or other problems with your feet. Tell your health care provider if anything is not healing. Kidney function test (urine microalbumin)  This test is performed once a year.  Type 1 diabetes: The first test is performed 5 years after diagnosis.  Type 2 diabetes: The first test is performed at the time of diagnosis.  A serum creatinine and estimated glomerular filtration rate (eGFR) test is done once a year to assess the level of chronic kidney disease (CKD), if present. Lipid profile (cholesterol, HDL, LDL, triglycerides)  Performed every 5 years for most people.  The goal for LDL is less than 100 mg/dL. If you are at high risk, the goal is less than 70 mg/dL.  The goal for HDL is 40 mg/dL-50 mg/dL for men and 50 mg/dL-60 mg/dL for women. An HDL cholesterol  of 60 mg/dL or higher gives some protection against heart disease.  The goal for  triglycerides is less than 150 mg/dL. Immunizations  The flu (influenza) vaccine is recommended yearly for every person 10 months of age or older who has diabetes.  The pneumonia (pneumococcal) vaccine is recommended for every person 15 years of age or older who has diabetes. Adults 18 years of age or older may receive the pneumonia vaccine as a series of two separate shots.  The hepatitis B vaccine is recommended for adults shortly after they have been diagnosed with diabetes.  The Tdap (tetanus, diphtheria, and pertussis) vaccine should be given:  According to normal childhood vaccination schedules, for children.  Every 10 years, for adults who have diabetes. Diabetes self-management education  Education is recommended at diagnosis and ongoing as needed. Treatment plan  Your treatment plan is reviewed at every medical visit.   This information is not intended to replace advice given to you by your health care provider. Make sure you discuss any questions you have with your health care provider.   Document Released: 03/01/2009 Document Revised: 05/25/2014 Document Reviewed: 10/04/2012 Elsevier Interactive Patient Education [employer].     I personally performed the services described in this documentation, which was scribed in my presence. The recorded information has been reviewed and considered, and addended by me as needed.      By signing my name below, I, Nadim Abuhashem, attest that this documentation has been prepared under the direction and in the presence of Marcus Ray, MD.  Electronically Signed: Lora Havens, medical scribe. 04/06/2015, 10:45 AM.

## 2015-04-06 NOTE — Patient Instructions (Addendum)
You should receive a call or letter about your lab results within the next week to 10 days.   Please let me know if I can help with stress or difficulty with your child's llness.  Your diabetes control is worsened, but I suspect some of this is just being off medicines past few weeks. Restart metformin twice per day, Onglyza once per day, and keep follow-up with me for the physical, and we can recheck your blood sugar at that time as well as discuss any other health maintenance needed items.    Diabetes and Standards of Medical Care Diabetes is complicated. You may find that your diabetes team includes a dietitian, nurse, diabetes educator, eye doctor, and more. To help everyone know what is going on and to help you get the care you deserve, the following schedule of care was developed to help keep you on track. Below are the tests, exams, vaccines, medicines, education, and plans you will need. HbA1c test This test shows how well you have controlled your glucose over the past 2-3 months. It is used to see if your diabetes management plan needs to be adjusted.   It is performed at least 2 times a year if you are meeting treatment goals.  It is performed 4 times a year if therapy has changed or if you are not meeting treatment goals. Blood pressure test  This test is performed at every routine medical visit. The goal is less than 140/90 mm Hg for most people, but 130/80 mm Hg in some cases. Ask your health care provider about your goal. Dental exam  Follow up with the dentist regularly. Eye exam  If you are diagnosed with type 1 diabetes as a child, get an exam upon reaching the age of 23 years or older and having had diabetes for 3-5 years. Yearly eye exams are recommended after that initial eye exam.  If you are diagnosed with type 1 diabetes as an adult, get an exam within 5 years of diagnosis and then yearly.  If you are diagnosed with type 2 diabetes, get an exam as soon as possible  after the diagnosis and then yearly. Foot care exam  Visual foot exams are performed at every routine medical visit. The exams check for cuts, injuries, or other problems with the feet.  You should have a complete foot exam performed every year. This exam includes an inspection of the structure and skin of your feet, a check of the pulses in your feet, and a check of the sensation in your feet.  Type 1 diabetes: The first exam is performed 5 years after diagnosis.  Type 2 diabetes: The first exam is performed at the time of diagnosis.  Check your feet nightly for cuts, injuries, or other problems with your feet. Tell your health care provider if anything is not healing. Kidney function test (urine microalbumin)  This test is performed once a year.  Type 1 diabetes: The first test is performed 5 years after diagnosis.  Type 2 diabetes: The first test is performed at the time of diagnosis.  A serum creatinine and estimated glomerular filtration rate (eGFR) test is done once a year to assess the level of chronic kidney disease (CKD), if present. Lipid profile (cholesterol, HDL, LDL, triglycerides)  Performed every 5 years for most people.  The goal for LDL is less than 100 mg/dL. If you are at high risk, the goal is less than 70 mg/dL.  The goal for HDL is 40 mg/dL-50  mg/dL for men and 50 mg/dL-60 mg/dL for women. An HDL cholesterol of 60 mg/dL or higher gives some protection against heart disease.  The goal for triglycerides is less than 150 mg/dL. Immunizations  The flu (influenza) vaccine is recommended yearly for every person 6 months of age or older who has diabetes.  The pneumonia (pneumococcal) vaccine is recommended for every person 2 years of age or older who has diabetes. Adults 65 years of age or older may receive the pneumonia vaccine as a series of two separate shots.  The hepatitis B vaccine is recommended for adults shortly after they have been diagnosed with  diabetes.  The Tdap (tetanus, diphtheria, and pertussis) vaccine should be given:  According to normal childhood vaccination schedules, for children.  Every 10 years, for adults who have diabetes. Diabetes self-management education  Education is recommended at diagnosis and ongoing as needed. Treatment plan  Your treatment plan is reviewed at every medical visit.   This information is not intended to replace advice given to you by your health care provider. Make sure you discuss any questions you have with your health care provider.   Document Released: 03/01/2009 Document Revised: 05/25/2014 Document Reviewed: 10/04/2012 Elsevier Interactive Patient Education 2016 Elsevier Inc.  

## 2015-04-17 ENCOUNTER — Ambulatory Visit (INDEPENDENT_AMBULATORY_CARE_PROVIDER_SITE_OTHER): Payer: 59 | Admitting: Family Medicine

## 2015-04-17 ENCOUNTER — Encounter: Payer: Self-pay | Admitting: Family Medicine

## 2015-04-17 VITALS — BP 100/70 | HR 93 | Temp 102.5°F | Resp 18 | Ht 71.0 in | Wt 164.0 lb

## 2015-04-17 DIAGNOSIS — R51 Headache: Secondary | ICD-10-CM

## 2015-04-17 DIAGNOSIS — R519 Headache, unspecified: Secondary | ICD-10-CM

## 2015-04-17 DIAGNOSIS — D61818 Other pancytopenia: Secondary | ICD-10-CM

## 2015-04-17 DIAGNOSIS — R509 Fever, unspecified: Secondary | ICD-10-CM

## 2015-04-17 LAB — POCT INFLUENZA A/B
INFLUENZA A, POC: NEGATIVE
Influenza B, POC: NEGATIVE

## 2015-04-17 LAB — POCT CBC
GRANULOCYTE PERCENT: 77.6 % (ref 37–80)
HEMATOCRIT: 38.9 % — AB (ref 43.5–53.7)
Hemoglobin: 13 g/dL — AB (ref 14.1–18.1)
LYMPH, POC: 0.3 — AB (ref 0.6–3.4)
MCH, POC: 27 pg (ref 27–31.2)
MCHC: 33.6 g/dL (ref 31.8–35.4)
MCV: 80.5 fL (ref 80–97)
MID (cbc): 0.1 (ref 0–0.9)
MPV: 6.3 fL (ref 0–99.8)
POC GRANULOCYTE: 1.2 — AB (ref 2–6.9)
POC LYMPH %: 18.6 % (ref 10–50)
POC MID %: 3.8 % (ref 0–12)
Platelet Count, POC: 99 10*3/uL — AB (ref 142–424)
RBC: 4.83 M/uL (ref 4.69–6.13)
RDW, POC: 13.1 %
WBC: 1.6 10*3/uL — AB (ref 4.6–10.2)

## 2015-04-17 MED ORDER — ACETAMINOPHEN 325 MG PO TABS
1000.0000 mg | ORAL_TABLET | Freq: Once | ORAL | Status: AC
  Administered 2015-04-17: 975 mg via ORAL

## 2015-04-17 NOTE — Patient Instructions (Addendum)
Your flu test in the office was negative, but the blood count appears that you do have the flu or a flulike illness (or another viral illness).   Hand washing is very important and wear a mask around your son to lessen chance of transmission to him. Continue Tylenol or Motrin as needed for the fever and body aches. You should stay out of work until your fever has resolved for 24 hours (and this should be off of fever reducing medicines such as Tylenol or ibuprofen). Take plenty of fluids, rest as needed, and if not starting to improve in the next few days, return for recheck. Sooner if worsening.   Some of your blood counts were low today such as your white blood cell count, hemoglobin, and platelets. I suspect this is reactive to the current infection, but still recommend repeating these blood counts in the next 2 weeks as your symptoms improve.  Return to the clinic or go to the nearest emergency room if any of your symptoms worsen or new symptoms occur.   Fever, Adult A fever is an increase in the body's temperature. It is usually defined as a temperature of 100F (38C) or higher. Brief mild or moderate fevers generally have no long-term effects, and they often do not require treatment. Moderate or high fevers may make you feel uncomfortable and can sometimes be a sign of a serious illness or disease. The sweating that may occur with repeated or prolonged fever may also cause dehydration. Fever is confirmed by taking a temperature with a thermometer. A measured temperature can vary with:  Age.  Time of day.  Location of the thermometer:  Mouth (oral).  Rectum (rectal).  Ear (tympanic).  Underarm (axillary).  Forehead (temporal). HOME CARE INSTRUCTIONS Pay attention to any changes in your symptoms. Take these actions to help with your condition:  Take over-the counter and prescription medicines only as told by your health care provider. Follow the dosing instructions  carefully.  If you were prescribed an antibiotic medicine, take it as told by your health care provider. Do not stop taking the antibiotic even if you start to feel better.  Rest as needed.  Drink enough fluid to keep your urine clear or pale yellow. This helps to prevent dehydration.  Sponge yourself or bathe with room-temperature water to help reduce your body temperature as needed. Do not use ice water.  Do not overbundle yourself in blankets or heavy clothes. SEEK MEDICAL CARE IF:  You vomit.  You cannot eat or drink without vomiting.  You have diarrhea.  You have pain when you urinate.  Your symptoms do not improve with treatment.  You develop new symptoms.  You develop excessive weakness. SEEK IMMEDIATE MEDICAL CARE IF:  You have shortness of breath or have trouble breathing.  You are dizzy or you faint.  You are disoriented or confused.  You develop signs of dehydration, such as a dry mouth, decreased urination, or paleness.  You develop severe pain in your abdomen.  You have persistent vomiting or diarrhea.  You develop a skin rash.  Your symptoms suddenly get worse.   This information is not intended to replace advice given to you by your health care provider. Make sure you discuss any questions you have with your health care provider.   Document Released: 10/28/2000 Document Revised: 01/23/2015 Document Reviewed: 06/28/2014 Elsevier Interactive Patient Education [employer].

## 2015-04-17 NOTE — Addendum Note (Signed)
Addended by: Carlota Raspberry, Falesha Schommer R on: 04/17/2015 01:10 PM   Modules accepted: Level of Service

## 2015-04-17 NOTE — Progress Notes (Addendum)
Subjective:    Patient ID: Marcus Hart, male    DOB: 11/25/1965, 49 y.o.   MRN: [id number]  HPI Marcus Hart is a 49 y.o. male  History of hyperlipidemia and type 2 diabetes. Here with fever bodyaches and chills. This started 4 days ago. He received a flu vaccine on November 19 (11 days ago).  Initially bodyache, chills, temp 99-100.  Temp 102 this morning. No cough, no congestion or runny nose. Just fever, headache and bodyache. No neck pain or stiffness.  No photophobia or phonophobia. No rash. No recent tick bites/hiking/camping.   Sick contacts: 32yo daughter sick with cough and congestion 2 weeks ago - no known fever. She improved in 1 week.  No recent foreign travel.   His son is undergoing chemotherapy for leukemia. Marcus Hart has been using a mask around him.    Treatment:  tylenol every 6 hours, able to still work.    Patient Active Problem List   Diagnosis Date Noted  . DM2 (diabetes mellitus, type 2) ([place]) 02/13/2013  . Other and unspecified hyperlipidemia 02/13/2013   Past Medical History  Diagnosis Date  . Diabetes mellitus ([place])   . Hyperlipidemia    No past surgical history on file. No Known Allergies Prior to Admission medications   Medication Sig Start Date End Date Taking? Authorizing Provider  aspirin 81 MG tablet Take 81 mg by mouth daily.   Yes Historical Provider, MD  lisinopril (PRINIVIL,ZESTRIL) 2.5 MG tablet TAKE 1 TABLET BY MOUTH EVERY DAY 04/06/15  Yes [name], MD  metFORMIN (GLUCOPHAGE) 1000 MG tablet Take 1 tablet (1,000 mg total) by mouth 2 (two) times daily with a meal. 04/06/15 04/05/16 Yes [name], MD  rosuvastatin (CRESTOR) 10 MG tablet Take 1 tablet (10 mg total) by mouth daily. 04/06/15  Yes [name], MD  saxagliptin HCl (ONGLYZA) 5 MG TABS tablet Take 1 tablet (5 mg total) by mouth daily. Patient not taking: Reported on 04/17/2015 04/06/15   [name], MD   Social History   Social History  . Marital  Status: Married    Spouse Name: N/A  . Number of Children: N/A  . Years of Education: N/A   Occupational History  . Not on file.   Social History Main Topics  . Smoking status: Never Smoker   . Smokeless tobacco: Not on file  . Alcohol Use: No  . Drug Use: No  . Sexual Activity: Yes   Other Topics Concern  . Not on file   Social History Narrative       Review of Systems  Constitutional: Positive for fever and chills.  HENT: Negative for congestion, mouth sores, sore throat and trouble swallowing.   Respiratory: Negative for cough and chest tightness.   Cardiovascular: Negative for chest pain.  Gastrointestinal: Negative for nausea, vomiting, abdominal pain and diarrhea.  Genitourinary: Negative for dysuria and difficulty urinating.  Musculoskeletal: Positive for myalgias and arthralgias. Negative for neck stiffness.  Skin: Negative for rash.  Neurological: Positive for headaches. Negative for dizziness and syncope.       Objective:   Physical Exam  Constitutional: He is oriented to person, place, and time. He appears well-developed and well-nourished. No distress.  Appears uncomfortable, but nontoxic.   HENT:  Head: Normocephalic and atraumatic.  Right Ear: Tympanic membrane, external ear and ear canal normal.  Left Ear: Tympanic membrane, external ear and ear canal normal.  Nose: No rhinorrhea.  Mouth/Throat: Oropharynx is clear and moist and  mucous membranes are normal. No oropharyngeal exudate or posterior oropharyngeal erythema.  Eyes: Conjunctivae are normal. Pupils are equal, round, and reactive to light.  Neck: Neck supple. No rigidity. Normal range of motion present. No Brudzinski's sign noted.  Cardiovascular: Normal rate, regular rhythm, normal heart sounds and intact distal pulses.   No murmur heard. Pulmonary/Chest: Effort normal and breath sounds normal. He has no wheezes. He has no rhonchi. He has no rales.  Abdominal: Soft. Bowel sounds are normal.  There is no tenderness.  Lymphadenopathy:    He has no cervical adenopathy.  Neurological: He is alert and oriented to person, place, and time.  Skin: Skin is warm and dry. No rash noted.  Psychiatric: He has a normal mood and affect. His behavior is normal.  Vitals reviewed.  Filed Vitals:   04/17/15 1114  BP: 100/70  Pulse: 93  Temp: 102.5 F (39.2 C)  TempSrc: Oral  Resp: 18  Height: 5\' 11"  (1.803 m)  Weight: 164 lb (74.39 kg)  SpO2: 95%    Results for orders placed or performed in visit on 04/17/15  POCT Influenza A/B  Result Value Ref Range   Influenza A, POC Negative Negative   Influenza B, POC Negative Negative  POCT CBC  Result Value Ref Range   WBC 1.6 (A) 4.6 - 10.2 K/uL   Lymph, poc 0.3 (A) 0.6 - 3.4   POC LYMPH PERCENT 18.6 10 - 50 %L   MID (cbc) 0.1 0 - 0.9   POC MID % 3.8 0 - 12 %M   POC Granulocyte 1.2 (A) 2 - 6.9   Granulocyte percent 77.6 37 - 80 %G   RBC 4.83 4.69 - 6.13 M/uL   Hemoglobin 13.0 (A) 14.1 - 18.1 g/dL   HCT, POC 38.9 (A) 43.5 - 53.7 %   MCV 80.5 80 - 97 fL   MCH, POC 27.0 27 - 31.2 pg   MCHC 33.6 31.8 - 35.4 g/dL   RDW, POC 13.1 %   Platelet Count, POC 99 (A) 142 - 424 K/uL   MPV 6.3 0 - 99.8 fL       Assessment & Plan:   Marcus Hart is a 49 y.o. male Fever, unspecified - Plan: POCT Influenza A/B, acetaminophen (TYLENOL) tablet 975 mg, POCT CBC  Nonintractable headache, unspecified chronicity pattern, unspecified headache type - Plan: POCT Influenza A/B, acetaminophen (TYLENOL) tablet 975 mg, POCT CBC  Suspected viral illness with sick contact at home, and shift and white blood cell coming platelets. A focal nidus of infection, lungs are clear, abdomen soft nontender, no urinary symptoms, no foreign travel, no tick exposure.   -Continue symptomatic care with Tylenol or other antipyretic. Out of work until fever free for 24 hours off of these medications. Repeat CBC in the next few weeks as symptoms improve. RTC/ER precautions if  any worsening. Understanding expressed.  -Family member at home on chemotherapy. Advised to use mask at all times, frequent handwashing, and limit contact with this family member until fever free for 24 hours. He can also call his child's doctor to see if they would want him on Tamiflu/prophylaxis.   Meds ordered this encounter  Medications  . acetaminophen (TYLENOL) tablet 975 mg    Sig:    Patient Instructions  Your flu test in the office was negative, but the blood count appears that you do have the flu or a flulike illness (or another viral illness).   Hand washing is very important and wear a mask  around your son to lessen chance of transmission to him. Continue Tylenol or Motrin as needed for the fever and body aches. You should stay out of work until your fever has resolved for 24 hours (and this should be off of fever reducing medicines such as Tylenol or ibuprofen). Take plenty of fluids, rest as needed, and if not starting to improve in the next few days, return for recheck. Sooner if worsening  Return to the clinic or go to the nearest emergency room if any of your symptoms worsen or new symptoms occur.   Fever, Adult A fever is an increase in the body's temperature. It is usually defined as a temperature of 100F (38C) or higher. Brief mild or moderate fevers generally have no long-term effects, and they often do not require treatment. Moderate or high fevers may make you feel uncomfortable and can sometimes be a sign of a serious illness or disease. The sweating that may occur with repeated or prolonged fever may also cause dehydration. Fever is confirmed by taking a temperature with a thermometer. A measured temperature can vary with:  Age.  Time of day.  Location of the thermometer:  Mouth (oral).  Rectum (rectal).  Ear (tympanic).  Underarm (axillary).  Forehead (temporal). HOME CARE INSTRUCTIONS Pay attention to any changes in your symptoms. Take these actions to  help with your condition:  Take over-the counter and prescription medicines only as told by your health care provider. Follow the dosing instructions carefully.  If you were prescribed an antibiotic medicine, take it as told by your health care provider. Do not stop taking the antibiotic even if you start to feel better.  Rest as needed.  Drink enough fluid to keep your urine clear or pale yellow. This helps to prevent dehydration.  Sponge yourself or bathe with room-temperature water to help reduce your body temperature as needed. Do not use ice water.  Do not overbundle yourself in blankets or heavy clothes. SEEK MEDICAL CARE IF:  You vomit.  You cannot eat or drink without vomiting.  You have diarrhea.  You have pain when you urinate.  Your symptoms do not improve with treatment.  You develop new symptoms.  You develop excessive weakness. SEEK IMMEDIATE MEDICAL CARE IF:  You have shortness of breath or have trouble breathing.  You are dizzy or you faint.  You are disoriented or confused.  You develop signs of dehydration, such as a dry mouth, decreased urination, or paleness.  You develop severe pain in your abdomen.  You have persistent vomiting or diarrhea.  You develop a skin rash.  Your symptoms suddenly get worse.   This information is not intended to replace advice given to you by your health care provider. Make sure you discuss any questions you have with your health care provider.   Document Released: 10/28/2000 Document Revised: 01/23/2015 Document Reviewed: 06/28/2014 Elsevier Interactive Patient Education [employer].     I personally performed the services described in this documentation, which was scribed in my presence. The recorded information has been reviewed and considered, and addended by me as needed.

## 2015-05-07 ENCOUNTER — Telehealth: Payer: Self-pay

## 2015-05-07 NOTE — Telephone Encounter (Signed)
PA completed for crestor 10 mg on covermymeds. Pt had tried/failed pravastatin starting in 12/2007. Changed to crestor 06/04/09 when pravastatin was not controlling his cholesterol. Crestor has been effective. PA pending.

## 2015-05-08 NOTE — Telephone Encounter (Signed)
PA came back stating that rosuvastatin is a covered med and pt will be able to fill a Rx for it w/out a PA. Sent letter to pharm.

## 2015-05-13 ENCOUNTER — Encounter: Payer: Self-pay | Admitting: Family Medicine

## 2015-06-08 ENCOUNTER — Other Ambulatory Visit: Payer: Self-pay | Admitting: Family Medicine

## 2015-06-17 ENCOUNTER — Encounter: Payer: 59 | Admitting: Family Medicine

## 2015-07-19 ENCOUNTER — Other Ambulatory Visit: Payer: Self-pay | Admitting: Family Medicine

## 2015-07-24 ENCOUNTER — Encounter: Payer: Self-pay | Admitting: Family Medicine

## 2015-07-24 ENCOUNTER — Ambulatory Visit (INDEPENDENT_AMBULATORY_CARE_PROVIDER_SITE_OTHER): Payer: BLUE CROSS/BLUE SHIELD | Admitting: Family Medicine

## 2015-07-24 VITALS — BP 120/80 | HR 56 | Temp 98.0°F | Resp 16 | Ht 71.0 in | Wt 169.4 lb

## 2015-07-24 DIAGNOSIS — E1165 Type 2 diabetes mellitus with hyperglycemia: Secondary | ICD-10-CM

## 2015-07-24 DIAGNOSIS — Z0000 Encounter for general adult medical examination without abnormal findings: Secondary | ICD-10-CM

## 2015-07-24 DIAGNOSIS — Z8052 Family history of malignant neoplasm of bladder: Secondary | ICD-10-CM | POA: Diagnosis not present

## 2015-07-24 DIAGNOSIS — Z125 Encounter for screening for malignant neoplasm of prostate: Secondary | ICD-10-CM

## 2015-07-24 DIAGNOSIS — E785 Hyperlipidemia, unspecified: Secondary | ICD-10-CM | POA: Diagnosis not present

## 2015-07-24 DIAGNOSIS — Z114 Encounter for screening for human immunodeficiency virus [HIV]: Secondary | ICD-10-CM

## 2015-07-24 DIAGNOSIS — IMO0001 Reserved for inherently not codable concepts without codable children: Secondary | ICD-10-CM

## 2015-07-24 LAB — POCT URINALYSIS DIP (MANUAL ENTRY)
Bilirubin, UA: NEGATIVE
Glucose, UA: NEGATIVE
Ketones, POC UA: NEGATIVE
LEUKOCYTES UA: NEGATIVE
NITRITE UA: NEGATIVE
PH UA: 7
PROTEIN UA: NEGATIVE
RBC UA: NEGATIVE
Spec Grav, UA: 1.01
UROBILINOGEN UA: 0.2

## 2015-07-24 LAB — HIV ANTIBODY (ROUTINE TESTING W REFLEX): HIV: NONREACTIVE

## 2015-07-24 MED ORDER — ROSUVASTATIN CALCIUM 10 MG PO TABS: 10.0000 mg | ORAL_TABLET | Freq: Every day | ORAL | Status: DC

## 2015-07-24 MED ORDER — SAXAGLIPTIN HCL 5 MG PO TABS: 5.0000 mg | ORAL_TABLET | Freq: Every day | ORAL | Status: DC

## 2015-07-24 MED ORDER — LISINOPRIL 2.5 MG PO TABS: 2.5000 mg | ORAL_TABLET | Freq: Every day | ORAL | Status: DC

## 2015-07-24 MED ORDER — METFORMIN HCL 1000 MG PO TABS: 1000.0000 mg | ORAL_TABLET | Freq: Two times a day (BID) | ORAL | Status: DC

## 2015-07-24 NOTE — Patient Instructions (Addendum)
The small bump next to your eye appears to be a benign cyst.  If any increase in size, pain, discharge, change in color, or other changes - return to discuss further. You can also have your eye care provider look at this at your next visit.   As you have been off your medicines recently, the diabetes and cholesterol tests would not be reliable readings. Recheck in next 2-3 months for these tests. Continue same medications for now.   Return to the clinic or go to the nearest emergency room if any of your symptoms worsen or new symptoms occur.  You should receive a call or letter about your lab results within the next week to 10 days.   Keeping you healthy  Get these tests  Blood pressure- Have your blood pressure checked once a year by your healthcare provider.  Normal blood pressure is 120/80.  Weight- Have your body mass index (BMI) calculated to screen for obesity.  BMI is a measure of body fat based on height and weight. You can also calculate your own BMI at [employer].  Cholesterol- Have your cholesterol checked regularly starting at age 53, sooner may be necessary if you have diabetes, high blood pressure, if a family member developed heart diseases at an early age or if you smoke.   Chlamydia, HIV, and other sexual transmitted disease- Get screened each year until the age of 11 then within three months of each new sexual partner.  Diabetes- Have your blood sugar checked regularly if you have high blood pressure, high cholesterol, a family history of diabetes or if you are overweight.  Get these vaccines  Flu shot- Every fall.  Tetanus shot- Every 10 years.  Menactra- Single dose; prevents meningitis.  Take these steps  Don't smoke- If you do smoke, ask your healthcare provider about quitting. For tips on how to quit, go to www.smokefree.gov or call 1-800-QUIT-NOW.  Be physically active- Exercise 5 days a week for at least 30 minutes.  If you are not already  physically active start slow and gradually work up to 30 minutes of moderate physical activity.  Examples of moderate activity include walking briskly, mowing the yard, dancing, swimming bicycling, etc.  Eat a healthy diet- Eat a variety of healthy foods such as fruits, vegetables, low fat milk, low fat cheese, yogurt, lean meats, poultry, fish, beans, tofu, etc.  For more information on healthy eating, go to www.thenutritionsource.org  Drink alcohol in moderation- Limit alcohol intake two drinks or less a day.  Never drink and drive.  Dentist- Brush and floss teeth twice daily; visit your dentis twice a year.  Depression-Your emotional health is as important as your physical health.  If you're feeling down, losing interest in things you normally enjoy please talk with your healthcare provider.  Gun Safety- If you keep a gun in your home, keep it unloaded and with the safety lock on.  Bullets should be stored separately.  Helmet use- Always wear a helmet when riding a motorcycle, bicycle, rollerblading or skateboarding.  Safe sex- If you may be exposed to a sexually transmitted infection, use a condom  Seat belts- Seat bels can save your life; always wear one.  Smoke/Carbon Monoxide detectors- These detectors need to be installed on the appropriate level of your home.  Replace batteries at least once a year.  Skin Cancer- When out in the sun, cover up and use sunscreen SPF 15 or higher.  Violence- If anyone is threatening or hurting you, please  tell your healthcare provider.   Because you received labwork today, you will receive an invoice from [employer]. Please contact [facility] at [phone] with questions or concerns regarding your invoice. Our billing staff will not be able to assist you with those questions.  You will be contacted with the lab results as soon as they are available. The fastest way to get your results is to activate your My Chart account.  Instructions are located on the last page of this paperwork. If you have not heard from [place] regarding the results in 2 weeks, please contact this office.

## 2015-07-24 NOTE — Progress Notes (Signed)
   Subjective:    Patient ID: Marcus Hart, male    DOB: 02-Jun-1965, 50 y.o.   MRN: [id number]  HPI    Review of Systems  Constitutional: Negative.   HENT: Negative.   Eyes: Negative.   Respiratory: Negative.   Cardiovascular: Negative.   Gastrointestinal: Negative.   Endocrine: Negative.   Genitourinary: Negative.   Musculoskeletal: Negative.   Skin: Negative.   Allergic/Immunologic: Negative.   Neurological: Negative.   Hematological: Negative.   Psychiatric/Behavioral: Negative.        Objective:   Physical Exam        Assessment & Plan:

## 2015-07-24 NOTE — Progress Notes (Addendum)
Subjective:    Patient ID: Marcus Hart, male    DOB: 05/06/1966, 50 y.o.   MRN: [id number] By signing my name below, I, [name], attest that this documentation has been prepared under the direction and in the presence of [name], MD.  Electronically Signed: [name], Medical Scribe. 07/24/2015. 11:02 AM.  HPI HPI Comments: Marcus Hart is a 50 y.o. male with a history of DM and hyperlipidemia who presents to the [facility] for a complete physical exam. His son is doing fine and is still undergoing treatments. Work has been fine for him.  Cancer screening: FMHx includes bladder cancer in his mother (non-smoker). No screenings were recommended for him. No FMHx of prostate cancer or colon cancer. He agrees to have prostate cancer screening.  Immunizations:  Immunization History  Administered Date(s) Administered  . Influenza,inj,Quad PF,36+ Mos 02/13/2013, 05/07/2014, 04/06/2015  . Tdap 02/13/2013    Depression screening: Patient denies feeling down or depressed. Depression screen [facility] 2/9 07/24/2015 04/17/2015 04/06/2015 05/07/2014 10/02/2013  Decreased Interest 0 0 0 0 0  Down, Depressed, Hopeless 0 0 0 0 0  PHQ - 2 Score 0 0 0 0 0   Vision screening: Last optho visit was within the past 2 months.  Visual Acuity Screening   Right eye Left eye Both eyes  Without correction:     With correction: 20/13 20/15 20/13     Dentist: He last saw his dentist in the middle of last year.  Diabetes: On metformin 1000 mg BID, Onglyza 5 mg qd, and lisinopril 2.5 mg qd. At that time, had been off of medications for 2 weeks, so continued same dose of medications. Patient states his blood sugars have been fine at home (120s-130s fasting when on medication). Patient notes he did not have insurance coverage last month so he did not take any of his medications except for metformin. He has insurance coverage again so he should not have any difficulty obtaining his  medications now. Lab Results  Component Value Date   HGBA1C 8.7 04/06/2015    Hyperlipidemia: Incomplete control with pravastatin, so was changed to Crestor 10 mg qd. Controlled. He has been off of Crestor for the past month.  Lab Results  Component Value Date   CHOL 184 04/06/2015   HDL 76 04/06/2015   LDLCALC 94 04/06/2015   TRIG 71 04/06/2015   CHOLHDL 2.4 04/06/2015    Lab Results  Component Value Date   ALT 12 04/06/2015   AST 14 04/06/2015   ALKPHOS 54 04/06/2015   BILITOT 0.5 04/06/2015    Exercise: He is not exercising regularly, but plans to start this month.  HIV testing: He has not been tested for HIV before. He agrees to be tested today. Patient denies extramarital sexual activity.  Bump on face: Patient also complains of a bump just outside his right eye on the face that has been there since approximately 1998. No changes since that time. No pain or discharge. No color change.   Patient Active Problem List   Diagnosis Date Noted  . DM2 (diabetes mellitus, type 2) ([place]) 02/13/2013  . Other and unspecified hyperlipidemia 02/13/2013   Past Medical History  Diagnosis Date  . Diabetes mellitus ([place])   . Hyperlipidemia    History reviewed. No pertinent past surgical history. No Known Allergies Prior to Admission medications   Medication Sig Start Date End Date Taking? Authorizing Provider  aspirin 81 MG tablet Take 81 mg by mouth daily.  Yes Historical Provider, MD  lisinopril (PRINIVIL,ZESTRIL) 2.5 MG tablet TAKE 1 TABLET BY MOUTH EVERY DAY 07/19/15  Yes [name], MD  metFORMIN (GLUCOPHAGE) 1000 MG tablet TAKE 1 TABLET BY MOUTH TWICE DAILY WITH MEALS 06/11/15  Yes [name], MD  ONGLYZA 5 MG TABS tablet TAKE 1 TABLET BY MOUTH EVERY DAY 07/19/15  Yes [name], MD  rosuvastatin (CRESTOR) 10 MG tablet TAKE 1 TABLET BY MOUTH EVERY DAY 07/19/15  Yes [name], MD   Social History   Social History  . Marital Status: Married    Spouse Name:  N/A  . Number of Children: N/A  . Years of Education: N/A   Occupational History  . Operator    Social History Main Topics  . Smoking status: Never Smoker   . Smokeless tobacco: Not on file  . Alcohol Use: 0.0 oz/week    0 Standard drinks or equivalent per week  . Drug Use: No  . Sexual Activity: Yes   Other Topics Concern  . Not on file   Social History Narrative   Married   Education: College   Exercise: No        Review of Systems 13 point ROS reviewed on patient health survey. Negative other than listed above or in nursing note. See nursing note.     Objective:   Physical Exam  Constitutional: He is oriented to person, place, and time. He appears well-developed and well-nourished.  HENT:  Head: Normocephalic and atraumatic.  Right Ear: External ear normal.  Left Ear: External ear normal.  Mouth/Throat: Oropharynx is clear and moist.  Eyes: Conjunctivae and EOM are normal. Pupils are equal, round, and reactive to light.  Neck: Normal range of motion. Neck supple. No thyromegaly present.  Cardiovascular: Normal rate, regular rhythm, normal heart sounds and intact distal pulses.   Pulmonary/Chest: Effort normal and breath sounds normal. No respiratory distress. He has no wheezes.  Abdominal: Soft. He exhibits no distension. There is no tenderness. Hernia confirmed negative in the right inguinal area and confirmed negative in the left inguinal area.  Genitourinary: Prostate normal.  Musculoskeletal: Normal range of motion. He exhibits no edema or tenderness.  Lymphadenopathy:    He has no cervical adenopathy.  Neurological: He is alert and oriented to person, place, and time. He has normal reflexes.  Skin: Skin is warm and dry.  Small elevated cystic appearing structure approximately 3-4 mm lateral to the right lateral canthus. Mobile, non-tender.  Psychiatric: He has a normal mood and affect. His behavior is normal.  Vitals reviewed.   Filed Vitals:   07/24/15  1037  BP: 120/80  Pulse: 56  Temp: 98 F (36.7 C)  TempSrc: Oral  Resp: 16  Height: 5\' 11"  (1.803 m)  Weight: 169 lb 6.4 oz (76.839 kg)  SpO2: 99%    Results for orders placed or performed in visit on 07/24/15  POCT urinalysis dipstick  Result Value Ref Range   Color, UA yellow yellow   Clarity, UA clear clear   Glucose, UA negative negative   Bilirubin, UA negative negative   Ketones, POC UA negative negative   Spec Grav, UA 1.010    Blood, UA negative negative   pH, UA 7.0    Protein Ur, POC negative negative   Urobilinogen, UA 0.2    Nitrite, UA Negative Negative   Leukocytes, UA Negative Negative        Assessment & Plan:  { Emmons Fester is a 49  y.o. male Annual physical exam  - -anticipatory guidance as below in AVS, screening labs above. Health maintenance items as above in HPI discussed/recommended as applicable.   Uncontrolled type 2 diabetes mellitus without complication, without long-term current use of insulin (HCC) - Plan: HM Diabetes Foot Exam, lisinopril (PRINIVIL,ZESTRIL) 2.5 MG tablet, metFORMIN (GLUCOPHAGE) 1000 MG tablet, saxagliptin HCl (ONGLYZA) 5 MG TABS tablet, POCT urinalysis dipstick  - recently off meds - testing deferred. Refilled meds at previous doses and recheck in next 2-3 months.   Screening for prostate cancer - Plan: PSA We discussed pros and cons of prostate cancer screening, and after this discussion, he chose to have screening done. PSA obtained, and no concerning findings on DRE.   Family history of bladder cancer - Plan: POCT urinalysis dipstick  - no hematuria, asymptomatic.   Hyperlipidemia - Plan: rosuvastatin (CRESTOR) 10 MG tablet  -continue crestor. Check labs next visit as not on meds recently.   Screening for HIV (human immunodeficiency virus) - Plan: HIV antibody   Meds ordered this encounter  Medications  . lisinopril (PRINIVIL,ZESTRIL) 2.5 MG tablet    Sig: Take 1 tablet (2.5 mg total) by mouth daily.     Dispense:  90 tablet    Refill:  0  . metFORMIN (GLUCOPHAGE) 1000 MG tablet    Sig: Take 1 tablet (1,000 mg total) by mouth 2 (two) times daily with a meal.    Dispense:  180 tablet    Refill:  0  . saxagliptin HCl (ONGLYZA) 5 MG TABS tablet    Sig: Take 1 tablet (5 mg total) by mouth daily.    Dispense:  90 tablet    Refill:  0  . rosuvastatin (CRESTOR) 10 MG tablet    Sig: Take 1 tablet (10 mg total) by mouth daily.    Dispense:  90 tablet    Refill:  0   Patient Instructions  The small bump next to your eye appears to be a benign cyst.  If any increase in size, pain, discharge, change in color, or other changes - return to discuss further. You can also have your eye care provider look at this at your next visit.   As you have been off your medicines recently, the diabetes and cholesterol tests would not be reliable readings. Recheck in next 2-3 months for these tests. Continue same medications for now.   Return to the clinic or go to the nearest emergency room if any of your symptoms worsen or new symptoms occur.  You should receive a call or letter about your lab results within the next week to 10 days.   Keeping you healthy  Get these tests  Blood pressure- Have your blood pressure checked once a year by your healthcare provider.  Normal blood pressure is 120/80.  Weight- Have your body mass index (BMI) calculated to screen for obesity.  BMI is a measure of body fat based on height and weight. You can also calculate your own BMI at [employer].it.  Cholesterol- Have your cholesterol checked regularly starting at age 80, sooner may be necessary if you have diabetes, high blood pressure, if a family member developed heart diseases at an early age or if you smoke.   Chlamydia, HIV, and other sexual transmitted disease- Get screened each year until the age of 27 then within three months of each new sexual partner.  Diabetes- Have your blood sugar checked regularly if  you have high blood pressure, high cholesterol, a family history of  diabetes or if you are overweight.  Get these vaccines  Flu shot- Every fall.  Tetanus shot- Every 10 years.  Menactra- Single dose; prevents meningitis.  Take these steps  Don't smoke- If you do smoke, ask your healthcare provider about quitting. For tips on how to quit, go to www.smokefree.gov or call 1-800-QUIT-NOW.  Be physically active- Exercise 5 days a week for at least 30 minutes.  If you are not already physically active start slow and gradually work up to 30 minutes of moderate physical activity.  Examples of moderate activity include walking briskly, mowing the yard, dancing, swimming bicycling, etc.  Eat a healthy diet- Eat a variety of healthy foods such as fruits, vegetables, low fat milk, low fat cheese, yogurt, lean meats, poultry, fish, beans, tofu, etc.  For more information on healthy eating, go to www.thenutritionsource.org  Drink alcohol in moderation- Limit alcohol intake two drinks or less a day.  Never drink and drive.  Dentist- Brush and floss teeth twice daily; visit your dentis twice a year.  Depression-Your emotional health is as important as your physical health.  If you're feeling down, losing interest in things you normally enjoy please talk with your healthcare provider.  Gun Safety- If you keep a gun in your home, keep it unloaded and with the safety lock on.  Bullets should be stored separately.  Helmet use- Always wear a helmet when riding a motorcycle, bicycle, rollerblading or skateboarding.  Safe sex- If you may be exposed to a sexually transmitted infection, use a condom  Seat belts- Seat bels can save your life; always wear one.  Smoke/Carbon Monoxide detectors- These detectors need to be installed on the appropriate level of your home.  Replace batteries at least once a year.  Skin Cancer- When out in the sun, cover up and use sunscreen SPF 15 or higher.  Violence- If  anyone is threatening or hurting you, please tell your healthcare provider.   Because you received labwork today, you will receive an invoice from [employer]. Please contact [facility] at [phone] with questions or concerns regarding your invoice. Our billing staff will not be able to assist you with those questions.  You will be contacted with the lab results as soon as they are available. The fastest way to get your results is to activate your My Chart account. Instructions are located on the last page of this paperwork. If you have not heard from [place] regarding the results in 2 weeks, please contact this office.    I personally performed the services described in this documentation, which was scribed in my presence. The recorded information has been reviewed and considered, and addended by me as needed.

## 2015-07-25 LAB — PSA: PSA: 0.33 ng/mL (ref <=4.00)

## 2015-08-02 NOTE — Addendum Note (Signed)
Addended by: Wyatt Haste on: 08/02/2015 07:37 AM   Modules accepted: Miquel Dunn

## 2015-10-23 ENCOUNTER — Ambulatory Visit: Payer: BLUE CROSS/BLUE SHIELD | Admitting: Family Medicine

## 2015-10-24 ENCOUNTER — Encounter: Payer: BLUE CROSS/BLUE SHIELD | Admitting: Family Medicine

## 2015-10-31 ENCOUNTER — Encounter: Payer: Self-pay | Admitting: Family Medicine

## 2015-10-31 ENCOUNTER — Ambulatory Visit (INDEPENDENT_AMBULATORY_CARE_PROVIDER_SITE_OTHER): Payer: BLUE CROSS/BLUE SHIELD | Admitting: Family Medicine

## 2015-10-31 VITALS — BP 120/78 | HR 60 | Temp 98.2°F | Resp 16 | Ht 71.0 in | Wt 169.2 lb

## 2015-10-31 DIAGNOSIS — E785 Hyperlipidemia, unspecified: Secondary | ICD-10-CM | POA: Diagnosis not present

## 2015-10-31 DIAGNOSIS — E119 Type 2 diabetes mellitus without complications: Secondary | ICD-10-CM | POA: Diagnosis not present

## 2015-10-31 LAB — GLUCOSE, POCT (MANUAL RESULT ENTRY): POC Glucose: 155 mg/dl — AB (ref 70–99)

## 2015-10-31 LAB — POCT GLYCOSYLATED HEMOGLOBIN (HGB A1C): Hemoglobin A1C: 6.9

## 2015-10-31 MED ORDER — SAXAGLIPTIN HCL 5 MG PO TABS
5.0000 mg | ORAL_TABLET | Freq: Every day | ORAL | Status: DC
Start: 2015-10-31 — End: 2017-05-28

## 2015-10-31 MED ORDER — LISINOPRIL 2.5 MG PO TABS: 2.5000 mg | ORAL_TABLET | Freq: Every day | ORAL | Status: DC

## 2015-10-31 MED ORDER — METFORMIN HCL 1000 MG PO TABS: 1000.0000 mg | ORAL_TABLET | Freq: Two times a day (BID) | ORAL | Status: DC

## 2015-10-31 MED ORDER — ROSUVASTATIN CALCIUM 10 MG PO TABS: 10.0000 mg | ORAL_TABLET | Freq: Every day | ORAL | Status: DC

## 2015-10-31 NOTE — Progress Notes (Addendum)
By signing my name below, I, Mesha Guinyard, attest that this documentation has been prepared under the direction and in the presence of [name], MD.  Electronically Signed: Verlee Monte, Medical Scribe. 10/31/2015. 8:49 AM.  Subjective:    Patient ID: Marcus Hart, male    DOB: Jan 19, 1966, 50 y.o.   MRN: [id number]  HPI Chief Complaint  Patient presents with  . Follow-up    DIABETES  . Medication Refill    ALL RXs with     HPI Comments: Marcus Hart is a 50 y.o. male who presents to the [facility] for a follow-up. Pt is here for DM and HLD. Pt states his blood pressure at home is always 120/low 80s  DM: Lab Results  Component Value Date   HGBA1C 8.7 04/06/2015   Lab Results  Component Value Date   MICROALBUR <0.2 04/06/2015   Had been off of medications temporarily when seen in March. We deferred testing at last visit as this may be unreliable, and restarted his Metformin and on Onglyza. Pt still takes his medications and has been checking his blood sugar levels. Pt denies experiencing side affects from his medications. Pt's blood sugar was 136 the last time he checked. Pt ate bread 6 hours ago.  Exercise: Pt denies exercising.  HLD: Lab Results  Component Value Date   CHOL 184 04/06/2015   HDL 76 04/06/2015   LDLCALC 94 04/06/2015   TRIG 71 04/06/2015   CHOLHDL 2.4 04/06/2015   Lab Results  Component Value Date   ALT 12 04/06/2015   AST 14 04/06/2015   ALKPHOS 54 04/06/2015   BILITOT 0.5 04/06/2015   Pt still takes Crestor and hasn't experienced any side affects of this medication.  Patient Active Problem List   Diagnosis Date Noted  . DM2 (diabetes mellitus, type 2) ([place]) 02/13/2013  . Other and unspecified hyperlipidemia 02/13/2013   Past Medical History  Diagnosis Date  . Diabetes mellitus ([place])   . Hyperlipidemia    No past surgical history on file. No Known Allergies Prior to Admission medications   Medication Sig  Start Date End Date Taking? Authorizing Provider  aspirin 81 MG tablet Take 81 mg by mouth daily.   Yes Historical Provider, MD  lisinopril (PRINIVIL,ZESTRIL) 2.5 MG tablet Take 1 tablet (2.5 mg total) by mouth daily. 07/24/15  Yes [name], MD  metFORMIN (GLUCOPHAGE) 1000 MG tablet Take 1 tablet (1,000 mg total) by mouth 2 (two) times daily with a meal. 07/24/15  Yes [name], MD  rosuvastatin (CRESTOR) 10 MG tablet Take 1 tablet (10 mg total) by mouth daily. 07/24/15  Yes [name], MD  saxagliptin HCl (ONGLYZA) 5 MG TABS tablet Take 1 tablet (5 mg total) by mouth daily. 07/24/15  Yes [name], MD   Social History   Social History  . Marital Status: Married    Spouse Name: N/A  . Number of Children: N/A  . Years of Education: N/A   Occupational History  . Operator    Social History Main Topics  . Smoking status: Never Smoker   . Smokeless tobacco: Not on file  . Alcohol Use: 0.0 oz/week    0 Standard drinks or equivalent per week  . Drug Use: No  . Sexual Activity: Yes   Other Topics Concern  . Not on file   Social History Narrative   Married   Education: College   Exercise: No  Review of Systems  Constitutional: Negative for diaphoresis, fatigue and unexpected weight change.  Eyes: Negative for visual disturbance.  Respiratory: Negative for cough, chest tightness and shortness of breath.   Cardiovascular: Negative for chest pain, palpitations and leg swelling.  Gastrointestinal: Negative for abdominal pain and blood in stool.  Neurological: Negative for dizziness, light-headedness and headaches.    Objective:   Physical Exam  Constitutional: He is oriented to person, place, and time. He appears well-developed and well-nourished.  HENT:  Head: Normocephalic and atraumatic.  Eyes: EOM are normal. Pupils are equal, round, and reactive to light.  Neck: No JVD present. Carotid bruit is not present.  Cardiovascular: Normal rate, regular  rhythm and normal heart sounds.   No murmur heard. Pulmonary/Chest: Effort normal and breath sounds normal. He has no rales.  Musculoskeletal: He exhibits no edema.  Neurological: He is alert and oriented to person, place, and time.  Skin: Skin is warm and dry.  Psychiatric: He has a normal mood and affect.  Vitals reviewed.  BP 120/78 mmHg  Pulse 60  Temp(Src) 98.2 F (36.8 C) (Oral)  Resp 16  Ht 5\' 11"  (1.803 m)  Wt 169 lb 3.2 oz (76.749 kg)  BMI 23.61 kg/m2  SpO2 99%  Manual BP check: 120/78   Results for orders placed or performed in visit on 10/31/15  POCT glycosylated hemoglobin (Hb A1C)  Result Value Ref Range   Hemoglobin A1C 6.9   POCT glucose (manual entry)  Result Value Ref Range   POC Glucose 155 (A) 70 - 99 mg/dl   Assessment & Plan:   Marcus Hart is a 50 y.o. male Controlled type 2 diabetes mellitus without complication, without long-term current use of insulin ([place]) - Plan: POCT glycosylated hemoglobin (Hb A1C), POCT glucose (manual entry), lisinopril (PRINIVIL,ZESTRIL) 2.5 MG tablet, metFORMIN (GLUCOPHAGE) 1000 MG tablet, saxagliptin HCl (ONGLYZA) 5 MG TABS tablet  - Improved control. No change in medications for now as tolerating without difficulty. Recheck in 3-6 months. 6 months of medication prescribed.  Hyperlipidemia - Plan: COMPLETE METABOLIC PANEL WITH GFR, Lipid panel, rosuvastatin (CRESTOR) 10 MG tablet  -Tolerating Crestor. No new side effects, continue same doses, labs pending.   Meds ordered this encounter  Medications  . lisinopril (PRINIVIL,ZESTRIL) 2.5 MG tablet    Sig: Take 1 tablet (2.5 mg total) by mouth daily.    Dispense:  90 tablet    Refill:  1  . metFORMIN (GLUCOPHAGE) 1000 MG tablet    Sig: Take 1 tablet (1,000 mg total) by mouth 2 (two) times daily with a meal.    Dispense:  180 tablet    Refill:  1  . rosuvastatin (CRESTOR) 10 MG tablet    Sig: Take 1 tablet (10 mg total) by mouth daily.    Dispense:  90 tablet     Refill:  1  . saxagliptin HCl (ONGLYZA) 5 MG TABS tablet    Sig: Take 1 tablet (5 mg total) by mouth daily.    Dispense:  90 tablet    Refill:  1   Patient Instructions       IF you received an x-ray today, you will receive an invoice from [facility]. Please contact [facility] at [phone] with questions or concerns regarding your invoice.   IF you received labwork today, you will receive an invoice from [employer]. Please contact [facility] at [phone] with questions or concerns regarding your invoice.   Our billing staff will not be able to  assist you with questions regarding bills from these companies.  You will be contacted with the lab results as soon as they are available. The fastest way to get your results is to activate your My Chart account. Instructions are located on the last page of this paperwork. If you have not heard from [place] regarding the results in 2 weeks, please contact this office.    Diabetes is controlled. Good job. Continue to watch diet, walking or other form of exercise most days a week, no change in medicines for now. Follow-up in the next 3-6 months. Sooner if any new or worsening symptoms.      I personally performed the services described in this documentation, which was scribed in my presence. The recorded information has been reviewed and considered, and addended by me as needed.   Signed,   [name], MD [facility].  10/31/2015 10:49 AM

## 2015-10-31 NOTE — Patient Instructions (Addendum)
     IF you received an x-ray today, you will receive an invoice from [facility]. Please contact [facility] at [phone] with questions or concerns regarding your invoice.   IF you received labwork today, you will receive an invoice from [employer]. Please contact [facility] at [phone] with questions or concerns regarding your invoice.   Our billing staff will not be able to assist you with questions regarding bills from these companies.  You will be contacted with the lab results as soon as they are available. The fastest way to get your results is to activate your My Chart account. Instructions are located on the last page of this paperwork. If you have not heard from [place] regarding the results in 2 weeks, please contact this office.    Diabetes is controlled. Good job. Continue to watch diet, walking or other form of exercise most days a week, no change in medicines for now. Follow-up in the next 3-6 months. Sooner if any new or worsening symptoms.

## 2015-11-01 LAB — COMPLETE METABOLIC PANEL WITH GFR
ALT: 9 U/L (ref 9–46)
AST: 14 U/L (ref 10–40)
Albumin: 4.3 g/dL (ref 3.6–5.1)
Alkaline Phosphatase: 45 U/L (ref 40–115)
BUN: 16 mg/dL (ref 7–25)
CALCIUM: 9.3 mg/dL (ref 8.6–10.3)
CHLORIDE: 103 mmol/L (ref 98–110)
CO2: 22 mmol/L (ref 20–31)
CREATININE: 0.78 mg/dL (ref 0.60–1.35)
GFR, Est African American: >89 mL/min (ref >=60)
GFR, Est Non African American: >89 mL/min (ref >=60)
Glucose, Bld: 145 mg/dL — ABNORMAL HIGH (ref 65–99)
POTASSIUM: 4.2 mmol/L (ref 3.5–5.3)
Sodium: 140 mmol/L (ref 135–146)
Total Bilirubin: 0.4 mg/dL (ref 0.2–1.2)
Total Protein: 6.7 g/dL (ref 6.1–8.1)

## 2015-11-01 LAB — LIPID PANEL
CHOL/HDL RATIO: 2.1 ratio (ref <=5.0)
CHOLESTEROL: 147 mg/dL (ref 125–200)
HDL: 70 mg/dL (ref >=40)
LDL Cholesterol: 64 mg/dL (ref <130)
TRIGLYCERIDES: 65 mg/dL (ref <150)
VLDL: 13 mg/dL (ref <30)

## 2015-11-22 ENCOUNTER — Ambulatory Visit (INDEPENDENT_AMBULATORY_CARE_PROVIDER_SITE_OTHER): Payer: BLUE CROSS/BLUE SHIELD | Admitting: Urgent Care

## 2015-11-22 ENCOUNTER — Ambulatory Visit (INDEPENDENT_AMBULATORY_CARE_PROVIDER_SITE_OTHER): Payer: BLUE CROSS/BLUE SHIELD

## 2015-11-22 VITALS — BP 96/62 | HR 66 | Temp 97.9°F | Resp 18 | Ht 71.0 in | Wt 166.6 lb

## 2015-11-22 DIAGNOSIS — R61 Generalized hyperhidrosis: Secondary | ICD-10-CM

## 2015-11-22 DIAGNOSIS — R059 Cough, unspecified: Secondary | ICD-10-CM

## 2015-11-22 DIAGNOSIS — R05 Cough: Secondary | ICD-10-CM

## 2015-11-22 DIAGNOSIS — R509 Fever, unspecified: Secondary | ICD-10-CM | POA: Diagnosis not present

## 2015-11-22 LAB — POCT CBC
Granulocyte percent: 58.5 %G (ref 37–80)
HCT, POC: 39.8 % — AB (ref 43.5–53.7)
HEMOGLOBIN: 14.1 g/dL (ref 14.1–18.1)
Lymph, poc: 1.4 (ref 0.6–3.4)
MCH: 28.4 pg (ref 27–31.2)
MCHC: 35.3 g/dL (ref 31.8–35.4)
MCV: 80.4 fL (ref 80–97)
MID (CBC): 0.2 (ref 0–0.9)
MPV: 7.9 fL (ref 0–99.8)
PLATELET COUNT, POC: 191 10*3/uL (ref 142–424)
POC Granulocyte: 2.2 (ref 2–6.9)
POC LYMPH PERCENT: 36.2 %L (ref 10–50)
POC MID %: 5.3 %M (ref 0–12)
RBC: 4.95 M/uL (ref 4.69–6.13)
RDW, POC: 12.6 %
WBC: 3.8 10*3/uL — AB (ref 4.6–10.2)

## 2015-11-22 MED ORDER — AZITHROMYCIN 250 MG PO TABS: ORAL_TABLET | ORAL | Status: DC

## 2015-11-22 MED ORDER — HYDROCODONE-HOMATROPINE 5-1.5 MG/5ML PO SYRP: 5.0000 mL | ORAL_SOLUTION | Freq: Every evening | ORAL | Status: DC | PRN

## 2015-11-22 NOTE — Progress Notes (Signed)
MRN: [id number] DOB: Jul 14, 1965  Subjective:   Marcus Hart is a 50 y.o. male presenting for chief complaint of Cough  Reports 1 month history of productive cough, night time fever (highest was 102F), night sweats, fatigue, mild wheezing. Has been using APAP and ibuprofen with relief of fever. Denies chest pain, sore throat. Denies history of asthma, allergies. Denies smoking cigarettes. Of note, patient was in [place] and [place] last year. Has a 84 year old son, wears a mask around him. Patient works in a [employer], technical sales.  Thaxton has a current medication list which includes the following prescription(s): aspirin, lisinopril, metformin, rosuvastatin, and saxagliptin hcl. Also has No Known Allergies.  Abeer  has a past medical history of Diabetes mellitus ([place]) and Hyperlipidemia. Also  has no past surgical history on file.  Objective:   Vitals: BP 96/62 mmHg  Pulse 66  Temp(Src) 97.9 F (36.6 C) (Oral)  Resp 18  Ht 5\' 11"  (1.803 m)  Wt 166 lb 9.6 oz (75.569 kg)  BMI 23.25 kg/m2  SpO2 99%  Wt Readings from Last 3 Encounters:  11/22/15 166 lb 9.6 oz (75.569 kg)  10/31/15 169 lb 3.2 oz (76.749 kg)  07/24/15 169 lb 6.4 oz (76.839 kg)   Physical Exam  Constitutional: He is oriented to person, place, and time. He appears well-developed and well-nourished.  HENT:  TM's intact bilaterally, no effusions or erythema. Nasal turbinates pink and moist, nasal passages patent. No sinus tenderness. Oropharynx clear, mucous membranes moist, dentition in good repair.  Eyes: No scleral icterus.  Neck: Normal range of motion. Neck supple.  Cardiovascular: Normal rate, regular rhythm and intact distal pulses.  Exam reveals no gallop and no friction rub.   No murmur heard. Pulmonary/Chest: No respiratory distress. He has no wheezes. He has no rales.  Decreased lung sounds which may represent poor respiratory effort.  Lymphadenopathy:    He has no cervical adenopathy.    Neurological: He is alert and oriented to person, place, and time.  Skin: Skin is warm and dry.   Dg Chest 2 View  11/22/2015  CLINICAL DATA:  Cough. EXAM: CHEST  2 VIEW COMPARISON:  07/22/2012. FINDINGS: Mediastinum hilar structures are unremarkable. Lungs are clear. Heart size unremarkable. No acute bony abnormality. IMPRESSION: No active cardiopulmonary disease. Electronically Signed   By: Marcello Moores  Register   On: 11/22/2015 09:41     Results for orders placed or performed in visit on 11/22/15 (from the past 24 hour(s))  POCT CBC     Status: Abnormal   Collection Time: 11/22/15  9:34 AM  Result Value Ref Range   WBC 3.8 (A) 4.6 - 10.2 K/uL   Lymph, poc 1.4 0.6 - 3.4   POC LYMPH PERCENT 36.2 10 - 50 %L   MID (cbc) 0.2 0 - 0.9   POC MID % 5.3 0 - 12 %M   POC Granulocyte 2.2 2 - 6.9   Granulocyte percent 58.5 37 - 80 %G   RBC 4.95 4.69 - 6.13 M/uL   Hemoglobin 14.1 14.1 - 18.1 g/dL   HCT, POC 39.8 (A) 43.5 - 53.7 %   MCV 80.4 80 - 97 fL   MCH, POC 28.4 27 - 31.2 pg   MCHC 35.3 31.8 - 35.4 g/dL   RDW, POC 12.6 %   Platelet Count, POC 191 142 - 424 K/uL   MPV 7.9 0 - 99.8 fL    Assessment and Plan :   1. Cough 2. Night  sweats 3. Fever, unspecified - Labs pending, will cover for infectious process with Azithromycin. Consider stopping lisinopril if there is no improvement in 1 week.  Jaynee Eagles, PA-C [facility] [phone] 11/22/2015 9:10 AM

## 2015-11-22 NOTE — Patient Instructions (Addendum)
Cough, Adult Coughing is a reflex that clears your throat and your airways. Coughing helps to heal and protect your lungs. It is normal to cough occasionally, but a cough that happens with other symptoms or lasts a long time may be a sign of a condition that needs treatment. A cough may last only 2-3 weeks (acute), or it may last longer than 8 weeks (chronic). CAUSES Coughing is commonly caused by:  Breathing in substances that irritate your lungs.  A viral or bacterial respiratory infection.  Allergies.  Asthma.  Postnasal drip.  Smoking.  Acid backing up from the stomach into the esophagus (gastroesophageal reflux).  Certain medicines.  Chronic lung problems, including COPD (or rarely, lung cancer).  Other medical conditions such as heart failure. HOME CARE INSTRUCTIONS  Pay attention to any changes in your symptoms. Take these actions to help with your discomfort:  Take medicines only as told by your health care provider.  If you were prescribed an antibiotic medicine, take it as told by your health care provider. Do not stop taking the antibiotic even if you start to feel better.  Talk with your health care provider before you take a cough suppressant medicine.  Drink enough fluid to keep your urine clear or pale yellow.  If the air is dry, use a cold steam vaporizer or humidifier in your bedroom or your home to help loosen secretions.  Avoid anything that causes you to cough at work or at home.  If your cough is worse at night, try sleeping in a semi-upright position.  Avoid cigarette smoke. If you smoke, quit smoking. If you need help quitting, ask your health care provider.  Avoid caffeine.  Avoid alcohol.  Rest as needed. SEEK MEDICAL CARE IF:   You have new symptoms.  You cough up pus.  Your cough does not get better after 2-3 weeks, or your cough gets worse.  You cannot control your cough with suppressant medicines and you are losing sleep.  You  develop pain that is getting worse or pain that is not controlled with pain medicines.  You have a fever.  You have unexplained weight loss.  You have night sweats. SEEK IMMEDIATE MEDICAL CARE IF:  You cough up blood.  You have difficulty breathing.  Your heartbeat is very fast.   This information is not intended to replace advice given to you by your health care provider. Make sure you discuss any questions you have with your health care provider.   Document Released: 10/31/2010 Document Revised: 01/23/2015 Document Reviewed: 07/11/2014 Elsevier Interactive Patient Education 2016 Elsevier Inc.     IF you received an x-ray today, you will receive an invoice from [facility]. Please contact  Radiology at [phone] with questions or concerns regarding your invoice.   IF you received labwork today, you will receive an invoice from [facility]. Please contact [facility] at [phone] with questions or concerns regarding your invoice.   Our billing staff will not be able to assist you with questions regarding bills from these companies.  You will be contacted with the lab results as soon as they are available. The fastest way to get your results is to activate your My Chart account. Instructions are located on the last page of this paperwork. If you have not heard from us regarding the results in 2 weeks, please contact this office.      

## 2015-11-23 LAB — QUANTIFERON TB GOLD ASSAY (BLOOD)
INTERFERON GAMMA RELEASE ASSAY: NEGATIVE
QUANTIFERON NIL VALUE: 0.03 [IU]/mL
Quantiferon Tb Ag Minus Nil Value: 0 IU/mL

## 2016-02-06 ENCOUNTER — Ambulatory Visit: Payer: BLUE CROSS/BLUE SHIELD | Admitting: Family Medicine

## 2016-02-14 ENCOUNTER — Ambulatory Visit (INDEPENDENT_AMBULATORY_CARE_PROVIDER_SITE_OTHER): Payer: BLUE CROSS/BLUE SHIELD | Admitting: Physician Assistant

## 2016-02-14 VITALS — BP 134/82 | HR 98 | Temp 98.9°F | Resp 18 | Ht 71.0 in | Wt 170.0 lb

## 2016-02-14 DIAGNOSIS — J069 Acute upper respiratory infection, unspecified: Secondary | ICD-10-CM

## 2016-02-14 DIAGNOSIS — J029 Acute pharyngitis, unspecified: Secondary | ICD-10-CM

## 2016-02-14 LAB — POCT RAPID STREP A (OFFICE): RAPID STREP A SCREEN: NEGATIVE

## 2016-02-14 MED ORDER — HYDROCOD POLST-CPM POLST ER 10-8 MG/5ML PO SUER: 5.0000 mL | Freq: Every evening | ORAL | 0 refills | Status: DC | PRN

## 2016-02-14 MED ORDER — GUAIFENESIN ER 1200 MG PO TB12: 1.0000 | ORAL_TABLET | Freq: Two times a day (BID) | ORAL | 1 refills | Status: DC | PRN

## 2016-02-14 MED ORDER — AZITHROMYCIN 250 MG PO TABS: ORAL_TABLET | ORAL | 0 refills | Status: DC

## 2016-02-14 NOTE — Progress Notes (Signed)
[facility] [address] [place] [address] [phone]  By signing my name below, I, Marcus Hart, attest that this documentation has been prepared under the direction and in the presence of Marcus Marino, PA-C. Electronically Signed: Verlee Hart, Medical Scribe. 02/14/16. 4:03 PM.  Date:  02/14/2016   Name:  Marcus Hart   DOB:  [birth year]   MRN:  [id number]  PCP:  Marcus Agreste, MD   Chief Complaint  Patient presents with   Cough   Sore Throat    History of Present Illness:  Marcus Hart is a 50 y.o. male patient who presents to [facility] complaining of wet cough with green sputum onset 2 weeks ago. Pt reports associated symptoms of sore throat, fever of 103, and congestion. Pt states he is drinking a nl amount of water. Pt went to his home country, [place], 2 weeks ago and came back a few days ago. Pt denies dyspnea, and abdominal pain.  Patient Active Problem List   Diagnosis Date Noted   DM2 (diabetes mellitus, type 2) ([place]) 02/13/2013   Other and unspecified hyperlipidemia 02/13/2013    Past Medical History:  Diagnosis Date   Diabetes mellitus ([place])    Hyperlipidemia     History reviewed. No pertinent surgical history.  Social History  Substance Use Topics   Smoking status: Never Smoker   Smokeless tobacco: Never Used   Alcohol use 0.0 oz/week    Family History  Problem Relation Age of Onset   Diabetes Mother    Cancer Mother     bladder   Ulcers Father    Benign prostatic hyperplasia Father     No Known Allergies  Medication list has been reviewed and updated.  Current Outpatient Prescriptions on File Prior to Visit  Medication Sig Dispense Refill   aspirin 81 MG tablet Take 81 mg by mouth daily.     lisinopril (PRINIVIL,ZESTRIL) 2.5 MG tablet Take 1 tablet (2.5 mg total) by mouth daily. 90 tablet 1   metFORMIN (GLUCOPHAGE) 1000 MG tablet Take 1 tablet (1,000 mg total) by mouth 2 (two) times daily  with a meal. 180 tablet 1   rosuvastatin (CRESTOR) 10 MG tablet Take 1 tablet (10 mg total) by mouth daily. 90 tablet 1   saxagliptin HCl (ONGLYZA) 5 MG TABS tablet Take 1 tablet (5 mg total) by mouth daily. 90 tablet 1   No current facility-administered medications on file prior to visit.     Review of Systems  Constitutional: Positive for fever.  HENT: Positive for congestion and sore throat.   Respiratory: Positive for cough.        Negative dyspnea  Gastrointestinal: Negative for abdominal pain.   Physical Examination: BP 134/82 (BP Location: Right Arm, Patient Position: Sitting, Cuff Size: Small)    Pulse 98    Temp 98.9 F (37.2 C) (Oral)    Resp 18    Ht 5\' 11"  (1.803 m)    Wt 170 lb (77.1 kg)    SpO2 99%    BMI 23.71 kg/m  Ideal Body Weight: @FLOWAMB [id number]  Physical Exam  Constitutional: He is oriented to person, place, and time. He appears well-developed and well-nourished. No distress.  HENT:  Head: Normocephalic and atraumatic.  Right Ear: Tympanic membrane, external ear and ear canal normal.  Left Ear: Tympanic membrane, external ear and ear canal normal.  Nose: Mucosal edema and rhinorrhea present. Right sinus exhibits no maxillary sinus tenderness and no frontal sinus tenderness. Left sinus exhibits  no maxillary sinus tenderness and no frontal sinus tenderness.  Mouth/Throat: No uvula swelling. Oropharyngeal exudate and posterior oropharyngeal erythema present. No posterior oropharyngeal edema.  Eyes: Conjunctivae, EOM and lids are normal. Pupils are equal, round, and reactive to light. Right eye exhibits normal extraocular motion. Left eye exhibits normal extraocular motion.  Neck: Trachea normal and full passive range of motion without pain. No edema and no erythema present.  Cardiovascular: Normal rate, regular rhythm and normal heart sounds.  Exam reveals no friction rub.   No murmur heard. Pulmonary/Chest: Effort normal and breath sounds normal. No  respiratory distress. He has no decreased breath sounds. He has no wheezes. He has no rhonchi. He has no rales.  Neurological: He is alert and oriented to person, place, and time.  Skin: Skin is warm and dry. He is not diaphoretic.  Psychiatric: He has a normal mood and affect. His behavior is normal.   Results for orders placed or performed in visit on 02/14/16  POCT rapid strep A  Result Value Ref Range   Rapid Strep A Screen Negative Negative   Assessment and Plan: Marcus Hart is a 50 y.o. male who is here today for throat pain and cough. Due to longevity of his symptoms, I am filling a antibiotic for arterial etiology. Patient was given a Z-Pak today. Advised based next 1200 mg every 12 hours. He'll follow-up as needed.  Sore throat - Plan: POCT rapid strep A, Culture, Group A Strep  Acute upper respiratory infection - Plan: azithromycin (ZITHROMAX) 250 MG tablet, Guaifenesin (MUCINEX MAXIMUM STRENGTH) 1200 MG TB12  Marcus Drape, PA-C [facility] 9/30/201710:42 PM  I personally performed the services described in this documentation, which was scribed in my presence. The recorded information has been reviewed and is accurate.

## 2016-02-14 NOTE — Patient Instructions (Addendum)
Please hydrate better with 64 oz or more of water per day. Please use mucinex as instructed.   If your symptoms worsen please return.     IF you received an x-ray today, you will receive an invoice from [facility]. Please contact [facility] at [phone] with questions or concerns regarding your invoice.   IF you received labwork today, you will receive an invoice from [facility]. Please contact [facility] at [phone] with questions or concerns regarding your invoice.   Our billing staff will not be able to assist you with questions regarding bills from these companies.  You will be contacted with the lab results as soon as they are available. The fastest way to get your results is to activate your My Chart account. Instructions are located on the last page of this paperwork. If you have not heard from [place] regarding the results in 2 weeks, please contact this office.

## 2016-02-16 LAB — CULTURE, GROUP A STREP: Organism ID, Bacteria: NORMAL

## 2016-04-08 ENCOUNTER — Encounter: Payer: Self-pay | Admitting: Family Medicine

## 2016-04-08 LAB — HM DIABETES EYE EXAM

## 2016-05-09 ENCOUNTER — Other Ambulatory Visit: Payer: Self-pay | Admitting: Family Medicine

## 2016-05-09 DIAGNOSIS — E1165 Type 2 diabetes mellitus with hyperglycemia: Secondary | ICD-10-CM

## 2016-05-09 DIAGNOSIS — E119 Type 2 diabetes mellitus without complications: Secondary | ICD-10-CM

## 2016-05-09 DIAGNOSIS — IMO0001 Reserved for inherently not codable concepts without codable children: Secondary | ICD-10-CM

## 2017-02-26 ENCOUNTER — Telehealth: Payer: Self-pay | Admitting: Family Medicine

## 2017-02-26 NOTE — Telephone Encounter (Signed)
Patient would like to have Dr Carlota Raspberry call him about some medication issues he is having.  His call back number is [phone]

## 2017-03-04 NOTE — Telephone Encounter (Signed)
IC pt LMOVM for pt to return call to Clinical TL To discuss medication issues.

## 2017-05-28 ENCOUNTER — Ambulatory Visit: Payer: BLUE CROSS/BLUE SHIELD | Admitting: Physician Assistant

## 2017-05-28 ENCOUNTER — Encounter: Payer: Self-pay | Admitting: Physician Assistant

## 2017-05-28 ENCOUNTER — Other Ambulatory Visit: Payer: Self-pay

## 2017-05-28 VITALS — BP 120/70 | HR 65 | Temp 98.3°F | Resp 16 | Ht 70.75 in | Wt 172.0 lb

## 2017-05-28 DIAGNOSIS — E119 Type 2 diabetes mellitus without complications: Secondary | ICD-10-CM | POA: Diagnosis not present

## 2017-05-28 DIAGNOSIS — Z79899 Other long term (current) drug therapy: Secondary | ICD-10-CM | POA: Diagnosis not present

## 2017-05-28 LAB — POCT GLYCOSYLATED HEMOGLOBIN (HGB A1C): Hemoglobin A1C: 7.8

## 2017-05-28 MED ORDER — SAXAGLIPTIN HCL 5 MG PO TABS: 5.0000 mg | ORAL_TABLET | Freq: Every day | ORAL | 1 refills | Status: DC

## 2017-05-28 MED ORDER — METFORMIN HCL 1000 MG PO TABS
ORAL_TABLET | ORAL | 1 refills | Status: DC
Start: 2017-05-28 — End: 2017-09-16

## 2017-05-28 NOTE — Patient Instructions (Addendum)
Please see below for diabetes and nutrition information.  Come back and see Dr. Carlota Raspberry in 3 months.   Diabetes Mellitus and Nutrition When you have diabetes (diabetes mellitus), it is very important to have healthy eating habits because your blood sugar (glucose) levels are greatly affected by what you eat and drink. Eating healthy foods in the appropriate amounts, at about the same times every day, can help you:  Control your blood glucose.  Lower your risk of heart disease.  Improve your blood pressure.  Reach or maintain a healthy weight.  Every person with diabetes is different, and each person has different needs for a meal plan. Your health care provider may recommend that you work with a diet and nutrition specialist (dietitian) to make a meal plan that is best for you. Your meal plan may vary depending on factors such as:  The calories you need.  The medicines you take.  Your weight.  Your blood glucose, blood pressure, and cholesterol levels.  Your activity level.  Other health conditions you have, such as heart or kidney disease.  How do carbohydrates affect me? Carbohydrates affect your blood glucose level more than any other type of food. Eating carbohydrates naturally increases the amount of glucose in your blood. Carbohydrate counting is a method for keeping track of how many carbohydrates you eat. Counting carbohydrates is important to keep your blood glucose at a healthy level, especially if you use insulin or take certain oral diabetes medicines. It is important to know how many carbohydrates you can safely have in each meal. This is different for every person. Your dietitian can help you calculate how many carbohydrates you should have at each meal and for snack. Foods that contain carbohydrates include:  Bread, cereal, rice, pasta, and crackers.  Potatoes and corn.  Peas, beans, and lentils.  Milk and yogurt.  Fruit and juice.  Desserts, such as cakes,  cookies, ice cream, and candy.  How does alcohol affect me? Alcohol can cause a sudden decrease in blood glucose (hypoglycemia), especially if you use insulin or take certain oral diabetes medicines. Hypoglycemia can be a life-threatening condition. Symptoms of hypoglycemia (sleepiness, dizziness, and confusion) are similar to symptoms of having too much alcohol. If your health care provider says that alcohol is safe for you, follow these guidelines:  Limit alcohol intake to no more than 1 drink per day for nonpregnant women and 2 drinks per day for men. One drink equals 12 oz of beer, 5 oz of wine, or 1 oz of hard liquor.  Do not drink on an empty stomach.  Keep yourself hydrated with water, diet soda, or unsweetened iced tea.  Keep in mind that regular soda, juice, and other mixers may contain a lot of sugar and must be counted as carbohydrates.  What are tips for following this plan? Reading food labels  Start by checking the serving size on the label. The amount of calories, carbohydrates, fats, and other nutrients listed on the label are based on one serving of the food. Many foods contain more than one serving per package.  Check the total grams (g) of carbohydrates in one serving. You can calculate the number of servings of carbohydrates in one serving by dividing the total carbohydrates by 15. For example, if a food has 30 g of total carbohydrates, it would be equal to 2 servings of carbohydrates.  Check the number of grams (g) of saturated and trans fats in one serving. Choose foods that have low  or no amount of these fats.  Check the number of milligrams (mg) of sodium in one serving. Most people should limit total sodium intake to less than 2,300 mg per day.  Always check the nutrition information of foods labeled as "low-fat" or "nonfat". These foods may be higher in added sugar or refined carbohydrates and should be avoided.  Talk to your dietitian to identify your daily  goals for nutrients listed on the label. Shopping  Avoid buying canned, premade, or processed foods. These foods tend to be high in fat, sodium, and added sugar.  Shop around the outside edge of the grocery store. This includes fresh fruits and vegetables, bulk grains, fresh meats, and fresh dairy. Cooking  Use low-heat cooking methods, such as baking, instead of high-heat cooking methods like deep frying.  Cook using healthy oils, such as olive, canola, or sunflower oil.  Avoid cooking with butter, cream, or high-fat meats. Meal planning  Eat meals and snacks regularly, preferably at the same times every day. Avoid going long periods of time without eating.  Eat foods high in fiber, such as fresh fruits, vegetables, beans, and whole grains. Talk to your dietitian about how many servings of carbohydrates you can eat at each meal.  Eat 4-6 ounces of lean protein each day, such as lean meat, chicken, fish, eggs, or tofu. 1 ounce is equal to 1 ounce of meat, chicken, or fish, 1 egg, or 1/4 cup of tofu.  Eat some foods each day that contain healthy fats, such as avocado, nuts, seeds, and fish. Lifestyle   Check your blood glucose regularly.  Exercise at least 30 minutes 5 or more days each week, or as told by your health care provider.  Take medicines as told by your health care provider.  Do not use any products that contain nicotine or tobacco, such as cigarettes and e-cigarettes. If you need help quitting, ask your health care provider.  Work with a Social worker or diabetes educator to identify strategies to manage stress and any emotional and social challenges. What are some questions to ask my health care provider?  Do I need to meet with a diabetes educator?  Do I need to meet with a dietitian?  What number can I call if I have questions?  When are the best times to check my blood glucose? Where to find more information:  American Diabetes Association:  diabetes.org/food-and-fitness/food  Academy of Nutrition and Dietetics: PokerClues.dk  [employer] of Diabetes and Digestive and Kidney Diseases (NIH): ContactWire.be Summary  A healthy meal plan will help you control your blood glucose and maintain a healthy lifestyle.  Working with a diet and nutrition specialist (dietitian) can help you make a meal plan that is best for you.  Keep in mind that carbohydrates and alcohol have immediate effects on your blood glucose levels. It is important to count carbohydrates and to use alcohol carefully. This information is not intended to replace advice given to you by your health care provider. Make sure you discuss any questions you have with your health care provider. Document Released: 01/29/2005 Document Revised: 06/08/2016 Document Reviewed: 06/08/2016 Elsevier Interactive Patient Education  2018 [employer].   IF you received an x-ray today, you will receive an invoice from [facility]. Please contact [facility] at [phone] with questions or concerns regarding your invoice.   IF you received labwork today, you will receive an invoice from [facility]. Please contact [facility] at [phone] with questions or concerns regarding your invoice.  Our billing staff will not be able to assist you with questions regarding bills from these companies.  You will be contacted with the lab results as soon as they are available. The fastest way to get your results is to activate your My Chart account. Instructions are located on the last page of this paperwork. If you have not heard from us regarding the results in 2 weeks, please contact this office.     

## 2017-05-28 NOTE — Progress Notes (Signed)
Marcus Hart  MRN: [id number] DOB: 1966-01-15  PCP: [name], MD  Subjective:  Pt is a pleasant 52 year old male PMH DM and HLD who presents to clinic for DM check. Last OV for this problem 10/2015 with colleague Dr. Carlota Raspberry.   DM - Metformin 1,021m qd. He was not taking this due to not having insurance. He started back on Metformin <3 months ago. He is not taking Onglyza at this time.  He checks home blood sugars: 99-200.  He sees Dr. GKaty Fitchfor eye exam. Last DM eye exam 2017. Plans to make appt soon.  He is not exercising. He avoids sugars.  Denies sock and glove paresthesias, increased thirst, increased urination.  He is not fasting today.  Plans to see Dr. GCarlota Raspberryfor annual exam next month.   Review of Systems  Constitutional: Negative for appetite change and fatigue.  Eyes: Negative for visual disturbance.  Respiratory: Negative for cough and chest tightness.   Cardiovascular: Negative for chest pain, palpitations and leg swelling.  Gastrointestinal: Negative for abdominal pain, constipation and diarrhea.  Endocrine: Negative for polydipsia, polyphagia and polyuria.    Patient Active Problem List   Diagnosis Date Noted  . DM2 (diabetes mellitus, type 2) ([place] 02/13/2013  . Other and unspecified hyperlipidemia 02/13/2013    Current Outpatient Medications on File Prior to Visit  Medication Sig Dispense Refill  . aspirin 81 MG tablet Take 81 mg by mouth daily.    . metFORMIN (GLUCOPHAGE) 1000 MG tablet TAKE 1 TABLET BY MOUTH TWICE DAILY WITH A MEAL 180 tablet 0  . metFORMIN (GLUCOPHAGE) 1000 MG tablet TAKE 1 TABLET BY MOUTH TWICE DAILY WITH A MEAL 180 tablet 0  . lisinopril (PRINIVIL,ZESTRIL) 2.5 MG tablet TAKE 1 TABLET BY MOUTH DAILY (Patient not taking: Reported on 05/28/2017) 90 tablet 0  . rosuvastatin (CRESTOR) 10 MG tablet Take 1 tablet (10 mg total) by mouth daily. (Patient not taking: Reported on 05/28/2017) 90 tablet 1  . saxagliptin HCl (ONGLYZA) 5 MG TABS  tablet Take 1 tablet (5 mg total) by mouth daily. (Patient not taking: Reported on 05/28/2017) 90 tablet 1   No current facility-administered medications on file prior to visit.     No Known Allergies   Objective:  BP 120/70   Pulse 65   Temp 98.3 F (36.8 C) (Oral)   Resp 16   Ht 5' 10.75" (1.797 m)   Wt 172 lb (78 kg)   SpO2 100%   BMI 24.16 kg/m  Diabetic Foot Exam - Simple   Simple Foot Form Diabetic Foot exam was performed with the following findings:  Yes 05/28/2017  5:52 PM  Visual Inspection No deformities, no ulcerations, no other skin breakdown bilaterally:  Yes Sensation Testing Intact to touch and monofilament testing bilaterally:  Yes Pulse Check Posterior Tibialis and Dorsalis pulse intact bilaterally:  Yes Comments    Physical Exam  Constitutional: He is oriented to person, place, and time and well-developed, well-nourished, and in no distress. No distress.  Cardiovascular: Normal rate, regular rhythm and normal heart sounds.  Neurological: He is alert and oriented to person, place, and time. GCS score is 15.  Skin: Skin is warm and dry.  Psychiatric: Mood, memory, affect and judgment normal.  Vitals reviewed.   Lab Results  Component Value Date   HGBA1C 6.9 10/31/2015   Results for orders placed or performed in visit on 05/28/17  POCT glycosylated hemoglobin (Hb A1C)  Result Value Ref Range   Hemoglobin  A1C 7.8     Assessment and Plan :  1. Type 2 diabetes mellitus without complication, without long-term current use of insulin ([place]) 2. Encounter for medication management - HM Diabetes Foot Exam - POCT glycosylated hemoglobin (Hb A1C) - CMP14+EGFR - Microalbumin, urine - Lipid panel - metFORMIN (GLUCOPHAGE) 1000 MG tablet; TAKE 1 TABLET BY MOUTH TWICE DAILY WITH A MEAL  Dispense: 180 tablet; Refill: 1 - saxagliptin HCl (ONGLYZA) 5 MG TABS tablet; Take 1 tablet (5 mg total) by mouth daily.  Dispense: 90 tablet; Refill: 1 - Pt presents for DM  check. Todays A1C is 7.8. Pt was out of medications for quite a while due to lack of insurance but started back on Metformin 1,060m qd about 3 months ago. He is not taking Onglyza at this time as he does not have it - will refill. Home blood sugars: 99-200. Not UTD DM eye exam - plans to make appt w Dr. GKaty Fitchsoon. Annual exam with Dr. GCarlota Raspberrynext month. Labs are pending.   WMercer Pod PA-C  [facility]1/03/2018 5:56 PM

## 2017-05-29 LAB — LIPID PANEL
Chol/HDL Ratio: 3.5 ratio (ref 0.0–5.0)
Cholesterol, Total: 261 mg/dL — ABNORMAL HIGH (ref 100–199)
HDL: 75 mg/dL (ref >39)
LDL Calculated: 156 mg/dL — ABNORMAL HIGH (ref 0–99)
Triglycerides: 149 mg/dL (ref 0–149)
VLDL Cholesterol Cal: 30 mg/dL (ref 5–40)

## 2017-05-29 LAB — CMP14+EGFR
ALT: 16 IU/L (ref 0–44)
AST: 20 IU/L (ref 0–40)
Albumin/Globulin Ratio: 1.9 (ref 1.2–2.2)
Albumin: 4.5 g/dL (ref 3.5–5.5)
Alkaline Phosphatase: 51 IU/L (ref 39–117)
BUN/Creatinine Ratio: 18 (ref 9–20)
BUN: 16 mg/dL (ref 6–24)
Bilirubin Total: <0.2 mg/dL (ref 0.0–1.2)
CO2: 23 mmol/L (ref 20–29)
Calcium: 9.3 mg/dL (ref 8.7–10.2)
Chloride: 99 mmol/L (ref 96–106)
Creatinine, Ser: 0.89 mg/dL (ref 0.76–1.27)
GFR calc Af Amer: 115 mL/min/{1.73_m2} (ref >59)
GFR calc non Af Amer: 100 mL/min/{1.73_m2} (ref >59)
Globulin, Total: 2.4 g/dL (ref 1.5–4.5)
Glucose: 269 mg/dL — ABNORMAL HIGH (ref 65–99)
Potassium: 4.1 mmol/L (ref 3.5–5.2)
Sodium: 137 mmol/L (ref 134–144)
Total Protein: 6.9 g/dL (ref 6.0–8.5)

## 2017-05-29 LAB — MICROALBUMIN, URINE: Microalbumin, Urine: 3.5 ug/mL

## 2017-06-18 ENCOUNTER — Encounter: Payer: Self-pay | Admitting: Family Medicine

## 2017-06-18 ENCOUNTER — Ambulatory Visit (INDEPENDENT_AMBULATORY_CARE_PROVIDER_SITE_OTHER): Payer: BLUE CROSS/BLUE SHIELD | Admitting: Family Medicine

## 2017-06-18 ENCOUNTER — Other Ambulatory Visit: Payer: Self-pay

## 2017-06-18 VITALS — BP 136/76 | HR 70 | Temp 98.4°F | Resp 18 | Ht 70.63 in | Wt 172.0 lb

## 2017-06-18 DIAGNOSIS — Z0000 Encounter for general adult medical examination without abnormal findings: Secondary | ICD-10-CM

## 2017-06-18 DIAGNOSIS — Z23 Encounter for immunization: Secondary | ICD-10-CM | POA: Diagnosis not present

## 2017-06-18 DIAGNOSIS — E119 Type 2 diabetes mellitus without complications: Secondary | ICD-10-CM | POA: Diagnosis not present

## 2017-06-18 DIAGNOSIS — E785 Hyperlipidemia, unspecified: Secondary | ICD-10-CM

## 2017-06-18 DIAGNOSIS — Z125 Encounter for screening for malignant neoplasm of prostate: Secondary | ICD-10-CM

## 2017-06-18 DIAGNOSIS — Z1211 Encounter for screening for malignant neoplasm of colon: Secondary | ICD-10-CM

## 2017-06-18 MED ORDER — ZOSTER VAC RECOMB ADJUVANTED 50 MCG/0.5ML IM SUSR: 0.5000 mL | Freq: Once | INTRAMUSCULAR | 1 refills | Status: AC

## 2017-06-18 NOTE — Progress Notes (Signed)
Subjective:    Patient ID: Marcus Hart, male    DOB: 1965/12/23, 52 y.o.   MRN: [id number]  HPI Marcus Hart is a 52 y.o. male Presents today for: Chief Complaint  Patient presents with  . Annual Exam  Have not seen him since June 2017. Father passed in [place] in Sept 2017. insurance issue last year.   Sore throat past few days. Slight runny nose, min cough. No fever. Feeling a little better. Tylenol helped symptoms.   Diabetes: Lab Results  Component Value Date   HGBA1C 7.8 05/28/2017  seen few weeks ago by [employer]. Restarted Onglyza, continued metformin 1000mg  BID. Had been temporarily off meds a few months ago, but had been on metformin past 3 months. Planned on appointment with eye care provider- advised to schedule. Has not yet scheduled.  Urine microalbumin  January 11th. No new side effects with restarting. onglyza few weeks ago.  Lipid screening: Off Crestor past year due to insurance issue, but tolerated prior.  Lab Results  Component Value Date   CHOL 261 (H) 05/28/2017   HDL 75 05/28/2017   LDLCALC 156 (H) 05/28/2017   TRIG 149 05/28/2017   CHOLHDL 3.5 05/28/2017   Lab Results  Component Value Date   ALT 16 05/28/2017   AST 20 05/28/2017   ALKPHOS 51 05/28/2017   BILITOT <0.2 05/28/2017   Cancer screening: Colonoscopy, has not had. Prostate cancer screening - would like to have testing.  Lab Results  Component Value Date   PSA 0.33 07/24/2015   PSA 0.25 02/13/2013   Immunization History  Administered Date(s) Administered  . Influenza,inj,Quad PF,6+ Mos 02/13/2013, 05/07/2014, 04/06/2015  . Tdap 02/13/2013  Due for Pneumovax. Would like to skip flu vaccine.   Depression screen: Depression screen [facility] 2/9 06/18/2017 06/18/2017 05/28/2017 02/14/2016 11/22/2015  Decreased Interest 0 0 0 0 0  Down, Depressed, Hopeless 0 0 0 0 0  PHQ - 2 Score 0 0 0 0 0   Vision screen:  Visual Acuity Screening   Right eye Left eye Both eyes  Without correction:       With correction: 20/15 20/20 20/20   Ophthalmology follow-up discussed as above  Dentist: Scheduled in February  Exercise: No regular exercise.   Patient Active Problem List   Diagnosis Date Noted  . DM2 (diabetes mellitus, type 2) ([place]) 02/13/2013  . Other and unspecified hyperlipidemia 02/13/2013   Past Medical History:  Diagnosis Date  . Diabetes mellitus ([place])   . Hyperlipidemia    History reviewed. No pertinent surgical history. No Known Allergies Prior to Admission medications   Medication Sig Start Date End Date Taking? Authorizing Provider  aspirin 81 MG tablet Take 81 mg by mouth daily.   Yes [provider]  metFORMIN (GLUCOPHAGE) 1000 MG tablet TAKE 1 TABLET BY MOUTH TWICE DAILY WITH A MEAL 05/28/17  Yes McVey, Gelene Mink, PA-C  saxagliptin HCl (ONGLYZA) 5 MG TABS tablet Take 1 tablet (5 mg total) by mouth daily. 05/28/17  Yes McVey, Gelene Mink, PA-C   Social History   Socioeconomic History  . Marital status: Married    Spouse name: [name]  . Number of children: 4  . Years of education: Not on file  . Highest education level: Not on file  Social Needs  . Financial resource strain: Not on file  . Food insecurity - worry: Not on file  . Food insecurity - inability: Not on file  . Transportation needs - medical: Not on file  .  Transportation needs - non-medical: Not on file  Occupational History  . Occupation: Mining engineer  Tobacco Use  . Smoking status: Never Smoker  . Smokeless tobacco: Never Used  Substance and Sexual Activity  . Alcohol use: Yes    Alcohol/week: 0.0 oz  . Drug use: No  . Sexual activity: Yes  Other Topics Concern  . Not on file  Social History Narrative   Married   Education: College   Exercise: No    Review of Systems 13 point review of systems per patient health survey noted.  Negative other than as above.     Objective:   Physical Exam  Constitutional: He is oriented to person, place, and time. He appears  well-developed and well-nourished.  HENT:  Head: Normocephalic and atraumatic.  Right Ear: Tympanic membrane, external ear and ear canal normal.  Left Ear: Tympanic membrane, external ear and ear canal normal.  Nose: No rhinorrhea.  Mouth/Throat: Oropharynx is clear and moist and mucous membranes are normal. No oropharyngeal exudate or posterior oropharyngeal erythema.  Eyes: Conjunctivae and EOM are normal. Pupils are equal, round, and reactive to light.  Neck: Normal range of motion. Neck supple. No thyromegaly present.  Cardiovascular: Normal rate, regular rhythm, normal heart sounds and intact distal pulses.  No murmur heard. Pulmonary/Chest: Effort normal and breath sounds normal. No respiratory distress. He has no wheezes. He has no rhonchi. He has no rales.  Abdominal: Soft. He exhibits no distension. There is no tenderness. Hernia confirmed negative in the right inguinal area and confirmed negative in the left inguinal area.  Genitourinary: Prostate normal.  Musculoskeletal: Normal range of motion. He exhibits no edema or tenderness.  Lymphadenopathy:    He has no cervical adenopathy.  Neurological: He is alert and oriented to person, place, and time. He has normal reflexes.  Skin: Skin is warm and dry. No rash noted.  Psychiatric: He has a normal mood and affect. His behavior is normal.  Vitals reviewed.  Vitals:   06/18/17 1520  BP: 136/76  Pulse: 70  Resp: 18  Temp: 98.4 F (36.9 C)  TempSrc: Oral  SpO2: 100%  Weight: 172 lb (78 kg)  Height: 5' 10.63" (1.794 m)       Assessment & Plan:    Marcus Hart is a 52 y.o. male Annual physical exam  - -anticipatory guidance as below in AVS, screening labs above. Health maintenance items as above in HPI discussed/recommended as applicable.   Hyperlipidemia, unspecified hyperlipidemia type  - Restart Crestor with history of diabetes. Tolerated previously.  Type 2 diabetes mellitus without complication, without  long-term current use of insulin ([place])  -Now back on Onglyza and metformin. Tolerating both. Recheck A1c in approximately 3 months  Need for prophylactic vaccination against Streptococcus pneumoniae (pneumococcus) - Plan: Pneumococcal polysaccharide vaccine 23-valent greater than or equal to 2yo subcutaneous/IM  -pneumovax given.   Screening for prostate cancer - Plan: PSA  - We discussed pros and cons of prostate cancer screening, and after this discussion, he chose to have screening done. PSA obtained, and no concerning findings on DRE.   Screen for colon cancer - Plan: Ambulatory referral to Gastroenterology  Need for shingles vaccine - Plan: Zoster Vaccine Adjuvanted [facility]) injection sent to pharmacy  Suspected viral respiratory syndrome, continue symptomatic care, RTC precautions if worsening   Meds ordered this encounter  Medications  . Zoster Vaccine Adjuvanted [facility]) injection    Sig: Inject 0.5 mLs into the muscle once for 1 dose. Repeat in 2-6  months.    Dispense:  0.5 mL    Refill:  1   Patient Instructions   Restart Crestor for cholesterol. Repeat testing next visit.   Follow up in 3 months for diabetes. No change in meds for now.   If sore throat and likely cold symptoms are not continuing to improve, please return to discuss further.   Return to the clinic or go to the nearest emergency room if any of your symptoms worsen or new symptoms occur.  Pneumonia vaccine given today, shingles vaccine at your pharmacy.  I referred you for colonoscopy, but please schedule ophthalmology/eye care provider visit  Upper Respiratory Infection, Adult Most upper respiratory infections (URIs) are caused by a virus. A URI affects the nose, throat, and upper air passages. The most common type of URI is often called "the common cold." Follow these instructions at home:  Take medicines only as told by your doctor.  Gargle warm saltwater or take cough drops to comfort your  throat as told by your doctor.  Use a warm mist humidifier or inhale steam from a shower to increase air moisture. This may make it easier to breathe.  Drink enough fluid to keep your pee (urine) clear or pale yellow.  Eat soups and other clear broths.  Have a healthy diet.  Rest as needed.  Go back to work when your fever is gone or your doctor says it is okay. ? You may need to stay home longer to avoid giving your URI to others. ? You can also wear a face mask and wash your hands often to prevent spread of the virus.  Use your inhaler more if you have asthma.  Do not use any tobacco products, including cigarettes, chewing tobacco, or electronic cigarettes. If you need help quitting, ask your doctor. Contact a doctor if:  You are getting worse, not better.  Your symptoms are not helped by medicine.  You have chills.  You are getting more short of breath.  You have brown or red mucus.  You have yellow or brown discharge from your nose.  You have pain in your face, especially when you bend forward.  You have a fever.  You have puffy (swollen) neck glands.  You have pain while swallowing.  You have white areas in the back of your throat. Get help right away if:  You have very bad or constant: ? Headache. ? Ear pain. ? Pain in your forehead, behind your eyes, and over your cheekbones (sinus pain). ? Chest pain.  You have long-lasting (chronic) lung disease and any of the following: ? Wheezing. ? Long-lasting cough. ? Coughing up blood. ? A change in your usual mucus.  You have a stiff neck.  You have changes in your: ? Vision. ? Hearing. ? Thinking. ? Mood. This information is not intended to replace advice given to you by your health care provider. Make sure you discuss any questions you have with your health care provider. Document Released: 10/21/2007 Document Revised: 01/05/2016 Document Reviewed: 08/09/2013 Elsevier Interactive Patient Education   2018 [place] you healthy  Get these tests  Blood pressure- Have your blood pressure checked once a year by your healthcare provider.  Normal blood pressure is 120/80  Weight- Have your body mass index (BMI) calculated to screen for obesity.  BMI is a measure of body fat based on height and weight. You can also calculate your own BMI at ViewBank[employer].si.  Cholesterol- Have your cholesterol checked every  year.  Diabetes- Have your blood sugar checked regularly if you have high blood pressure, high cholesterol, have a family history of diabetes or if you are overweight.  Screening for Colon Cancer- Colonoscopy starting at age 14.  Screening may begin sooner depending on your family history and other health conditions. Follow up colonoscopy as directed by your Gastroenterologist.  Screening for Prostate Cancer- Both blood work (PSA) and a rectal exam help screen for Prostate Cancer.  Screening begins at age 52 with African-American men and at age 85 with Caucasian men.  Screening may begin sooner depending on your family history.  Take these medicines  Aspirin- One aspirin daily can help prevent Heart disease and Stroke.  Flu shot- Every fall.  Tetanus- Every 10 years.  Zostavax- Once after the age of 51 to prevent Shingles.  Pneumonia shot- Once after the age of 50; if you are younger than 60, ask your healthcare provider if you need a Pneumonia shot.  Take these steps  Don't smoke- If you do smoke, talk to your doctor about quitting.  For tips on how to quit, go to www.smokefree.gov or call 1-800-QUIT-NOW.  Be physically active- Exercise 5 days a week for at least 30 minutes.  If you are not already physically active start slow and gradually work up to 30 minutes of moderate physical activity.  Examples of moderate activity include walking briskly, mowing the yard, dancing, swimming, bicycling, etc.  Eat a healthy diet- Eat a variety of healthy food such as  fruits, vegetables, low fat milk, low fat cheese, yogurt, lean meant, poultry, fish, beans, tofu, etc. For more information go to www.thenutritionsource.org  Drink alcohol in moderation- Limit alcohol intake to less than two drinks a day. Never drink and drive.  Dentist- Brush and floss twice daily; visit your dentist twice a year.  Depression- Your emotional health is as important as your physical health. If you're feeling down, or losing interest in things you would normally enjoy please talk to your healthcare provider.  Eye exam- Visit your eye doctor every year.  Safe sex- If you may be exposed to a sexually transmitted infection, use a condom.  Seat belts- Seat belts can save your life; always wear one.  Smoke/Carbon Monoxide detectors- These detectors need to be installed on the appropriate level of your home.  Replace batteries at least once a year.  Skin cancer- When out in the sun, cover up and use sunscreen 15 SPF or higher.  Violence- If anyone is threatening you, please tell your healthcare provider.  Living Will/ Health care power of attorney- Speak with your healthcare provider and family.  IF you received an x-ray today, you will receive an invoice from [facility]. Please contact [facility] at [phone] with questions or concerns regarding your invoice.   IF you received labwork today, you will receive an invoice from [place]. Please contact [facility] at [phone] with questions or concerns regarding your invoice.   Our billing staff will not be able to assist you with questions regarding bills from these companies.  You will be contacted with the lab results as soon as they are available. The fastest way to get your results is to activate your My Chart account. Instructions are located on the last page of this paperwork. If you have not heard from [place] regarding the results in 2 weeks, please contact this office.      Signed,   [name], MD [facility] [place].  06/19/17 3:44 PM

## 2017-06-18 NOTE — Patient Instructions (Addendum)
Restart Crestor for cholesterol. Repeat testing next visit.   Follow up in 3 months for diabetes. No change in meds for now.   If sore throat and likely cold symptoms are not continuing to improve, please return to discuss further.   Return to the clinic or go to the nearest emergency room if any of your symptoms worsen or new symptoms occur.  Pneumonia vaccine given today, shingles vaccine at your pharmacy.  I referred you for colonoscopy, but please schedule ophthalmology/eye care provider visit  Upper Respiratory Infection, Adult Most upper respiratory infections (URIs) are caused by a virus. A URI affects the nose, throat, and upper air passages. The most common type of URI is often called "the common cold." Follow these instructions at home:  Take medicines only as told by your doctor.  Gargle warm saltwater or take cough drops to comfort your throat as told by your doctor.  Use a warm mist humidifier or inhale steam from a shower to increase air moisture. This may make it easier to breathe.  Drink enough fluid to keep your pee (urine) clear or pale yellow.  Eat soups and other clear broths.  Have a healthy diet.  Rest as needed.  Go back to work when your fever is gone or your doctor says it is okay. ? You may need to stay home longer to avoid giving your URI to others. ? You can also wear a face mask and wash your hands often to prevent spread of the virus.  Use your inhaler more if you have asthma.  Do not use any tobacco products, including cigarettes, chewing tobacco, or electronic cigarettes. If you need help quitting, ask your doctor. Contact a doctor if:  You are getting worse, not better.  Your symptoms are not helped by medicine.  You have chills.  You are getting more short of breath.  You have brown or red mucus.  You have yellow or brown discharge from your nose.  You have pain in your face, especially when you bend forward.  You have a  fever.  You have puffy (swollen) neck glands.  You have pain while swallowing.  You have white areas in the back of your throat. Get help right away if:  You have very bad or constant: ? Headache. ? Ear pain. ? Pain in your forehead, behind your eyes, and over your cheekbones (sinus pain). ? Chest pain.  You have long-lasting (chronic) lung disease and any of the following: ? Wheezing. ? Long-lasting cough. ? Coughing up blood. ? A change in your usual mucus.  You have a stiff neck.  You have changes in your: ? Vision. ? Hearing. ? Thinking. ? Mood. This information is not intended to replace advice given to you by your health care provider. Make sure you discuss any questions you have with your health care provider. Document Released: 10/21/2007 Document Revised: 01/05/2016 Document Reviewed: 08/09/2013 Elsevier Interactive Patient Education  2018 [place] you healthy  Get these tests  Blood pressure- Have your blood pressure checked once a year by your healthcare provider.  Normal blood pressure is 120/80  Weight- Have your body mass index (BMI) calculated to screen for obesity.  BMI is a measure of body fat based on height and weight. You can also calculate your own BMI at ViewBanking.si.  Cholesterol- Have your cholesterol checked every year.  Diabetes- Have your blood sugar checked regularly if you have high blood pressure, high cholesterol, have a family history  of diabetes or if you are overweight.  Screening for Colon Cancer- Colonoscopy starting at age 73.  Screening may begin sooner depending on your family history and other health conditions. Follow up colonoscopy as directed by your Gastroenterologist.  Screening for Prostate Cancer- Both blood work (PSA) and a rectal exam help screen for Prostate Cancer.  Screening begins at age 75 with African-American men and at age 68 with Caucasian men.  Screening may begin sooner depending on  your family history.  Take these medicines  Aspirin- One aspirin daily can help prevent Heart disease and Stroke.  Flu shot- Every fall.  Tetanus- Every 10 years.  Zostavax- Once after the age of 58 to prevent Shingles.  Pneumonia shot- Once after the age of 60; if you are younger than 66, ask your healthcare provider if you need a Pneumonia shot.  Take these steps  Don't smoke- If you do smoke, talk to your doctor about quitting.  For tips on how to quit, go to www.smokefree.gov or call 1-800-QUIT-NOW.  Be physically active- Exercise 5 days a week for at least 30 minutes.  If you are not already physically active start slow and gradually work up to 30 minutes of moderate physical activity.  Examples of moderate activity include walking briskly, mowing the yard, dancing, swimming, bicycling, etc.  Eat a healthy diet- Eat a variety of healthy food such as fruits, vegetables, low fat milk, low fat cheese, yogurt, lean meant, poultry, fish, beans, tofu, etc. For more information go to www.thenutritionsource.org  Drink alcohol in moderation- Limit alcohol intake to less than two drinks a day. Never drink and drive.  Dentist- Brush and floss twice daily; visit your dentist twice a year.  Depression- Your emotional health is as important as your physical health. If you're feeling down, or losing interest in things you would normally enjoy please talk to your healthcare provider.  Eye exam- Visit your eye doctor every year.  Safe sex- If you may be exposed to a sexually transmitted infection, use a condom.  Seat belts- Seat belts can save your life; always wear one.  Smoke/Carbon Monoxide detectors- These detectors need to be installed on the appropriate level of your home.  Replace batteries at least once a year.  Skin cancer- When out in the sun, cover up and use sunscreen 15 SPF or higher.  Violence- If anyone is threatening you, please tell your healthcare provider.  Living Will/  Health care power of attorney- Speak with your healthcare provider and family.  IF you received an x-ray today, you will receive an invoice from [facility]. Please contact [facility] at [phone] with questions or concerns regarding your invoice.   IF you received labwork today, you will receive an invoice from [facility]. Please contact [facility] at [phone] with questions or concerns regarding your invoice.   Our billing staff will not be able to assist you with questions regarding bills from these companies.  You will be contacted with the lab results as soon as they are available. The fastest way to get your results is to activate your My Chart account. Instructions are located on the last page of this paperwork. If you have not heard from [place] regarding the results in 2 weeks, please contact this office.

## 2017-06-19 LAB — PSA: Prostate Specific Ag, Serum: 0.3 ng/mL (ref 0.0–4.0)

## 2017-07-01 ENCOUNTER — Encounter: Payer: Self-pay | Admitting: Gastroenterology

## 2017-08-26 ENCOUNTER — Other Ambulatory Visit: Payer: Self-pay | Admitting: Family Medicine

## 2017-08-26 DIAGNOSIS — E119 Type 2 diabetes mellitus without complications: Secondary | ICD-10-CM

## 2017-08-27 ENCOUNTER — Other Ambulatory Visit: Payer: Self-pay | Admitting: Family Medicine

## 2017-08-27 DIAGNOSIS — E785 Hyperlipidemia, unspecified: Secondary | ICD-10-CM

## 2017-09-02 ENCOUNTER — Encounter: Payer: Self-pay | Admitting: Gastroenterology

## 2017-09-09 ENCOUNTER — Telehealth: Payer: Self-pay

## 2017-09-09 NOTE — Telephone Encounter (Signed)
Patient No Showed for Pre-Visit. Left message to call and reschedule PV. If patient does not call by 5:00 Pm a no show letter will be mailed and colonoscopy will be cancelled per [place] guidelines.    Riki Sheer, LPN

## 2017-09-13 ENCOUNTER — Other Ambulatory Visit: Payer: Self-pay

## 2017-09-13 ENCOUNTER — Encounter: Payer: Self-pay | Admitting: *Deleted

## 2017-09-13 ENCOUNTER — Ambulatory Visit (AMBULATORY_SURGERY_CENTER): Payer: Self-pay | Admitting: *Deleted

## 2017-09-13 VITALS — Ht 71.0 in | Wt 172.8 lb

## 2017-09-13 DIAGNOSIS — Z1211 Encounter for screening for malignant neoplasm of colon: Secondary | ICD-10-CM

## 2017-09-13 MED ORDER — PEG-KCL-NACL-NASULF-NA ASC-C 140 G PO SOLR: 1.0000 | ORAL | 0 refills | Status: DC

## 2017-09-13 NOTE — Progress Notes (Signed)
No egg or soy allergy known to patient  No issues with past sedation with any surgeries  or procedures, no intubation problems  No diet pills per patient No home 02 use per patient  No blood thinners per patient  Pt denies issues with constipation  No A fib or A flutter  EMMI video sent to pt's e mail  [employer] plenvu coupon to drop prep to $50 given to pt in Pv today-  Rs pt to 6-12 as he has to fast for Ramadan the month of may  Wife in pV with pt today

## 2017-09-16 ENCOUNTER — Encounter: Payer: Self-pay | Admitting: Family Medicine

## 2017-09-16 ENCOUNTER — Other Ambulatory Visit: Payer: Self-pay

## 2017-09-16 ENCOUNTER — Ambulatory Visit: Payer: BLUE CROSS/BLUE SHIELD | Admitting: Family Medicine

## 2017-09-16 VITALS — BP 130/68 | HR 75 | Temp 98.6°F | Ht 71.0 in | Wt 172.4 lb

## 2017-09-16 DIAGNOSIS — Z794 Long term (current) use of insulin: Secondary | ICD-10-CM | POA: Diagnosis not present

## 2017-09-16 DIAGNOSIS — E785 Hyperlipidemia, unspecified: Secondary | ICD-10-CM | POA: Diagnosis not present

## 2017-09-16 DIAGNOSIS — E119 Type 2 diabetes mellitus without complications: Secondary | ICD-10-CM

## 2017-09-16 LAB — HM DIABETES EYE EXAM

## 2017-09-16 LAB — POCT GLYCOSYLATED HEMOGLOBIN (HGB A1C): HEMOGLOBIN A1C: 7.1

## 2017-09-16 MED ORDER — SAXAGLIPTIN HCL 5 MG PO TABS: 5.0000 mg | ORAL_TABLET | Freq: Every day | ORAL | 1 refills | Status: DC

## 2017-09-16 MED ORDER — METFORMIN HCL 1000 MG PO TABS: ORAL_TABLET | ORAL | 1 refills | Status: DC

## 2017-09-16 MED ORDER — ROSUVASTATIN CALCIUM 10 MG PO TABS: 10.0000 mg | ORAL_TABLET | Freq: Every day | ORAL | 1 refills | Status: DC

## 2017-09-16 NOTE — Patient Instructions (Addendum)
No changes in diabetes meds for now.  restrart crestor once per day and then return in 6 weeks for fasting lab visit only.   Appointment with me in 3 months. Thank you for coming in today.    Diabetes Mellitus and Nutrition When you have diabetes (diabetes mellitus), it is very important to have healthy eating habits because your blood sugar (glucose) levels are greatly affected by what you eat and drink. Eating healthy foods in the appropriate amounts, at about the same times every day, can help you:  Control your blood glucose.  Lower your risk of heart disease.  Improve your blood pressure.  Reach or maintain a healthy weight.  Every person with diabetes is different, and each person has different needs for a meal plan. Your health care provider may recommend that you work with a diet and nutrition specialist (dietitian) to make a meal plan that is best for you. Your meal plan may vary depending on factors such as:  The calories you need.  The medicines you take.  Your weight.  Your blood glucose, blood pressure, and cholesterol levels.  Your activity level.  Other health conditions you have, such as heart or kidney disease.  How do carbohydrates affect me? Carbohydrates affect your blood glucose level more than any other type of food. Eating carbohydrates naturally increases the amount of glucose in your blood. Carbohydrate counting is a method for keeping track of how many carbohydrates you eat. Counting carbohydrates is important to keep your blood glucose at a healthy level, especially if you use insulin or take certain oral diabetes medicines. It is important to know how many carbohydrates you can safely have in each meal. This is different for every person. Your dietitian can help you calculate how many carbohydrates you should have at each meal and for snack. Foods that contain carbohydrates include:  Bread, cereal, rice, pasta, and crackers.  Potatoes and  corn.  Peas, beans, and lentils.  Milk and yogurt.  Fruit and juice.  Desserts, such as cakes, cookies, ice cream, and candy.  How does alcohol affect me? Alcohol can cause a sudden decrease in blood glucose (hypoglycemia), especially if you use insulin or take certain oral diabetes medicines. Hypoglycemia can be a life-threatening condition. Symptoms of hypoglycemia (sleepiness, dizziness, and confusion) are similar to symptoms of having too much alcohol. If your health care provider says that alcohol is safe for you, follow these guidelines:  Limit alcohol intake to no more than 1 drink per day for nonpregnant women and 2 drinks per day for men. One drink equals 12 oz of beer, 5 oz of wine, or 1 oz of hard liquor.  Do not drink on an empty stomach.  Keep yourself hydrated with water, diet soda, or unsweetened iced tea.  Keep in mind that regular soda, juice, and other mixers may contain a lot of sugar and must be counted as carbohydrates.  What are tips for following this plan? Reading food labels  Start by checking the serving size on the label. The amount of calories, carbohydrates, fats, and other nutrients listed on the label are based on one serving of the food. Many foods contain more than one serving per package.  Check the total grams (g) of carbohydrates in one serving. You can calculate the number of servings of carbohydrates in one serving by dividing the total carbohydrates by 15. For example, if a food has 30 g of total carbohydrates, it would be equal to 2 servings of  carbohydrates.  Check the number of grams (g) of saturated and trans fats in one serving. Choose foods that have low or no amount of these fats.  Check the number of milligrams (mg) of sodium in one serving. Most people should limit total sodium intake to less than 2,300 mg per day.  Always check the nutrition information of foods labeled as "low-fat" or "nonfat". These foods may be higher in added  sugar or refined carbohydrates and should be avoided.  Talk to your dietitian to identify your daily goals for nutrients listed on the label. Shopping  Avoid buying canned, premade, or processed foods. These foods tend to be high in fat, sodium, and added sugar.  Shop around the outside edge of the grocery store. This includes fresh fruits and vegetables, bulk grains, fresh meats, and fresh dairy. Cooking  Use low-heat cooking methods, such as baking, instead of high-heat cooking methods like deep frying.  Cook using healthy oils, such as olive, canola, or sunflower oil.  Avoid cooking with butter, cream, or high-fat meats. Meal planning  Eat meals and snacks regularly, preferably at the same times every day. Avoid going long periods of time without eating.  Eat foods high in fiber, such as fresh fruits, vegetables, beans, and whole grains. Talk to your dietitian about how many servings of carbohydrates you can eat at each meal.  Eat 4-6 ounces of lean protein each day, such as lean meat, chicken, fish, eggs, or tofu. 1 ounce is equal to 1 ounce of meat, chicken, or fish, 1 egg, or 1/4 cup of tofu.  Eat some foods each day that contain healthy fats, such as avocado, nuts, seeds, and fish. Lifestyle   Check your blood glucose regularly.  Exercise at least 30 minutes 5 or more days each week, or as told by your health care provider.  Take medicines as told by your health care provider.  Do not use any products that contain nicotine or tobacco, such as cigarettes and e-cigarettes. If you need help quitting, ask your health care provider.  Work with a Social worker or diabetes educator to identify strategies to manage stress and any emotional and social challenges. What are some questions to ask my health care provider?  Do I need to meet with a diabetes educator?  Do I need to meet with a dietitian?  What number can I call if I have questions?  When are the best times to check my  blood glucose? Where to find more information:  American Diabetes Association: diabetes.org/food-and-fitness/food  Academy of Nutrition and Dietetics: PokerClues.dk  [employer] of Diabetes and Digestive and Kidney Diseases (NIH): ContactWire.be Summary  A healthy meal plan will help you control your blood glucose and maintain a healthy lifestyle.  Working with a diet and nutrition specialist (dietitian) can help you make a meal plan that is best for you.  Keep in mind that carbohydrates and alcohol have immediate effects on your blood glucose levels. It is important to count carbohydrates and to use alcohol carefully. This information is not intended to replace advice given to you by your health care provider. Make sure you discuss any questions you have with your health care provider. Document Released: 01/29/2005 Document Revised: 06/08/2016 Document Reviewed: 06/08/2016 Elsevier Interactive Patient Education  2018 [place] for Eating Away From Home If You Have Diabetes Controlling your level of blood glucose, also known as blood sugar, can be challenging. It can be even more difficult when you do not  prepare your own meals. The following tips can help you manage your diabetes when you eat away from home. Planning ahead Plan ahead if you know you will be eating away from home:  Ask your health care provider how to time meals and medicine if you are taking insulin.  Make a list of restaurants near you that offer healthy choices. If they have a carry-out menu, take it home and plan what you will order ahead of time.  Look up the restaurant you want to eat at online. Many chain and fast-food restaurants list nutritional information online. Use this information to choose the healthiest options and to calculate how many carbohydrates will be in your  meal.  Use a carbohydrate-counting book or mobile app to look up the carbohydrate content and serving size of the foods you want to eat.  Become familiar with serving sizes and learn to recognize how many servings are in a portion. This will allow you to estimate how many carbohydrates you can eat.  Free foods A "free food" is any food or drink that has less than 5 g of carbohydrates per serving. Free foods include:  Many vegetables.  Hard boiled eggs.  Nuts or seeds.  Olives.  Cheeses.  Meats.  These types of foods make good appetizer choices and are often available at salad bars. Lemon juice, vinegar, or a low-calorie salad dressing of fewer than 20 calories per serving can be used as a "free" salad dressing. Choices to reduce carbohydrates  Substitute nonfat sweetened yogurt with a sugar-free yogurt. Yogurt made from soy milk may also be used, but you will still want a sugar-free or plain option to choose a lower carbohydrate amount.  Ask your server to take away the bread basket or chips from your table.  Order fresh fruit. A salad bar often offers fresh fruit choices. Avoid canned fruit because it is usually packed in sugar or syrup.  Order a salad, and eat it without dressing. Or, create a "free" salad dressing.  Ask for substitutions. For example, instead of [place] fries, request an order of a vegetable such as salad, green beans, or broccoli. Other tips  If you take insulin, take the insulin once your food arrives to your table. This will ensure your insulin and food are timed correctly.  Ask your server about the portion size before your order, and ask for a take-out box if the portion has more servings than you should have. When your food comes, leave the amount you should have on the plate, and put the rest in the take-out box.  Consider splitting an entree with someone and ordering a side salad. This information is not intended to replace advice given to you by  your health care provider. Make sure you discuss any questions you have with your health care provider. Document Released: 05/04/2005 Document Revised: 10/10/2015 Document Reviewed: 08/01/2013 Elsevier Interactive Patient Education  2018 [employer].    IF you received an x-ray today, you will receive an invoice from [facility]. Please contact [facility] at [phone] with questions or concerns regarding your invoice.   IF you received labwork today, you will receive an invoice from [place]. Please contact [facility] at [phone] with questions or concerns regarding your invoice.   Our billing staff will not be able to assist you with questions regarding bills from these companies.  You will be contacted with the lab results as soon as they are available. The fastest way to get your results is  to activate your My Chart account. Instructions are located on the last page of this paperwork. If you have not heard from us regarding the results in 2 weeks, please contact this office.      

## 2017-09-16 NOTE — Progress Notes (Signed)
Subjective:  By signing my name below, I, Marcus Hart, attest that this documentation has been prepared under the direction and in the presence of Marcus Agreste, MD Electronically Signed: Ladene Artist, ED Scribe 09/16/2017 at 2:33 PM.   Patient ID: Marcus Hart, male    DOB: Dec 23, 1965, 52 y.o.   MRN: [id number]  Chief Complaint  Patient presents with  . Chonic Condition    3 month follow up    HPI Marcus Hart is a 52 y.o. male who presents to Primary Care at [facility] for f/u of DM. Last seen 2/1 for CPE. When last seen he had just started onglyza and continued metformin 1000 mg bid. Was using metformin at last OV. Recommended scheduling optho appointment. Was tolerating onglyza. Urine micro albumin 05/2017, normal at 3.5. Pneumovax 06/18/17. Foot exam: 05/28/17. - States he eats fast foods sometimes. He does not exercise outside of his physical job. Reports home glucose readings averaging 120. Denies numbness/tingling in extremities, any symptoms or hypoglycemic episodes. He has an appointment with optho today.  Hyperlipidemia Lab Results  Component Value Date   CHOL 261 (H) 05/28/2017   HDL 75 05/28/2017   LDLCALC 156 (H) 05/28/2017   TRIG 149 05/28/2017   CHOLHDL 3.5 05/28/2017   Lab Results  Component Value Date   ALT 16 05/28/2017   AST 20 05/28/2017   ALKPHOS 51 05/28/2017   BILITOT <0.2 05/28/2017  Previously taken Crestor but was off last visit. - Pt has not restarted Crestor.  Patient Active Problem List   Diagnosis Date Noted  . DM2 (diabetes mellitus, type 2) ([place]) 02/13/2013  . Other and unspecified hyperlipidemia 02/13/2013   Past Medical History:  Diagnosis Date  . Diabetes mellitus ([place])   . Hyperlipidemia    Past Surgical History:  Procedure Laterality Date  . NO PAST SURGERIES     No Known Allergies Prior to Admission medications   Medication Sig Start Date End Date Taking? Authorizing Provider  aspirin 81 MG tablet Take 81 mg by mouth daily.     [provider]  metFORMIN (GLUCOPHAGE) 1000 MG tablet TAKE 1 TABLET BY MOUTH TWICE DAILY WITH A MEAL 05/28/17   McVey, Gelene Mink, PA-C  PEG-KCl-NaCl-NaSulf-Na Asc-C (PLENVU) 140 g SOLR Take 1 kit by mouth as directed. 09/13/17   Nelida Meuse III, MD  saxagliptin HCl (ONGLYZA) 5 MG TABS tablet Take 1 tablet (5 mg total) by mouth daily. 05/28/17   McVey, Gelene Mink, PA-C   Social History   Socioeconomic History  . Marital status: Married    Spouse name: [name]  . Number of children: 4  . Years of education: Not on file  . Highest education level: Not on file  Occupational History  . Occupation: Mining engineer  Social Needs  . Financial resource strain: Not on file  . Food insecurity:    Worry: Not on file    Inability: Not on file  . Transportation needs:    Medical: Not on file    Non-medical: Not on file  Tobacco Use  . Smoking status: Never Smoker  . Smokeless tobacco: Never Used  Substance and Sexual Activity  . Alcohol use: Never    Alcohol/week: 0.0 oz    Frequency: Never  . Drug use: No  . Sexual activity: Yes  Lifestyle  . Physical activity:    Days per week: Not on file    Minutes per session: Not on file  . Stress: Not on file  Relationships  .  Social connections:    Talks on phone: Not on file    Gets together: Not on file    Attends religious service: Not on file    Active member of club or organization: Not on file    Attends meetings of clubs or organizations: Not on file    Relationship status: Not on file  . Intimate partner violence:    Fear of current or ex partner: Not on file    Emotionally abused: Not on file    Physically abused: Not on file    Forced sexual activity: Not on file  Other Topics Concern  . Not on file  Social History Narrative   Married   Education: College   Exercise: No   Review of Systems  Constitutional: Negative for fatigue and unexpected weight change.  Eyes: Negative for visual disturbance.    Respiratory: Negative for cough, chest tightness and shortness of breath.   Cardiovascular: Negative for chest pain, palpitations and leg swelling.  Gastrointestinal: Negative for abdominal pain and blood in stool.  Neurological: Negative for dizziness, light-headedness, numbness and headaches.      Objective:   Physical Exam  Constitutional: He is oriented to person, place, and time. He appears well-developed and well-nourished.  HENT:  Head: Normocephalic and atraumatic.  Eyes: Pupils are equal, round, and reactive to light. EOM are normal.  Neck: No JVD present. Carotid bruit is not present.  Cardiovascular: Normal rate, regular rhythm and normal heart sounds.  No murmur heard. Pulmonary/Chest: Effort normal and breath sounds normal. He has no rales.  Musculoskeletal: He exhibits no edema.  Neurological: He is alert and oriented to person, place, and time.  Skin: Skin is warm and dry.  Psychiatric: He has a normal mood and affect.  Vitals reviewed.   Vitals:   09/16/17 1416  BP: 130/68  Pulse: 75  Temp: 98.6 F (37 C)  TempSrc: Oral  SpO2: 100%  Weight: 172 lb 6.4 oz (78.2 kg)  Height: '5\' 11"'  (1.803 m)   Results for orders placed or performed in visit on 09/16/17  POCT glycosylated hemoglobin (Hb A1C)  Result Value Ref Range   Hemoglobin A1C 7.1       Assessment & Plan:    [name] is a 52 y.o. male Type 2 diabetes mellitus without complication, with long-term current use of insulin ([place]) - Plan: POCT glycosylated hemoglobin (Hb A1C) Type 2 diabetes mellitus without complication, without long-term current use of insulin (HCC) - Plan: saxagliptin HCl (ONGLYZA) 5 MG TABS tablet, metFORMIN (GLUCOPHAGE) 1000 MG tablet  -Tolerating current regimen, A1c near control, but ideally would like to see him in the lower 6 range.  Will continue same dose of medications for now, handout given on diet and tips for eating away from home with repeat testing in 3  months.  Hyperlipidemia, unspecified hyperlipidemia type - Plan: Comprehensive metabolic panel, Lipid panel, rosuvastatin (CRESTOR) 10 MG tablet, CANCELED: Lipid panel, CANCELED: Comprehensive metabolic panel  -Has not yet restarted Crestor, restart Crestor and then return in 6 weeks for lab only visit.  Meds ordered this encounter  Medications  . saxagliptin HCl (ONGLYZA) 5 MG TABS tablet    Sig: Take 1 tablet (5 mg total) by mouth daily.    Dispense:  90 tablet    Refill:  1  . metFORMIN (GLUCOPHAGE) 1000 MG tablet    Sig: TAKE 1 TABLET BY MOUTH TWICE DAILY WITH A MEAL    Dispense:  180 tablet  Refill:  1  . rosuvastatin (CRESTOR) 10 MG tablet    Sig: Take 1 tablet (10 mg total) by mouth daily.    Dispense:  90 tablet    Refill:  1   Patient Instructions   No changes in diabetes meds for now.  restrart crestor once per day and then return in 6 weeks for fasting lab visit only.   Appointment with me in 3 months. Thank you for coming in today.    Diabetes Mellitus and Nutrition When you have diabetes (diabetes mellitus), it is very important to have healthy eating habits because your blood sugar (glucose) levels are greatly affected by what you eat and drink. Eating healthy foods in the appropriate amounts, at about the same times every day, can help you:  Control your blood glucose.  Lower your risk of heart disease.  Improve your blood pressure.  Reach or maintain a healthy weight.  Every person with diabetes is different, and each person has different needs for a meal plan. Your health care provider may recommend that you work with a diet and nutrition specialist (dietitian) to make a meal plan that is best for you. Your meal plan may vary depending on factors such as:  The calories you need.  The medicines you take.  Your weight.  Your blood glucose, blood pressure, and cholesterol levels.  Your activity level.  Other health conditions you have, such as heart  or kidney disease.  How do carbohydrates affect me? Carbohydrates affect your blood glucose level more than any other type of food. Eating carbohydrates naturally increases the amount of glucose in your blood. Carbohydrate counting is a method for keeping track of how many carbohydrates you eat. Counting carbohydrates is important to keep your blood glucose at a healthy level, especially if you use insulin or take certain oral diabetes medicines. It is important to know how many carbohydrates you can safely have in each meal. This is different for every person. Your dietitian can help you calculate how many carbohydrates you should have at each meal and for snack. Foods that contain carbohydrates include:  Bread, cereal, rice, pasta, and crackers.  Potatoes and corn.  Peas, beans, and lentils.  Milk and yogurt.  Fruit and juice.  Desserts, such as cakes, cookies, ice cream, and candy.  How does alcohol affect me? Alcohol can cause a sudden decrease in blood glucose (hypoglycemia), especially if you use insulin or take certain oral diabetes medicines. Hypoglycemia can be a life-threatening condition. Symptoms of hypoglycemia (sleepiness, dizziness, and confusion) are similar to symptoms of having too much alcohol. If your health care provider says that alcohol is safe for you, follow these guidelines:  Limit alcohol intake to no more than 1 drink per day for nonpregnant women and 2 drinks per day for men. One drink equals 12 oz of beer, 5 oz of wine, or 1 oz of hard liquor.  Do not drink on an empty stomach.  Keep yourself hydrated with water, diet soda, or unsweetened iced tea.  Keep in mind that regular soda, juice, and other mixers may contain a lot of sugar and must be counted as carbohydrates.  What are tips for following this plan? Reading food labels  Start by checking the serving size on the label. The amount of calories, carbohydrates, fats, and other nutrients listed on  the label are based on one serving of the food. Many foods contain more than one serving per package.  Check the total grams (g)  of carbohydrates in one serving. You can calculate the number of servings of carbohydrates in one serving by dividing the total carbohydrates by 15. For example, if a food has 30 g of total carbohydrates, it would be equal to 2 servings of carbohydrates.  Check the number of grams (g) of saturated and trans fats in one serving. Choose foods that have low or no amount of these fats.  Check the number of milligrams (mg) of sodium in one serving. Most people should limit total sodium intake to less than 2,300 mg per day.  Always check the nutrition information of foods labeled as "low-fat" or "nonfat". These foods may be higher in added sugar or refined carbohydrates and should be avoided.  Talk to your dietitian to identify your daily goals for nutrients listed on the label. Shopping  Avoid buying canned, premade, or processed foods. These foods tend to be high in fat, sodium, and added sugar.  Shop around the outside edge of the grocery store. This includes fresh fruits and vegetables, bulk grains, fresh meats, and fresh dairy. Cooking  Use low-heat cooking methods, such as baking, instead of high-heat cooking methods like deep frying.  Cook using healthy oils, such as olive, canola, or sunflower oil.  Avoid cooking with butter, cream, or high-fat meats. Meal planning  Eat meals and snacks regularly, preferably at the same times every day. Avoid going long periods of time without eating.  Eat foods high in fiber, such as fresh fruits, vegetables, beans, and whole grains. Talk to your dietitian about how many servings of carbohydrates you can eat at each meal.  Eat 4-6 ounces of lean protein each day, such as lean meat, chicken, fish, eggs, or tofu. 1 ounce is equal to 1 ounce of meat, chicken, or fish, 1 egg, or 1/4 cup of tofu.  Eat some foods each day that  contain healthy fats, such as avocado, nuts, seeds, and fish. Lifestyle   Check your blood glucose regularly.  Exercise at least 30 minutes 5 or more days each week, or as told by your health care provider.  Take medicines as told by your health care provider.  Do not use any products that contain nicotine or tobacco, such as cigarettes and e-cigarettes. If you need help quitting, ask your health care provider.  Work with a Social worker or diabetes educator to identify strategies to manage stress and any emotional and social challenges. What are some questions to ask my health care provider?  Do I need to meet with a diabetes educator?  Do I need to meet with a dietitian?  What number can I call if I have questions?  When are the best times to check my blood glucose? Where to find more information:  American Diabetes Association: diabetes.org/food-and-fitness/food  Academy of Nutrition and Dietetics: PokerClues.dk  [employer] of Diabetes and Digestive and Kidney Diseases (NIH): ContactWire.be Summary  A healthy meal plan will help you control your blood glucose and maintain a healthy lifestyle.  Working with a diet and nutrition specialist (dietitian) can help you make a meal plan that is best for you.  Keep in mind that carbohydrates and alcohol have immediate effects on your blood glucose levels. It is important to count carbohydrates and to use alcohol carefully. This information is not intended to replace advice given to you by your health care provider. Make sure you discuss any questions you have with your health care provider. Document Released: 01/29/2005 Document Revised: 06/08/2016 Document Reviewed: 06/08/2016 Elsevier Interactive  Patient Education  2018 [place] for Eating Away From Home If You Have Diabetes Controlling your  level of blood glucose, also known as blood sugar, can be challenging. It can be even more difficult when you do not prepare your own meals. The following tips can help you manage your diabetes when you eat away from home. Planning ahead Plan ahead if you know you will be eating away from home:  Ask your health care provider how to time meals and medicine if you are taking insulin.  Make a list of restaurants near you that offer healthy choices. If they have a carry-out menu, take it home and plan what you will order ahead of time.  Look up the restaurant you want to eat at online. Many chain and fast-food restaurants list nutritional information online. Use this information to choose the healthiest options and to calculate how many carbohydrates will be in your meal.  Use a carbohydrate-counting book or mobile app to look up the carbohydrate content and serving size of the foods you want to eat.  Become familiar with serving sizes and learn to recognize how many servings are in a portion. This will allow you to estimate how many carbohydrates you can eat.  Free foods A "free food" is any food or drink that has less than 5 g of carbohydrates per serving. Free foods include:  Many vegetables.  Hard boiled eggs.  Nuts or seeds.  Olives.  Cheeses.  Meats.  These types of foods make good appetizer choices and are often available at salad bars. Lemon juice, vinegar, or a low-calorie salad dressing of fewer than 20 calories per serving can be used as a "free" salad dressing. Choices to reduce carbohydrates  Substitute nonfat sweetened yogurt with a sugar-free yogurt. Yogurt made from soy milk may also be used, but you will still want a sugar-free or plain option to choose a lower carbohydrate amount.  Ask your server to take away the bread basket or chips from your table.  Order fresh fruit. A salad bar often offers fresh fruit choices. Avoid canned fruit because it is usually packed in  sugar or syrup.  Order a salad, and eat it without dressing. Or, create a "free" salad dressing.  Ask for substitutions. For example, instead of [place] fries, request an order of a vegetable such as salad, green beans, or broccoli. Other tips  If you take insulin, take the insulin once your food arrives to your table. This will ensure your insulin and food are timed correctly.  Ask your server about the portion size before your order, and ask for a take-out box if the portion has more servings than you should have. When your food comes, leave the amount you should have on the plate, and put the rest in the take-out box.  Consider splitting an entree with someone and ordering a side salad. This information is not intended to replace advice given to you by your health care provider. Make sure you discuss any questions you have with your health care provider. Document Released: 05/04/2005 Document Revised: 10/10/2015 Document Reviewed: 08/01/2013 Elsevier Interactive Patient Education  2018 [employer].    IF you received an x-ray today, you will receive an invoice from [facility]. Please contact [facility] at [phone] with questions or concerns regarding your invoice.   IF you received labwork today, you will receive an invoice from [facility]. Please contact [facility] at [phone] with questions or concerns regarding your invoice.  Our billing staff will not be able to assist you with questions regarding bills from these companies.  You will be contacted with the lab results as soon as they are available. The fastest way to get your results is to activate your My Chart account. Instructions are located on the last page of this paperwork. If you have not heard from [place] regarding the results in 2 weeks, please contact this office.       I personally performed the services described in this documentation, which was scribed in my presence. The recorded information  has been reviewed and considered for accuracy and completeness, addended by me as needed, and agree with information above.  Signed,   [name], MD [facility] at [place].  09/17/17 1:17 PM

## 2017-09-17 ENCOUNTER — Encounter: Payer: Self-pay | Admitting: Family Medicine

## 2017-09-21 ENCOUNTER — Encounter: Payer: BLUE CROSS/BLUE SHIELD | Admitting: Gastroenterology

## 2017-09-23 ENCOUNTER — Encounter: Payer: Self-pay | Admitting: Gastroenterology

## 2017-10-21 ENCOUNTER — Encounter: Payer: Self-pay | Admitting: Gastroenterology

## 2017-10-25 ENCOUNTER — Encounter: Payer: Self-pay | Admitting: *Deleted

## 2017-10-27 ENCOUNTER — Other Ambulatory Visit: Payer: Self-pay

## 2017-10-27 ENCOUNTER — Encounter: Payer: Self-pay | Admitting: Gastroenterology

## 2017-10-27 ENCOUNTER — Ambulatory Visit (AMBULATORY_SURGERY_CENTER): Payer: BLUE CROSS/BLUE SHIELD | Admitting: Gastroenterology

## 2017-10-27 VITALS — BP 113/68 | HR 53 | Temp 97.3°F | Resp 11 | Ht 71.0 in | Wt 172.0 lb

## 2017-10-27 DIAGNOSIS — D128 Benign neoplasm of rectum: Secondary | ICD-10-CM

## 2017-10-27 DIAGNOSIS — Z1211 Encounter for screening for malignant neoplasm of colon: Secondary | ICD-10-CM | POA: Diagnosis not present

## 2017-10-27 DIAGNOSIS — K621 Rectal polyp: Secondary | ICD-10-CM | POA: Diagnosis not present

## 2017-10-27 DIAGNOSIS — D129 Benign neoplasm of anus and anal canal: Secondary | ICD-10-CM

## 2017-10-27 MED ORDER — SODIUM CHLORIDE 0.9 % IV SOLN: 500.0000 mL | Freq: Once | INTRAVENOUS | Status: DC

## 2017-10-27 NOTE — Progress Notes (Signed)
Called to room to assist during endoscopic procedure.  Patient ID and intended procedure confirmed with present staff. Received instructions for my participation in the procedure from the performing physician.  

## 2017-10-27 NOTE — Op Note (Signed)
[facility] Patient Name: Marcus Hart Procedure Date: 10/27/2017 2:27 PM MRN: [id number] Endoscopist: Mallie Mussel L. Loletha Carrow , MD Age: 52 Referring MD:  Date of Birth: [birth year] Gender: Male Account #: [id number] Procedure:                Colonoscopy Indications:              Screening for colorectal malignant neoplasm, This                            is the patient's first colonoscopy Medicines:                Monitored Anesthesia Care Procedure:                Pre-Anesthesia Assessment:                           - Prior to the procedure, a History and Physical                            was performed, and patient medications and                            allergies were reviewed. The patient's tolerance of                            previous anesthesia was also reviewed. The risks                            and benefits of the procedure and the sedation                            options and risks were discussed with the patient.                            All questions were answered, and informed consent                            was obtained. Prior Anticoagulants: The patient has                            taken no previous anticoagulant or antiplatelet                            agents. ASA Grade Assessment: II - A patient with                            mild systemic disease. After reviewing the risks                            and benefits, the patient was deemed in                            satisfactory condition to undergo the procedure.  After obtaining informed consent, the colonoscope                            was passed under direct vision. Throughout the                            procedure, the patient's blood pressure, pulse, and                            oxygen saturations were monitored continuously. The                            Model CF-HQ190L [id number]) scope was introduced                            through the anus and  advanced to the the cecum,                            identified by appendiceal orifice and ileocecal                            valve. The colonoscopy was performed without                            difficulty. The patient tolerated the procedure                            well. The quality of the bowel preparation was                            good. The ileocecal valve, appendiceal orifice, and                            rectum were photographed. The quality of the bowel                            preparation was evaluated using the BBPS [facility]                            Bowel Preparation Scale) with scores of: Right                            Colon = 2, Transverse Colon = 2 and Left Colon = 2.                            The total BBPS score equals 6. Scope In: 2:31:11 PM Scope Out: 2:47:02 PM Scope Withdrawal Time: 0 hours 11 minutes 12 seconds  Total Procedure Duration: 0 hours 15 minutes 51 seconds  Findings:                 The perianal and digital rectal examinations were                            normal.  A 3 mm polyp was found in the rectum. The polyp was                            sessile. The polyp was removed with a cold biopsy                            forceps. Resection and retrieval were complete.                           The exam was otherwise without abnormality on                            direct and retroflexion views. Complications:            No immediate complications. Estimated Blood Loss:     Estimated blood loss was minimal. Impression:               - One 3 mm polyp in the rectum, removed with a cold                            biopsy forceps. Resected and retrieved.                           - The examination was otherwise normal on direct                            and retroflexion views. Recommendation:           - Patient has a contact number available for                            emergencies. The signs and symptoms of  potential                            delayed complications were discussed with the                            patient. Return to normal activities tomorrow.                            Written discharge instructions were provided to the                            patient.                           - Resume previous diet.                           - Continue present medications.                           - Await pathology results.                           - Repeat colonoscopy is recommended for  surveillance. The colonoscopy date will be                            determined after pathology results from today's                            exam become available for review. Henry L. Loletha Carrow, MD 10/27/2017 2:53:24 PM This report has been signed electronically.

## 2017-10-27 NOTE — Progress Notes (Signed)
Report given to PACU, vss 

## 2017-10-27 NOTE — Patient Instructions (Signed)
YOU HAD AN ENDOSCOPIC PROCEDURE TODAY AT THE [facility]:   Refer to the procedure report that was given to you for any specific questions about what was found during the examination.  If the procedure report does not answer your questions, please call your gastroenterologist to clarify.  If you requested that your care partner not be given the details of your procedure findings, then the procedure report has been included in a sealed envelope for you to review at your convenience later.  YOU SHOULD EXPECT: Some feelings of bloating in the abdomen. Passage of more gas than usual.  Walking can help get rid of the air that was put into your GI tract during the procedure and reduce the bloating. If you had a lower endoscopy (such as a colonoscopy or flexible sigmoidoscopy) you may notice spotting of blood in your stool or on the toilet paper. If you underwent a bowel prep for your procedure, you may not have a normal bowel movement for a few days.  Please Note:  You might notice some irritation and congestion in your nose or some drainage.  This is from the oxygen used during your procedure.  There is no need for concern and it should clear up in a day or so.  SYMPTOMS TO REPORT IMMEDIATELY:   Following lower endoscopy (colonoscopy or flexible sigmoidoscopy):  Excessive amounts of blood in the stool  Significant tenderness or worsening of abdominal pains  Swelling of the abdomen that is new, acute  Fever of 100F or higher   Please see handout given to you on Polyps.  For urgent or emergent issues, a gastroenterologist can be reached at any hour by calling [phone].   DIET:  We do recommend a small meal at first, but then you may proceed to your regular diet.  Drink plenty of fluids but you should avoid alcoholic beverages for 24 hours.  ACTIVITY:  You should plan to take it easy for the rest of today and you should NOT DRIVE or use heavy machinery until tomorrow (because of  the sedation medicines used during the test).    FOLLOW UP: Our staff will call the number listed on your records the next business day following your procedure to check on you and address any questions or concerns that you may have regarding the information given to you following your procedure. If we do not reach you, we will leave a message.  However, if you are feeling well and you are not experiencing any problems, there is no need to return our call.  We will assume that you have returned to your regular daily activities without incident.  If any biopsies were taken you will be contacted by phone or by letter within the next 1-3 weeks.  Please call us at [phone] if you have not heard about the biopsies in 3 weeks.    SIGNATURES/CONFIDENTIALITY: You and/or your care partner have signed paperwork which will be entered into your electronic medical record.  These signatures attest to the fact that that the information above on your After Visit Summary has been reviewed and is understood.  Full responsibility of the confidentiality of this discharge information lies with you and/or your care-partner.  Thank you for letting us take care of your healthcare needs today. 

## 2017-10-28 ENCOUNTER — Telehealth: Payer: Self-pay

## 2017-10-28 ENCOUNTER — Telehealth: Payer: Self-pay | Admitting: *Deleted

## 2017-10-28 NOTE — Telephone Encounter (Signed)
  Follow up Call-  Call back number 10/27/2017  Post procedure Call Back phone  # [phone]  Permission to leave phone message Yes  Some recent data might be hidden     Patient questions:  Do you have a fever, pain , or abdominal swelling? No. Pain Score  0 *  Have you tolerated food without any problems? Yes.    Have you been able to return to your normal activities? Yes.    Do you have any questions about your discharge instructions: Diet   No. Medications  No. Follow up visit  No.  Do you have questions or concerns about your Care? No.  Actions: * If pain score is 4 or above: No action needed, pain <4.

## 2017-10-28 NOTE — Telephone Encounter (Signed)
No answer. Number identifier. Unable to leave message, mailbox full.

## 2017-11-03 ENCOUNTER — Encounter: Payer: Self-pay | Admitting: Gastroenterology

## 2017-12-16 ENCOUNTER — Ambulatory Visit: Payer: BLUE CROSS/BLUE SHIELD | Admitting: Family Medicine

## 2017-12-23 ENCOUNTER — Ambulatory Visit: Payer: BLUE CROSS/BLUE SHIELD | Admitting: Family Medicine

## 2018-01-13 ENCOUNTER — Ambulatory Visit: Payer: BLUE CROSS/BLUE SHIELD | Admitting: Family Medicine

## 2018-01-14 ENCOUNTER — Other Ambulatory Visit: Payer: Self-pay

## 2018-01-14 ENCOUNTER — Ambulatory Visit (INDEPENDENT_AMBULATORY_CARE_PROVIDER_SITE_OTHER): Payer: BLUE CROSS/BLUE SHIELD | Admitting: Family Medicine

## 2018-01-14 ENCOUNTER — Encounter: Payer: Self-pay | Admitting: Family Medicine

## 2018-01-14 VITALS — BP 124/72 | HR 77 | Temp 98.2°F | Resp 16 | Ht 71.0 in | Wt 173.6 lb

## 2018-01-14 DIAGNOSIS — B078 Other viral warts: Secondary | ICD-10-CM

## 2018-01-14 DIAGNOSIS — D224 Melanocytic nevi of scalp and neck: Secondary | ICD-10-CM | POA: Diagnosis not present

## 2018-01-14 DIAGNOSIS — E119 Type 2 diabetes mellitus without complications: Secondary | ICD-10-CM | POA: Diagnosis not present

## 2018-01-14 DIAGNOSIS — E785 Hyperlipidemia, unspecified: Secondary | ICD-10-CM | POA: Diagnosis not present

## 2018-01-14 MED ORDER — METFORMIN HCL 1000 MG PO TABS: ORAL_TABLET | ORAL | 1 refills | Status: DC

## 2018-01-14 MED ORDER — ROSUVASTATIN CALCIUM 10 MG PO TABS: 10.0000 mg | ORAL_TABLET | Freq: Every day | ORAL | 1 refills | Status: DC

## 2018-01-14 MED ORDER — SAXAGLIPTIN HCL 5 MG PO TABS: 5.0000 mg | ORAL_TABLET | Freq: Every day | ORAL | 1 refills | Status: DC

## 2018-01-14 NOTE — Patient Instructions (Addendum)
I will refer you to dermatology for the area on top of the scalp.  The bumps on the hand appear to be common warts, so freezing treatment today should take care of that issue.  Watch for blistering and keep those areas clean and covered as well when healing. See other information below.  I will check your blood work to determine if other changes needed in your diabetes or cholesterol medication.  Follow-up in 3 months. Thank you for coming in today   Warts Warts are small growths on the skin. They are common and can occur on various areas of the body. A person may have one wart or multiple warts. Most warts are not painful, and they usually do not cause problems. However, warts can cause pain if they are large or occur in an area of the body where pressure will be applied to them, such as the bottom of the foot. In many cases, warts do not require treatment. They usually go away on their own over a period of many months to a couple years. Various treatments may be done for warts that cause problems or do not go away. Sometimes, warts go away and then come back again. What are the causes? Warts are caused by a type of virus that is called human papillomavirus (HPV). This virus can spread from person to person through direct contact. Warts can also spread to other areas of the body when a person scratches a wart and then scratches another area of his or her body. What increases the risk? Warts are more likely to develop in:  People who are 65-82 years of age.  People who have a weakened body defense system (immune system).  What are the signs or symptoms? A wart may be round or oval or have an irregular shape. Most warts have a rough surface. Warts may range in color from skin color to light yellow, brown, or gray. They are generally less than  inch (1.3 cm) in size. Most warts are painless, but some can be painful when pressure is applied to them. How is this diagnosed? A wart can usually be  diagnosed from its appearance. In some cases, a tissue sample may be removed (biopsy) to be looked at under a microscope. How is this treated? In many cases, warts do not need treatment. If treatment is needed, options may include:  Applying medicated solutions, creams, or patches to the wart. These may be over-the-counter or prescription medicines that make the skin soft so that layers will gradually shed away. In many cases, the medicine is applied one or two times per day and covered with a bandage.  Putting duct tape over the top of the wart (occlusion). You will leave the tape in place for as long as told by your health care provider, then you will replace it with a new strip of tape. This is done until the wart goes away.  Freezing the wart with liquid nitrogen (cryotherapy).  Burning the wart with: ? Laser treatment. ? An electrified probe (electrocautery).  Injection of a medicine (Candida antigen) into the wart to help the body's immune system to fight off the wart.  Surgery to remove the wart.  Follow these instructions at home:  Apply over-the-counter and prescription medicines only as told by your health care provider.  Do not apply over-the-counter wart medicines to your face or genitals before you ask your health care provider if it is okay to do so.  Do not scratch  or pick at a wart.  Wash your hands after you touch a wart.  Avoid shaving hair that is over a wart.  Keep all follow-up visits as told by your health care provider. This is important. Contact a health care provider if:  Your warts do not improve after treatment.  You have redness, swelling, or pain at the site of a wart.  You have bleeding from a wart that does not stop with light pressure.  You have diabetes and you develop a wart. This information is not intended to replace advice given to you by your health care provider. Make sure you discuss any questions you have with your health care  provider. Document Released: 02/11/2005 Document Revised: 10/16/2015 Document Reviewed: 07/30/2014 Elsevier Interactive Patient Education  [employer].   If you have lab work done today you will be contacted with your lab results within the next 2 weeks.  If you have not heard from [place] then please contact us. The fastest way to get your results is to register for My Chart.   IF you received an x-ray today, you will receive an invoice from [facility]. Please contact [facility] at [phone] with questions or concerns regarding your invoice.   IF you received labwork today, you will receive an invoice from [facility]. Please contact [facility] at [phone] with questions or concerns regarding your invoice.   Our billing staff will not be able to assist you with questions regarding bills from these companies.  You will be contacted with the lab results as soon as they are available. The fastest way to get your results is to activate your My Chart account. Instructions are located on the last page of this paperwork. If you have not heard from [place] regarding the results in 2 weeks, please contact this office.

## 2018-01-14 NOTE — Progress Notes (Signed)
Subjective:  By signing my name below, I, [name], attest that this documentation has been prepared under the direction and in the presence of [name], MD Electronically Signed: Ladene Artist, ED Scribe 01/14/2018 at 3:12 PM.   Patient ID: Marcus Hart, male    DOB: 1965-11-17, 52 y.o.   MRN: [id number]  Chief Complaint  Patient presents with  . Diabetes    3 month follow-up    HPI Marcus Hart is a 51 y.o. male who presents to Primary Care at [facility] for f/u. Last seen 5/2. Pt is fasting at this visit; last ate around 8 AM today.  DM Lab Results  Component Value Date   HGBA1C 7.1 09/16/2017  Continued 1000 mg bid, Onglyza 5 mg qd. Foot and urine in Jan. Optho: 5/2. Pneumovax: 06/18/17. - Pt reports blood glucose readings averaging 120s. He has been exercising. Denies symptomatic lows or any side-effects.  Hyperlipidemia Lab Results  Component Value Date   CHOL 261 (H) 05/28/2017   HDL 75 05/28/2017   LDLCALC 156 (H) 05/28/2017   TRIG 149 05/28/2017   CHOLHDL 3.5 05/28/2017   Lab Results  Component Value Date   ALT 16 05/28/2017   AST 20 05/28/2017   ALKPHOS 51 05/28/2017   BILITOT <0.2 05/28/2017  Crestor 10 mg qd. Planned for fasting lab visit 6 wks after last appointment as he has just started crestor, has not had performed yet. - Pt denies myalgias or any side-effects.  Skin Pt also reports warts on his R index finger and R thumb x 6 months. He tried OTC freezing spray which improved the area on his R thumb. Also reports a lesion in his scalp first noticed last yr.  Health Maintenance Colonoscopy on 10/27/17 by Dr. Loletha Carrow. 1 inflammatory polyp, rpt in 10 yrs.  Patient Active Problem List   Diagnosis Date Noted  . DM2 (diabetes mellitus, type 2) ([place]) 02/13/2013  . Other and unspecified hyperlipidemia 02/13/2013   Past Medical History:  Diagnosis Date  . Diabetes mellitus ([place])   . Hyperlipidemia    Past Surgical History:  Procedure Laterality  Date  . NO PAST SURGERIES     Allergies  Allergen Reactions  . Cinnamon Diarrhea   Prior to Admission medications   Medication Sig Start Date End Date Taking? Authorizing Provider  aspirin 81 MG tablet Take 81 mg by mouth daily.   Yes [provider]  metFORMIN (GLUCOPHAGE) 1000 MG tablet TAKE 1 TABLET BY MOUTH TWICE DAILY WITH A MEAL 09/16/17  Yes [name], MD  rosuvastatin (CRESTOR) 10 MG tablet Take 1 tablet (10 mg total) by mouth daily. 09/16/17  Yes [name], MD  saxagliptin HCl (ONGLYZA) 5 MG TABS tablet Take 1 tablet (5 mg total) by mouth daily. 09/16/17  Yes [name], MD   Social History   Socioeconomic History  . Marital status: Married    Spouse name: [name]  . Number of children: 4  . Years of education: Not on file  . Highest education level: Not on file  Occupational History  . Occupation: Mining engineer  Social Needs  . Financial resource strain: Not on file  . Food insecurity:    Worry: Not on file    Inability: Not on file  . Transportation needs:    Medical: Not on file    Non-medical: Not on file  Tobacco Use  . Smoking status: Never Smoker  . Smokeless tobacco: Never Used  Substance and Sexual Activity  .  Alcohol use: Never    Alcohol/week: 0.0 standard drinks    Frequency: Never  . Drug use: No  . Sexual activity: Yes  Lifestyle  . Physical activity:    Days per week: Not on file    Minutes per session: Not on file  . Stress: Not on file  Relationships  . Social connections:    Talks on phone: Not on file    Gets together: Not on file    Attends religious service: Not on file    Active member of club or organization: Not on file    Attends meetings of clubs or organizations: Not on file    Relationship status: Not on file  . Intimate partner violence:    Fear of current or ex partner: Not on file    Emotionally abused: Not on file    Physically abused: Not on file    Forced sexual activity: Not on file  Other Topics  Concern  . Not on file  Social History Narrative   Married   Education: College   Exercise: No   Review of Systems  Constitutional: Negative for fatigue and unexpected weight change.  Eyes: Negative for visual disturbance.  Respiratory: Negative for cough, chest tightness and shortness of breath.   Cardiovascular: Negative for chest pain, palpitations and leg swelling.  Gastrointestinal: Negative for abdominal pain, blood in stool and diarrhea.  Skin:       + Warts to R hand and Bump in scalp  Neurological: Negative for dizziness, light-headedness and headaches.      Objective:   Physical Exam  Constitutional: He is oriented to person, place, and time. He appears well-developed and well-nourished.  HENT:  Head: Normocephalic and atraumatic.  Scalp: 5 mm hyperpigmented slightly raised lesion on dorsal scalp. No surrounding inflammation apparent but min surrounding hyperpigmentation.  Eyes: Pupils are equal, round, and reactive to light. EOM are normal.  Neck: No JVD present. Carotid bruit is not present.  Cardiovascular: Normal rate, regular rhythm and normal heart sounds.  No murmur heard. Pulmonary/Chest: Effort normal and breath sounds normal. He has no rales.  Musculoskeletal: He exhibits no edema.  Neurological: He is alert and oriented to person, place, and time.  Skin: Skin is warm and dry.  R index finger: 2 mm common wart on volar proximal phalanx. Similar 2 mm lesion with small projections over radial distal thumb.  Psychiatric: He has a normal mood and affect.  Vitals reviewed.  Vitals:   01/14/18 1454  BP: 124/72  Pulse: 77  Resp: 16  Temp: 98.2 F (36.8 C)  TempSrc: Oral  SpO2: 99%  Weight: 173 lb 9.6 oz (78.7 kg)  Height: 5\' 11"  (1.803 m)      Assessment & Plan:  Marcus Hart is a 52 y.o. male Type 2 diabetes mellitus without complication, without long-term current use of insulin (HCC) - Plan: Hemoglobin A1c, metFORMIN (GLUCOPHAGE) 1000 MG tablet,  saxagliptin HCl (ONGLYZA) 5 MG TABS tablet  -Tolerating current regimen, continue Onglyza, metformin same doses, A1c pending.  Hyperlipidemia, unspecified hyperlipidemia type - Plan: Lipid panel, Comprehensive metabolic panel, rosuvastatin (CRESTOR) 10 MG tablet  -Tolerating Crestor, check fasting labs today.  No change in dose  Nevus of scalp - Plan: Ambulatory referral to Dermatology  -Hyperpigmented nevus as above, possible slight waxy appearance.  Will refer to dermatology to discuss excision.  Common wart  -Risks, benefits, alternatives discussed for cryodestruction with liquid nitrogen.  Consent obtained.  Freeze/thaw cycle x3 to each lesion.  Aftercare discussed.   Meds ordered this encounter  Medications  . metFORMIN (GLUCOPHAGE) 1000 MG tablet    Sig: TAKE 1 TABLET BY MOUTH TWICE DAILY WITH A MEAL    Dispense:  180 tablet    Refill:  1  . rosuvastatin (CRESTOR) 10 MG tablet    Sig: Take 1 tablet (10 mg total) by mouth daily.    Dispense:  90 tablet    Refill:  1  . saxagliptin HCl (ONGLYZA) 5 MG TABS tablet    Sig: Take 1 tablet (5 mg total) by mouth daily.    Dispense:  90 tablet    Refill:  1   Patient Instructions     I will refer you to dermatology for the area on top of the scalp.  The bumps on the hand appear to be common warts, so freezing treatment today should take care of that issue.  Watch for blistering and keep those areas clean and covered as well when healing. See other information below.  I will check your blood work to determine if other changes needed in your diabetes or cholesterol medication.  Follow-up in 3 months. Thank you for coming in today   Warts Warts are small growths on the skin. They are common and can occur on various areas of the body. A person may have one wart or multiple warts. Most warts are not painful, and they usually do not cause problems. However, warts can cause pain if they are large or occur in an area of the body where  pressure will be applied to them, such as the bottom of the foot. In many cases, warts do not require treatment. They usually go away on their own over a period of many months to a couple years. Various treatments may be done for warts that cause problems or do not go away. Sometimes, warts go away and then come back again. What are the causes? Warts are caused by a type of virus that is called human papillomavirus (HPV). This virus can spread from person to person through direct contact. Warts can also spread to other areas of the body when a person scratches a wart and then scratches another area of his or her body. What increases the risk? Warts are more likely to develop in:  People who are 37-21 years of age.  People who have a weakened body defense system (immune system).  What are the signs or symptoms? A wart may be round or oval or have an irregular shape. Most warts have a rough surface. Warts may range in color from skin color to light yellow, brown, or gray. They are generally less than  inch (1.3 cm) in size. Most warts are painless, but some can be painful when pressure is applied to them. How is this diagnosed? A wart can usually be diagnosed from its appearance. In some cases, a tissue sample may be removed (biopsy) to be looked at under a microscope. How is this treated? In many cases, warts do not need treatment. If treatment is needed, options may include:  Applying medicated solutions, creams, or patches to the wart. These may be over-the-counter or prescription medicines that make the skin soft so that layers will gradually shed away. In many cases, the medicine is applied one or two times per day and covered with a bandage.  Putting duct tape over the top of the wart (occlusion). You will leave the tape in place for as long as told by your health  care provider, then you will replace it with a new strip of tape. This is done until the wart goes away.  Freezing the wart  with liquid nitrogen (cryotherapy).  Burning the wart with: ? Laser treatment. ? An electrified probe (electrocautery).  Injection of a medicine (Candida antigen) into the wart to help the body's immune system to fight off the wart.  Surgery to remove the wart.  Follow these instructions at home:  Apply over-the-counter and prescription medicines only as told by your health care provider.  Do not apply over-the-counter wart medicines to your face or genitals before you ask your health care provider if it is okay to do so.  Do not scratch or pick at a wart.  Wash your hands after you touch a wart.  Avoid shaving hair that is over a wart.  Keep all follow-up visits as told by your health care provider. This is important. Contact a health care provider if:  Your warts do not improve after treatment.  You have redness, swelling, or pain at the site of a wart.  You have bleeding from a wart that does not stop with light pressure.  You have diabetes and you develop a wart. This information is not intended to replace advice given to you by your health care provider. Make sure you discuss any questions you have with your health care provider. Document Released: 02/11/2005 Document Revised: 10/16/2015 Document Reviewed: 07/30/2014 Elsevier Interactive Patient Education  [employer].   If you have lab work done today you will be contacted with your lab results within the next 2 weeks.  If you have not heard from [place] then please contact us. The fastest way to get your results is to register for My Chart.   IF you received an x-ray today, you will receive an invoice from [facility]. Please contact [facility] at [phone] with questions or concerns regarding your invoice.   IF you received labwork today, you will receive an invoice from [place]. Please contact [facility] at [phone] with questions or concerns regarding your invoice.   Our billing staff  will not be able to assist you with questions regarding bills from these companies.  You will be contacted with the lab results as soon as they are available. The fastest way to get your results is to activate your My Chart account. Instructions are located on the last page of this paperwork. If you have not heard from [place] regarding the results in 2 weeks, please contact this office.       I personally performed the services described in this documentation, which was scribed in my presence. The recorded information has been reviewed and considered for accuracy and completeness, addended by me as needed, and agree with information above.  Signed,   [name], MD [facility].  01/14/18 3:33 PM

## 2018-01-15 LAB — COMPREHENSIVE METABOLIC PANEL
A/G RATIO: 1.8 (ref 1.2–2.2)
ALT: 18 IU/L (ref 0–44)
AST: 20 IU/L (ref 0–40)
Albumin: 4.7 g/dL (ref 3.5–5.5)
Alkaline Phosphatase: 53 IU/L (ref 39–117)
BUN / CREAT RATIO: 19 (ref 9–20)
BUN: 15 mg/dL (ref 6–24)
Bilirubin Total: 0.3 mg/dL (ref 0.0–1.2)
CALCIUM: 9.9 mg/dL (ref 8.7–10.2)
CHLORIDE: 101 mmol/L (ref 96–106)
CO2: 23 mmol/L (ref 20–29)
Creatinine, Ser: 0.8 mg/dL (ref 0.76–1.27)
GFR calc non Af Amer: 103 mL/min/{1.73_m2} (ref >59)
GFR, EST AFRICAN AMERICAN: 120 mL/min/{1.73_m2} (ref >59)
GLOBULIN, TOTAL: 2.6 g/dL (ref 1.5–4.5)
Glucose: 104 mg/dL — ABNORMAL HIGH (ref 65–99)
POTASSIUM: 4.3 mmol/L (ref 3.5–5.2)
SODIUM: 141 mmol/L (ref 134–144)
TOTAL PROTEIN: 7.3 g/dL (ref 6.0–8.5)

## 2018-01-15 LAB — LIPID PANEL
CHOLESTEROL TOTAL: 186 mg/dL (ref 100–199)
Chol/HDL Ratio: 2.8 ratio (ref 0.0–5.0)
HDL: 66 mg/dL (ref >39)
LDL Calculated: 88 mg/dL (ref 0–99)
TRIGLYCERIDES: 159 mg/dL — AB (ref 0–149)
VLDL Cholesterol Cal: 32 mg/dL (ref 5–40)

## 2018-01-15 LAB — HEMOGLOBIN A1C
Est. average glucose Bld gHb Est-mCnc: 174 mg/dL
Hgb A1c MFr Bld: 7.7 % — ABNORMAL HIGH (ref 4.8–5.6)

## 2018-02-22 ENCOUNTER — Telehealth: Payer: Self-pay | Admitting: Family Medicine

## 2018-02-22 NOTE — Telephone Encounter (Signed)
Called pt. To reschedule appt. With Dr. Carlota Raspberry on 04/07/18. Left VM to call back. If pt. Calls back please attempt to reschedule with [place] on the same day.

## 2018-04-07 ENCOUNTER — Ambulatory Visit: Payer: BLUE CROSS/BLUE SHIELD | Admitting: Family Medicine

## 2018-06-20 ENCOUNTER — Other Ambulatory Visit: Payer: Self-pay

## 2018-06-20 ENCOUNTER — Ambulatory Visit (HOSPITAL_COMMUNITY)
Admission: EM | Admit: 2018-06-20 | Discharge: 2018-06-20 | Disposition: A | Discharge status: Home / Self Care | Payer: BLUE CROSS/BLUE SHIELD | Attending: Urgent Care | Admitting: Urgent Care

## 2018-06-20 ENCOUNTER — Encounter (HOSPITAL_COMMUNITY): Payer: Self-pay

## 2018-06-20 DIAGNOSIS — R059 Cough, unspecified: Secondary | ICD-10-CM

## 2018-06-20 DIAGNOSIS — R52 Pain, unspecified: Secondary | ICD-10-CM

## 2018-06-20 DIAGNOSIS — R05 Cough: Secondary | ICD-10-CM

## 2018-06-20 DIAGNOSIS — J111 Influenza due to unidentified influenza virus with other respiratory manifestations: Secondary | ICD-10-CM

## 2018-06-20 MED ORDER — OSELTAMIVIR PHOSPHATE 75 MG PO CAPS: 75.0000 mg | ORAL_CAPSULE | Freq: Two times a day (BID) | ORAL | 0 refills | Status: DC

## 2018-06-20 MED ORDER — BENZONATATE 100 MG PO CAPS: 100.0000 mg | ORAL_CAPSULE | Freq: Three times a day (TID) | ORAL | 0 refills | Status: DC | PRN

## 2018-06-20 NOTE — ED Provider Notes (Signed)
MRN: [id number] DOB: 1965-09-13  Subjective:   Marcus Hart is a 53 y.o. male presenting for 1 day history of moderate to severe malaise, body aches.  Patient reports that he is worsening and has constant body aches.  He used Advil last night.  Has had multiple flu contacts.  Has a history of diabetes.  Denies history of kidney disease.   Current Facility-Administered Medications:  .  0.9 %  sodium chloride infusion, 500 mL, Intravenous, Once, Danis, Estill Cotta III, MD  Current Outpatient Medications:  .  aspirin 81 MG tablet, Take 81 mg by mouth daily., Disp: , Rfl:  .  metFORMIN (GLUCOPHAGE) 1000 MG tablet, TAKE 1 TABLET BY MOUTH TWICE DAILY WITH A MEAL, Disp: 180 tablet, Rfl: 1 .  rosuvastatin (CRESTOR) 10 MG tablet, Take 1 tablet (10 mg total) by mouth daily., Disp: 90 tablet, Rfl: 1 .  saxagliptin HCl (ONGLYZA) 5 MG TABS tablet, Take 1 tablet (5 mg total) by mouth daily., Disp: 90 tablet, Rfl: 1   Allergies  Allergen Reactions  . Cinnamon Diarrhea    Past Medical History:  Diagnosis Date  . Diabetes mellitus ([place])   . Hyperlipidemia      Past Surgical History:  Procedure Laterality Date  . NO PAST SURGERIES      Review of Systems  Constitutional: Positive for fever and malaise/fatigue.  HENT: Negative for ear pain and sore throat.   Eyes: Negative for blurred vision and double vision.  Respiratory: Positive for cough (dry). Negative for shortness of breath and wheezing.   Cardiovascular: Negative for chest pain.  Gastrointestinal: Positive for vomiting. Negative for abdominal pain, constipation, diarrhea and nausea.  Genitourinary: Negative for dysuria and hematuria.  Musculoskeletal: Positive for myalgias.  Skin: Negative for rash.  Neurological: Positive for headaches. Negative for dizziness.  Psychiatric/Behavioral: Negative for depression and substance abuse. The patient is not nervous/anxious.     Objective:   Vitals: BP 138/79 (BP Location: Right Arm)    Pulse 65   Temp 98.7 F (37.1 C) (Oral)   Resp 18   Wt 170 lb (77.1 kg)   SpO2 100%   BMI 23.71 kg/m   Physical Exam Constitutional:      General: He is not in acute distress.    Appearance: Normal appearance. He is well-developed. He is not ill-appearing, toxic-appearing or diaphoretic.  HENT:     Head: Normocephalic and atraumatic.     Right Ear: Tympanic membrane and external ear normal.     Left Ear: Tympanic membrane and external ear normal.     Nose: Nose normal. No congestion or rhinorrhea.     Mouth/Throat:     Mouth: Mucous membranes are moist.     Pharynx: Oropharynx is clear. No oropharyngeal exudate or posterior oropharyngeal erythema.  Eyes:     General: No scleral icterus.       Right eye: No discharge.        Left eye: No discharge.     Extraocular Movements: Extraocular movements intact.     Pupils: Pupils are equal, round, and reactive to light.  Neck:     Musculoskeletal: Normal range of motion and neck supple. No neck rigidity or muscular tenderness.  Cardiovascular:     Rate and Rhythm: Normal rate and regular rhythm.     Heart sounds: Normal heart sounds. No murmur. No friction rub. No gallop.   Pulmonary:     Effort: Pulmonary effort is normal. No respiratory distress.     Breath  sounds: Normal breath sounds. No stridor. No wheezing, rhonchi or rales.  Lymphadenopathy:     Cervical: No cervical adenopathy.  Skin:    General: Skin is warm and dry.  Neurological:     Mental Status: He is alert and oriented to person, place, and time.  Psychiatric:        Mood and Affect: Mood normal.        Behavior: Behavior normal.        Thought Content: Thought content normal.     Assessment and Plan :   Influenza  Cough  Body aches  Will cover for influenza with Tamiflu given symptom set, acute onset, physical exam findings, flu exposure.  Use supportive care, rest, fluids, hydration, light meals, schedule Tylenol and ibuprofen. Counseled patient on  potential for adverse effects with medications prescribed today, patient verbalized understanding. ER and return-to-clinic precautions discussed, patient verbalized understanding.   Jaynee Eagles, [place] 06/20/18 [id number]

## 2018-06-20 NOTE — Discharge Instructions (Signed)
We will manage this as a viral syndrome. For sore throat or cough try using a honey-based tea. Use 3 teaspoons of honey with juice squeezed from half lemon. Place shaved pieces of ginger into 1/2-1 cup of water and warm over stove top. Then mix the ingredients and repeat every 4 hours as needed. Please take ibuprofen 400mg  every 6 hours alternating with OR taken together with Tylenol 500mg  every 6 hours. Hydrate very well with at least 2 liters of water. Eat light meals such as soups to replenish electrolytes and soft fruits, veggies. Start an antihistamine like Zyrtec, Allegra or Claritin.

## 2018-06-20 NOTE — ED Triage Notes (Signed)
Pt cc he has been exposed to the flu at home. Pt cc headaches, body aches, fever and chills. Pt states he has used advil  for his fever.

## 2018-09-16 ENCOUNTER — Other Ambulatory Visit: Payer: Self-pay | Admitting: Family Medicine

## 2018-09-16 DIAGNOSIS — E785 Hyperlipidemia, unspecified: Secondary | ICD-10-CM

## 2018-09-16 DIAGNOSIS — E119 Type 2 diabetes mellitus without complications: Secondary | ICD-10-CM

## 2018-12-24 ENCOUNTER — Other Ambulatory Visit: Payer: Self-pay | Admitting: Family Medicine

## 2018-12-24 DIAGNOSIS — E119 Type 2 diabetes mellitus without complications: Secondary | ICD-10-CM

## 2018-12-24 DIAGNOSIS — E785 Hyperlipidemia, unspecified: Secondary | ICD-10-CM

## 2018-12-24 NOTE — Telephone Encounter (Signed)
Forwarding medication refill request to clinical pool for review. 

## 2018-12-29 ENCOUNTER — Telehealth: Payer: Self-pay | Admitting: Family Medicine

## 2018-12-29 ENCOUNTER — Other Ambulatory Visit: Payer: Self-pay | Admitting: Family Medicine

## 2018-12-29 DIAGNOSIS — E119 Type 2 diabetes mellitus without complications: Secondary | ICD-10-CM

## 2018-12-29 DIAGNOSIS — E785 Hyperlipidemia, unspecified: Secondary | ICD-10-CM

## 2018-12-29 NOTE — Telephone Encounter (Signed)
Pt has appt 8/19 but requested a one week supply on his diabetic medication

## 2019-01-03 NOTE — Telephone Encounter (Signed)
Will be evaluated at appointment tomorrow.

## 2019-01-04 ENCOUNTER — Ambulatory Visit: Payer: BLUE CROSS/BLUE SHIELD | Admitting: Family Medicine

## 2019-02-07 LAB — HM DIABETES EYE EXAM

## 2019-03-03 ENCOUNTER — Encounter: Payer: Self-pay | Admitting: Family Medicine

## 2019-03-31 ENCOUNTER — Other Ambulatory Visit: Payer: Self-pay | Admitting: Family Medicine

## 2019-03-31 DIAGNOSIS — E119 Type 2 diabetes mellitus without complications: Secondary | ICD-10-CM

## 2019-03-31 DIAGNOSIS — E785 Hyperlipidemia, unspecified: Secondary | ICD-10-CM

## 2019-03-31 NOTE — Telephone Encounter (Signed)
Forwarding medication refill requests to the clinical pool for review. 

## 2019-06-14 ENCOUNTER — Other Ambulatory Visit: Payer: Self-pay

## 2019-06-14 ENCOUNTER — Ambulatory Visit (INDEPENDENT_AMBULATORY_CARE_PROVIDER_SITE_OTHER): Payer: 59

## 2019-06-14 ENCOUNTER — Ambulatory Visit: Payer: 59 | Admitting: Family Medicine

## 2019-06-14 VITALS — BP 127/73 | HR 66 | Temp 98.3°F | Ht 71.0 in | Wt 175.0 lb

## 2019-06-14 DIAGNOSIS — S6992XA Unspecified injury of left wrist, hand and finger(s), initial encounter: Secondary | ICD-10-CM | POA: Diagnosis not present

## 2019-06-14 DIAGNOSIS — E785 Hyperlipidemia, unspecified: Secondary | ICD-10-CM

## 2019-06-14 DIAGNOSIS — L821 Other seborrheic keratosis: Secondary | ICD-10-CM | POA: Diagnosis not present

## 2019-06-14 DIAGNOSIS — B079 Viral wart, unspecified: Secondary | ICD-10-CM

## 2019-06-14 DIAGNOSIS — E119 Type 2 diabetes mellitus without complications: Secondary | ICD-10-CM

## 2019-06-14 MED ORDER — SAXAGLIPTIN HCL 5 MG PO TABS: 5.0000 mg | ORAL_TABLET | Freq: Every day | ORAL | 1 refills | Status: DC

## 2019-06-14 MED ORDER — METFORMIN HCL 1000 MG PO TABS: ORAL_TABLET | ORAL | 1 refills | Status: DC

## 2019-06-14 MED ORDER — ROSUVASTATIN CALCIUM 10 MG PO TABS: 10.0000 mg | ORAL_TABLET | Freq: Every day | ORAL | 1 refills | Status: DC

## 2019-06-14 NOTE — Progress Notes (Signed)
Subjective:  Patient ID: Marcus Hart, male    DOB: 02-Jan-1966  Age: 54 y.o. MRN: [id number]  CC:  Chief Complaint  Patient presents with  . Follow-up    on pt's Diabetes. pt hasn't had any episodes of high/low blood sugar. pt states his medication is working well for him and that he has had no side effects from his current medication.  . wart on finger    pt has a wart on his L index finger.no pain or itching. pt states he would like it removed.    HPI Marcus Hart presents for  Diabetes: Without known complication.  Last visit with me in 2019.  Appears he was evaluated December 7 at other practice, due to insurance change last year.  Labs reviewed from 12/7 - glucose 142, CMP normal o/w, A1c 8.0, TC174, trig 70, HDL 63, nonHDL 111.  Had been off meds for few weeks at that time. Taking meds since 12/7. Currently taking Metformin 1000 mg twice daily, Onglyza 5 mg daily. Home readings: Fasting:120-130  2hr PP:170  On crestor -no new myalgias.  On asa 81mg  QD.   Microalbumin: Overdue but normal in January 2019 Optho, foot exam, pneumovax: Up-to-date.  Lab Results  Component Value Date   HGBA1C 7.7 (H) 01/14/2018   HGBA1C 7.1 09/16/2017   HGBA1C 7.8 05/28/2017   Lab Results  Component Value Date   MICROALBUR <0.2 04/06/2015   LDLCALC 88 01/14/2018   CREATININE 0.80 01/14/2018    Right index finger wart: Dermatology note reviewed from December 15.  Previously treated wart on the right hand with cryotherapy.  Wart was noted on the right second finger with plan for cryotherapy at dermatology.  Plan for wound to heal initially, then daily application of Compound W until resolved, option of repeat cryotherapy at 1 month. Did not have cryo done. would like to have done here.   Has seborrhiec keratosis on scalp - needs referral to local derm.   L third finger pain Fell 2 month ago, fell onto hand, 3rd finger end joint sore, no swelling. No wounds. Has not been evaluated  prior to today. Able to bend finger ok, still a little sore on top.   History Patient Active Problem List   Diagnosis Date Noted  . DM2 (diabetes mellitus, type 2) ([place]) 02/13/2013  . Other and unspecified hyperlipidemia 02/13/2013   Past Medical History:  Diagnosis Date  . Diabetes mellitus ([place])   . Hyperlipidemia    Past Surgical History:  Procedure Laterality Date  . NO PAST SURGERIES     No Known Allergies Prior to Admission medications   Medication Sig Start Date End Date Taking? Authorizing Provider  aspirin 81 MG tablet Take 81 mg by mouth daily.   Yes [provider]  benzonatate (TESSALON) 100 MG capsule Take 1-2 capsules (100-200 mg total) by mouth 3 (three) times daily as needed. 06/20/18  Yes Jaynee Eagles, PA-C  metFORMIN (GLUCOPHAGE) 1000 MG tablet TAKE 1 TABLET BY MOUTH TWICE DAILY WITH A MEAL 12/30/18  Yes [name], MD  ONGLYZA 5 MG TABS tablet TAKE 1 TABLET BY MOUTH DAILY 12/30/18  Yes [name], MD  oseltamivir (TAMIFLU) 75 MG capsule Take 1 capsule (75 mg total) by mouth 2 (two) times daily. 06/20/18  Yes Jaynee Eagles, PA-C  rosuvastatin (CRESTOR) 10 MG tablet TAKE 1 TABLET BY MOUTH EVERY DAY 12/30/18  Yes [name], MD   Social History   Socioeconomic History  . Marital  status: Married    Spouse name: [name]  . Number of children: 4  . Years of education: Not on file  . Highest education level: Not on file  Occupational History  . Occupation: Mining engineer  Tobacco Use  . Smoking status: Never Smoker  . Smokeless tobacco: Never Used  Substance and Sexual Activity  . Alcohol use: Never    Alcohol/week: 0.0 standard drinks  . Drug use: No  . Sexual activity: Yes  Other Topics Concern  . Not on file  Social History Narrative   Married   Education: Secretary/administrator   Exercise: No   Social Determinants of Radio broadcast assistant Strain:   . Difficulty of Paying Living Expenses: Not on file  Food Insecurity:   . Worried About  Charity fundraiser in the Last Year: Not on file  . Ran Out of Food in the Last Year: Not on file  Transportation Needs:   . Lack of Transportation (Medical): Not on file  . Lack of Transportation (Non-Medical): Not on file  Physical Activity:   . Days of Exercise per Week: Not on file  . Minutes of Exercise per Session: Not on file  Stress:   . Feeling of Stress : Not on file  Social Connections:   . Frequency of Communication with Friends and Family: Not on file  . Frequency of Social Gatherings with Friends and Family: Not on file  . Attends Religious Services: Not on file  . Active Member of Clubs or Organizations: Not on file  . Attends Archivist Meetings: Not on file  . Marital Status: Not on file  Intimate Partner Violence:   . Fear of Current or Ex-Partner: Not on file  . Emotionally Abused: Not on file  . Physically Abused: Not on file  . Sexually Abused: Not on file    Review of Systems  Constitutional: Negative for fatigue and unexpected weight change.  Eyes: Negative for visual disturbance.  Respiratory: Negative for cough, chest tightness and shortness of breath.   Cardiovascular: Negative for chest pain, palpitations and leg swelling.  Gastrointestinal: Negative for abdominal pain and blood in stool.  Musculoskeletal: Positive for arthralgias (left 3rd DIP).  Skin:       Wart hand, lesion scalp.   Neurological: Negative for dizziness, light-headedness and headaches.     Objective:   Vitals:   06/14/19 0931  BP: 127/73  Pulse: 66  Temp: 98.3 F (36.8 C)  TempSrc: Temporal  SpO2: 100%  Weight: 175 lb (79.4 kg)  Height: 5\' 11"  (1.803 m)     Physical Exam Vitals reviewed.  Constitutional:      Appearance: He is well-developed.  HENT:     Head: Normocephalic and atraumatic.  Eyes:     Pupils: Pupils are equal, round, and reactive to light.  Neck:     Vascular: No carotid bruit or JVD.  Cardiovascular:     Rate and Rhythm: Normal rate  and regular rhythm.     Heart sounds: Normal heart sounds. No murmur.  Pulmonary:     Effort: Pulmonary effort is normal.     Breath sounds: Normal breath sounds. No rales.  Musculoskeletal:       Arms:  Skin:    General: Skin is warm and dry.     Comments: 2 small 3 to 5 mm hyperpigmented, minimally elevated lesions on dorsal scalp.  Neurological:     Mental Status: He is alert and oriented to person, place, and  time.    DG Finger Middle Left  Result Date: 06/14/2019 CLINICAL DATA:  Left long finger pain for 1 month since the patient jammed the finger. Initial encounter. EXAM: LEFT MIDDLE FINGER 2+V COMPARISON:  None. FINDINGS: There is no evidence of fracture or dislocation. There is no evidence of arthropathy or other focal bone abnormality. Soft tissues are unremarkable. IMPRESSION: Normal exam. Electronically Signed   By: Inge Rise M.D.   On: 06/14/2019 10:32     Assessment & Plan:  Marcus Hart is a 54 y.o. male . Type 2 diabetes mellitus without complication, without long-term current use of insulin ([place]) - Plan: Fructosamine  -Elevated A1c off meds in December, check fructosamine, continue same regimen for now with repeat A1c in the next 2 months  Viral wart on finger  -Risk, benefits, alternatives discussed for cryotherapy to verruca lesion on finger.  Aftercare discussed.  Consent obtained.  Liquid nitrogen freeze/thaw cycles x3.  After wound heals, can apply Compound W for few weeks until lesion has remained absent.  RTC precautions or dermatology if recurs  Jammed finger (interphalangeal joint), left, initial encounter - Plan: DG Finger Middle Left  -X-ray reassuring, likely previous jammed finger.  Symptomatic care, consider hand eval if persistent pain/limitation.  Seborrheic keratosis - Plan: Ambulatory referral to Dermatology  -Prior scalp lesion reportedly seborrheic keratosis, refer to new dermatologist at his request.  No orders of the defined types were  placed in this encounter.  Patient Instructions    I will refer you to dermatology but previous dermatologist thought the lesions on the scalp were benign seborrheic keratosis.  After wart procedure has healed, can apply Compound W once per night for a few weeks.  Follow-up with myself or dermatology if the wart returns.  Make sure to apply bandage, keep wound clean/covered as it heals from treatment today.  I will check fructosamine which is another test for diabetes, recheck in 2 months for repeat hemoglobin A1c.  Thank you for coming in today and let me know if there are questions.  No concerns on finger xray. Return to the clinic or go to the nearest emergency room if any of your symptoms worsen or new symptoms occur.   Warts  Warts are small growths on the skin. They are common and can occur on many areas of the body. A person may have one wart or several warts. In many cases, warts do not require treatment. They usually go away on their own over a period of many months to a few years. If needed, warts that cause problems or do not go away on their own can be treated. What are the causes? Warts are caused by a type of virus that is called human papillomavirus (HPV).  This virus can spread from person to person through direct contact.  Warts can also spread to other areas of the body when a person scratches a wart and then scratches another area of his or her body. What increases the risk? You are more likely to develop this condition if:  You are 64-91 years old.  You have a weakened body defense system (immune system).  You are Caucasian. What are the signs or symptoms? The main symptom of this condition is small growths on the skin. Warts may:  Be round or oval or have an irregular shape.  Have a rough surface.  Range in color from skin color to light yellow, brown, or gray.  Generally be less than  inch (1.3 cm)  in size.  Go away and then come back again. Most  warts are painless, but some can be painful if they are large or occur in an area of the body where pressure will be applied to them, such as the bottom of the foot. How is this diagnosed? A wart can usually be diagnosed based on its appearance. In some cases, a tissue sample may be removed (biopsy) to be looked at under a microscope. How is this treated? In many cases, warts do not need treatment. Sometimes treatment is desired. If treatment is needed or desired, options may include:  Applying medicated solutions, creams, or patches to the wart. These may be over-the-counter or prescription medicines that make the skin soft so that layers will gradually shed away. In many cases, the medicine is applied one or two times per day and covered with a bandage.  Putting duct tape over the top of the wart (occlusion). You will leave the tape in place for as long as told by your health care provider and then replace it with a new strip of tape. This is done until the wart goes away.  Freezing the wart with liquid nitrogen (cryotherapy).  Burning the wart with: ? Laser treatment. ? An electrified probe (electrocautery).  Injection of a medicine (Candida antigen) into the wart to help the body's immune system fight off the wart.  Surgery to remove the wart. Follow these instructions at home: Medicines  Apply over-the-counter and prescription medicines only as told by your health care provider.  Do not apply over-the-counter wart medicines to your face or genitals unless your health care provider tells you to do that. Lifestyle  Keep your immune system healthy. To do this: ? Eat a healthy, balanced diet. ? Get enough sleep. ? Do not use any products that contain nicotine or tobacco, such as cigarettes and e-cigarettes. If you need help quitting, ask your health care provider. General instructions   Wash your hands after you touch a wart.  Do not scratch or pick at a wart.  Avoid shaving  hair that is over a wart.  Keep all follow-up visits as told by your health care provider. This is important. Contact a health care provider if:  Your warts do not improve after treatment.  You have redness, swelling, or pain at the site of a wart.  You have bleeding from a wart that does not stop with light pressure.  You have diabetes and you develop a wart. Summary  Warts are small growths on the skin. They are common and can occur on many areas of the body.  In many cases, warts do not need treatment. Sometimes treatment is desired. If treatment is needed or desired, there are several treatment options.  Apply over-the-counter and prescription medicines only as told by your health care provider.  Wash your hands after you touch a wart.  Keep all follow-up visits as told by your health care provider. This is important. This information is not intended to replace advice given to you by your health care provider. Make sure you discuss any questions you have with your health care provider. Document Revised: 09/21/2017 Document Reviewed: 09/21/2017 Elsevier Patient Education  [employer].   If you have lab work done today you will be contacted with your lab results within the next 2 weeks.  If you have not heard from [place] then please contact us. The fastest way to get your results is to register for My Chart.  IF you received an x-ray today, you will receive an invoice from [facility]. Please contact [facility] at [phone] with questions or concerns regarding your invoice.   IF you received labwork today, you will receive an invoice from [facility]. Please contact [facility] at [phone] with questions or concerns regarding your invoice.   Our billing staff will not be able to assist you with questions regarding bills from these companies.  You will be contacted with the lab results as soon as they are available. The fastest way to get your results is  to activate your My Chart account. Instructions are located on the last page of this paperwork. If you have not heard from [place] regarding the results in 2 weeks, please contact this office.         Signed, [name], MD [facility]

## 2019-06-14 NOTE — Patient Instructions (Addendum)
I will refer you to dermatology but previous dermatologist thought the lesions on the scalp were benign seborrheic keratosis.  After wart procedure has healed, can apply Compound W once per night for a few weeks.  Follow-up with myself or dermatology if the wart returns.  Make sure to apply bandage, keep wound clean/covered as it heals from treatment today.  I will check fructosamine which is another test for diabetes, recheck in 2 months for repeat hemoglobin A1c.  Thank you for coming in today and let me know if there are questions.  No concerns on finger xray. Return to the clinic or go to the nearest emergency room if any of your symptoms worsen or new symptoms occur.   Warts  Warts are small growths on the skin. They are common and can occur on many areas of the body. A person may have one wart or several warts. In many cases, warts do not require treatment. They usually go away on their own over a period of many months to a few years. If needed, warts that cause problems or do not go away on their own can be treated. What are the causes? Warts are caused by a type of virus that is called human papillomavirus (HPV).  This virus can spread from person to person through direct contact.  Warts can also spread to other areas of the body when a person scratches a wart and then scratches another area of his or her body. What increases the risk? You are more likely to develop this condition if:  You are 53-53 years old.  You have a weakened body defense system (immune system).  You are Caucasian. What are the signs or symptoms? The main symptom of this condition is small growths on the skin. Warts may:  Be round or oval or have an irregular shape.  Have a rough surface.  Range in color from skin color to light yellow, brown, or gray.  Generally be less than  inch (1.3 cm) in size.  Go away and then come back again. Most warts are painless, but some can be painful if they are  large or occur in an area of the body where pressure will be applied to them, such as the bottom of the foot. How is this diagnosed? A wart can usually be diagnosed based on its appearance. In some cases, a tissue sample may be removed (biopsy) to be looked at under a microscope. How is this treated? In many cases, warts do not need treatment. Sometimes treatment is desired. If treatment is needed or desired, options may include:  Applying medicated solutions, creams, or patches to the wart. These may be over-the-counter or prescription medicines that make the skin soft so that layers will gradually shed away. In many cases, the medicine is applied one or two times per day and covered with a bandage.  Putting duct tape over the top of the wart (occlusion). You will leave the tape in place for as long as told by your health care provider and then replace it with a new strip of tape. This is done until the wart goes away.  Freezing the wart with liquid nitrogen (cryotherapy).  Burning the wart with: ? Laser treatment. ? An electrified probe (electrocautery).  Injection of a medicine (Candida antigen) into the wart to help the body's immune system fight off the wart.  Surgery to remove the wart. Follow these instructions at home: Medicines  Apply over-the-counter and prescription medicines only as told  by your health care provider.  Do not apply over-the-counter wart medicines to your face or genitals unless your health care provider tells you to do that. Lifestyle  Keep your immune system healthy. To do this: ? Eat a healthy, balanced diet. ? Get enough sleep. ? Do not use any products that contain nicotine or tobacco, such as cigarettes and e-cigarettes. If you need help quitting, ask your health care provider. General instructions   Wash your hands after you touch a wart.  Do not scratch or pick at a wart.  Avoid shaving hair that is over a wart.  Keep all follow-up visits as  told by your health care provider. This is important. Contact a health care provider if:  Your warts do not improve after treatment.  You have redness, swelling, or pain at the site of a wart.  You have bleeding from a wart that does not stop with light pressure.  You have diabetes and you develop a wart. Summary  Warts are small growths on the skin. They are common and can occur on many areas of the body.  In many cases, warts do not need treatment. Sometimes treatment is desired. If treatment is needed or desired, there are several treatment options.  Apply over-the-counter and prescription medicines only as told by your health care provider.  Wash your hands after you touch a wart.  Keep all follow-up visits as told by your health care provider. This is important. This information is not intended to replace advice given to you by your health care provider. Make sure you discuss any questions you have with your health care provider. Document Revised: 09/21/2017 Document Reviewed: 09/21/2017 Elsevier Patient Education  [employer].   If you have lab work done today you will be contacted with your lab results within the next 2 weeks.  If you have not heard from [place] then please contact us. The fastest way to get your results is to register for My Chart.   IF you received an x-ray today, you will receive an invoice from [facility]. Please contact [facility] at [phone] with questions or concerns regarding your invoice.   IF you received labwork today, you will receive an invoice from [facility]. Please contact [facility] at [phone] with questions or concerns regarding your invoice.   Our billing staff will not be able to assist you with questions regarding bills from these companies.  You will be contacted with the lab results as soon as they are available. The fastest way to get your results is to activate your My Chart account. Instructions are  located on the last page of this paperwork. If you have not heard from [place] regarding the results in 2 weeks, please contact this office.

## 2019-06-15 LAB — FRUCTOSAMINE: Fructosamine: 331 umol/L — ABNORMAL HIGH (ref 0–285)

## 2019-06-16 ENCOUNTER — Telehealth: Payer: Self-pay | Admitting: Family Medicine

## 2019-06-16 NOTE — Telephone Encounter (Signed)
PA request processed but eligibility could not be verified. Pt has [employer] ins

## 2019-06-17 ENCOUNTER — Encounter: Payer: Self-pay | Admitting: Family Medicine

## 2019-07-13 ENCOUNTER — Telehealth: Payer: Self-pay | Admitting: Family Medicine

## 2019-07-13 MED ORDER — SITAGLIPTIN PHOSPHATE 100 MG PO TABS: 100.0000 mg | ORAL_TABLET | Freq: Every day | ORAL | 1 refills | Status: DC

## 2019-07-13 NOTE — Telephone Encounter (Signed)
Due to formulary change, Onglyza changed to Januvia 100 mg daily after discussion with patient.

## 2019-09-14 ENCOUNTER — Other Ambulatory Visit: Payer: Self-pay

## 2019-09-14 ENCOUNTER — Ambulatory Visit: Payer: 59 | Admitting: Family Medicine

## 2019-09-14 ENCOUNTER — Encounter: Payer: Self-pay | Admitting: Family Medicine

## 2019-09-14 VITALS — BP 121/75 | HR 70 | Temp 98.0°F | Ht 71.0 in | Wt 173.0 lb

## 2019-09-14 DIAGNOSIS — K5900 Constipation, unspecified: Secondary | ICD-10-CM

## 2019-09-14 DIAGNOSIS — E119 Type 2 diabetes mellitus without complications: Secondary | ICD-10-CM | POA: Diagnosis not present

## 2019-09-14 DIAGNOSIS — E785 Hyperlipidemia, unspecified: Secondary | ICD-10-CM

## 2019-09-14 LAB — POCT GLYCOSYLATED HEMOGLOBIN (HGB A1C): Hemoglobin A1C: 6.8 % — AB (ref 4.0–5.6)

## 2019-09-14 MED ORDER — ROSUVASTATIN CALCIUM 10 MG PO TABS: 10.0000 mg | ORAL_TABLET | Freq: Every day | ORAL | 1 refills | Status: DC

## 2019-09-14 MED ORDER — METFORMIN HCL 1000 MG PO TABS: ORAL_TABLET | ORAL | 1 refills | Status: DC

## 2019-09-14 MED ORDER — SITAGLIPTIN PHOSPHATE 100 MG PO TABS: 100.0000 mg | ORAL_TABLET | Freq: Every day | ORAL | 1 refills | Status: DC

## 2019-09-14 NOTE — Progress Notes (Signed)
Subjective:  Patient ID: Marcus Hart, male    DOB: July 07, 1965  Age: 54 y.o. MRN: [id number]  CC:  Chief Complaint  Patient presents with  . Diabetes    pt states that since he started taking his new diabtic medication he has had constipation. pt has taken OTC medication to help with this side effect and it seem to help. pt checks his BS 3x a week and the results seem to vary from the 140's to the 120's per pt.     HPI Marcus Hart presents for   Diabetes: Last discussed in January.  Has been taking Metformin 1000 mg twice daily, Onglyza 5 mg daily.  Crestor for statin.  A1c at other office 8.0 in December.  Elevated fructosamine in January.  Home monitoring recommended, plan for A1c today.  Changed to Januvia due to insurance coverage, some constipation at times. Better now.  Home readings 120-140. Low 200 few days per week (2hr PP).  No symptomatic lows.  Microalbumin: checked today Optho, foot exam, pneumovax: UTD.   Lab Results  Component Value Date   HGBA1C 7.7 (H) 01/14/2018   HGBA1C 7.1 09/16/2017   HGBA1C 7.8 05/28/2017   Lab Results  Component Value Date   MICROALBUR <0.2 04/06/2015   LDLCALC 88 01/14/2018   CREATININE 0.80 01/14/2018   Hyperlipidemia: crestor 10mg  qd. No new side effects.   Lab Results  Component Value Date   CHOL 186 01/14/2018   HDL 66 01/14/2018   LDLCALC 88 01/14/2018   TRIG 159 (H) 01/14/2018   CHOLHDL 2.8 01/14/2018   Lab Results  Component Value Date   ALT 18 01/14/2018   AST 20 01/14/2018   ALKPHOS 53 01/14/2018   BILITOT 0.3 01/14/2018      History Patient Active Problem List   Diagnosis Date Noted  . DM2 (diabetes mellitus, type 2) ([place]) 02/13/2013  . Other and unspecified hyperlipidemia 02/13/2013   Past Medical History:  Diagnosis Date  . Diabetes mellitus ([place])   . Hyperlipidemia    Past Surgical History:  Procedure Laterality Date  . NO PAST SURGERIES     Allergies  Allergen Reactions  . Other     Prior to Admission medications   Medication Sig Start Date End Date Taking? Authorizing Provider  aspirin 81 MG tablet Take 81 mg by mouth daily.   Yes [provider]  Aspirin-Calcium Carbonate 81-777 MG TABS Take by mouth.   Yes [provider]  metFORMIN (GLUCOPHAGE) 1000 MG tablet TAKE 1 TABLET BY MOUTH TWICE DAILY WITH A MEAL 06/14/19  Yes [name], MD  rosuvastatin (CRESTOR) 10 MG tablet Take 1 tablet (10 mg total) by mouth daily. 06/14/19  Yes [name], MD  sitaGLIPtin (JANUVIA) 100 MG tablet Take 1 tablet (100 mg total) by mouth daily. 07/13/19  Yes [name], MD   Social History   Socioeconomic History  . Marital status: Married    Spouse name: [name]  . Number of children: 4  . Years of education: Not on file  . Highest education level: Not on file  Occupational History  . Occupation: Mining engineer  Tobacco Use  . Smoking status: Never Smoker  . Smokeless tobacco: Never Used  Substance and Sexual Activity  . Alcohol use: Never    Alcohol/week: 0.0 standard drinks  . Drug use: No  . Sexual activity: Yes  Other Topics Concern  . Not on file  Social History Narrative   Married   Education: [employer]  Exercise: No   Social Determinants of Health   Financial Resource Strain:   . Difficulty of Paying Living Expenses:   Food Insecurity:   . Worried About Charity fundraiser in the Last Year:   . Arboriculturist in the Last Year:   Transportation Needs:   . Film/video editor (Medical):   Marland Kitchen Lack of Transportation (Non-Medical):   Physical Activity:   . Days of Exercise per Week:   . Minutes of Exercise per Session:   Stress:   . Feeling of Stress :   Social Connections:   . Frequency of Communication with Friends and Family:   . Frequency of Social Gatherings with Friends and Family:   . Attends Religious Services:   . Active Member of Clubs or Organizations:   . Attends Archivist Meetings:   Marland Kitchen Marital  Status:   Intimate Partner Violence:   . Fear of Current or Ex-Partner:   . Emotionally Abused:   Marland Kitchen Physically Abused:   . Sexually Abused:     Review of Systems  Constitutional: Negative for fatigue and unexpected weight change.  Eyes: Negative for visual disturbance.  Respiratory: Negative for cough, chest tightness and shortness of breath.   Cardiovascular: Negative for chest pain, palpitations and leg swelling.  Gastrointestinal: Negative for abdominal pain and blood in stool.  Neurological: Negative for dizziness, light-headedness and headaches.     Objective:   Vitals:   09/14/19 0943  BP: 121/75  Pulse: 70  Temp: 98 F (36.7 C)  TempSrc: Temporal  SpO2: 100%  Weight: 173 lb (78.5 kg)  Height: 5\' 11"  (1.803 m)     Physical Exam Vitals reviewed.  Constitutional:      Appearance: He is well-developed.  HENT:     Head: Normocephalic and atraumatic.  Eyes:     Pupils: Pupils are equal, round, and reactive to light.  Neck:     Vascular: No carotid bruit or JVD.  Cardiovascular:     Rate and Rhythm: Normal rate and regular rhythm.     Heart sounds: Normal heart sounds. No murmur.  Pulmonary:     Effort: Pulmonary effort is normal.     Breath sounds: Normal breath sounds. No rales.  Abdominal:     General: Abdomen is flat. Bowel sounds are normal. There is no distension.     Tenderness: There is no abdominal tenderness.  Skin:    General: Skin is warm and dry.  Neurological:     Mental Status: He is alert and oriented to person, place, and time.     Results for orders placed or performed in visit on 09/14/19  POCT glycosylated hemoglobin (Hb A1C)  Result Value Ref Range   Hemoglobin A1C 6.8 (A) 4.0 - 5.6 %   HbA1c POC (<> result, manual entry)     HbA1c, POC (prediabetic range)     HbA1c, POC (controlled diabetic range)        Assessment & Plan:  Marcus Hart is a 54 y.o. male . Type 2 diabetes mellitus without complication, without long-term  current use of insulin (HCC) - Plan: Comprehensive metabolic panel, Microalbumin / creatinine urine ratio, POCT glycosylated hemoglobin (Hb A1C), metFORMIN (GLUCOPHAGE) 1000 MG tablet  -Improved control by A1c, episodic 200s likely related to diet, handout given.  Recheck 6 months.  Continue same regimen for now   Hyperlipidemia, unspecified hyperlipidemia type - Plan: Comprehensive metabolic panel, Lipid panel, rosuvastatin (CRESTOR) 10 MG tablet  -Tolerating Crestor,  check labs, repeat testing in 6 months  Constipation, unspecified constipation type  -New problem.  Possibly diet related.  Improved with over-the-counter stool softener.  Fluids, fiber in the diet, option of Citrucel or other fiber supplement, with RTC precautions.  Handout given.  No orders of the defined types were placed in this encounter.  Patient Instructions       If you have lab work done today you will be contacted with your lab results within the next 2 weeks.  If you have not heard from [place] then please contact us. The fastest way to get your results is to register for My Chart.   IF you received an x-ray today, you will receive an invoice from [facility]. Please contact [facility] at [phone] with questions or concerns regarding your invoice.   IF you received labwork today, you will receive an invoice from [facility]. Please contact [facility] at [phone] with questions or concerns regarding your invoice.   Our billing staff will not be able to assist you with questions regarding bills from these companies.  You will be contacted with the lab results as soon as they are available. The fastest way to get your results is to activate your My Chart account. Instructions are located on the last page of this paperwork. If you have not heard from [place] regarding the results in 2 weeks, please contact this office.         Signed, [name], MD [facility]

## 2019-09-14 NOTE — Patient Instructions (Addendum)
Drink plenty of water, fiber in diet - supplement like Citrucel if needed for constipation. Return to the clinic or go to the nearest emergency room if any of your symptoms worsen or new symptoms occur.  Good news.  Your hemoglobin A1c is 6.8 today.  Continue same medicines, recheck levels in the next 6 months.  If you continue to have readings in the 200s, can follow-up sooner, but that can be related at times due to what and how you are eating.  See information below.  Thanks for coming in today and take care.   Diabetes Mellitus and Nutrition, Adult When you have diabetes (diabetes mellitus), it is very important to have healthy eating habits because your blood sugar (glucose) levels are greatly affected by what you eat and drink. Eating healthy foods in the appropriate amounts, at about the same times every day, can help you:  Control your blood glucose.  Lower your risk of heart disease.  Improve your blood pressure.  Reach or maintain a healthy weight. Every person with diabetes is different, and each person has different needs for a meal plan. Your health care provider may recommend that you work with a diet and nutrition specialist (dietitian) to make a meal plan that is best for you. Your meal plan may vary depending on factors such as:  The calories you need.  The medicines you take.  Your weight.  Your blood glucose, blood pressure, and cholesterol levels.  Your activity level.  Other health conditions you have, such as heart or kidney disease. How do carbohydrates affect me? Carbohydrates, also called carbs, affect your blood glucose level more than any other type of food. Eating carbs naturally raises the amount of glucose in your blood. Carb counting is a method for keeping track of how many carbs you eat. Counting carbs is important to keep your blood glucose at a healthy level, especially if you use insulin or take certain oral diabetes medicines. It is important to  know how many carbs you can safely have in each meal. This is different for every person. Your dietitian can help you calculate how many carbs you should have at each meal and for each snack. Foods that contain carbs include:  Bread, cereal, rice, pasta, and crackers.  Potatoes and corn.  Peas, beans, and lentils.  Milk and yogurt.  Fruit and juice.  Desserts, such as cakes, cookies, ice cream, and candy. How does alcohol affect me? Alcohol can cause a sudden decrease in blood glucose (hypoglycemia), especially if you use insulin or take certain oral diabetes medicines. Hypoglycemia can be a life-threatening condition. Symptoms of hypoglycemia (sleepiness, dizziness, and confusion) are similar to symptoms of having too much alcohol. If your health care provider says that alcohol is safe for you, follow these guidelines:  Limit alcohol intake to no more than 1 drink per day for nonpregnant women and 2 drinks per day for men. One drink equals 12 oz of beer, 5 oz of wine, or 1 oz of hard liquor.  Do not drink on an empty stomach.  Keep yourself hydrated with water, diet soda, or unsweetened iced tea.  Keep in mind that regular soda, juice, and other mixers may contain a lot of sugar and must be counted as carbs. What are tips for following this plan?  Reading food labels  Start by checking the serving size on the "Nutrition Facts" label of packaged foods and drinks. The amount of calories, carbs, fats, and other nutrients listed  on the label is based on one serving of the item. Many items contain more than one serving per package.  Check the total grams (g) of carbs in one serving. You can calculate the number of servings of carbs in one serving by dividing the total carbs by 15. For example, if a food has 30 g of total carbs, it would be equal to 2 servings of carbs.  Check the number of grams (g) of saturated and trans fats in one serving. Choose foods that have low or no amount of  these fats.  Check the number of milligrams (mg) of salt (sodium) in one serving. Most people should limit total sodium intake to less than 2,300 mg per day.  Always check the nutrition information of foods labeled as "low-fat" or "nonfat". These foods may be higher in added sugar or refined carbs and should be avoided.  Talk to your dietitian to identify your daily goals for nutrients listed on the label. Shopping  Avoid buying canned, premade, or processed foods. These foods tend to be high in fat, sodium, and added sugar.  Shop around the outside edge of the grocery store. This includes fresh fruits and vegetables, bulk grains, fresh meats, and fresh dairy. Cooking  Use low-heat cooking methods, such as baking, instead of high-heat cooking methods like deep frying.  Cook using healthy oils, such as olive, canola, or sunflower oil.  Avoid cooking with butter, cream, or high-fat meats. Meal planning  Eat meals and snacks regularly, preferably at the same times every day. Avoid going long periods of time without eating.  Eat foods high in fiber, such as fresh fruits, vegetables, beans, and whole grains. Talk to your dietitian about how many servings of carbs you can eat at each meal.  Eat 4-6 ounces (oz) of lean protein each day, such as lean meat, chicken, fish, eggs, or tofu. One oz of lean protein is equal to: ? 1 oz of meat, chicken, or fish. ? 1 egg. ?  cup of tofu.  Eat some foods each day that contain healthy fats, such as avocado, nuts, seeds, and fish. Lifestyle  Check your blood glucose regularly.  Exercise regularly as told by your health care provider. This may include: ? 150 minutes of moderate-intensity or vigorous-intensity exercise each week. This could be brisk walking, biking, or water aerobics. ? Stretching and doing strength exercises, such as yoga or weightlifting, at least 2 times a week.  Take medicines as told by your health care provider.  Do not use  any products that contain nicotine or tobacco, such as cigarettes and e-cigarettes. If you need help quitting, ask your health care provider.  Work with a Social worker or diabetes educator to identify strategies to manage stress and any emotional and social challenges. Questions to ask a health care provider  Do I need to meet with a diabetes educator?  Do I need to meet with a dietitian?  What number can I call if I have questions?  When are the best times to check my blood glucose? Where to find more information:  American Diabetes Association: diabetes.org  Academy of Nutrition and Dietetics: www.eatright.[employer] of Diabetes and Digestive and Kidney Diseases (NIH): [employer] Summary  A healthy meal plan will help you control your blood glucose and maintain a healthy lifestyle.  Working with a diet and nutrition specialist (dietitian) can help you make a meal plan that is best for you.  Keep in mind that carbohydrates (carbs) and  alcohol have immediate effects on your blood glucose levels. It is important to count carbs and to use alcohol carefully. This information is not intended to replace advice given to you by your health care provider. Make sure you discuss any questions you have with your health care provider. Document Revised: 04/16/2017 Document Reviewed: 06/08/2016 Elsevier Patient Education  2020 [employer].   About Constipation  Constipation Overview Constipation is the most common gastrointestinal complaint -- about 4 million Americans experience constipation and make 2.5 million physician visits a year to get help for the problem.  Constipation can occur when the colon absorbs too much water, the colon's muscle contraction is slow or sluggish, and/or there is delayed transit time through the colon.  The result is stool that is hard and dry.  Indicators of constipation include straining during bowel movements greater than 25% of the time,  having fewer than three bowel movements per week, and/or the feeling of incomplete evacuation.  There are established guidelines ([place] II ) for defining constipation. A person needs to have two or more of the following symptoms for at least 12 weeks (not necessarily consecutive) in the preceding 12 months: . Straining in  greater than 25% of bowel movements . Lumpy or hard stools in greater than 25% of bowel movements . Sensation of incomplete emptying in greater than 25% of bowel movements . Sensation of anorectal obstruction/blockade in greater than 25% of bowel movements . Manual maneuvers to help empty greater than 25% of bowel movements (e.g., digital evacuation, support of the pelvic floor)  . Less than  3 bowel movements/week . Loose stools are not present, and criteria for irritable bowel syndrome are insufficient  Common Causes of Constipation . Lack of fiber in your diet . Lack of physical activity . Medications, including iron and calcium supplements  . Dairy intake . Dehydration . Abuse of laxatives  Travel  Irritable Bowel Syndrome  Pregnancy  Luteal phase of menstruation (after ovulation and before menses)  Colorectal problems  Intestinal Dysfunction  Treating Constipation  There are several ways of treating constipation, including changes to diet and exercise, use of laxatives, adjustments to the pelvic floor, and scheduled toileting.  These treatments include: . increasing fiber and fluids in the diet  . increasing physical activity . learning muscle coordination   learning proper toileting techniques and toileting modifications   designing and sticking  to a toileting schedule     2007, [employer] Doc.22   If you have lab work done today you will be contacted with your lab results within the next 2 weeks.  If you have not heard from [place] then please contact us. The fastest way to get your results is to register for My Chart.   IF you  received an x-ray today, you will receive an invoice from [facility]. Please contact [facility] at [phone] with questions or concerns regarding your invoice.   IF you received labwork today, you will receive an invoice from [place]. Please contact [facility] at [phone] with questions or concerns regarding your invoice.   Our billing staff will not be able to assist you with questions regarding bills from these companies.  You will be contacted with the lab results as soon as they are available. The fastest way to get your results is to activate your My Chart account. Instructions are located on the last page of this paperwork. If you have not heard from [place] regarding the results in 2 weeks, please contact this  office.

## 2019-09-15 LAB — COMPREHENSIVE METABOLIC PANEL
ALT: 18 IU/L (ref 0–44)
AST: 20 IU/L (ref 0–40)
Albumin/Globulin Ratio: 2.3 — ABNORMAL HIGH (ref 1.2–2.2)
Albumin: 4.4 g/dL (ref 3.8–4.9)
Alkaline Phosphatase: 44 IU/L (ref 39–117)
BUN/Creatinine Ratio: 19 (ref 9–20)
BUN: 15 mg/dL (ref 6–24)
Bilirubin Total: 0.2 mg/dL (ref 0.0–1.2)
CO2: 19 mmol/L — ABNORMAL LOW (ref 20–29)
Calcium: 9.1 mg/dL (ref 8.7–10.2)
Chloride: 103 mmol/L (ref 96–106)
Creatinine, Ser: 0.79 mg/dL (ref 0.76–1.27)
GFR calc Af Amer: 119 mL/min/{1.73_m2} (ref >59)
GFR calc non Af Amer: 103 mL/min/{1.73_m2} (ref >59)
Globulin, Total: 1.9 g/dL (ref 1.5–4.5)
Glucose: 161 mg/dL — ABNORMAL HIGH (ref 65–99)
Potassium: 4.2 mmol/L (ref 3.5–5.2)
Sodium: 138 mmol/L (ref 134–144)
Total Protein: 6.3 g/dL (ref 6.0–8.5)

## 2019-09-15 LAB — MICROALBUMIN / CREATININE URINE RATIO
Creatinine, Urine: 87.6 mg/dL
Microalb/Creat Ratio: <3 mg/g creat (ref 0–29)
Microalbumin, Urine: <3 ug/mL

## 2019-09-15 LAB — LIPID PANEL
Chol/HDL Ratio: 2.7 ratio (ref 0.0–5.0)
Cholesterol, Total: 157 mg/dL (ref 100–199)
HDL: 58 mg/dL (ref >39)
LDL Chol Calc (NIH): 79 mg/dL (ref 0–99)
Triglycerides: 111 mg/dL (ref 0–149)
VLDL Cholesterol Cal: 20 mg/dL (ref 5–40)

## 2019-11-10 ENCOUNTER — Telehealth (INDEPENDENT_AMBULATORY_CARE_PROVIDER_SITE_OTHER): Payer: 59 | Admitting: Family Medicine

## 2019-11-10 ENCOUNTER — Other Ambulatory Visit: Payer: Self-pay

## 2019-11-10 DIAGNOSIS — R509 Fever, unspecified: Secondary | ICD-10-CM | POA: Diagnosis not present

## 2019-11-10 DIAGNOSIS — N390 Urinary tract infection, site not specified: Secondary | ICD-10-CM

## 2019-11-10 MED ORDER — CIPROFLOXACIN HCL 500 MG PO TABS: 500.0000 mg | ORAL_TABLET | Freq: Two times a day (BID) | ORAL | 0 refills | Status: DC

## 2019-11-10 NOTE — Progress Notes (Signed)
Virtual Visit via Video Note  I called Marcus Hart on 11/10/19 at 5:16 PM  - no answer. Left VM that I will try to reach him again.  6:17 PM- repeat call, connected.  Patient at home, I am calling from office.  by a video enabled telemedicine application and verified that I am speaking with the correct person using two identifiers.   I discussed the limitations, risks, security and privacy concerns of performing an evaluation and management service by telephone and the availability of in person appointments. I also discussed with the patient that there may be a patient responsible charge related to this service. The patient expressed understanding and agreed to proceed, consent obtained  Chief complaint:  Chief Complaint  Patient presents with  . Fever    Pt has had a fever for 4 days now of 100.1-100. Pt states he has no no other symptoms other than some loss of appitite and nausa. fever hits pt as night. pt has taken tylonol and ibprofin.Pt is not vaccinated.     History of Present Illness: Marcus Hart is a 54 y.o. male  Fever: Started 3 days ago.  Temp 100.1, now has been around 100 past few nights.  Temp 98.9 now.  Feels better today.  Had been taking tylenol every 6 hours or ibuprofen past few days for fever.  Cold feeling, chills, shaking chills with fever. Last felt last night. Slight HA today, body feels tired. Better today. Headache only when has fever. No cough, no sore throat, no change in taste/smell. No shortness of breath. No known sick contacts. Not vaccinated against Covid 19.  No abd pain, no n/v.  Has had some urinary urgency past few days only. Possibly slight better today.  No dysuria. No hematuria. Feels better with urination today.  No known hx of prostatitis.  Lab Results  Component Value Date   PSA1 0.3 06/18/2017   PSA 0.33 07/24/2015   PSA 0.25 02/13/2013         Patient Active Problem List   Diagnosis Date Noted  . DM2 (diabetes mellitus,  type 2) ([place]) 02/13/2013  . Other and unspecified hyperlipidemia 02/13/2013   Past Medical History:  Diagnosis Date  . Diabetes mellitus ([place])   . Hyperlipidemia    Past Surgical History:  Procedure Laterality Date  . NO PAST SURGERIES     Allergies  Allergen Reactions  . Other    Prior to Admission medications   Medication Sig Start Date End Date Taking? Authorizing Provider  aspirin 81 MG tablet Take 81 mg by mouth daily.   Yes [provider]  Aspirin-Calcium Carbonate 81-777 MG TABS Take by mouth.   Yes [provider]  metFORMIN (GLUCOPHAGE) 1000 MG tablet TAKE 1 TABLET BY MOUTH TWICE DAILY WITH A MEAL 09/14/19  Yes [name], MD  rosuvastatin (CRESTOR) 10 MG tablet Take 1 tablet (10 mg total) by mouth daily. 09/14/19  Yes [name], MD  sitaGLIPtin (JANUVIA) 100 MG tablet Take 1 tablet (100 mg total) by mouth daily. 09/14/19  Yes [name], MD   Social History   Socioeconomic History  . Marital status: Married    Spouse name: [name]  . Number of children: 4  . Years of education: Not on file  . Highest education level: Not on file  Occupational History  . Occupation: Mining engineer  Tobacco Use  . Smoking status: Never Smoker  . Smokeless tobacco: Never Used  Vaping Use  . Vaping Use:  Never used  Substance and Sexual Activity  . Alcohol use: Never    Alcohol/week: 0.0 standard drinks  . Drug use: No  . Sexual activity: Yes  Other Topics Concern  . Not on file  Social History Narrative   Married   Education: Secretary/administrator   Exercise: No   Social Determinants of Radio broadcast assistant Strain:   . Difficulty of Paying Living Expenses:   Food Insecurity:   . Worried About Charity fundraiser in the Last Year:   . Arboriculturist in the Last Year:   Transportation Needs:   . Film/video editor (Medical):   Marland Kitchen Lack of Transportation (Non-Medical):   Physical Activity:   . Days of Exercise per Week:   . Minutes of  Exercise per Session:   Stress:   . Feeling of Stress :   Social Connections:   . Frequency of Communication with Friends and Family:   . Frequency of Social Gatherings with Friends and Family:   . Attends Religious Services:   . Active Member of Clubs or Organizations:   . Attends Archivist Meetings:   Marland Kitchen Marital Status:   Intimate Partner Violence:   . Fear of Current or Ex-Partner:   . Emotionally Abused:   Marland Kitchen Physically Abused:   . Sexually Abused:     Observations/Objective: There were no vitals filed for this visit. No distress on video.  Appropriate responses.  Nontoxic appearing.  Normal respiratory effort, no respiratory distress.  Assessment and Plan: Fever, unspecified - Plan: ciprofloxacin (CIPRO) 500 MG tablet  Urinary tract infection without hematuria, site unspecified - Plan: ciprofloxacin (CIPRO) 500 MG tablet Urinary urgency, frequency, with associated fever, initial shaking chills which is somewhat improved today.  Suspicious for urinary tract infection, and possible prostatitis.  We discussed limitations and testing as video visit as well as option of urgent care or ER visit,  but with some improvement today decided to treat for presumptive UTI, prostatitis with ciprofloxacin 500 mg twice daily for 10 days.  Symptomatic care with fluids, Tylenol if needed.  Tendinopathy risk of quinolones discussed.  If any return of shaking chills should proceed to ER.  If not improving into next week in office evaluation recommended.  Understanding of plan expressed and all questions were answered.   Follow Up Instructions: ER precautions given over the weekend if any return of shaking chills or worsening symptoms, follow-up in office next week if symptoms or not significantly improved or resolving.   I discussed the assessment and treatment plan with the patient. The patient was provided an opportunity to ask questions and all were answered. The patient agreed with the  plan and demonstrated an understanding of the instructions.   The patient was advised to call back or seek an in-person evaluation if the symptoms worsen or if the condition fails to improve as anticipated.  I provided 20  minutes of non-face-to-face time during this encounter.   [name], MD

## 2019-11-10 NOTE — Patient Instructions (Signed)
° ° ° °  If you have lab work done today you will be contacted with your lab results within the next 2 weeks.  If you have not heard from us then please contact us. The fastest way to get your results is to register for My Chart. ° ° °IF you received an x-ray today, you will receive an invoice from [facility]. Please contact [facility] at [phone] with questions or concerns regarding your invoice.  ° °IF you received labwork today, you will receive an invoice from [facility]. Please contact [facility] at [phone] with questions or concerns regarding your invoice.  ° °Our billing staff will not be able to assist you with questions regarding bills from these companies. ° °You will be contacted with the lab results as soon as they are available. The fastest way to get your results is to activate your My Chart account. Instructions are located on the last page of this paperwork. If you have not heard from us regarding the results in 2 weeks, please contact this office. °  ° ° ° °

## 2019-12-07 ENCOUNTER — Other Ambulatory Visit: Payer: Self-pay | Admitting: Family Medicine

## 2019-12-07 DIAGNOSIS — E785 Hyperlipidemia, unspecified: Secondary | ICD-10-CM

## 2019-12-09 ENCOUNTER — Other Ambulatory Visit: Payer: Self-pay | Admitting: Family Medicine

## 2019-12-09 DIAGNOSIS — E119 Type 2 diabetes mellitus without complications: Secondary | ICD-10-CM

## 2019-12-14 ENCOUNTER — Ambulatory Visit (INDEPENDENT_AMBULATORY_CARE_PROVIDER_SITE_OTHER): Payer: 59 | Admitting: Family Medicine

## 2019-12-14 ENCOUNTER — Other Ambulatory Visit: Payer: Self-pay

## 2019-12-14 ENCOUNTER — Ambulatory Visit: Payer: 59

## 2019-12-14 DIAGNOSIS — Z0000 Encounter for general adult medical examination without abnormal findings: Secondary | ICD-10-CM

## 2019-12-14 DIAGNOSIS — E119 Type 2 diabetes mellitus without complications: Secondary | ICD-10-CM

## 2019-12-14 DIAGNOSIS — I10 Essential (primary) hypertension: Secondary | ICD-10-CM

## 2019-12-15 ENCOUNTER — Other Ambulatory Visit: Payer: Self-pay

## 2019-12-15 ENCOUNTER — Encounter: Payer: Self-pay | Admitting: Family Medicine

## 2019-12-15 ENCOUNTER — Ambulatory Visit (INDEPENDENT_AMBULATORY_CARE_PROVIDER_SITE_OTHER): Payer: 59 | Admitting: Family Medicine

## 2019-12-15 VITALS — BP 116/69 | HR 62 | Temp 98.0°F | Ht 71.0 in | Wt 171.8 lb

## 2019-12-15 DIAGNOSIS — Z0001 Encounter for general adult medical examination with abnormal findings: Secondary | ICD-10-CM

## 2019-12-15 DIAGNOSIS — E1165 Type 2 diabetes mellitus with hyperglycemia: Secondary | ICD-10-CM

## 2019-12-15 DIAGNOSIS — Z0000 Encounter for general adult medical examination without abnormal findings: Secondary | ICD-10-CM

## 2019-12-15 LAB — COMPREHENSIVE METABOLIC PANEL
ALT: 15 IU/L (ref 0–44)
AST: 14 IU/L (ref 0–40)
Albumin/Globulin Ratio: 2.3 — ABNORMAL HIGH (ref 1.2–2.2)
Albumin: 4.6 g/dL (ref 3.8–4.9)
Alkaline Phosphatase: 47 IU/L — ABNORMAL LOW (ref 48–121)
BUN/Creatinine Ratio: 13 (ref 9–20)
BUN: 11 mg/dL (ref 6–24)
Bilirubin Total: 0.4 mg/dL (ref 0.0–1.2)
CO2: 22 mmol/L (ref 20–29)
Calcium: 9.6 mg/dL (ref 8.7–10.2)
Chloride: 102 mmol/L (ref 96–106)
Creatinine, Ser: 0.82 mg/dL (ref 0.76–1.27)
GFR calc Af Amer: 117 mL/min/{1.73_m2} (ref >59)
GFR calc non Af Amer: 101 mL/min/{1.73_m2} (ref >59)
Globulin, Total: 2 g/dL (ref 1.5–4.5)
Glucose: 108 mg/dL — ABNORMAL HIGH (ref 65–99)
Potassium: 4.5 mmol/L (ref 3.5–5.2)
Sodium: 140 mmol/L (ref 134–144)
Total Protein: 6.6 g/dL (ref 6.0–8.5)

## 2019-12-15 LAB — HEMOGLOBIN A1C
Est. average glucose Bld gHb Est-mCnc: 166 mg/dL
Hgb A1c MFr Bld: 7.4 % — ABNORMAL HIGH (ref 4.8–5.6)

## 2019-12-15 MED ORDER — TRULICITY 0.75 MG/0.5ML ~~LOC~~ SOAJ: 0.7500 mg | SUBCUTANEOUS | 1 refills | Status: DC

## 2019-12-15 MED ORDER — GLUCOSE BLOOD VI STRP: ORAL_STRIP | 4 refills | Status: DC

## 2019-12-15 NOTE — Progress Notes (Signed)
Subjective:  Patient ID: Marcus Hart, male    DOB: 1965-09-30  Age: 54 y.o. MRN: [id number]  CC:  Chief Complaint  Patient presents with  . Annual Exam    lab results already in   . Diabetes    med change since he stopped junivia    HPI Marcus Hart presents for   Annual physical exam.  History of type 2 diabetes  Diabetes: Complicated by hyperglycemia Last visit in April, at that time he was taking Metformin 1000 mg twice daily.  Januvia in place of onglyza.  A1c was improved in April at 6.8.  Recent testing yesterday 7.4. Stopped [place] about 6 weeks ago as it was causing constipation. Tried stopping and restarting - each time caused constipation. Now bM's normal.  Home readings: fasting 112, 116 Postprandial - 170-180, 200 at times. Asking about trulicity.  Metformin 1000mg  bid.  Microalbumin: Normal ratio in April. Optho, foot exam, pneumovax: Up-to-date No FH of multiple endocrine neoplasia, or pancreatitis.  Covid vaccine: plans to get as soon as possible.   Lab Results  Component Value Date   HGBA1C 7.4 (H) 12/14/2019   HGBA1C 6.8 (A) 09/14/2019   HGBA1C 7.7 (H) 01/14/2018   Lab Results  Component Value Date   MICROALBUR <0.2 04/06/2015   LDLCALC 79 09/14/2019   CREATININE 0.82 12/14/2019   Hyperlipidemia: crestor 10mg  qd. No new myalgias.  On asa 81mg  qd. No new bleeding.  Lab Results  Component Value Date   CHOL 157 09/14/2019   HDL 58 09/14/2019   LDLCALC 79 09/14/2019   TRIG 111 09/14/2019   CHOLHDL 2.7 09/14/2019   Lab Results  Component Value Date   ALT 15 12/14/2019   AST 14 12/14/2019   ALKPHOS 47 (L) 12/14/2019   BILITOT 0.4 12/14/2019      Cancer screening: colonoscopy 10/27/17. Repeat in 10 yrs.  Prostate - declines testing after risks and benefits of testing discussed.  Lab Results  Component Value Date   PSA1 0.3 06/18/2017   PSA 0.33 07/24/2015   PSA 0.25 02/13/2013   Depression screen [facility] 2/9 12/15/2019 11/10/2019  09/14/2019 06/14/2019 01/14/2018  Decreased Interest 0 0 0 0 0  Down, Depressed, Hopeless 0 0 0 0 0  PHQ - 2 Score 0 0 0 0 0    Hearing Screening   125Hz  250Hz  500Hz  1000Hz  2000Hz  3000Hz  4000Hz  6000Hz  8000Hz   Right ear:           Left ear:             Visual Acuity Screening   Right eye Left eye Both eyes  Without correction:     With correction: 20/13-1 20/15 20/13-1  optho 01/2019.   Dental: 6 months ago    Exercise: no regular exercise. Some physical activity work.    History Patient Active Problem List   Diagnosis Date Noted  . DM2 (diabetes mellitus, type 2) ([place]) 02/13/2013  . Other and unspecified hyperlipidemia 02/13/2013   Past Medical History:  Diagnosis Date  . Diabetes mellitus ([place])   . Diabetes mellitus without complication ([place])    Phreesia 11/10/2019  . Hyperlipidemia    Past Surgical History:  Procedure Laterality Date  . NO PAST SURGERIES     Allergies  Allergen Reactions  . Other    Prior to Admission medications   Medication Sig Start Date End Date Taking? Authorizing Provider  aspirin 81 MG tablet Take 81 mg by mouth daily.   Yes [provider]  metFORMIN (GLUCOPHAGE) 1000 MG tablet TAKE 1 TABLET BY MOUTH TWICE DAILY WITH A MEAL 12/09/19  Yes [name], MD  rosuvastatin (CRESTOR) 10 MG tablet TAKE 1 TABLET(10 MG) BY MOUTH DAILY 12/07/19  Yes [name], MD  sitaGLIPtin (JANUVIA) 100 MG tablet Take 1 tablet (100 mg total) by mouth daily. Patient not taking: Reported on 12/15/2019 09/14/19   [name], MD   Social History   Socioeconomic History  . Marital status: Married    Spouse name: [name]  . Number of children: 4  . Years of education: Not on file  . Highest education level: Not on file  Occupational History  . Occupation: Mining engineer  Tobacco Use  . Smoking status: Never Smoker  . Smokeless tobacco: Never Used  Vaping Use  . Vaping Use: Never used  Substance and Sexual Activity  . Alcohol use: Never     Alcohol/week: 0.0 standard drinks  . Drug use: No  . Sexual activity: Yes  Other Topics Concern  . Not on file  Social History Narrative   Married   Education: Secretary/administrator   Exercise: No   Social Determinants of Radio broadcast assistant Strain:   . Difficulty of Paying Living Expenses:   Food Insecurity:   . Worried About Charity fundraiser in the Last Year:   . Arboriculturist in the Last Year:   Transportation Needs:   . Film/video editor (Medical):   Marland Kitchen Lack of Transportation (Non-Medical):   Physical Activity:   . Days of Exercise per Week:   . Minutes of Exercise per Session:   Stress:   . Feeling of Stress :   Social Connections:   . Frequency of Communication with Friends and Family:   . Frequency of Social Gatherings with Friends and Family:   . Attends Religious Services:   . Active Member of Clubs or Organizations:   . Attends Archivist Meetings:   Marland Kitchen Marital Status:   Intimate Partner Violence:   . Fear of Current or Ex-Partner:   . Emotionally Abused:   Marland Kitchen Physically Abused:   . Sexually Abused:     Review of Systems  13 point review of systems per patient health survey noted.  Negative other than as indicated above or in HPI.   Objective:   Vitals:   12/15/19 1405  BP: 116/69  Pulse: 62  Temp: 98 F (36.7 C)  TempSrc: Temporal  SpO2: 97%  Weight: 171 lb 12.8 oz (77.9 kg)  Height: 5\' 11"  (1.803 m)    Physical Exam Vitals reviewed.  Constitutional:      Appearance: He is well-developed.  HENT:     Head: Normocephalic and atraumatic.     Right Ear: External ear normal.     Left Ear: External ear normal.  Eyes:     Conjunctiva/sclera: Conjunctivae normal.     Pupils: Pupils are equal, round, and reactive to light.  Neck:     Thyroid: No thyromegaly.  Cardiovascular:     Rate and Rhythm: Normal rate and regular rhythm.     Heart sounds: Normal heart sounds.  Pulmonary:     Effort: Pulmonary effort is normal. No respiratory  distress.     Breath sounds: Normal breath sounds. No wheezing.  Abdominal:     General: There is no distension.     Palpations: Abdomen is soft.     Tenderness: There is no abdominal tenderness.  Musculoskeletal:  General: No tenderness. Normal range of motion.     Cervical back: Normal range of motion and neck supple.  Lymphadenopathy:     Cervical: No cervical adenopathy.  Skin:    General: Skin is warm and dry.  Neurological:     Mental Status: He is alert and oriented to person, place, and time.     Deep Tendon Reflexes: Reflexes are normal and symmetric.  Psychiatric:        Behavior: Behavior normal.      Assessment & Plan:  Marcus Hart is a 54 y.o. male . Annual physical exam  - -anticipatory guidance as below in AVS, screening labs above. Health maintenance items as above in HPI discussed/recommended as applicable.   -Covid vaccine discussed and plans to schedule that very soon.  Type 2 diabetes mellitus with hyperglycemia, without long-term current use of insulin (HCC) - Plan: Dulaglutide (TRULICITY) 5.40 GQ/6.7YP SOPN, glucose blood test strip  -Off Januvia due to cost.  Start Trulicity, low-dose, discount card given, instructed on use with demonstration device in office.  Understanding expressed.  Continue Metformin same dose, hypoglycemic precautions, recheck 3 months.  Meds ordered this encounter  Medications  . Dulaglutide (TRULICITY) 9.50 DT/2.6ZT SOPN    Sig: Inject 0.5 mLs (0.75 mg total) into the skin once a week.    Dispense:  6 mL    Refill:  1  . glucose blood test strip    Sig: Use as instructed    Dispense:  100 each    Refill:  4   Patient Instructions   Start Trulicity once per week.  Continue Metformin.  Follow-up in 3 months for repeat testing.  Let me know if there are questions prior to that time.  Get covid vaccine as soon as possible.  [email or web address]  Keeping you  healthy  Get these tests  Blood pressure- Have your blood pressure checked once a year by your healthcare provider.  Normal blood pressure is 120/80  Weight- Have your body mass index (BMI) calculated to screen for obesity.  BMI is a measure of body fat based on height and weight. You can also calculate your own BMI at [email or web address].  Cholesterol- Have your cholesterol checked every year.  Diabetes- Have your blood sugar checked regularly if you have high blood pressure, high cholesterol, have a family history of diabetes or if you are overweight.  Screening for Colon Cancer- Colonoscopy starting at age 72.  Screening may begin sooner depending on your family history and other health conditions. Follow up colonoscopy as directed by your Gastroenterologist.  Screening for Prostate Cancer- Both blood work (PSA) and a rectal exam help screen for Prostate Cancer.  Screening begins at age 57 with African-American men and at age 67 with Caucasian men.  Screening may begin sooner depending on your family history.  Take these medicines  Aspirin- One aspirin daily can help prevent Heart disease and Stroke.  Flu shot- Every fall.  Tetanus- Every 10 years.  Zostavax- Once after the age of 55 to prevent Shingles.  Pneumonia shot- Once after the age of 25; if you are younger than 66, ask your healthcare provider if you need a Pneumonia shot.  Take these steps  Don't smoke- If you do smoke, talk to your doctor about quitting.  For tips on how to quit, go to www.smokefree.gov or call 1-800-QUIT-NOW.  Be physically active- Exercise 5 days a week for at least 30 minutes.  If you are not already physically active start slow  and gradually work up to 30 minutes of moderate physical activity.  Examples of moderate activity include walking briskly, mowing the yard, dancing, swimming, bicycling, etc.  Eat a healthy diet- Eat a variety of healthy food such as fruits, vegetables, low fat milk,  low fat cheese, yogurt, lean meant, poultry, fish, beans, tofu, etc. For more information go to www.thenutritionsource.org  Drink alcohol in moderation- Limit alcohol intake to less than two drinks a day. Never drink and drive.  Dentist- Brush and floss twice daily; visit your dentist twice a year.  Depression- Your emotional health is as important as your physical health. If you're feeling down, or losing interest in things you would normally enjoy please talk to your healthcare provider.  Eye exam- Visit your eye doctor every year.  Safe sex- If you may be exposed to a sexually transmitted infection, use a condom.  Seat belts- Seat belts can save your life; always wear one.  Smoke/Carbon Monoxide detectors- These detectors need to be installed on the appropriate level of your home.  Replace batteries at least once a year.  Skin cancer- When out in the sun, cover up and use sunscreen 15 SPF or higher.  Violence- If anyone is threatening you, please tell your healthcare provider.  Living Will/ Health care power of attorney- Speak with your healthcare provider and family.   If you have lab work done today you will be contacted with your lab results within the next 2 weeks.  If you have not heard from [place] then please contact us. The fastest way to get your results is to register for My Chart.   IF you received an x-ray today, you will receive an invoice from [facility]. Please contact [facility] at [phone] with questions or concerns regarding your invoice.   IF you received labwork today, you will receive an invoice from [facility]. Please contact [facility] at [phone] with questions or concerns regarding your invoice.   Our billing staff will not be able to assist you with questions regarding bills from these companies.  You will be contacted with the lab results as soon as they are available. The fastest way to get your results is to activate your My Chart  account. Instructions are located on the last page of this paperwork. If you have not heard from [place] regarding the results in 2 weeks, please contact this office.          Signed, [name], MD [facility]

## 2019-12-15 NOTE — Patient Instructions (Addendum)
Start Trulicity once per week.  Continue Metformin.  Follow-up in 3 months for repeat testing.  Let me know if there are questions prior to that time.  Get covid vaccine as soon as possible.  [email or web address]  Keeping you healthy  Get these tests  Blood pressure- Have your blood pressure checked once a year by your healthcare provider.  Normal blood pressure is 120/80  Weight- Have your body mass index (BMI) calculated to screen for obesity.  BMI is a measure of body fat based on height and weight. You can also calculate your own BMI at [email or web address].  Cholesterol- Have your cholesterol checked every year.  Diabetes- Have your blood sugar checked regularly if you have high blood pressure, high cholesterol, have a family history of diabetes or if you are overweight.  Screening for Colon Cancer- Colonoscopy starting at age 59.  Screening may begin sooner depending on your family history and other health conditions. Follow up colonoscopy as directed by your Gastroenterologist.  Screening for Prostate Cancer- Both blood work (PSA) and a rectal exam help screen for Prostate Cancer.  Screening begins at age 35 with African-American men and at age 22 with Caucasian men.  Screening may begin sooner depending on your family history.  Take these medicines  Aspirin- One aspirin daily can help prevent Heart disease and Stroke.  Flu shot- Every fall.  Tetanus- Every 10 years.  Zostavax- Once after the age of 11 to prevent Shingles.  Pneumonia shot- Once after the age of 37; if you are younger than 42, ask your healthcare provider if you need a Pneumonia shot.  Take these steps  Don't smoke- If you do smoke, talk to your doctor about quitting.  For tips on how to quit, go to www.smokefree.gov or call 1-800-QUIT-NOW.  Be physically active- Exercise 5 days a week for at least 30 minutes.  If you are not already physically  active start slow and gradually work up to 30 minutes of moderate physical activity.  Examples of moderate activity include walking briskly, mowing the yard, dancing, swimming, bicycling, etc.  Eat a healthy diet- Eat a variety of healthy food such as fruits, vegetables, low fat milk, low fat cheese, yogurt, lean meant, poultry, fish, beans, tofu, etc. For more information go to www.thenutritionsource.org  Drink alcohol in moderation- Limit alcohol intake to less than two drinks a day. Never drink and drive.  Dentist- Brush and floss twice daily; visit your dentist twice a year.  Depression- Your emotional health is as important as your physical health. If you're feeling down, or losing interest in things you would normally enjoy please talk to your healthcare provider.  Eye exam- Visit your eye doctor every year.  Safe sex- If you may be exposed to a sexually transmitted infection, use a condom.  Seat belts- Seat belts can save your life; always wear one.  Smoke/Carbon Monoxide detectors- These detectors need to be installed on the appropriate level of your home.  Replace batteries at least once a year.  Skin cancer- When out in the sun, cover up and use sunscreen 15 SPF or higher.  Violence- If anyone is threatening you, please tell your healthcare provider.  Living Will/ Health care power of attorney- Speak with your healthcare provider and family.   If you have lab work done today you will be contacted with your lab results within the next 2 weeks.  If you have not heard from [place] then please contact us. The fastest way to get your  results is to register for My Chart.   IF you received an x-ray today, you will receive an invoice from [facility]. Please contact [facility] at [phone] with questions or concerns regarding your invoice.   IF you received labwork today, you will receive an invoice from [facility]. Please contact [facility] at [phone] with questions  or concerns regarding your invoice.   Our billing staff will not be able to assist you with questions regarding bills from these companies.  You will be contacted with the lab results as soon as they are available. The fastest way to get your results is to activate your My Chart account. Instructions are located on the last page of this paperwork. If you have not heard from [place] regarding the results in 2 weeks, please contact this office.

## 2019-12-16 ENCOUNTER — Encounter: Payer: Self-pay | Admitting: Family Medicine

## 2020-03-10 ENCOUNTER — Other Ambulatory Visit: Payer: Self-pay | Admitting: Family Medicine

## 2020-03-10 DIAGNOSIS — E1165 Type 2 diabetes mellitus with hyperglycemia: Secondary | ICD-10-CM

## 2020-03-10 NOTE — Telephone Encounter (Signed)
Requested Prescriptions  Pending Prescriptions Disp Refills  . TRULICITY 1.76 HY/0.7PX SOPN [Pharmacy Med Name: TRULICITY 0.75MG /0.5ML SDP 0.5ML] 6 mL 0    Sig: ADMINISTER 0.75 MG UNDER THE SKIN 1 TIME A WEEK     Endocrinology:  Diabetes - GLP-1 Receptor Agonists Passed - 03/10/2020  8:08 AM      Passed - HBA1C is between 0 and 7.9 and within 180 days    Hgb A1c MFr Bld  Date Value Ref Range Status  12/14/2019 7.4 (H) 4.8 - 5.6 % Final    Comment:             Prediabetes: 5.7 - 6.4          Diabetes: >6.4          Glycemic control for adults with diabetes: <7.0          Passed - Valid encounter within last 6 months    Recent Outpatient Visits          2 months ago Annual physical exam   [facility], Ranell Patrick, MD   2 months ago Encounter for wellness examination in adult   [facility], Ranell Patrick, MD   4 months ago Fever, unspecified   [facility], Ranell Patrick, MD   5 months ago Type 2 diabetes mellitus without complication, without long-term current use of insulin [facility])   [facility], Ranell Patrick, MD   9 months ago Type 2 diabetes mellitus without complication, without long-term current use of insulin [facility])   [facility], Ranell Patrick, MD      Future Appointments            In 5 days [name], MD [facility] [place], [facility]

## 2020-03-15 ENCOUNTER — Encounter: Payer: Self-pay | Admitting: Family Medicine

## 2020-03-15 ENCOUNTER — Other Ambulatory Visit: Payer: Self-pay

## 2020-03-15 ENCOUNTER — Ambulatory Visit: Payer: 59 | Admitting: Family Medicine

## 2020-03-15 VITALS — BP 122/75 | HR 67 | Temp 97.6°F | Ht 71.0 in | Wt 171.0 lb

## 2020-03-15 DIAGNOSIS — E1165 Type 2 diabetes mellitus with hyperglycemia: Secondary | ICD-10-CM

## 2020-03-15 DIAGNOSIS — E119 Type 2 diabetes mellitus without complications: Secondary | ICD-10-CM

## 2020-03-15 DIAGNOSIS — I10 Essential (primary) hypertension: Secondary | ICD-10-CM

## 2020-03-15 DIAGNOSIS — Z1159 Encounter for screening for other viral diseases: Secondary | ICD-10-CM | POA: Diagnosis not present

## 2020-03-15 DIAGNOSIS — E785 Hyperlipidemia, unspecified: Secondary | ICD-10-CM | POA: Diagnosis not present

## 2020-03-15 MED ORDER — TRULICITY 0.75 MG/0.5ML ~~LOC~~ SOAJ: SUBCUTANEOUS | 2 refills | Status: DC

## 2020-03-15 MED ORDER — METFORMIN HCL 1000 MG PO TABS: ORAL_TABLET | ORAL | 1 refills | Status: DC

## 2020-03-15 MED ORDER — GLUCOSE BLOOD VI STRP: ORAL_STRIP | 4 refills | Status: DC

## 2020-03-15 NOTE — Progress Notes (Signed)
Subjective:  Patient ID: Marcus Hart, male    DOB: 09/30/1965  Age: 54 y.o. MRN: [id number]  CC:  Chief Complaint  Patient presents with  . Diabetes    Pt reports no issues with this condition. pt checks his BS daily states it has stayed below 120 while fasting and sometimes gos above 120 when he isn't fasting. PT reports he likes the Trulicity medication he was put on last OV. pt states it has made hime feel better. pt reports watching his sugar intake the best he can.Pt would like a prescription for lancets and Glucose machine to check his BS at home.    HPI Marcus Hart presents for   Diabetes: Complicated by hyperglycemia Metformin 1000mg  BID.  Did not tolerate [place].  Started trulicity - 0.75mg  qweek Fasting under 120.  2hr postprandial - 180.  No symptomatic lows.  Feels better on trulicity   Microalbumin: normal ratio in April.  Optho, foot exam, pneumovax:  covid vaccine - 2nd dose last month  Lab Results  Component Value Date   HGBA1C 7.2 (H) 03/15/2020   HGBA1C 7.4 (H) 12/14/2019   HGBA1C 6.8 (A) 09/14/2019   Lab Results  Component Value Date   MICROALBUR <0.2 04/06/2015   LDLCALC 74 03/15/2020   CREATININE 0.70 (L) 03/15/2020    Hyperlipidemia: crestor 10mg  qd. No new myalgias or side effects.   Lab Results  Component Value Date   CHOL 157 03/15/2020   HDL 67 03/15/2020   LDLCALC 74 03/15/2020   TRIG 86 03/15/2020   CHOLHDL 2.3 03/15/2020   Lab Results  Component Value Date   ALT 14 03/15/2020   AST 16 03/15/2020   ALKPHOS 50 03/15/2020   BILITOT 0.3 03/15/2020       History Patient Active Problem List   Diagnosis Date Noted  . DM2 (diabetes mellitus, type 2) ([place]) 02/13/2013  . Other and unspecified hyperlipidemia 02/13/2013   Past Medical History:  Diagnosis Date  . Diabetes mellitus ([place])   . Diabetes mellitus without complication ([place])    Phreesia 11/10/2019  . Hyperlipidemia    Past Surgical History:  Procedure  Laterality Date  . NO PAST SURGERIES     Allergies  Allergen Reactions  . Other    Prior to Admission medications   Medication Sig Start Date End Date Taking? Authorizing Provider  aspirin 81 MG tablet Take 81 mg by mouth daily.   Yes [provider]  glucose blood test strip Use as instructed 12/15/19  Yes [name], MD  metFORMIN (GLUCOPHAGE) 1000 MG tablet TAKE 1 TABLET BY MOUTH TWICE DAILY WITH A MEAL 12/09/19  Yes [name], MD  rosuvastatin (CRESTOR) 10 MG tablet TAKE 1 TABLET(10 MG) BY MOUTH DAILY 12/07/19  Yes [name], MD  TRULICITY 6.96 EX/5.2WU SOPN ADMINISTER 0.75 MG UNDER THE SKIN 1 TIME A WEEK 03/10/20  Yes [name], MD   Social History   Socioeconomic History  . Marital status: Married    Spouse name: [name]  . Number of children: 4  . Years of education: Not on file  . Highest education level: Not on file  Occupational History  . Occupation: Mining engineer  Tobacco Use  . Smoking status: Never Smoker  . Smokeless tobacco: Never Used  Vaping Use  . Vaping Use: Never used  Substance and Sexual Activity  . Alcohol use: Never    Alcohol/week: 0.0 standard drinks  . Drug use: No  . Sexual activity: Yes  Other Topics Concern  . Not on file  Social History Narrative   Married   Education: Secretary/administrator   Exercise: No   Social Determinants of Radio broadcast assistant Strain:   . Difficulty of Paying Living Expenses: Not on file  Food Insecurity:   . Worried About Charity fundraiser in the Last Year: Not on file  . Ran Out of Food in the Last Year: Not on file  Transportation Needs:   . Lack of Transportation (Medical): Not on file  . Lack of Transportation (Non-Medical): Not on file  Physical Activity:   . Days of Exercise per Week: Not on file  . Minutes of Exercise per Session: Not on file  Stress:   . Feeling of Stress : Not on file  Social Connections:   . Frequency of Communication with Friends and Family: Not on  file  . Frequency of Social Gatherings with Friends and Family: Not on file  . Attends Religious Services: Not on file  . Active Member of Clubs or Organizations: Not on file  . Attends Archivist Meetings: Not on file  . Marital Status: Not on file  Intimate Partner Violence:   . Fear of Current or Ex-Partner: Not on file  . Emotionally Abused: Not on file  . Physically Abused: Not on file  . Sexually Abused: Not on file    Review of Systems  Constitutional: Negative for fatigue and unexpected weight change.  Eyes: Negative for visual disturbance.  Respiratory: Negative for cough, chest tightness and shortness of breath.   Cardiovascular: Negative for chest pain, palpitations and leg swelling.  Gastrointestinal: Negative for abdominal pain and blood in stool.  Neurological: Negative for dizziness, light-headedness and headaches.     Objective:   Vitals:   03/15/20 1513  BP: 122/75  Pulse: 67  Temp: 97.6 F (36.4 C)  TempSrc: Temporal  SpO2: 100%  Weight: 171 lb (77.6 kg)  Height: 5\' 11"  (1.803 m)     Physical Exam Vitals reviewed.  Constitutional:      Appearance: He is well-developed.  HENT:     Head: Normocephalic and atraumatic.  Eyes:     Pupils: Pupils are equal, round, and reactive to light.  Neck:     Vascular: No carotid bruit or JVD.  Cardiovascular:     Rate and Rhythm: Normal rate and regular rhythm.     Heart sounds: Normal heart sounds. No murmur heard.   Pulmonary:     Effort: Pulmonary effort is normal.     Breath sounds: Normal breath sounds. No rales.  Skin:    General: Skin is warm and dry.  Neurological:     Mental Status: He is alert and oriented to person, place, and time.        Assessment & Plan:  Marcus Hart is a 54 y.o. male . Type 2 diabetes mellitus with hyperglycemia, without long-term current use of insulin (HCC) - Plan: Dulaglutide (TRULICITY) 0.25 [place]/7.0WC SOPN, glucose blood test strip, Hemoglobin  A1c Type 2 diabetes mellitus without complication, without long-term current use of insulin ([place]) - Plan: metFORMIN (GLUCOPHAGE) 1000 MG tablet, Hemoglobin A1c  -Tolerating new regimen with Trulicity, continued on Metformin.  Check A1c, no changes for now.  Need for hepatitis C screening test - Plan: Hepatitis C antibody  Hyperlipidemia, unspecified hyperlipidemia type - Plan: Comprehensive metabolic panel, Lipid panel  Check labs, tolerating current regimen.  Essential hypertension, benign - Plan: Comprehensive metabolic panel, Lipid panel  -  Stable, continue same meds, check labs.  Meds ordered this encounter  Medications  . Dulaglutide (TRULICITY) 2.33 AQ/7.6AU SOPN    Sig: ADMINISTER 0.75 MG UNDER THE SKIN 1 TIME A WEEK    Dispense:  6 mL    Refill:  2    ZERO refills remain on this prescription. Your patient is requesting advance approval of refills for this medication to [place]  . glucose blood test strip    Sig: Use as instructed    Dispense:  100 each    Refill:  4  . metFORMIN (GLUCOPHAGE) 1000 MG tablet    Sig: Take 1 tab by mouth 2 time daily with a meal.    Dispense:  180 tablet    Refill:  1   Patient Instructions     No med changes today. Glad to hear your new med is doing well.    If you have lab work done today you will be contacted with your lab results within the next 2 weeks.  If you have not heard from [place] then please contact us. The fastest way to get your results is to register for My Chart.   IF you received an x-ray today, you will receive an invoice from [facility]. Please contact [facility] at [phone] with questions or concerns regarding your invoice.   IF you received labwork today, you will receive an invoice from [place]. Please contact [facility] at [phone] with questions or concerns regarding your invoice.   Our billing staff will not be able to assist you with questions regarding bills from these  companies.  You will be contacted with the lab results as soon as they are available. The fastest way to get your results is to activate your My Chart account. Instructions are located on the last page of this paperwork. If you have not heard from [place] regarding the results in 2 weeks, please contact this office.         Signed, [name], MD [facility]

## 2020-03-15 NOTE — Patient Instructions (Addendum)
   No med changes today. Glad to hear your new med is doing well.    If you have lab work done today you will be contacted with your lab results within the next 2 weeks.  If you have not heard from [place] then please contact us. The fastest way to get your results is to register for My Chart.   IF you received an x-ray today, you will receive an invoice from [facility]. Please contact [facility] at [phone] with questions or concerns regarding your invoice.   IF you received labwork today, you will receive an invoice from [place]. Please contact [facility] at [phone] with questions or concerns regarding your invoice.   Our billing staff will not be able to assist you with questions regarding bills from these companies.  You will be contacted with the lab results as soon as they are available. The fastest way to get your results is to activate your My Chart account. Instructions are located on the last page of this paperwork. If you have not heard from [place] regarding the results in 2 weeks, please contact this office.

## 2020-03-16 LAB — COMPREHENSIVE METABOLIC PANEL
ALT: 14 IU/L (ref 0–44)
AST: 16 IU/L (ref 0–40)
Albumin/Globulin Ratio: 1.8 (ref 1.2–2.2)
Albumin: 4.6 g/dL (ref 3.8–4.9)
Alkaline Phosphatase: 50 IU/L (ref 44–121)
BUN/Creatinine Ratio: 20 (ref 9–20)
BUN: 14 mg/dL (ref 6–24)
Bilirubin Total: 0.3 mg/dL (ref 0.0–1.2)
CO2: 23 mmol/L (ref 20–29)
Calcium: 9.4 mg/dL (ref 8.7–10.2)
Chloride: 102 mmol/L (ref 96–106)
Creatinine, Ser: 0.7 mg/dL — ABNORMAL LOW (ref 0.76–1.27)
GFR calc Af Amer: 125 mL/min/{1.73_m2} (ref >59)
GFR calc non Af Amer: 108 mL/min/{1.73_m2} (ref >59)
Globulin, Total: 2.6 g/dL (ref 1.5–4.5)
Glucose: 100 mg/dL — ABNORMAL HIGH (ref 65–99)
Potassium: 4.2 mmol/L (ref 3.5–5.2)
Sodium: 138 mmol/L (ref 134–144)
Total Protein: 7.2 g/dL (ref 6.0–8.5)

## 2020-03-16 LAB — LIPID PANEL
Chol/HDL Ratio: 2.3 ratio (ref 0.0–5.0)
Cholesterol, Total: 157 mg/dL (ref 100–199)
HDL: 67 mg/dL (ref >39)
LDL Chol Calc (NIH): 74 mg/dL (ref 0–99)
Triglycerides: 86 mg/dL (ref 0–149)
VLDL Cholesterol Cal: 16 mg/dL (ref 5–40)

## 2020-03-16 LAB — HEMOGLOBIN A1C
Est. average glucose Bld gHb Est-mCnc: 160 mg/dL
Hgb A1c MFr Bld: 7.2 % — ABNORMAL HIGH (ref 4.8–5.6)

## 2020-03-16 LAB — HEPATITIS C ANTIBODY: Hep C Virus Ab: <0.1 s/co ratio (ref 0.0–0.9)

## 2020-03-18 ENCOUNTER — Encounter: Payer: Self-pay | Admitting: Family Medicine

## 2020-05-24 LAB — HM DIABETES EYE EXAM

## 2020-06-11 ENCOUNTER — Other Ambulatory Visit: Payer: Self-pay | Admitting: Family Medicine

## 2020-06-11 DIAGNOSIS — E785 Hyperlipidemia, unspecified: Secondary | ICD-10-CM

## 2020-06-21 ENCOUNTER — Encounter: Payer: Self-pay | Admitting: Family Medicine

## 2020-06-21 ENCOUNTER — Other Ambulatory Visit: Payer: Self-pay

## 2020-06-21 ENCOUNTER — Ambulatory Visit (INDEPENDENT_AMBULATORY_CARE_PROVIDER_SITE_OTHER): Payer: 59 | Admitting: Family Medicine

## 2020-06-21 DIAGNOSIS — E785 Hyperlipidemia, unspecified: Secondary | ICD-10-CM | POA: Diagnosis not present

## 2020-06-21 DIAGNOSIS — E1165 Type 2 diabetes mellitus with hyperglycemia: Secondary | ICD-10-CM | POA: Diagnosis not present

## 2020-06-21 MED ORDER — TRULICITY 0.75 MG/0.5ML ~~LOC~~ SOAJ: SUBCUTANEOUS | 2 refills | Status: DC

## 2020-06-21 MED ORDER — METFORMIN HCL 1000 MG PO TABS: ORAL_TABLET | ORAL | 2 refills | Status: DC

## 2020-06-21 MED ORDER — ROSUVASTATIN CALCIUM 10 MG PO TABS: ORAL_TABLET | ORAL | 2 refills | Status: DC

## 2020-06-21 NOTE — Patient Instructions (Addendum)
Depending on A1c, we may need to increase dose of trulicity. I will let you know.   Thanks for coming in today and take care.   If you have lab work done today you will be contacted with your lab results within the next 2 weeks.  If you have not heard from [place] then please contact us. The fastest way to get your results is to register for My Chart.   IF you received an x-ray today, you will receive an invoice from [facility]. Please contact [facility] at [phone] with questions or concerns regarding your invoice.   IF you received labwork today, you will receive an invoice from [place]. Please contact [facility] at [phone] with questions or concerns regarding your invoice.   Our billing staff will not be able to assist you with questions regarding bills from these companies.  You will be contacted with the lab results as soon as they are available. The fastest way to get your results is to activate your My Chart account. Instructions are located on the last page of this paperwork. If you have not heard from [place] regarding the results in 2 weeks, please contact this office.

## 2020-06-21 NOTE — Progress Notes (Signed)
Subjective:  Patient ID: Marcus Hart, male    DOB: 02-15-1966  Age: 55 y.o. MRN: [id number]  CC:  Chief Complaint  Patient presents with  . Follow-up    On diabetes. Pt reports no issues with BS since last OV. PT states he checks his BS at home and his fasting BS is around 125 mg/dl and his non fasting is around 180 mg/dl. PT reports he missed a week of his medication due to leaving it home while on vacation last month.    HPI Marcus Hart presents for   Diabetes: With hyperglycemia. Borderline control in October with Metformin 1000 mg twice daily. Did not tolerate Januvia. Had started Trulicity 0.75 mg weekly. He is on statin. No med changes, plans on continue diet/exercise improvements. Wt Readings from Last 3 Encounters:  06/21/20 171 lb (77.6 kg)  03/15/20 171 lb (77.6 kg)  12/15/19 171 lb 12.8 oz (77.9 kg)   On vacation last month for 10 days - off meds. Otherwise taking daily.  Home readings: fasting 120, 2hr pp = 180-200.  No symptomatic lows.  Feels well - better since taking trulicity. No other concerns today.  Microalbumin: nl ratio 08/2019 Optho, foot exam, pneumovax: up to date.   Lab Results  Component Value Date   HGBA1C 7.2 (H) 03/15/2020   HGBA1C 7.4 (H) 12/14/2019   HGBA1C 6.8 (A) 09/14/2019   Lab Results  Component Value Date   MICROALBUR <0.2 04/06/2015   LDLCALC 74 03/15/2020   CREATININE 0.70 (L) 03/15/2020   Hyperlipidemia: crestor 10mg  qd. No new side effects.  Lab Results  Component Value Date   CHOL 157 03/15/2020   HDL 67 03/15/2020   LDLCALC 74 03/15/2020   TRIG 86 03/15/2020   CHOLHDL 2.3 03/15/2020   Lab Results  Component Value Date   ALT 14 03/15/2020   AST 16 03/15/2020   ALKPHOS 50 03/15/2020   BILITOT 0.3 03/15/2020     History Patient Active Problem List   Diagnosis Date Noted  . DM2 (diabetes mellitus, type 2) (HCC) 02/13/2013  . Other and unspecified hyperlipidemia 02/13/2013   Past Medical History:  Diagnosis  Date  . Diabetes mellitus (HCC)   . Diabetes mellitus without complication (HCC)    Phreesia 11/10/2019  . Hyperlipidemia    Past Surgical History:  Procedure Laterality Date  . NO PAST SURGERIES     Allergies  Allergen Reactions  . Other    Prior to Admission medications   Medication Sig Start Date End Date Taking? Authorizing Provider  aspirin 81 MG tablet Take 81 mg by mouth daily.   Yes [provider]  Dulaglutide (TRULICITY) 0.75 MG/0.5ML SOPN ADMINISTER 0.75 MG UNDER THE SKIN 1 TIME A WEEK 03/15/20  Yes 03/17/20, MD  glucose blood test strip Use as instructed 03/15/20  Yes 03/17/20, MD  metFORMIN (GLUCOPHAGE) 1000 MG tablet Take 1 tab by mouth 2 time daily with a meal. 03/15/20  Yes 03/17/20, MD  rosuvastatin (CRESTOR) 10 MG tablet TAKE 1 TABLET(10 MG) BY MOUTH DAILY 12/07/19  Yes 12/09/19, MD   Social History   Socioeconomic History  . Marital status: Married    Spouse name: [name]  . Number of children: 4  . Years of education: Not on file  . Highest education level: Not on file  Occupational History  . Occupation: [employer]  Tobacco Use  . Smoking status: Never Smoker  . Smokeless tobacco: Never Used  Vaping Use  .  Vaping Use: Never used  Substance and Sexual Activity  . Alcohol use: Never    Alcohol/week: 0.0 standard drinks  . Drug use: No  . Sexual activity: Yes  Other Topics Concern  . Not on file  Social History Narrative   Married   Education: Secretary/administrator   Exercise: No   Social Determinants of Radio broadcast assistant Strain: Not on file  Food Insecurity: Not on file  Transportation Needs: Not on file  Physical Activity: Not on file  Stress: Not on file  Social Connections: Not on file  Intimate Partner Violence: Not on file    Review of Systems  Constitutional: Negative for fatigue and unexpected weight change.  Eyes: Negative for visual disturbance.  Respiratory: Negative for cough, chest  tightness and shortness of breath.   Cardiovascular: Negative for chest pain, palpitations and leg swelling.  Gastrointestinal: Negative for abdominal pain and blood in stool.  Neurological: Negative for dizziness, light-headedness and headaches.  All other systems reviewed and are negative.    Objective:   Vitals:   06/21/20 1514  BP: 137/81  Pulse: 64  Temp: 98.2 F (36.8 C)  TempSrc: Temporal  SpO2: 100%  Weight: 171 lb (77.6 kg)  Height: 5\' 11"  (1.803 m)     Physical Exam Vitals reviewed.  Constitutional:      Appearance: He is well-developed and well-nourished.  HENT:     Head: Normocephalic and atraumatic.  Eyes:     Extraocular Movements: EOM normal.     Pupils: Pupils are equal, round, and reactive to light.  Neck:     Vascular: No carotid bruit or JVD.  Cardiovascular:     Rate and Rhythm: Normal rate and regular rhythm.     Heart sounds: Normal heart sounds. No murmur heard.   Pulmonary:     Effort: Pulmonary effort is normal.     Breath sounds: Normal breath sounds. No rales.  Musculoskeletal:        General: No edema.  Skin:    General: Skin is warm and dry.  Neurological:     Mental Status: He is alert and oriented to person, place, and time.  Psychiatric:        Mood and Affect: Mood and affect normal.        Assessment & Plan:  Marcus Hart is a 55 y.o. male . Type 2 diabetes mellitus with hyperglycemia, without long-term current use of insulin (HCC) - Plan: Dulaglutide (TRULICITY) 1.32 GM/0.1UU SOPN, metFORMIN (GLUCOPHAGE) 1000 MG tablet, Hemoglobin A1c  -Feels well with current regimen.  1 week off meds may impact A1c slightly.  Still some elevated postprandials past few weeks.  May need to increase dose of Trulicity but will check A1c first.  Continue same dose of Metformin.  Hyperlipidemia, unspecified hyperlipidemia type - Plan: rosuvastatin (CRESTOR) 10 MG tablet  -Tolerating current dose, continue same.  Lipid panel next  visit  Meds ordered this encounter  Medications  . Dulaglutide (TRULICITY) 7.25 [place]/6.4QI SOPN    Sig: ADMINISTER 0.75 MG UNDER THE SKIN 1 TIME A WEEK    Dispense:  6 mL    Refill:  2  . rosuvastatin (CRESTOR) 10 MG tablet    Sig: TAKE 1 TABLET(10 MG) BY MOUTH DAILY    Dispense:  90 tablet    Refill:  2  . metFORMIN (GLUCOPHAGE) 1000 MG tablet    Sig: Take 1 tab by mouth 2 time daily with a meal.    Dispense:  180  tablet    Refill:  2   Patient Instructions   Depending on A1c, we may need to increase dose of trulicity. I will let you know.   Thanks for coming in today and take care.   If you have lab work done today you will be contacted with your lab results within the next 2 weeks.  If you have not heard from [place] then please contact us. The fastest way to get your results is to register for My Chart.   IF you received an x-ray today, you will receive an invoice from [facility]. Please contact [facility] at [phone] with questions or concerns regarding your invoice.   IF you received labwork today, you will receive an invoice from [place]. Please contact [facility] at [phone] with questions or concerns regarding your invoice.   Our billing staff will not be able to assist you with questions regarding bills from these companies.  You will be contacted with the lab results as soon as they are available. The fastest way to get your results is to activate your My Chart account. Instructions are located on the last page of this paperwork. If you have not heard from [place] regarding the results in 2 weeks, please contact this office.         Signed, [name], MD [facility]

## 2020-06-22 LAB — HEMOGLOBIN A1C
Est. average glucose Bld gHb Est-mCnc: 174 mg/dL
Hgb A1c MFr Bld: 7.7 % — ABNORMAL HIGH (ref 4.8–5.6)

## 2020-06-24 ENCOUNTER — Encounter: Payer: Self-pay | Admitting: *Deleted

## 2020-06-30 ENCOUNTER — Other Ambulatory Visit: Payer: Self-pay | Admitting: Family Medicine

## 2020-06-30 DIAGNOSIS — E1165 Type 2 diabetes mellitus with hyperglycemia: Secondary | ICD-10-CM

## 2020-06-30 MED ORDER — TRULICITY 1.5 MG/0.5ML ~~LOC~~ SOAJ: 1.5000 mg | SUBCUTANEOUS | 2 refills | Status: DC

## 2020-09-19 ENCOUNTER — Other Ambulatory Visit: Payer: Self-pay

## 2020-09-19 ENCOUNTER — Encounter: Payer: Self-pay | Admitting: Family Medicine

## 2020-09-19 ENCOUNTER — Ambulatory Visit (INDEPENDENT_AMBULATORY_CARE_PROVIDER_SITE_OTHER): Payer: 59 | Admitting: Family Medicine

## 2020-09-19 VITALS — BP 122/78 | HR 59 | Temp 98.4°F | Resp 17 | Ht 71.0 in | Wt 166.4 lb

## 2020-09-19 DIAGNOSIS — R1013 Epigastric pain: Secondary | ICD-10-CM | POA: Diagnosis not present

## 2020-09-19 DIAGNOSIS — E785 Hyperlipidemia, unspecified: Secondary | ICD-10-CM

## 2020-09-19 DIAGNOSIS — E1165 Type 2 diabetes mellitus with hyperglycemia: Secondary | ICD-10-CM | POA: Diagnosis not present

## 2020-09-19 MED ORDER — FAMOTIDINE 20 MG PO TABS: 20.0000 mg | ORAL_TABLET | Freq: Two times a day (BID) | ORAL | 1 refills | Status: DC

## 2020-09-19 NOTE — Progress Notes (Signed)
Subjective:  Patient ID: Marcus Hart, male    DOB: 1965-08-05  Age: 55 y.o. MRN: [id number]  CC:  Chief Complaint  Patient presents with  . Referral    Pt has been having some possible reflux or indigestion and is requesting he be referred to GI for this concern, has been going on for several months on and off with sour and spicy foods.   . Diabetes    Pt following up as directed, no concerns feeling well, pt does need refills while in office today     HPI Marcus Hart presents for   Diabetes: With hyperglycemia, uncontrolled.  Now on trulicity 1.5mg  q week. Metformin 1000mg  bid, no new side effects with meds.  On crestor for HLD - no new myalgias.  Fasting blood sugar  110-120s, postprandial 180. No 200's, no sx lows.  Microalbumin: due. Normal in 08/2019 Optho, foot exam, pneumovax: utd  Lab Results  Component Value Date   HGBA1C 7.7 (H) 06/21/2020   HGBA1C 7.2 (H) 03/15/2020   HGBA1C 7.4 (H) 12/14/2019   Lab Results  Component Value Date   MICROALBUR <0.2 04/06/2015   LDLCALC 74 03/15/2020   CREATININE 0.70 (L) 03/15/2020    Heartburn: Notes with spicy food, sour/acidic foods. Upset stomach, no melena/hematochezia. No f/c/n/v. No diarrhea.  Drinks coffee - same issue. Cut back on coffee.  No otc treatments.  Would like to meet with specialist   Concerned about neighbors that have liver cancer, pancreas CA. No abd pain. Wondering if he needs CT scan. No n/v/recurrent abd pain, no alcohol.   History Patient Active Problem List   Diagnosis Date Noted  . DM2 (diabetes mellitus, type 2) ([place]) 02/13/2013  . Other and unspecified hyperlipidemia 02/13/2013   Past Medical History:  Diagnosis Date  . Diabetes mellitus ([place])   . Diabetes mellitus without complication ([place])    Phreesia 11/10/2019  . Hyperlipidemia    Past Surgical History:  Procedure Laterality Date  . NO PAST SURGERIES     Allergies  Allergen Reactions  . Other    Prior to Admission  medications   Medication Sig Start Date End Date Taking? Authorizing Provider  aspirin 81 MG tablet Take 81 mg by mouth daily.   Yes [provider]  Dulaglutide (TRULICITY) A999333 0000000 SOPN ADMINISTER 0.75 MG UNDER THE SKIN 1 TIME A WEEK 06/21/20  Yes [name], MD  Dulaglutide (TRULICITY) 1.5 0000000 SOPN Inject 1.5 mg into the skin once a week. 06/30/20  Yes [name], MD  glucose blood test strip Use as instructed 03/15/20  Yes [name], MD  metFORMIN (GLUCOPHAGE) 1000 MG tablet Take 1 tab by mouth 2 time daily with a meal. 06/21/20  Yes [name], MD  rosuvastatin (CRESTOR) 10 MG tablet TAKE 1 TABLET(10 MG) BY MOUTH DAILY 06/21/20  Yes [name], MD   Social History   Socioeconomic History  . Marital status: Married    Spouse name: [name]  . Number of children: 4  . Years of education: Not on file  . Highest education level: Not on file  Occupational History  . Occupation: Mining engineer  Tobacco Use  . Smoking status: Never Smoker  . Smokeless tobacco: Never Used  Vaping Use  . Vaping Use: Never used  Substance and Sexual Activity  . Alcohol use: Never    Alcohol/week: 0.0 standard drinks  . Drug use: No  . Sexual activity: Yes  Other Topics Concern  . Not  on file  Social History Narrative   Married   Education: Secretary/administrator   Exercise: No   Social Determinants of Radio broadcast assistant Strain: Not on file  Food Insecurity: Not on file  Transportation Needs: Not on file  Physical Activity: Not on file  Stress: Not on file  Social Connections: Not on file  Intimate Partner Violence: Not on file    Review of Systems  Constitutional: Negative for fatigue and unexpected weight change.  Eyes: Negative for visual disturbance.  Respiratory: Negative for cough, chest tightness and shortness of breath.   Cardiovascular: Negative for chest pain, palpitations and leg swelling.  Gastrointestinal: Negative for abdominal pain and  blood in stool.  Neurological: Negative for dizziness, light-headedness and headaches.     Objective:   Vitals:   09/19/20 1517  BP: 122/78  Pulse: (!) 59  Resp: 17  Temp: 98.4 F (36.9 C)  TempSrc: Temporal  SpO2: 99%  Weight: 166 lb 6.4 oz (75.5 kg)  Height: 5\' 11"  (1.803 m)     Physical Exam Vitals reviewed.  Constitutional:      Appearance: He is well-developed.  HENT:     Head: Normocephalic and atraumatic.  Eyes:     Pupils: Pupils are equal, round, and reactive to light.  Neck:     Vascular: No carotid bruit or JVD.  Cardiovascular:     Rate and Rhythm: Normal rate and regular rhythm.     Heart sounds: Normal heart sounds. No murmur heard.   Pulmonary:     Effort: Pulmonary effort is normal.     Breath sounds: Normal breath sounds. No rales.  Abdominal:     General: Abdomen is flat. There is no distension.     Palpations: There is no mass.     Tenderness: There is no abdominal tenderness. There is no guarding or rebound.     Hernia: No hernia is present.  Skin:    General: Skin is warm and dry.  Neurological:     Mental Status: He is alert and oriented to person, place, and time.  Psychiatric:        Mood and Affect: Mood normal.        Behavior: Behavior normal.      Assessment & Plan:  Marcus Hart is a 55 y.o. male . Dyspepsia - Plan: famotidine (PEPCID) 20 MG tablet, Ambulatory referral to Gastroenterology  -GERD/gastritis likely based on food/triggers.  Handout given on trigger avoidance.  Pepcid twice per day as needed for now, refer to gastroenterology to discuss further.  Also can discuss concerns with liver/pancreatic cancer screening with gastroenterology but asymptomatic, abdomen exam reassuring in office.  Type 2 diabetes mellitus with hyperglycemia, without long-term current use of insulin (HCC) - Plan: Microalbumin / creatinine urine ratio, Hemoglobin A1c  -Uncontrolled with hyperglycemia, anticipate improved readings, tolerating  higher dose of Trulicity, check labs.  Continue same dose metformin, Trulicity for now.  Hyperlipidemia, unspecified hyperlipidemia type - Plan: Lipid panel, Comprehensive metabolic panel  -Tolerating statin, continue same, check labs  Meds ordered this encounter  Medications  . famotidine (PEPCID) 20 MG tablet    Sig: Take 1 tablet (20 mg total) by mouth 2 (two) times daily.    Dispense:  60 tablet    Refill:  1   Patient Instructions   pepcid twice per day if needed for heartburn. If controlled, can use as needed. See information below about foods to avoid. Return to the clinic or go to the  nearest emergency room if any of your symptoms worsen or new symptoms occur.   Food Choices for Gastroesophageal Reflux Disease, Adult When you have gastroesophageal reflux disease (GERD), the foods you eat and your eating habits are very important. Choosing the right foods can help ease the discomfort of GERD. Consider working with a dietitian to help you make healthy food choices. What are tips for following this plan? Reading food labels  Look for foods that are low in saturated fat. Foods that have less than 5% of daily value (DV) of fat and 0 g of trans fats may help with your symptoms. Cooking  Cook foods using methods other than frying. This may include baking, steaming, grilling, or broiling. These are all methods that do not need a lot of fat for cooking.  To add flavor, try to use herbs that are low in spice and acidity. Meal planning  Choose healthy foods that are low in fat, such as fruits, vegetables, whole grains, low-fat dairy products, lean meats, fish, and poultry.  Eat frequent, small meals instead of three large meals each day. Eat your meals slowly, in a relaxed setting. Avoid bending over or lying down until 2-3 hours after eating.  Limit high-fat foods such as fatty meats or fried foods.  Limit your intake of fatty foods, such as oils, butter, and shortening.  Avoid  the following as told by your health care provider: ? Foods that cause symptoms. These may be different for different people. Keep a food diary to keep track of foods that cause symptoms. ? Alcohol. ? Drinking large amounts of liquid with meals. ? Eating meals during the 2-3 hours before bed.   Lifestyle  Maintain a healthy weight. Ask your health care provider what weight is healthy for you. If you need to lose weight, work with your health care provider to do so safely.  Exercise for at least 30 minutes on 5 or more days each week, or as told by your health care provider.  Avoid wearing clothes that fit tightly around your waist and chest.  Do not use any products that contain nicotine or tobacco. These products include cigarettes, chewing tobacco, and vaping devices, such as e-cigarettes. If you need help quitting, ask your health care provider.  Sleep with the head of your bed raised. Use a wedge under the mattress or blocks under the bed frame to raise the head of the bed.  Chew sugar-free gum after mealtimes. What foods should I eat? Eat a healthy, well-balanced diet of fruits, vegetables, whole grains, low-fat dairy products, lean meats, fish, and poultry. Each person is different. Foods that may trigger symptoms in one person may not trigger any symptoms in another person. Work with your health care provider to identify foods that are safe for you. The items listed above may not be a complete list of recommended foods and beverages. Contact a dietitian for more information.   What foods should I avoid? Limiting some of these foods may help manage the symptoms of GERD. Everyone is different. Consult a dietitian or your health care provider to help you identify the exact foods to avoid, if any. Fruits Any fruits prepared with added fat. Any fruits that cause symptoms. For some people this may include citrus fruits, such as oranges, grapefruit, pineapple, and  lemons. Vegetables Deep-fried vegetables. [place] fries. Any vegetables prepared with added fat. Any vegetables that cause symptoms. For some people, this may include tomatoes and tomato products, chili peppers, onions and  garlic, and horseradish. Grains Pastries or quick breads with added fat. Meats and other proteins High-fat meats, such as fatty beef or pork, hot dogs, ribs, ham, sausage, salami, and bacon. Fried meat or protein, including fried fish and fried chicken. Nuts and nut butters, in large amounts. Dairy Whole milk and chocolate milk. Sour cream. Cream. Ice cream. Cream cheese. Milkshakes. Fats and oils Butter. Margarine. Shortening. Ghee. Beverages Coffee and tea, with or without caffeine. Carbonated beverages. Sodas. Energy drinks. Fruit juice made with acidic fruits, such as orange or grapefruit. Tomato juice. Alcoholic drinks. Sweets and desserts Chocolate and cocoa. Donuts. Seasonings and condiments Pepper. Peppermint and spearmint. Added salt. Any condiments, herbs, or seasonings that cause symptoms. For some people, this may include curry, hot sauce, or vinegar-based salad dressings. The items listed above may not be a complete list of foods and beverages to avoid. Contact a dietitian for more information. Questions to ask your health care provider Diet and lifestyle changes are usually the first steps that are taken to manage symptoms of GERD. If diet and lifestyle changes do not improve your symptoms, talk with your health care provider about taking medicines. Where to find more information  International Foundation for Gastrointestinal Disorders: aboutgerd.org Summary  When you have gastroesophageal reflux disease (GERD), food and lifestyle choices may be very helpful in easing the discomfort of GERD.  Eat frequent, small meals instead of three large meals each day. Eat your meals slowly, in a relaxed setting. Avoid bending over or lying down until 2-3 hours after  eating.  Limit high-fat foods such as fatty meats or fried foods. This information is not intended to replace advice given to you by your health care provider. Make sure you discuss any questions you have with your health care provider. Document Revised: 11/13/2019 Document Reviewed: 11/13/2019 Elsevier Patient Education  2021 [place].      Signed, [name], MD [facility]

## 2020-09-19 NOTE — Patient Instructions (Addendum)
pepcid twice per day if needed for heartburn. If controlled, can use as needed. See information below about foods to avoid. Return to the clinic or go to the nearest emergency room if any of your symptoms worsen or new symptoms occur.  No other med changes for now.    Food Choices for Gastroesophageal Reflux Disease, Adult When you have gastroesophageal reflux disease (GERD), the foods you eat and your eating habits are very important. Choosing the right foods can help ease the discomfort of GERD. Consider working with a dietitian to help you make healthy food choices. What are tips for following this plan? Reading food labels  Look for foods that are low in saturated fat. Foods that have less than 5% of daily value (DV) of fat and 0 g of trans fats may help with your symptoms. Cooking  Cook foods using methods other than frying. This may include baking, steaming, grilling, or broiling. These are all methods that do not need a lot of fat for cooking.  To add flavor, try to use herbs that are low in spice and acidity. Meal planning  Choose healthy foods that are low in fat, such as fruits, vegetables, whole grains, low-fat dairy products, lean meats, fish, and poultry.  Eat frequent, small meals instead of three large meals each day. Eat your meals slowly, in a relaxed setting. Avoid bending over or lying down until 2-3 hours after eating.  Limit high-fat foods such as fatty meats or fried foods.  Limit your intake of fatty foods, such as oils, butter, and shortening.  Avoid the following as told by your health care provider: ? Foods that cause symptoms. These may be different for different people. Keep a food diary to keep track of foods that cause symptoms. ? Alcohol. ? Drinking large amounts of liquid with meals. ? Eating meals during the 2-3 hours before bed.   Lifestyle  Maintain a healthy weight. Ask your health care provider what weight is healthy for you. If you need to lose  weight, work with your health care provider to do so safely.  Exercise for at least 30 minutes on 5 or more days each week, or as told by your health care provider.  Avoid wearing clothes that fit tightly around your waist and chest.  Do not use any products that contain nicotine or tobacco. These products include cigarettes, chewing tobacco, and vaping devices, such as e-cigarettes. If you need help quitting, ask your health care provider.  Sleep with the head of your bed raised. Use a wedge under the mattress or blocks under the bed frame to raise the head of the bed.  Chew sugar-free gum after mealtimes. What foods should I eat? Eat a healthy, well-balanced diet of fruits, vegetables, whole grains, low-fat dairy products, lean meats, fish, and poultry. Each person is different. Foods that may trigger symptoms in one person may not trigger any symptoms in another person. Work with your health care provider to identify foods that are safe for you. The items listed above may not be a complete list of recommended foods and beverages. Contact a dietitian for more information.   What foods should I avoid? Limiting some of these foods may help manage the symptoms of GERD. Everyone is different. Consult a dietitian or your health care provider to help you identify the exact foods to avoid, if any. Fruits Any fruits prepared with added fat. Any fruits that cause symptoms. For some people this may include citrus fruits, such  as oranges, grapefruit, pineapple, and lemons. Vegetables Deep-fried vegetables. [place] fries. Any vegetables prepared with added fat. Any vegetables that cause symptoms. For some people, this may include tomatoes and tomato products, chili peppers, onions and garlic, and horseradish. Grains Pastries or quick breads with added fat. Meats and other proteins High-fat meats, such as fatty beef or pork, hot dogs, ribs, ham, sausage, salami, and bacon. Fried meat or protein, including  fried fish and fried chicken. Nuts and nut butters, in large amounts. Dairy Whole milk and chocolate milk. Sour cream. Cream. Ice cream. Cream cheese. Milkshakes. Fats and oils Butter. Margarine. Shortening. Ghee. Beverages Coffee and tea, with or without caffeine. Carbonated beverages. Sodas. Energy drinks. Fruit juice made with acidic fruits, such as orange or grapefruit. Tomato juice. Alcoholic drinks. Sweets and desserts Chocolate and cocoa. Donuts. Seasonings and condiments Pepper. Peppermint and spearmint. Added salt. Any condiments, herbs, or seasonings that cause symptoms. For some people, this may include curry, hot sauce, or vinegar-based salad dressings. The items listed above may not be a complete list of foods and beverages to avoid. Contact a dietitian for more information. Questions to ask your health care provider Diet and lifestyle changes are usually the first steps that are taken to manage symptoms of GERD. If diet and lifestyle changes do not improve your symptoms, talk with your health care provider about taking medicines. Where to find more information  International Foundation for Gastrointestinal Disorders: aboutgerd.org Summary  When you have gastroesophageal reflux disease (GERD), food and lifestyle choices may be very helpful in easing the discomfort of GERD.  Eat frequent, small meals instead of three large meals each day. Eat your meals slowly, in a relaxed setting. Avoid bending over or lying down until 2-3 hours after eating.  Limit high-fat foods such as fatty meats or fried foods. This information is not intended to replace advice given to you by your health care provider. Make sure you discuss any questions you have with your health care provider. Document Revised: 11/13/2019 Document Reviewed: 11/13/2019 Elsevier Patient Education  [place].

## 2020-09-20 LAB — COMPREHENSIVE METABOLIC PANEL
ALT: 13 U/L (ref 0–53)
AST: 17 U/L (ref 0–37)
Albumin: 4.4 g/dL (ref 3.5–5.2)
Alkaline Phosphatase: 45 U/L (ref 39–117)
BUN: 14 mg/dL (ref 6–23)
CO2: 30 mEq/L (ref 19–32)
Calcium: 9.7 mg/dL (ref 8.4–10.5)
Chloride: 103 mEq/L (ref 96–112)
Creatinine, Ser: 0.79 mg/dL (ref 0.40–1.50)
GFR: 101.14 mL/min (ref >60.00)
Glucose, Bld: 111 mg/dL — ABNORMAL HIGH (ref 70–99)
Potassium: 5 mEq/L (ref 3.5–5.1)
Sodium: 138 mEq/L (ref 135–145)
Total Bilirubin: 0.4 mg/dL (ref 0.2–1.2)
Total Protein: 7.2 g/dL (ref 6.0–8.3)

## 2020-09-20 LAB — LIPID PANEL
Cholesterol: 245 mg/dL — ABNORMAL HIGH (ref 0–200)
HDL: 57.7 mg/dL (ref >39.00)
LDL Cholesterol: 169 mg/dL — ABNORMAL HIGH (ref 0–99)
NonHDL: 187.15
Total CHOL/HDL Ratio: 4
Triglycerides: 90 mg/dL (ref 0.0–149.0)
VLDL: 18 mg/dL (ref 0.0–40.0)

## 2020-09-20 LAB — MICROALBUMIN / CREATININE URINE RATIO
Creatinine,U: 92.5 mg/dL
Microalb Creat Ratio: 0.8 mg/g (ref 0.0–30.0)
Microalb, Ur: <0.7 mg/dL (ref 0.0–1.9)

## 2020-09-20 LAB — HEMOGLOBIN A1C: Hgb A1c MFr Bld: 7.1 % — ABNORMAL HIGH (ref 4.6–6.5)

## 2020-11-05 ENCOUNTER — Encounter: Payer: Self-pay | Admitting: Gastroenterology

## 2020-12-12 ENCOUNTER — Encounter: Payer: Self-pay | Admitting: Gastroenterology

## 2020-12-12 ENCOUNTER — Ambulatory Visit (INDEPENDENT_AMBULATORY_CARE_PROVIDER_SITE_OTHER): Payer: 59 | Admitting: Gastroenterology

## 2020-12-12 ENCOUNTER — Other Ambulatory Visit (INDEPENDENT_AMBULATORY_CARE_PROVIDER_SITE_OTHER): Payer: 59

## 2020-12-12 VITALS — BP 130/70 | HR 62 | Ht 69.0 in | Wt 170.0 lb

## 2020-12-12 DIAGNOSIS — R1013 Epigastric pain: Secondary | ICD-10-CM

## 2020-12-12 LAB — CBC WITH DIFFERENTIAL/PLATELET
Basophils Absolute: 0 10*3/uL (ref 0.0–0.1)
Basophils Relative: 0.8 % (ref 0.0–3.0)
Eosinophils Absolute: 0.1 10*3/uL (ref 0.0–0.7)
Eosinophils Relative: 3.4 % (ref 0.0–5.0)
HCT: 40.5 % (ref 39.0–52.0)
Hemoglobin: 13.3 g/dL (ref 13.0–17.0)
Lymphocytes Relative: 42.7 % (ref 12.0–46.0)
Lymphs Abs: 1.2 10*3/uL (ref 0.7–4.0)
MCHC: 32.8 g/dL (ref 30.0–36.0)
MCV: 85.9 fl (ref 78.0–100.0)
Monocytes Absolute: 0.2 10*3/uL (ref 0.1–1.0)
Monocytes Relative: 7.9 % (ref 3.0–12.0)
Neutro Abs: 1.3 10*3/uL — ABNORMAL LOW (ref 1.4–7.7)
Neutrophils Relative %: 45.2 % (ref 43.0–77.0)
Platelets: 163 10*3/uL (ref 150.0–400.0)
RBC: 4.72 Mil/uL (ref 4.22–5.81)
RDW: 14.2 % (ref 11.5–15.5)
WBC: 2.9 10*3/uL — ABNORMAL LOW (ref 4.0–10.5)

## 2020-12-12 NOTE — Patient Instructions (Signed)
If you are age 54 or older, your body mass index should be between 23-30. Your Body mass index is 25.1 kg/m. If this is out of the aforementioned range listed, please consider follow up with your Primary Care Provider.  If you are age 73 or younger, your body mass index should be between 19-25. Your Body mass index is 25.1 kg/m. If this is out of the aformentioned range listed, please consider follow up with your Primary Care Provider.   __________________________________________________________  The [facility] providers would like to encourage you to use [facility] to communicate with providers for non-urgent requests or questions.  Due to long hold times on the telephone, sending your provider a message by [facility] may be a faster and more efficient way to get a response.  Please allow 48 business hours for a response.  Please remember that this is for non-urgent requests.   You have been scheduled for an endoscopy. Please follow written instructions given to you at your visit today. If you use inhalers (even only as needed), please bring them with you on the day of your procedure.  Your provider has requested that you go to the basement level for lab work before leaving today. Press "B" on the elevator. The lab is located at the first door on the left as you exit the elevator.  Due to recent changes in healthcare laws, you may see the results of your imaging and laboratory studies on MyChart before your provider has had a chance to review them.  We understand that in some cases there may be results that are confusing or concerning to you. Not all laboratory results come back in the same time frame and the provider may be waiting for multiple results in order to interpret others.  Please give [place] 48 hours in order for your provider to thoroughly review all the results before contacting the office for clarification of your results.   It was a pleasure to see you today!  Thank you for trusting me with  your gastrointestinal care!

## 2020-12-12 NOTE — Progress Notes (Signed)
[facility] Consult Note:  History: Marcus Hart 12/12/2020  Referring provider: [name], MD  Reason for consult/chief complaint: Abdominal Pain (On/off x years, pain goes thru to his back at times, complains of abdominal pain after eating spicy foods, no nausea/vomiting/diarrhea/fever )   Subjective  HPI:  Marcus Hart was here with his wife today, referred by primary care for abdominal pain. 10/27/17 screening colonoscopy with me:  complete exam, good prep, diminutive rectal inflammatory polyp.  For at least the last year he has had intermittent upper abdominal pain that is somewhat difficult to characterize, but sounds like a postprandial squeezing or sometimes burning sensation.  He also periodically gets heartburn without dysphagia feelings of regurgitation, nausea or vomiting.  Pain sometimes felt in the back.  He believes that the symptoms affect his appetite at times, and his wife thinks it may have led to some weight loss.  (Of note, his weight is stable compared to primary care visit in February of this year)  His bowel habits are regular without rectal bleeding.  ROS:  Review of Systems  Constitutional:  Negative for appetite change and unexpected weight change.  HENT:  Negative for mouth sores and voice change.   Eyes:  Negative for pain and redness.  Respiratory:  Negative for cough and shortness of breath.   Cardiovascular:  Negative for chest pain and palpitations.  Genitourinary:  Negative for dysuria and hematuria.  Musculoskeletal:  Negative for arthralgias and myalgias.  Skin:  Negative for pallor and rash.  Neurological:  Negative for weakness and headaches.  Hematological:  Negative for adenopathy.    Past Medical History: Past Medical History:  Diagnosis Date   Diabetes mellitus ([place])    Diabetes mellitus without complication ([place])    Phreesia 11/10/2019   Hyperlipidemia      Past Surgical History: Past Surgical History:   Procedure Laterality Date   NO PAST SURGERIES       Family History: Family History  Problem Relation Age of Onset   Diabetes Mother    Bladder Cancer Mother    Hypertension Mother    Ulcers Father    Benign prostatic hyperplasia Father    Colon cancer Neg Hx    Colon polyps Neg Hx     Social History: Social History   Socioeconomic History   Marital status: Married    Spouse name: [name]   Number of children: 4   Years of education: Not on file   Highest education level: Not on file  Occupational History   Occupation: Mining engineer  Tobacco Use   Smoking status: Never   Smokeless tobacco: Never  Vaping Use   Vaping Use: Never used  Substance and Sexual Activity   Alcohol use: Never    Alcohol/week: 0.0 standard drinks   Drug use: No   Sexual activity: Yes  Other Topics Concern   Not on file  Social History Narrative   Married   Education: Secretary/administrator   Exercise: No   Social Determinants of Radio broadcast assistant Strain: Not on file  Food Insecurity: Not on file  Transportation Needs: Not on file  Physical Activity: Not on file  Stress: Not on file  Social Connections: Not on file    Allergies: Allergies  Allergen Reactions   Other     Outpatient Meds: Current Outpatient Medications  Medication Sig Dispense Refill   aspirin 81 MG tablet Take 81 mg by mouth daily.     Dulaglutide (TRULICITY) 1.5 0000000 SOPN  Inject 1.5 mg into the skin once a week. 6 mL 2   famotidine (PEPCID) 20 MG tablet Take 1 tablet (20 mg total) by mouth 2 (two) times daily. 60 tablet 1   glucose blood test strip Use as instructed 100 each 4   metFORMIN (GLUCOPHAGE) 1000 MG tablet Take 1 tab by mouth 2 time daily with a meal. 180 tablet 2   rosuvastatin (CRESTOR) 10 MG tablet TAKE 1 TABLET(10 MG) BY MOUTH DAILY 90 tablet 2   No current facility-administered medications for this visit.   Started Trulicity about a year ago (based on PCP  notes)   ___________________________________________________________________ Objective   Exam:  BP 130/70   Pulse 62   Ht '5\' 9"'$  (1.753 m)   Wt 170 lb (77.1 kg)   BMI 25.10 kg/m  Wt Readings from Last 3 Encounters:  12/12/20 170 lb (77.1 kg)  09/19/20 166 lb 6.4 oz (75.5 kg)  06/21/20 171 lb (77.6 kg)    General: Well-appearing Eyes: sclera anicteric, no redness ENT: oral mucosa moist without lesions, no cervical or supraclavicular lymphadenopathy CV: RRR without murmur, S1/S2, no JVD, no peripheral edema Resp: clear to auscultation bilaterally, normal RR and effort noted GI: soft, no tenderness, with active bowel sounds. No guarding or palpable organomegaly noted. Skin; warm and dry, no rash or jaundice noted Neuro: awake, alert and oriented x 3. Normal gross motor function and fluent speech  Labs:  Last cbc in 2017  CMP Latest Ref Rng & Units 09/19/2020 03/15/2020 12/14/2019  Glucose 70 - 99 mg/dL 111(H) 100(H) 108(H)  BUN 6 - 23 mg/dL '14 14 11  '$ Creatinine 0.40 - 1.50 mg/dL 0.79 0.70(L) 0.82  Sodium 135 - 145 mEq/L 138 138 140  Potassium 3.5 - 5.1 mEq/L 5.0 4.2 4.5  Chloride 96 - 112 mEq/L 103 102 102  CO2 19 - 32 mEq/L '30 23 22  '$ Calcium 8.4 - 10.5 mg/dL 9.7 9.4 9.6  Total Protein 6.0 - 8.3 g/dL 7.2 7.2 6.6  Total Bilirubin 0.2 - 1.2 mg/dL 0.4 0.3 0.4  Alkaline Phos 39 - 117 U/L 45 50 47(L)  AST 0 - 37 U/L '17 16 14  '$ ALT 0 - 53 U/L '13 14 15   '$ Lab Results  Component Value Date   HGBA1C 7.1 (H) 09/19/2020   (Similar values over few yrs prior)  Radiologic Studies:  No abdominal imaging  Assessment: Encounter Diagnosis  Name Primary?   Epigastric pain Yes   Dyspepsia that is somewhat difficult to characterize, some upper abdominal discomfort and heartburn.  Usually occurs after meals. Timing roughly coincides with when he started Trulicity, which is known to cause modest delayed gastric emptying and could be related.  Native of [place], must consider H. pylori,  ulcer and less likely malignancy.  I am concerned that it sounds like it is affecting his appetite even though his weight is documented to be stable over at least the last 6 months. Pain is sometimes felt in the back, consider biliary colic  Plan: Labs today: CBC and H. pylori antibody.  If positive, antibiotic treatment.  EGD, particularly with reports of how this is affecting his appetite, and he and his wife both say it has been escalating in the last 6 months.  Agreeable after discussion of procedure and risks.  The benefits and risks of the planned procedure were described in detail with the patient or (when appropriate) their health care proxy.  Risks were outlined as including, but not limited to, bleeding,  infection, perforation, adverse medication reaction leading to cardiac or pulmonary decompensation, pancreatitis (if ERCP).  The limitation of incomplete mucosal visualization was also discussed.  No guarantees or warranties were given.  Procedure was booked several weeks out so that if H. pylori antibody is positive and treated, we can biopsy for eradication.  Continue current famotidine and no other medicine changes at present.  Thank you for the courtesy of this consult.  Please call me with any questions or concerns.  Nelida Meuse III  CC: Referring provider noted above

## 2020-12-13 ENCOUNTER — Other Ambulatory Visit (INDEPENDENT_AMBULATORY_CARE_PROVIDER_SITE_OTHER): Payer: 59

## 2020-12-13 DIAGNOSIS — R1013 Epigastric pain: Secondary | ICD-10-CM

## 2020-12-13 LAB — H. PYLORI ANTIBODY, IGG: H Pylori IgG: POSITIVE — AB

## 2020-12-16 ENCOUNTER — Other Ambulatory Visit: Payer: 59

## 2020-12-17 ENCOUNTER — Other Ambulatory Visit: Payer: Self-pay

## 2020-12-17 MED ORDER — DOXYCYCLINE MONOHYDRATE 100 MG PO TABS: 100.0000 mg | ORAL_TABLET | Freq: Two times a day (BID) | ORAL | 0 refills | Status: DC

## 2020-12-17 MED ORDER — OMEPRAZOLE 20 MG PO CPDR: 20.0000 mg | DELAYED_RELEASE_CAPSULE | Freq: Two times a day (BID) | ORAL | 0 refills | Status: DC

## 2020-12-17 MED ORDER — BISMUTH SUBSALICYLATE 262 MG PO CHEW: 524.0000 mg | CHEWABLE_TABLET | Freq: Four times a day (QID) | ORAL | 0 refills | Status: DC

## 2020-12-17 MED ORDER — METRONIDAZOLE 250 MG PO TABS: 250.0000 mg | ORAL_TABLET | Freq: Four times a day (QID) | ORAL | 0 refills | Status: DC

## 2020-12-17 NOTE — Progress Notes (Signed)
Patients wife has been notified and aware

## 2020-12-17 NOTE — Progress Notes (Signed)
Patients wife has been notified and aware. All info has been sent via mychart and new instructions have been mailed to home address on file

## 2020-12-19 ENCOUNTER — Ambulatory Visit: Payer: 59 | Admitting: Family Medicine

## 2020-12-19 DIAGNOSIS — E1165 Type 2 diabetes mellitus with hyperglycemia: Secondary | ICD-10-CM

## 2020-12-19 DIAGNOSIS — R1013 Epigastric pain: Secondary | ICD-10-CM

## 2020-12-19 DIAGNOSIS — E785 Hyperlipidemia, unspecified: Secondary | ICD-10-CM

## 2020-12-20 ENCOUNTER — Telehealth: Payer: Self-pay

## 2020-12-20 NOTE — Telephone Encounter (Signed)
APPROVAL  Medication: Omeprazole Insurance Company: [employer] [place] response: Approved Approval dates: 12/20/20 through 01/20/21 Misc. Notes: Luiz Shanley (Key: [id number])  This request has received a Favorable outcome.  Please note any additional information provided by [employer] at the bottom of this request.  Kayla Adger Key: [id number] - PA Case ID: [id number] - Rx #[id number] Need help? Call us at [phone] Outcome Approvedtoday PA Case: [id number], Status: Approved, Coverage Starts on: 12/20/2020 12:00:00 AM, Coverage Ends on: 01/20/2021 12:00:00 AM. Drug Omeprazole '20MG'$  dr capsules Building services engineer Prior Authorization Form Original Claim Info 9G

## 2020-12-27 ENCOUNTER — Encounter: Payer: 59 | Admitting: Gastroenterology

## 2021-01-15 ENCOUNTER — Ambulatory Visit: Payer: 59 | Admitting: Family Medicine

## 2021-01-15 DIAGNOSIS — R1013 Epigastric pain: Secondary | ICD-10-CM

## 2021-01-15 DIAGNOSIS — E1165 Type 2 diabetes mellitus with hyperglycemia: Secondary | ICD-10-CM

## 2021-01-15 DIAGNOSIS — E785 Hyperlipidemia, unspecified: Secondary | ICD-10-CM

## 2021-01-17 ENCOUNTER — Encounter: Payer: 59 | Admitting: Gastroenterology

## 2021-01-18 ENCOUNTER — Other Ambulatory Visit: Payer: Self-pay

## 2021-01-21 ENCOUNTER — Telehealth: Payer: Self-pay | Admitting: Gastroenterology

## 2021-01-21 NOTE — Telephone Encounter (Signed)
Left voicemail for patient that Omeprazole prior authorization was good for a year. The Omeprazole was also prescribed for a short period of time to treat his H.Pylori. He should be restarting his Famotidine since he should have completed the Omeprazole.

## 2021-01-21 NOTE — Telephone Encounter (Signed)
Patient called back stating that the pharmacy had not filled his [place]. I advised that most pharmacies do not fill Pepto Bismol and that he would have to pick it up over the counter. Patient expressed understanding in getting them off the shelf from the pharmacy. While on the phone he rescheduled his EGD.

## 2021-01-21 NOTE — Telephone Encounter (Signed)
Inbound call from patient stating omeprazole medication requires another prior authorization please.

## 2021-02-06 ENCOUNTER — Encounter: Payer: Self-pay | Admitting: Gastroenterology

## 2021-02-06 ENCOUNTER — Ambulatory Visit (AMBULATORY_SURGERY_CENTER): Payer: 59 | Admitting: Gastroenterology

## 2021-02-06 ENCOUNTER — Other Ambulatory Visit: Payer: Self-pay

## 2021-02-06 VITALS — BP 100/62 | HR 59 | Temp 97.5°F | Resp 20 | Ht 69.0 in | Wt 170.0 lb

## 2021-02-06 DIAGNOSIS — R1013 Epigastric pain: Secondary | ICD-10-CM

## 2021-02-06 DIAGNOSIS — A048 Other specified bacterial intestinal infections: Secondary | ICD-10-CM

## 2021-02-06 DIAGNOSIS — K259 Gastric ulcer, unspecified as acute or chronic, without hemorrhage or perforation: Secondary | ICD-10-CM

## 2021-02-06 DIAGNOSIS — K2970 Gastritis, unspecified, without bleeding: Secondary | ICD-10-CM | POA: Diagnosis not present

## 2021-02-06 MED ORDER — SODIUM CHLORIDE 0.9 % IV SOLN: 500.0000 mL | Freq: Once | INTRAVENOUS | Status: DC

## 2021-02-06 NOTE — Progress Notes (Signed)
Report given to PACU, vss 

## 2021-02-06 NOTE — Progress Notes (Signed)
0949 Robinul 0.1 mg IV given due large amount of secretions upon assessment.  MD made aware, vss  

## 2021-02-06 NOTE — Progress Notes (Signed)
History and Physical:  This patient presents for endoscopic testing for: Encounter Diagnosis  Name Primary?   Epigastric pain Yes    Seen office 12/12/20. Was H pylori Ab positive and treated Patient reports symptoms improved    Past Medical History: Past Medical History:  Diagnosis Date   Diabetes mellitus ([place])    Diabetes mellitus without complication ([place])    Phreesia 11/10/2019   Hyperlipidemia      Past Surgical History: Past Surgical History:  Procedure Laterality Date   COLONOSCOPY     NO PAST SURGERIES     UPPER GASTROINTESTINAL ENDOSCOPY      Allergies: No Known Allergies  Outpatient Meds: Current Outpatient Medications  Medication Sig Dispense Refill   bismuth subsalicylate (PEPTO BISMOL) 262 MG chewable tablet Chew 2 tablets (524 mg total) by mouth 4 (four) times daily. 40 tablet 0   Dulaglutide (TRULICITY) 1.5 YJ/8.5UD SOPN Inject 1.5 mg into the skin once a week. 6 mL 2   glucose blood test strip Use as instructed 100 each 4   metFORMIN (GLUCOPHAGE) 1000 MG tablet Take 1 tab by mouth 2 time daily with a meal. 180 tablet 2   rosuvastatin (CRESTOR) 10 MG tablet TAKE 1 TABLET(10 MG) BY MOUTH DAILY 90 tablet 2   aspirin 81 MG tablet Take 81 mg by mouth daily.     famotidine (PEPCID) 20 MG tablet Take 1 tablet (20 mg total) by mouth 2 (two) times daily. (Patient not taking: Reported on 02/06/2021) 60 tablet 1   Current Facility-Administered Medications  Medication Dose Route Frequency Provider Last Rate Last Admin   0.9 %  sodium chloride infusion  500 mL Intravenous Once Danis, Kirke Corin, MD          ___________________________________________________________________ Objective   Exam:  BP (!) 155/91   Pulse 65   Temp (!) 97.5 F (36.4 C) (Temporal)   Resp 19   Ht 5\' 9"  (1.753 m)   Wt 170 lb (77.1 kg)   SpO2 100%   BMI 25.10 kg/m   CV: RRR without murmur, S1/S2 Resp: clear to auscultation bilaterally, normal RR and effort noted GI: soft,  no tenderness, with active bowel sounds.   Assessment: Encounter Diagnosis  Name Primary?   Epigastric pain Yes     Plan:  EGD   The patient is appropriate for an endoscopic procedure in the ambulatory setting.   - Wilfrid Lund, MD

## 2021-02-06 NOTE — Op Note (Signed)
[place] Patient Name: Marcus Hart Procedure Date: 02/06/2021 9:40 AM MRN: [id number] Endoscopist: Mallie Mussel L. Loletha Carrow , MD Age: 55 Referring MD:  Date of Birth: 03/11/1966 Gender: Male Account #: [id number] Procedure:                Upper GI endoscopy Indications:              Epigastric abdominal pain (resolved after                            treatment), Previously treated for Helicobacter                            pylori Medicines:                Monitored Anesthesia Care Procedure:                Pre-Anesthesia Assessment:                           - Prior to the procedure, a History and Physical                            was performed, and patient medications and                            allergies were reviewed. The patient's tolerance of                            previous anesthesia was also reviewed. The risks                            and benefits of the procedure and the sedation                            options and risks were discussed with the patient.                            All questions were answered, and informed consent                            was obtained. Prior Anticoagulants: The patient has                            taken no previous anticoagulant or antiplatelet                            agents. ASA Grade Assessment: II - A patient with                            mild systemic disease. After reviewing the risks                            and benefits, the patient was deemed in  satisfactory condition to undergo the procedure.                           After obtaining informed consent, the endoscope was                            passed under direct vision. Throughout the                            procedure, the patient's blood pressure, pulse, and                            oxygen saturations were monitored continuously. The                            Endoscope was introduced through the mouth, and                             advanced to the second part of duodenum. The upper                            GI endoscopy was accomplished without difficulty.                            The patient tolerated the procedure well. Scope In: Scope Out: Findings:                 The larynx was normal.                           The esophagus was normal.                           A few erosions with no bleeding and no stigmata of                            recent bleeding were found in the gastric antrum.                            Biopsies were taken with a cold forceps for                            histology.                           Patchy mildly erythematous mucosa was found in the                            gastric body. Biopsies were taken with a cold                            forceps for histology.                           The exam of the stomach was otherwise normal.  The cardia and gastric fundus were normal on                            retroflexion.                           The examined duodenum was normal. Complications:            No immediate complications. Estimated Blood Loss:     Estimated blood loss was minimal. Impression:               - Normal larynx.                           - Normal esophagus.                           - Gastric erosions with no bleeding and no stigmata                            of recent bleeding. Biopsied.                           - Erythematous mucosa in the gastric body. Biopsied.                           - Normal examined duodenum.                           Biopsies taken to confirm H pylori eradication.                            However, patient's reported symptom resolution                            implies that is most likely the case. Recommendation:           - Patient has a contact number available for                            emergencies. The signs and symptoms of potential                            delayed  complications were discussed with the                            patient. Return to normal activities tomorrow.                            Written discharge instructions were provided to the                            patient.                           - Resume previous diet.                           -  Continue present medications.                           - Await pathology results. [name], MD 02/06/2021 10:11:09 AM This report has been signed electronically.

## 2021-02-06 NOTE — Progress Notes (Signed)
Called to room to assist during endoscopic procedure.  Patient ID and intended procedure confirmed with present staff. Received instructions for my participation in the procedure from the performing physician.  

## 2021-02-06 NOTE — Progress Notes (Signed)
VS  DT ? ?Pt's states no medical or surgical changes since previsit or office visit. ? ?

## 2021-02-06 NOTE — Patient Instructions (Signed)
Resume previous diet and continue present medications. Awaiting pathology results.  YOU HAD AN ENDOSCOPIC PROCEDURE TODAY AT [facility]:   Refer to the procedure report that was given to you for any specific questions about what was found during the examination.  If the procedure report does not answer your questions, please call your gastroenterologist to clarify.  If you requested that your care partner not be given the details of your procedure findings, then the procedure report has been included in a sealed envelope for you to review at your convenience later.  YOU SHOULD EXPECT: Some feelings of bloating in the abdomen. Passage of more gas than usual.  Walking can help get rid of the air that was put into your GI tract during the procedure and reduce the bloating. If you had a lower endoscopy (such as a colonoscopy or flexible sigmoidoscopy) you may notice spotting of blood in your stool or on the toilet paper. If you underwent a bowel prep for your procedure, you may not have a normal bowel movement for a few days.  Please Note:  You might notice some irritation and congestion in your nose or some drainage.  This is from the oxygen used during your procedure.  There is no need for concern and it should clear up in a day or so.  SYMPTOMS TO REPORT IMMEDIATELY:  Following upper endoscopy (EGD)  Vomiting of blood or coffee ground material  New chest pain or pain under the shoulder blades  Painful or persistently difficult swallowing  New shortness of breath  Fever of 100F or higher  Black, tarry-looking stools  For urgent or emergent issues, a gastroenterologist can be reached at any hour by calling [phone]. Do not use MyChart messaging for urgent concerns.    DIET:  We do recommend a small meal at first, but then you may proceed to your regular diet.  Drink plenty of fluids but you should avoid alcoholic beverages for 24 hours.  ACTIVITY:  You should plan to  take it easy for the rest of today and you should NOT DRIVE or use heavy machinery until tomorrow (because of the sedation medicines used during the test).    FOLLOW UP: Our staff will call the number listed on your records 48-72 hours following your procedure to check on you and address any questions or concerns that you may have regarding the information given to you following your procedure. If we do not reach you, we will leave a message.  We will attempt to reach you two times.  During this call, we will ask if you have developed any symptoms of COVID 19. If you develop any symptoms (ie: fever, flu-like symptoms, shortness of breath, cough etc.) before then, please call [phone].  If you test positive for Covid 19 in the 2 weeks post procedure, please call and report this information to [place].    If any biopsies were taken you will be contacted by phone or by letter within the next 1-3 weeks.  Please call us at [phone] if you have not heard about the biopsies in 3 weeks.    SIGNATURES/CONFIDENTIALITY: You and/or your care partner have signed paperwork which will be entered into your electronic medical record.  These signatures attest to the fact that that the information above on your After Visit Summary has been reviewed and is understood.  Full responsibility of the confidentiality of this discharge information lies with you and/or your care-partner.

## 2021-02-10 ENCOUNTER — Telehealth: Payer: Self-pay

## 2021-02-10 NOTE — Telephone Encounter (Signed)
  Follow up Call-  Call back number 02/06/2021  Post procedure Call Back phone  # [phone]  Permission to leave phone message Yes  Some recent data might be hidden     Patient questions:  Do you have a fever, pain , or abdominal swelling? No. Pain Score  0 *  Have you tolerated food without any problems? Yes.    Have you been able to return to your normal activities? Yes.    Do you have any questions about your discharge instructions: Diet   No. Medications  No. Follow up visit  No.  Do you have questions or concerns about your Care? No.  Actions: * If pain score is 4 or above: No action needed, pain <4.

## 2021-02-11 ENCOUNTER — Encounter: Payer: Self-pay | Admitting: Gastroenterology

## 2021-02-14 ENCOUNTER — Other Ambulatory Visit: Payer: Self-pay

## 2021-02-14 ENCOUNTER — Ambulatory Visit (INDEPENDENT_AMBULATORY_CARE_PROVIDER_SITE_OTHER): Payer: 59 | Admitting: Family Medicine

## 2021-02-14 ENCOUNTER — Encounter: Payer: Self-pay | Admitting: Family Medicine

## 2021-02-14 VITALS — BP 120/82 | HR 62 | Temp 97.6°F | Ht 69.0 in | Wt 173.0 lb

## 2021-02-14 DIAGNOSIS — R1013 Epigastric pain: Secondary | ICD-10-CM

## 2021-02-14 DIAGNOSIS — E785 Hyperlipidemia, unspecified: Secondary | ICD-10-CM

## 2021-02-14 DIAGNOSIS — Z23 Encounter for immunization: Secondary | ICD-10-CM | POA: Diagnosis not present

## 2021-02-14 DIAGNOSIS — E1165 Type 2 diabetes mellitus with hyperglycemia: Secondary | ICD-10-CM

## 2021-02-14 LAB — POCT GLYCOSYLATED HEMOGLOBIN (HGB A1C): Hemoglobin A1C: 7.8 % — AB (ref 4.0–5.6)

## 2021-02-14 MED ORDER — TRULICITY 1.5 MG/0.5ML ~~LOC~~ SOAJ: 1.5000 mg | SUBCUTANEOUS | 2 refills | Status: DC

## 2021-02-14 MED ORDER — ROSUVASTATIN CALCIUM 10 MG PO TABS: ORAL_TABLET | ORAL | 2 refills | Status: DC

## 2021-02-14 MED ORDER — TRULICITY 3 MG/0.5ML ~~LOC~~ SOAJ: 3.0000 mg | SUBCUTANEOUS | 1 refills | Status: DC

## 2021-02-14 MED ORDER — METFORMIN HCL 1000 MG PO TABS: ORAL_TABLET | ORAL | 2 refills | Status: DC

## 2021-02-14 NOTE — Patient Instructions (Signed)
Increase Trulicity to 3mg  dose. Recheck in 3 months. No other changes for now. If any low blood sugars be seen right away.

## 2021-02-14 NOTE — Addendum Note (Signed)
Addended by: Elta Guadeloupe on: 02/14/2021 09:58 AM   Modules accepted: Orders

## 2021-02-14 NOTE — Progress Notes (Signed)
Subjective:  Patient ID: Marcus Hart, male    DOB: 1965/12/22  Age: 55 y.o. MRN: [id number]  CC:  Chief Complaint  Patient presents with   Diabetes    3 month follow-up    HPI Marcus Hart presents for   Diabetes: With hyperglycemia.  Trulicity 1.5mg  Q week, metformin 1000mg  BID,  no new side effects.   On statin. Home readings: Fasting: 120-130 2hr PP:180-200.  Microalbumin: normal in May  Optho, foot exam, pneumovax: up to date.  Covid booster recommended.   Lab Results  Component Value Date   HGBA1C 7.1 (H) 09/19/2020   HGBA1C 7.7 (H) 06/21/2020   HGBA1C 7.2 (H) 03/15/2020   Lab Results  Component Value Date   MICROALBUR <0.7 09/19/2020   LDLCALC 169 (H) 09/19/2020   CREATININE 0.79 09/19/2020   Heartburn Seen by GI, treated for H pylori. Improved. No current heartburn.  Endoscopy 9/22 - Dr. Loletha Carrow: - Normal larynx. - Normal esophagus. - Gastric erosions with no bleeding and no stigmata of recent bleeding. Biopsied. - Erythematous mucosa in the gastric body. Biopsied. - Normal examined duodenum. Biopsies taken to confirm H pylori eradication.negative for H Pylori, chronic gastritis.    History Patient Active Problem List   Diagnosis Date Noted   DM2 (diabetes mellitus, type 2) ([place]) 02/13/2013   Other and unspecified hyperlipidemia 02/13/2013   Past Medical History:  Diagnosis Date   Diabetes mellitus ([place])    Diabetes mellitus without complication ([place])    Phreesia 11/10/2019   Hyperlipidemia    Past Surgical History:  Procedure Laterality Date   COLONOSCOPY     NO PAST SURGERIES     UPPER GASTROINTESTINAL ENDOSCOPY     No Known Allergies Prior to Admission medications   Medication Sig Start Date End Date Taking? Authorizing Provider  aspirin 81 MG tablet Take 81 mg by mouth daily.   Yes [provider]  bismuth subsalicylate (PEPTO BISMOL) 262 MG chewable tablet Chew 2 tablets (524 mg total) by mouth 4 (four) times daily.  12/17/20  Yes Danis, Kirke Corin, MD  Dulaglutide (TRULICITY) 1.5 YB/6.3SL SOPN Inject 1.5 mg into the skin once a week. 06/30/20  Yes [name], MD  glucose blood test strip Use as instructed 03/15/20  Yes [name], MD  metFORMIN (GLUCOPHAGE) 1000 MG tablet Take 1 tab by mouth 2 time daily with a meal. 06/21/20  Yes [name], MD  rosuvastatin (CRESTOR) 10 MG tablet TAKE 1 TABLET(10 MG) BY MOUTH DAILY 06/21/20  Yes [name], MD  famotidine (PEPCID) 20 MG tablet Take 1 tablet (20 mg total) by mouth 2 (two) times daily. Patient not taking: Reported on 02/14/2021 09/19/20   [name], MD   Social History   Socioeconomic History   Marital status: Married    Spouse name: [name]   Number of children: 4   Years of education: Not on file   Highest education level: Not on file  Occupational History   Occupation: Mining engineer  Tobacco Use   Smoking status: Never   Smokeless tobacco: Never  Vaping Use   Vaping Use: Never used  Substance and Sexual Activity   Alcohol use: Never    Alcohol/week: 0.0 standard drinks   Drug use: No   Sexual activity: Yes  Other Topics Concern   Not on file  Social History Narrative   Married   Education: Secretary/administrator   Exercise: No   Social Determinants of Radio broadcast assistant  Strain: Not on file  Food Insecurity: Not on file  Transportation Needs: Not on file  Physical Activity: Not on file  Stress: Not on file  Social Connections: Not on file  Intimate Partner Violence: Not on file    Review of Systems  Constitutional:  Negative for fatigue and unexpected weight change.  Eyes:  Negative for visual disturbance.  Respiratory:  Negative for cough, chest tightness and shortness of breath.   Cardiovascular:  Negative for chest pain, palpitations and leg swelling.  Gastrointestinal:  Negative for abdominal pain and blood in stool.  Neurological:  Negative for dizziness, light-headedness and headaches.    Objective:    Vitals:   02/14/21 0847  BP: 120/82  Pulse: 62  Temp: 97.6 F (36.4 C)  SpO2: 97%  Weight: 173 lb (78.5 kg)  Height: 5\' 9"  (1.753 m)     Physical Exam Vitals reviewed.  Constitutional:      Appearance: He is well-developed.  HENT:     Head: Normocephalic and atraumatic.  Neck:     Vascular: No carotid bruit or JVD.  Cardiovascular:     Rate and Rhythm: Normal rate and regular rhythm.     Heart sounds: Normal heart sounds. No murmur heard. Pulmonary:     Effort: Pulmonary effort is normal.     Breath sounds: Normal breath sounds. No rales.  Abdominal:     General: Abdomen is flat. There is no distension.  Musculoskeletal:     Right lower leg: No edema.     Left lower leg: No edema.  Skin:    General: Skin is warm and dry.  Neurological:     Mental Status: He is alert and oriented to person, place, and time.  Psychiatric:        Mood and Affect: Mood normal.     Lab Results  Component Value Date   HGBA1C 7.8 (A) 02/14/2021     Assessment & Plan:  Marcus Hart is a 55 y.o. male . Dyspepsia  -Resolved, status posttreatment as above, overall reassuring biopsies, endoscopy as above, follow-up with gastroenterology if needed.  Type 2 diabetes mellitus with hyperglycemia, without long-term current use of insulin (HCC) - Plan: metFORMIN (GLUCOPHAGE) 1000 MG tablet, Dulaglutide (TRULICITY) 1.5 QQ/5.9DG SOPN, POCT glycosylated hemoglobin (Hb A1C)  - increase trulicity to 3mg  per week, same dose metformin for now. Recheck in 3 months   Hyperlipidemia, unspecified hyperlipidemia type - Plan: rosuvastatin (CRESTOR) 10 MG tablet  -  Stable, tolerating current regimen. Medications refilled. Labs pending at next visit.   Meds ordered this encounter  Medications   metFORMIN (GLUCOPHAGE) 1000 MG tablet    Sig: Take 1 tab by mouth 2 time daily with a meal.    Dispense:  180 tablet    Refill:  2   Dulaglutide (TRULICITY) 1.5 LO/7.5IE SOPN    Sig: Inject 1.5 mg into  the skin once a week.    Dispense:  6 mL    Refill:  2    New dose.   rosuvastatin (CRESTOR) 10 MG tablet    Sig: TAKE 1 TABLET(10 MG) BY MOUTH DAILY    Dispense:  90 tablet    Refill:  2   There are no Patient Instructions on file for this visit.    Signed,   [name], MD [place] 02/14/21 9:11 AM

## 2021-03-28 ENCOUNTER — Other Ambulatory Visit: Payer: Self-pay

## 2021-03-28 ENCOUNTER — Telehealth: Payer: Self-pay | Admitting: Family Medicine

## 2021-03-28 DIAGNOSIS — E785 Hyperlipidemia, unspecified: Secondary | ICD-10-CM

## 2021-03-28 DIAGNOSIS — E1165 Type 2 diabetes mellitus with hyperglycemia: Secondary | ICD-10-CM

## 2021-03-28 MED ORDER — TRULICITY 3 MG/0.5ML ~~LOC~~ SOAJ
3.0000 mg | SUBCUTANEOUS | 1 refills | Status: DC
Start: 2021-03-28 — End: 2021-06-03

## 2021-03-28 MED ORDER — METFORMIN HCL 1000 MG PO TABS: ORAL_TABLET | ORAL | 2 refills | Status: DC

## 2021-03-28 MED ORDER — ROSUVASTATIN CALCIUM 10 MG PO TABS: ORAL_TABLET | ORAL | 2 refills | Status: DC

## 2021-03-28 NOTE — Telephone Encounter (Signed)
..  Caller name:  Vrishank Niday  Caller callback #[phone]  Encourage patient to contact the pharmacy for refills or they can request refills through [facility]  (Please schedule appointment if patient has not been seen in over a year)  MEDICATION NAME & DOSE:  Trulicity, metformin, rosuvastatin  Notes/Comments from patient:  - patient states that he is completely out of these medications and he would like 90 days worth sent in.  WHAT PHARMACY WOULD THEY LIKE THIS SENT TO:   [facility] [address] of [address] in [place]  Please notify patient: It takes 48-72 hours to process rx refill requests Ask patient to call pharmacy to ensure rx is ready before heading there.   (CLINICAL TO FILL OR ROUTE PER PROTOCOLS)

## 2021-03-28 NOTE — Telephone Encounter (Signed)
All # Rs have been filled and sent to patient pharmacy

## 2021-05-28 LAB — HM DIABETES EYE EXAM

## 2021-05-29 ENCOUNTER — Encounter: Payer: Self-pay | Admitting: Family Medicine

## 2021-05-30 ENCOUNTER — Ambulatory Visit: Payer: 59 | Admitting: Family Medicine

## 2021-05-30 ENCOUNTER — Telehealth: Payer: Self-pay

## 2021-05-30 DIAGNOSIS — E1165 Type 2 diabetes mellitus with hyperglycemia: Secondary | ICD-10-CM

## 2021-05-30 NOTE — Telephone Encounter (Signed)
Please advise if dose should be changed or other recommendation?

## 2021-05-30 NOTE — Telephone Encounter (Signed)
Caller name:Gina Mota   On DPR? :Yes  Call back number:[phone]  Provider they see: Carlota Raspberry  Reason for call:Pt is calling he is having trouble finding Dulaglutide (TRULICITY) 3 LA/4.5XM SOPN  1.5 or 2mg  but not 3 are available at the pharmacy Pt is wanting to know if the MG can be changed?

## 2021-06-03 MED ORDER — TRULICITY 1.5 MG/0.5ML ~~LOC~~ SOAJ: 3.0000 mg | SUBCUTANEOUS | 0 refills | Status: DC

## 2021-06-03 NOTE — Telephone Encounter (Signed)
Temporary change to (2) of the 1.5mg  injection nutil 3mg  back in stock. I sent in enough for next few months, but keep appt with me as scheduled next month.

## 2021-06-03 NOTE — Telephone Encounter (Signed)
Pharmacy received

## 2021-06-09 ENCOUNTER — Other Ambulatory Visit: Payer: Self-pay | Admitting: Family Medicine

## 2021-06-09 DIAGNOSIS — E1165 Type 2 diabetes mellitus with hyperglycemia: Secondary | ICD-10-CM

## 2021-06-11 ENCOUNTER — Telehealth: Payer: Self-pay | Admitting: Family Medicine

## 2021-06-11 NOTE — Telephone Encounter (Signed)
Pt called in stating that the Trulicity needs a PA. Please advise, pt has been out of this medication.

## 2021-06-11 NOTE — Telephone Encounter (Signed)
Pt has been informed this was submitted to insurance yesterday

## 2021-06-17 ENCOUNTER — Telehealth: Payer: Self-pay

## 2021-06-17 DIAGNOSIS — E1165 Type 2 diabetes mellitus with hyperglycemia: Secondary | ICD-10-CM

## 2021-06-17 NOTE — Telephone Encounter (Signed)
Insurance did not accept (2) of the 1.5mg  as 3mg  out of stock?  Did they provide other alternatives? Is 3mg  back in stock?  Let me know, as I can change to a different med but I would like to see if those options still exist.  Thanks.

## 2021-06-17 NOTE — Telephone Encounter (Signed)
Pt trulicity has been denied (pa completed last week and denied) is there an alternative pt can try

## 2021-06-18 NOTE — Telephone Encounter (Signed)
Unable to get through to the pharmacy will call again

## 2021-06-20 MED ORDER — VICTOZA 18 MG/3ML ~~LOC~~ SOPN: PEN_INJECTOR | SUBCUTANEOUS | 0 refills | Status: DC

## 2021-06-20 NOTE — Telephone Encounter (Signed)
Finally had time to wait on hold to speak to the pharmacist they report the only comparable medication is victoza and this may be covered but that they still do not have the 3 mg in stock.

## 2021-06-20 NOTE — Addendum Note (Signed)
Addended by: [name] R on: 06/20/2021 02:38 PM   Modules accepted: Orders

## 2021-06-20 NOTE — Telephone Encounter (Signed)
We will try Victoza.  Initially start at low dose then will titrate to higher dose after 1 week.  This medication is given  daily so once the Trulicity is back in stock can switch back as that is easier to dose.  Let me know if he has questions.

## 2021-06-20 NOTE — Telephone Encounter (Signed)
Called pt and informed gave him these directions and asked that he check at the pharmacy and let us know when the 3 mg/0.63ml is back in stock. He states he will I have given him daily directions but have also advised him to discuss with the pharmacist if he has questions or feels unclear

## 2021-06-25 ENCOUNTER — Other Ambulatory Visit: Payer: Self-pay | Admitting: Family Medicine

## 2021-06-25 DIAGNOSIS — E1165 Type 2 diabetes mellitus with hyperglycemia: Secondary | ICD-10-CM

## 2021-06-27 ENCOUNTER — Telehealth: Payer: Self-pay

## 2021-06-27 NOTE — Telephone Encounter (Signed)
Encourage patient to contact the pharmacy for refills or they can request refills through [facility]  (Please schedule appointment if patient has not been seen in over a year)    WHAT PHARMACY WOULD THEY LIKE THIS SENT TO: [id number] - [place], [address] AT [address] & DOSE:Pt needs needs for pin to the victoza   NOTES/COMMENTS FROM PATIENT:      [place] office please notify patient: It takes 48-72 hours to process rx refill requests Ask patient to call pharmacy to ensure rx is ready before heading there.

## 2021-06-30 ENCOUNTER — Other Ambulatory Visit: Payer: Self-pay

## 2021-06-30 MED ORDER — PEN NEEDLES 32G X 4 MM MISC: 0 refills | Status: DC

## 2021-06-30 MED ORDER — PEN NEEDLES 32G X 4 MM MISC: 3 refills | Status: AC

## 2021-06-30 NOTE — Addendum Note (Signed)
Addended by: [name] R on: 06/30/2021 06:41 PM   Modules accepted: Orders

## 2021-06-30 NOTE — Telephone Encounter (Signed)
Pt needs rx for needles for victoza please send

## 2021-07-03 ENCOUNTER — Ambulatory Visit (INDEPENDENT_AMBULATORY_CARE_PROVIDER_SITE_OTHER): Payer: 59 | Admitting: Family Medicine

## 2021-07-03 VITALS — BP 122/74 | HR 63 | Temp 98.0°F | Resp 16 | Ht 69.0 in | Wt 173.4 lb

## 2021-07-03 DIAGNOSIS — E1165 Type 2 diabetes mellitus with hyperglycemia: Secondary | ICD-10-CM | POA: Diagnosis not present

## 2021-07-03 DIAGNOSIS — E785 Hyperlipidemia, unspecified: Secondary | ICD-10-CM | POA: Diagnosis not present

## 2021-07-03 LAB — COMPREHENSIVE METABOLIC PANEL
ALT: 14 U/L (ref 0–53)
AST: 15 U/L (ref 0–37)
Albumin: 4.6 g/dL (ref 3.5–5.2)
Alkaline Phosphatase: 40 U/L (ref 39–117)
BUN: 19 mg/dL (ref 6–23)
CO2: 30 mEq/L (ref 19–32)
Calcium: 9.9 mg/dL (ref 8.4–10.5)
Chloride: 102 mEq/L (ref 96–112)
Creatinine, Ser: 0.76 mg/dL (ref 0.40–1.50)
GFR: 101.76 mL/min (ref >60.00)
Glucose, Bld: 129 mg/dL — ABNORMAL HIGH (ref 70–99)
Potassium: 4.4 mEq/L (ref 3.5–5.1)
Sodium: 136 mEq/L (ref 135–145)
Total Bilirubin: 0.5 mg/dL (ref 0.2–1.2)
Total Protein: 6.8 g/dL (ref 6.0–8.3)

## 2021-07-03 LAB — LIPID PANEL
Cholesterol: 179 mg/dL (ref 0–200)
HDL: 71.4 mg/dL (ref >39.00)
LDL Cholesterol: 84 mg/dL (ref 0–99)
NonHDL: 107.33
Total CHOL/HDL Ratio: 3
Triglycerides: 118 mg/dL (ref 0.0–149.0)
VLDL: 23.6 mg/dL (ref 0.0–40.0)

## 2021-07-03 LAB — HEMOGLOBIN A1C: Hgb A1c MFr Bld: 8.3 % — ABNORMAL HIGH (ref 4.6–6.5)

## 2021-07-03 MED ORDER — ONETOUCH VERIO VI STRP: ORAL_STRIP | 0 refills | Status: DC

## 2021-07-03 NOTE — Patient Instructions (Addendum)
Ok to increase victoza as planned but if any low readings return to 0.6mg  dose. No other changes at this time.  Labs will be elevated today, but can repeat some labs in 6 weeks at physical.

## 2021-07-03 NOTE — Progress Notes (Signed)
Subjective:  Patient ID: Marcus Hart, male    DOB: 11-02-1965  Age: 56 y.o. MRN: [id number]  CC:  Chief Complaint  Patient presents with   Diabetes    Pt reports since last September reports has not had Trulicity, has been taking the victoza this week sugar has been better since starting    Hyperlipidemia    Due for recheck     HPI Marcus Hart presents for   Diabetes: With hyperglycemia.  Previously on Trulicity, metformin.  Did recommend increasing Trulicity to 3 mg/week at his last visit in September due to elevated A1c.  Some difficulty with availability of Trulicity and recently changed to Victoza.  Started on 0.6 mg daily on February 8, with plan on increase to 1.2 mg daily after 1 week. Took trulicity 3mg  for 1 month, then out of trulicty fo 3 months d/t difficulty finding meds. Still taking metformin.  Home readings over 200- 300's after meals. Single 400. No n/v/abd pain Started victoza last week - now readings fasting during day around 100, morning 130-140, postprandial 194, 170. No sx lows. Will increase to 1.2mg  qd next week.  Likes current rx.  Crestor 10mg  qd - no new myalgias.  Microalbumin: Normal ratio 09/19/2020 Has pepcid- not needed, no recent heartburn.  Optho, foot exam, pneumovax: Foot exam today. Diabetic Foot Exam - Simple   Simple Foot Form Visual Inspection No deformities, no ulcerations, no other skin breakdown bilaterally: Yes Sensation Testing Intact to touch and monofilament testing bilaterally: Yes Pulse Check Posterior Tibialis and Dorsalis pulse intact bilaterally: Yes Comments     Lab Results  Component Value Date   HGBA1C 7.8 (A) 02/14/2021   HGBA1C 7.1 (H) 09/19/2020   HGBA1C 7.7 (H) 06/21/2020   Lab Results  Component Value Date   MICROALBUR <0.7 09/19/2020   LDLCALC 169 (H) 09/19/2020   CREATININE 0.79 09/19/2020    History Patient Active Problem List   Diagnosis Date Noted   DM2 (diabetes mellitus, type 2) ([place])  02/13/2013   Other and unspecified hyperlipidemia 02/13/2013   Past Medical History:  Diagnosis Date   Diabetes mellitus ([place])    Diabetes mellitus without complication ([place])    Phreesia 11/10/2019   Hyperlipidemia    Past Surgical History:  Procedure Laterality Date   COLONOSCOPY     NO PAST SURGERIES     UPPER GASTROINTESTINAL ENDOSCOPY     No Known Allergies Prior to Admission medications   Medication Sig Start Date End Date Taking? Authorizing Provider  aspirin 81 MG tablet Take 81 mg by mouth daily.   Yes [provider]  Insulin Pen Needle (PEN NEEDLES) 32G X 4 MM MISC Use with Victoza to inject once daily 06/30/21  Yes [name], MD  liraglutide (VICTOZA) 18 MG/3ML SOPN INJECT 0.6 MG UNDER THE SKIN ONCE DAILY FOR 7 DAYS, THEN INCREASE TO 1.2 MG ONCE DAILY 06/25/21  Yes [name], MD  metFORMIN (GLUCOPHAGE) 1000 MG tablet Take 1 tab by mouth 2 time daily with a meal. 03/28/21  Yes [name], MD  [facility] VERIO test strip USE AS INSTRUCTED 06/10/21  Yes [name], MD  rosuvastatin (CRESTOR) 10 MG tablet TAKE 1 TABLET(10 MG) BY MOUTH DAILY 03/28/21  Yes [name], MD  bismuth subsalicylate (PEPTO BISMOL) 262 MG chewable tablet Chew 2 tablets (524 mg total) by mouth 4 (four) times daily. Patient not taking: Reported on 07/03/2021 12/17/20   Doran Stabler, MD  famotidine (  PEPCID) 20 MG tablet Take 1 tablet (20 mg total) by mouth 2 (two) times daily. Patient not taking: Reported on 02/14/2021 09/19/20   [name], MD   Social History   Socioeconomic History   Marital status: Married    Spouse name: Marcus Hart   Number of children: 4   Years of education: Not on file   Highest education level: Not on file  Occupational History   Occupation: Mining engineer  Tobacco Use   Smoking status: Never   Smokeless tobacco: Never  Vaping Use   Vaping Use: Never used  Substance and Sexual Activity   Alcohol use: Never    Alcohol/week: 0.0  standard drinks   Drug use: No   Sexual activity: Yes  Other Topics Concern   Not on file  Social History Narrative   Married   Education: Secretary/administrator   Exercise: No   Social Determinants of Radio broadcast assistant Strain: Not on file  Food Insecurity: Not on file  Transportation Needs: Not on file  Physical Activity: Not on file  Stress: Not on file  Social Connections: Not on file  Intimate Partner Violence: Not on file    Review of Systems  Constitutional:  Negative for fatigue and unexpected weight change.  Eyes:  Negative for visual disturbance.  Respiratory:  Negative for cough, chest tightness and shortness of breath.   Cardiovascular:  Negative for chest pain, palpitations and leg swelling.  Gastrointestinal:  Negative for abdominal pain, blood in stool, nausea and vomiting.  Neurological:  Negative for dizziness, light-headedness and headaches.    Objective:   Vitals:   07/03/21 1041  BP: 122/74  Pulse: 63  Resp: 16  Temp: 98 F (36.7 C)  TempSrc: Temporal  SpO2: 96%  Weight: 173 lb 6.4 oz (78.7 kg)  Height: 5\' 9"  (1.753 m)     Physical Exam Vitals reviewed.  Constitutional:      Appearance: He is well-developed.  HENT:     Head: Normocephalic and atraumatic.  Neck:     Vascular: No carotid bruit or JVD.  Cardiovascular:     Rate and Rhythm: Normal rate and regular rhythm.     Heart sounds: Normal heart sounds. No murmur heard. Pulmonary:     Effort: Pulmonary effort is normal.     Breath sounds: Normal breath sounds. No rales.  Musculoskeletal:     Right lower leg: No edema.     Left lower leg: No edema.  Skin:    General: Skin is warm and dry.  Neurological:     Mental Status: He is alert and oriented to person, place, and time.  Psychiatric:        Mood and Affect: Mood normal.       Assessment & Plan:  Marcus Hart is a 56 y.o. male . Type 2 diabetes mellitus with hyperglycemia, without long-term current use of insulin (HCC) -  Plan: Comprehensive metabolic panel, Hemoglobin A1c, glucose blood (ONETOUCH VERIO) test strip  -Uncontrolled by home readings off Trulicity.  A1c will be elevated based on his previous few months off that med however improved readings on Victoza.  Plan on 6-week follow-up with possible fructosamine, physical continue metformin same dose.  Plan on increased dose of Victoza next week with hypoglycemia precautions discussed.  Hyperlipidemia, unspecified hyperlipidemia type - Plan: Lipid panel, Comprehensive metabolic panel  -Tolerating statin, check labs.  Continue same   Meds ordered this encounter  Medications   glucose blood (ONETOUCH VERIO) test strip  Sig: With lancets - #100, 3 rf. USE AS INSTRUCTED -    Dispense:  100 strip    Refill:  0    E11.65   Patient Instructions  Ok to increase victoza as planned but if any low readings return to 0.6mg  dose. No other changes at this time.  Labs will be elevated today, but can repeat some labs in 6 weeks at physical.     Signed,   [name], MD [place] 07/03/21 11:15 AM

## 2021-09-01 ENCOUNTER — Encounter: Payer: Self-pay | Admitting: Family Medicine

## 2021-09-01 ENCOUNTER — Ambulatory Visit (INDEPENDENT_AMBULATORY_CARE_PROVIDER_SITE_OTHER): Payer: 59 | Admitting: Family Medicine

## 2021-09-01 VITALS — BP 130/82 | HR 56 | Temp 98.2°F | Resp 17 | Ht 69.0 in | Wt 172.8 lb

## 2021-09-01 DIAGNOSIS — E785 Hyperlipidemia, unspecified: Secondary | ICD-10-CM | POA: Diagnosis not present

## 2021-09-01 DIAGNOSIS — Z0000 Encounter for general adult medical examination without abnormal findings: Secondary | ICD-10-CM | POA: Diagnosis not present

## 2021-09-01 DIAGNOSIS — H6121 Impacted cerumen, right ear: Secondary | ICD-10-CM | POA: Diagnosis not present

## 2021-09-01 DIAGNOSIS — Z23 Encounter for immunization: Secondary | ICD-10-CM

## 2021-09-01 DIAGNOSIS — E1165 Type 2 diabetes mellitus with hyperglycemia: Secondary | ICD-10-CM | POA: Diagnosis not present

## 2021-09-01 LAB — MICROALBUMIN / CREATININE URINE RATIO
Creatinine,U: 26.2 mg/dL
Microalb Creat Ratio: 2.7 mg/g (ref 0.0–30.0)
Microalb, Ur: <0.7 mg/dL (ref 0.0–1.9)

## 2021-09-01 MED ORDER — VICTOZA 18 MG/3ML ~~LOC~~ SOPN: 1.2000 mg | PEN_INJECTOR | Freq: Every day | SUBCUTANEOUS | 5 refills | Status: DC

## 2021-09-01 MED ORDER — ROSUVASTATIN CALCIUM 10 MG PO TABS: ORAL_TABLET | ORAL | 2 refills | Status: DC

## 2021-09-01 MED ORDER — METFORMIN HCL 1000 MG PO TABS: ORAL_TABLET | ORAL | 2 refills | Status: DC

## 2021-09-01 NOTE — Patient Instructions (Addendum)
Lab only visit in 1 month for the A1c. We can adjust meds at that time if needed.   ?Increase exercise. Goal of 150 minutes per week, spread over most days. Start low and go slow with intensity. ?If blocked ear, we can remove wax here- please call for an appointment if that is needed.  ?Thanks for coming in today and take care.  ? ?Earwax Buildup, Adult ?The ears produce a substance called earwax that helps keep bacteria out of the ear and protects the skin in the ear canal. Occasionally, earwax can build up in the ear and cause discomfort or hearing loss. ?What are the causes? ?This condition is caused by a buildup of earwax. Ear canals are self-cleaning. Ear wax is made in the outer part of the ear canal and generally falls out in small amounts over time. ?When the self-cleaning mechanism is not working, earwax builds up and can cause decreased hearing and discomfort. Attempting to clean ears with cotton swabs can push the earwax deep into the ear canal and cause decreased hearing and pain. ?What increases the risk? ?This condition is more likely to develop in people who: ?Clean their ears often with cotton swabs. ?Pick at their ears. ?Use earplugs or in-ear headphones often, or wear hearing aids. ?The following factors may also make you more likely to develop this condition: ?Being male. ?Being of older age. ?Naturally producing more earwax. ?Having narrow ear canals. ?Having earwax that is overly thick or sticky. ?Having excess hair in the ear canal. ?Having eczema. ?Being dehydrated. ?What are the signs or symptoms? ?Symptoms of this condition include: ?Reduced or muffled hearing. ?A feeling of fullness in the ear or feeling that the ear is plugged. ?Fluid coming from the ear. ?Ear pain or an itchy ear. ?Ringing in the ear. ?Coughing. ?Balance problems. ?An obvious piece of earwax that can be seen inside the ear canal. ?How is this diagnosed? ?This condition may be diagnosed based on: ?Your symptoms. ?Your  medical history. ?An ear exam. During the exam, your health care provider will look into your ear with an instrument called an otoscope. ?You may have tests, including a hearing test. ?How is this treated? ?This condition may be treated by: ?Using ear drops to soften the earwax. ?Having the earwax removed by a health care provider. The health care provider may: ?Flush the ear with water. ?Use an instrument that has a loop on the end (curette). ?Use a suction device. ?Having surgery to remove the wax buildup. This may be done in severe cases. ?Follow these instructions at home: ? ?Take over-the-counter and prescription medicines only as told by your health care provider. ?Do not put any objects, including cotton swabs, into your ear. You can clean the opening of your ear canal with a washcloth or facial tissue. ?Follow instructions from your health care provider about cleaning your ears. Do not overclean your ears. ?Drink enough fluid to keep your urine pale yellow. This will help to thin the earwax. ?Keep all follow-up visits as told. If earwax builds up in your ears often or if you use hearing aids, consider seeing your health care provider for routine, preventive ear cleanings. Ask your health care provider how often you should schedule your cleanings. ?If you have hearing aids, clean them according to instructions from the manufacturer and your health care provider. ?Contact a health care provider if: ?You have ear pain. ?You develop a fever. ?You have pus or other fluid coming from your ear. ?You have  hearing loss. ?You have ringing in your ears that does not go away. ?You feel like the room is spinning (vertigo). ?Your symptoms do not improve with treatment. ?Get help right away if: ?You have bleeding from the affected ear. ?You have severe ear pain. ?Summary ?Earwax can build up in the ear and cause discomfort or hearing loss. ?The most common symptoms of this condition include reduced or muffled hearing, a  feeling of fullness in the ear, or feeling that the ear is plugged. ?This condition may be diagnosed based on your symptoms, your medical history, and an ear exam. ?This condition may be treated by using ear drops to soften the earwax or by having the earwax removed by a health care provider. ?Do not put any objects, including cotton swabs, into your ear. You can clean the opening of your ear canal with a washcloth or facial tissue. ?This information is not intended to replace advice given to you by your health care provider. Make sure you discuss any questions you have with your health care provider. ?Document Revised: 08/22/2019 Document Reviewed: 08/22/2019 ?Elsevier Patient Education ? [place]. ? ?Preventive Care 67-55 Years Old, Male ?Preventive care refers to lifestyle choices and visits with your health care provider that can promote health and wellness. Preventive care visits are also called wellness exams. ?What can I expect for my preventive care visit? ?Counseling ?During your preventive care visit, your health care provider may ask about your: ?Medical history, including: ?Past medical problems. ?Family medical history. ?Current health, including: ?Emotional well-being. ?Home life and relationship well-being. ?Sexual activity. ?Lifestyle, including: ?Alcohol, nicotine or tobacco, and drug use. ?Access to firearms. ?Diet, exercise, and sleep habits. ?Safety issues such as seatbelt and bike helmet use. ?Sunscreen use. ?Work and work Statistician. ?Physical exam ?Your health care provider will check your: ?Height and weight. These may be used to calculate your BMI (body mass index). BMI is a measurement that tells if you are at a healthy weight. ?Waist circumference. This measures the distance around your waistline. This measurement also tells if you are at a healthy weight and may help predict your risk of certain diseases, such as type 2 diabetes and high blood pressure. ?Heart rate and blood  pressure. ?Body temperature. ?Skin for abnormal spots. ?What immunizations do I need? ? ?Vaccines are usually given at various ages, according to a schedule. Your health care provider will recommend vaccines for you based on your age, medical history, and lifestyle or other factors, such as travel or where you work. ?What tests do I need? ?Screening ?Your health care provider may recommend screening tests for certain conditions. This may include: ?Lipid and cholesterol levels. ?Diabetes screening. This is done by checking your blood sugar (glucose) after you have not eaten for a while (fasting). ?Hepatitis B test. ?Hepatitis C test. ?HIV (human immunodeficiency virus) test. ?STI (sexually transmitted infection) testing, if you are at risk. ?Lung cancer screening. ?Prostate cancer screening. ?Colorectal cancer screening. ?Talk with your health care provider about your test results, treatment options, and if necessary, the need for more tests. ?Follow these instructions at home: ?Eating and drinking ? ?Eat a diet that includes fresh fruits and vegetables, whole grains, lean protein, and low-fat dairy products. ?Take vitamin and mineral supplements as recommended by your health care provider. ?Do not drink alcohol if your health care provider tells you not to drink. ?If you drink alcohol: ?Limit how much you have to 0-2 drinks a day. ?Know how much alcohol is in  your drink. In the [place] one drink equals one 12 oz bottle of beer (355 mL), one 5 oz glass of wine (148 mL), or one 1? oz glass of hard liquor (44 mL). ?Lifestyle ?Brush your teeth every morning and night with fluoride toothpaste. Floss one time each day. ?Exercise for at least 30 minutes 5 or more days each week. ?Do not use any products that contain nicotine or tobacco. These products include cigarettes, chewing tobacco, and vaping devices, such as e-cigarettes. If you need help quitting, ask your health care provider. ?Do not use drugs. ?If you are sexually  active, practice safe sex. Use a condom or other form of protection to prevent STIs. ?Take aspirin only as told by your health care provider. Make sure that you understand how much to take and what form to ta

## 2021-09-01 NOTE — Progress Notes (Signed)
? ?Subjective:  ?Patient ID: Marcus Hart, male    DOB: 10-30-1965  Age: 56 y.o. MRN: [id number] ? ?CC:  ?Chief Complaint  ?Patient presents with  ? Annual Exam  ?  Pt notes he has not had anything to eat after 5:30 am today, no other questions   ? ? ?HPI ?Marcus Hart presents for Annual Exam and follow-up diabetes ? ?Diabetes: ?With hyperglycemia.  Last treated in February.  Had been out of Trulicity at that time, but was tolerating 3 mg previously.  Continue to take metformin at that time.  Uncontrolled readings last visit.  He just started his Victoza 1 week prior.  Was planning to increased Victoza to the 1.2 mg dose - now on that dose daily. Metformin '1000mg'$  BID.  ?Home readings fasting: 110-120 ?Home readings postprandial: 160-180, rare 200.   ?Symptomatic lows: none.  ?Feeling better.  ?End of fast for Ramadan this week.  ?On statin.  ?No new se's with meds  ? ?Lab Results  ?Component Value Date  ? HGBA1C 8.3 (H) 07/03/2021  ? HGBA1C 7.8 (A) 02/14/2021  ? HGBA1C 7.1 (H) 09/19/2020  ? ?Lab Results  ?Component Value Date  ? MICROALBUR <0.7 09/19/2020  ? [place] [address] 07/03/2021  ? CREATININE 0.76 07/03/2021  ? ? ? ? ?  09/01/2021  ?  9:18 AM 02/14/2021  ?  8:52 AM 09/19/2020  ?  3:19 PM 06/21/2020  ?  3:15 PM 03/15/2020  ?  3:14 PM  ?Depression screen PHQ 2/9  ?Decreased Interest 0 0 0 0 0  ?Down, Depressed, Hopeless 0 0 0 0 0  ?PHQ - 2 Score 0 0 0 0 0  ? ? ?Health Maintenance  ?Topic Date Due  ? COVID-19 Vaccine (1) Never done  ? Zoster Vaccines- Shingrix (2 of 2) 04/11/2021  ? URINE MICROALBUMIN  09/19/2021  ? INFLUENZA VACCINE  12/16/2021  ? HEMOGLOBIN A1C  12/31/2021  ? OPHTHALMOLOGY EXAM  05/28/2022  ? FOOT EXAM  09/02/2022  ? TETANUS/TDAP  02/14/2023  ? COLONOSCOPY (Pts 45-33yr Insurance coverage will need to be confirmed)  10/28/2027  ? Hepatitis C Screening  Completed  ? HIV Screening  Completed  ? HPV VACCINES  Aged Out  ?Colonoscopy 10/2017 - single inflammatory polyp. Dr. DLoletha Carrow Repeat 10 years.   ?Prostate: does not have family history of prostate cancer ?The natural history of prostate cancer and ongoing controversy regarding screening and potential treatment outcomes of prostate cancer has been discussed with the patient. The meaning of a false positive PSA and a false negative PSA has been discussed. He indicates understanding of the limitations of this screening test and wishes NOT to proceed with screening PSA testing. ?Lab Results  ?Component Value Date  ? PSA1 0.3 06/18/2017  ? PSA 0.33 07/24/2015  ? PSA 0.25 02/13/2013  ? ? ?Immunization History  ?Administered Date(s) Administered  ? Influenza,inj,Quad PF,6+ Mos 02/13/2013, 05/07/2014, 04/06/2015  ? Influenza-Unspecified 02/13/2013, 05/07/2014, 04/06/2015  ? Meningococcal B Recombinant 09/24/2017  ? Meningococcal Conjugate 09/24/2017  ? Meningococcal Mcv4o 09/08/2014  ? Pneumococcal Polysaccharide-23 06/18/2017  ? Tdap 02/13/2013  ? Zoster Recombinat (Shingrix) 02/14/2021  ?2nd shingrix today  ? ?No results found. ?Regular care with optho.  ? ?Dental:Yes and Within Last 6 months ? ?Alcohol:none.  ? ?Tobacco: none.  ? ?Exercise:minimal. Recommended.  ? ? ?History ?Patient Active Problem List  ? Diagnosis Date Noted  ? DM2 (diabetes mellitus, type 2) ([place] 02/13/2013  ? Other and unspecified hyperlipidemia 02/13/2013  ? ?Past Medical History:  ?  Diagnosis Date  ? Diabetes mellitus ([place])   ? Diabetes mellitus without complication ([place])   ? Phreesia 11/10/2019  ? Hyperlipidemia   ? ?Past Surgical History:  ?Procedure Laterality Date  ? COLONOSCOPY    ? NO PAST SURGERIES    ? UPPER GASTROINTESTINAL ENDOSCOPY    ? ?No Known Allergies ?Prior to Admission medications   ?Medication Sig Start Date End Date Taking? Authorizing Provider  ?aspirin 81 MG tablet Take 81 mg by mouth daily.   Yes [provider]  ?glucose blood (ONETOUCH VERIO) test strip With lancets - #100, 3 rf. USE AS INSTRUCTED - 07/03/21  Yes [name], MD  ?Insulin Pen Needle  (PEN NEEDLES) 32G X 4 MM MISC Use with Victoza to inject once daily 06/30/21  Yes [name], MD  ?liraglutide (VICTOZA) 18 MG/3ML SOPN INJECT 0.6 MG UNDER THE SKIN ONCE DAILY FOR 7 DAYS, THEN INCREASE TO 1.2 MG ONCE DAILY 06/25/21  Yes [name], MD  ?metFORMIN (GLUCOPHAGE) 1000 MG tablet Take 1 tab by mouth 2 time daily with a meal. 03/28/21  Yes [name], MD  ?rosuvastatin (CRESTOR) 10 MG tablet TAKE 1 TABLET(10 MG) BY MOUTH DAILY 03/28/21  Yes [name], MD  ?bismuth subsalicylate (PEPTO BISMOL) 262 MG chewable tablet Chew 2 tablets (524 mg total) by mouth 4 (four) times daily. ?Patient not taking: Reported on 07/03/2021 12/17/20   Doran Stabler, MD  ? ?Social History  ? ?Socioeconomic History  ? Marital status: Married  ?  Spouse name: [name]  ? Number of children: 4  ? Years of education: Not on file  ? Highest education level: Not on file  ?Occupational History  ? Occupation: Mining engineer  ?Tobacco Use  ? Smoking status: Never  ? Smokeless tobacco: Never  ?Vaping Use  ? Vaping Use: Never used  ?Substance and Sexual Activity  ? Alcohol use: Never  ?  Alcohol/week: 0.0 standard drinks  ? Drug use: No  ? Sexual activity: Yes  ?Other Topics Concern  ? Not on file  ?Social History Narrative  ? Married  ? Education: College  ? Exercise: No  ? ?Social Determinants of Health  ? ?Financial Resource Strain: Not on file  ?Food Insecurity: Not on file  ?Transportation Needs: Not on file  ?Physical Activity: Not on file  ?Stress: Not on file  ?Social Connections: Not on file  ?Intimate Partner Violence: Not on file  ? ? ?Review of Systems ? ?13 point review of systems per patient health survey noted.  Negative other than as indicated above or in HPI.  ? ?Objective:  ? ?Vitals:  ? 09/01/21 0916  ?BP: 130/82  ?Pulse: (!) 56  ?Resp: 17  ?Temp: 98.2 ?F (36.8 ?C)  ?TempSrc: Temporal  ?SpO2: 99%  ?Weight: 172 lb 12.8 oz (78.4 kg)  ?Height: '5\' 9"'$  (1.753 m)  ? ? ? ?Physical Exam ?Vitals reviewed.   ?Constitutional:   ?   Appearance: He is well-developed.  ?HENT:  ?   Head: Normocephalic and atraumatic.  ?   Right Ear: External ear normal.  ?   Left Ear: External ear normal.  ?Eyes:  ?   Conjunctiva/sclera: Conjunctivae normal.  ?   Pupils: Pupils are equal, round, and reactive to light.  ?Neck:  ?   Thyroid: No thyromegaly.  ?Cardiovascular:  ?   Rate and Rhythm: Normal rate and regular rhythm.  ?   Heart sounds: Normal heart sounds.  ?Pulmonary:  ?  Effort: Pulmonary effort is normal. No respiratory distress.  ?   Breath sounds: Normal breath sounds. No wheezing.  ?Abdominal:  ?   General: There is no distension.  ?   Palpations: Abdomen is soft.  ?   Tenderness: There is no abdominal tenderness.  ?Musculoskeletal:     ?   General: No tenderness. Normal range of motion.  ?   Cervical back: Normal range of motion and neck supple.  ?Lymphadenopathy:  ?   Cervical: No cervical adenopathy.  ?Skin: ?   General: Skin is warm and dry.  ?Neurological:  ?   Mental Status: He is alert and oriented to person, place, and time.  ?   Deep Tendon Reflexes: Reflexes are normal and symmetric.  ?Psychiatric:     ?   Behavior: Behavior normal.  ? ? ? ? ? ?Assessment & Plan:  ?Kimball Galloway is a 56 y.o. male . ?Annual physical exam ? - -anticipatory guidance as below in AVS, screening labs above. Health maintenance items as above in HPI discussed/recommended as applicable.  ? ?Type 2 diabetes mellitus with hyperglycemia, without long-term current use of insulin ([place]) - Plan: Microalbumin / creatinine urine ratio, Hemoglobin A1c, liraglutide (VICTOZA) 18 MG/3ML SOPN, metFORMIN (GLUCOPHAGE) 1000 MG tablet ? -Improving control.  Continue same dose Victoza, check updated A1c in the next 1 month to decide on changes.  90-monthin person follow-up. ? ?Need for shingles vaccine - Plan: Varicella-zoster vaccine IM -second dose given ? ?Hyperlipidemia, unspecified hyperlipidemia type - Plan: rosuvastatin (CRESTOR) 10 MG  tablet ? -Tolerating current dose Crestor, continue same. ? ?Excessive cerumen in ear canal, right ? -Excessive cerumen without complete obstruction.  Handout given on home management, RTC precautions if hearing loss or complete obst

## 2021-09-02 DIAGNOSIS — Z23 Encounter for immunization: Secondary | ICD-10-CM | POA: Diagnosis not present

## 2021-09-03 ENCOUNTER — Encounter: Payer: Self-pay | Admitting: Family Medicine

## 2021-10-02 ENCOUNTER — Ambulatory Visit: Payer: 59 | Admitting: Family Medicine

## 2021-10-15 ENCOUNTER — Ambulatory Visit (INDEPENDENT_AMBULATORY_CARE_PROVIDER_SITE_OTHER): Payer: 59 | Admitting: Family Medicine

## 2021-10-15 ENCOUNTER — Encounter: Payer: Self-pay | Admitting: Family Medicine

## 2021-10-15 VITALS — BP 128/74 | HR 69 | Temp 98.8°F | Resp 16 | Ht 69.0 in | Wt 170.8 lb

## 2021-10-15 DIAGNOSIS — H6121 Impacted cerumen, right ear: Secondary | ICD-10-CM

## 2021-10-15 DIAGNOSIS — E1165 Type 2 diabetes mellitus with hyperglycemia: Secondary | ICD-10-CM | POA: Diagnosis not present

## 2021-10-15 LAB — POCT GLYCOSYLATED HEMOGLOBIN (HGB A1C): Hemoglobin A1C: 7.1 % — AB (ref 4.0–5.6)

## 2021-10-15 NOTE — Progress Notes (Signed)
Subjective:  Patient ID: Marcus Hart, male    DOB: September 07, 1965  Age: 56 y.o. MRN: [id number]  CC:  Chief Complaint  Patient presents with   Diabetes    Pt here for diabetes recheck. Pt has no concerns considering diabetes.   Ear Pain    Pt states that he is having ear pain for 2 years now. Pt states that the pain comes and goes. Pt states that right now it is primarily the right ear but both are bothering him in general.     HPI Marcus Hart presents for   Diabetes: Complicated by hyperglycemia.  Uncontrolled in February but out of Trulicity at that time.  Treated with metformin 1000 mg p.o. twice daily, Victoza 1.2 mg daily, no n/v, abd pain, diarrhea or se's of meds Home reading fasting -  Low of 88 up to 130.  Postprandial max 177.  No symptomatic lows.  Microalbumin: Normal ratio 09/01/2021 Optho, foot exam, pneumovax: Up-to-date. He is on statin. Exercise - physical job.  Avoiding sweets - minimal.    Lab Results  Component Value Date   HGBA1C 7.1 (A) 10/15/2021   HGBA1C 8.3 (H) 07/03/2021   HGBA1C 7.8 (A) 02/14/2021   Lab Results  Component Value Date   MICROALBUR <0.7 09/01/2021   LDLCALC 84 07/03/2021   CREATININE 0.76 07/03/2021   Ear blockage. Wax remover otc in past. Helps some, but comes back. Feels blocked if lying on R side. Left side better - no recent pain or issues on left.  No pain in ears. Hearing ok - only muffled with wax in ears.  Has used q tips to clean R ear at times.  Blocked  on R - past week, better today after cleaning it. No recent otc treatment.   History Patient Active Problem List   Diagnosis Date Noted   DM2 (diabetes mellitus, type 2) ([place]) 02/13/2013   Other and unspecified hyperlipidemia 02/13/2013   Past Medical History:  Diagnosis Date   Diabetes mellitus ([place])    Diabetes mellitus without complication ([place])    Phreesia 11/10/2019   Hyperlipidemia    Past Surgical History:  Procedure Laterality Date   COLONOSCOPY      NO PAST SURGERIES     UPPER GASTROINTESTINAL ENDOSCOPY     No Known Allergies Prior to Admission medications   Medication Sig Start Date End Date Taking? Authorizing Provider  glucose blood (ONETOUCH VERIO) test strip With lancets - #100, 3 rf. USE AS INSTRUCTED - 07/03/21  Yes [name], MD  Insulin Pen Needle (PEN NEEDLES) 32G X 4 MM MISC Use with Victoza to inject once daily 06/30/21  Yes [name], MD  liraglutide (VICTOZA) 18 MG/3ML SOPN Inject 1.2 mg into the skin daily. 09/01/21  Yes [name], MD  metFORMIN (GLUCOPHAGE) 1000 MG tablet Take 1 tab by mouth 2 time daily with a meal. 09/01/21  Yes [name], MD  rosuvastatin (CRESTOR) 10 MG tablet TAKE 1 TABLET(10 MG) BY MOUTH DAILY 09/01/21  Yes [name], MD  aspirin 81 MG tablet Take 81 mg by mouth daily. Patient not taking: Reported on 10/15/2021    [provider]   Social History   Socioeconomic History   Marital status: Married    Spouse name: [name]   Number of children: 4   Years of education: Not on file   Highest education level: Not on file  Occupational History   Occupation: Mining engineer  Tobacco Use  Smoking status: Never   Smokeless tobacco: Never  Vaping Use   Vaping Use: Never used  Substance and Sexual Activity   Alcohol use: Never    Alcohol/week: 0.0 standard drinks   Drug use: No   Sexual activity: Yes  Other Topics Concern   Not on file  Social History Narrative   Married   Education: Secretary/administrator   Exercise: No   Social Determinants of Radio broadcast assistant Strain: Not on file  Food Insecurity: Not on file  Transportation Needs: Not on file  Physical Activity: Not on file  Stress: Not on file  Social Connections: Not on file  Intimate Partner Violence: Not on file    Review of Systems  Constitutional:  Negative for fatigue and unexpected weight change.  Eyes:  Negative for visual disturbance.  Respiratory:  Negative for cough, chest tightness  and shortness of breath.   Cardiovascular:  Negative for chest pain, palpitations and leg swelling.  Gastrointestinal:  Negative for abdominal pain and blood in stool.  Neurological:  Negative for dizziness, light-headedness and headaches.    Objective:   Vitals:   10/15/21 0942  BP: 128/74  Pulse: 69  Resp: 16  Temp: 98.8 F (37.1 C)  TempSrc: Temporal  SpO2: 98%  Weight: 170 lb 12.8 oz (77.5 kg)  Height: '5\' 9"'$  (1.753 m)     Physical Exam Vitals reviewed.  Constitutional:      Appearance: He is well-developed.  HENT:     Head: Normocephalic and atraumatic.     Right Ear: External ear normal. There is impacted cerumen.     Left Ear: Tympanic membrane, ear canal and external ear normal.     Ears:     Comments: Thick, dark impacted cerumen in right canal.  Unable to visualize TM.  Pinna nontender. Neck:     Vascular: No carotid bruit or JVD.  Cardiovascular:     Rate and Rhythm: Normal rate and regular rhythm.     Heart sounds: Normal heart sounds. No murmur heard. Pulmonary:     Effort: Pulmonary effort is normal.     Breath sounds: Normal breath sounds. No rales.  Musculoskeletal:     Right lower leg: No edema.     Left lower leg: No edema.  Skin:    General: Skin is warm and dry.  Neurological:     Mental Status: He is alert and oriented to person, place, and time.  Psychiatric:        Mood and Affect: Mood normal.       Assessment & Plan:  Marcus Hart is a 56 y.o. male . Type 2 diabetes mellitus with hyperglycemia, without long-term current use of insulin (HCC) - Plan: POCT glycosylated hemoglobin (Hb A1C)  -Improved control, near goal.  Continue same dose metformin, Victoza for now.  Continue to watch diet, activity.  Recheck in 3 months to decide on higher doses of Victoza if needed.  Impacted cerumen of right ear - Plan: CANCELED: Ear wax removal  -new concern/problem. Handout given on excess cerumen.  Advised against use of cotton tip swabs within  the ear canal.  Due to time constraints unable to have procedure for cerumen lavage today.  Will return on Friday for lavage of right ear.  Prevention techniques including over-the-counter formulations discussed in the future.  No orders of the defined types were placed in this encounter.  Patient Instructions  Diabetes numbers look good overall - no med changes for now. Recheck in  3 months.  Avoid cotton tipped swabs to ear. See info below for earwax buildup.   Earwax Buildup, Adult The ears produce a substance called earwax that helps keep bacteria out of the ear and protects the skin in the ear canal. Occasionally, earwax can build up in the ear and cause discomfort or hearing loss. What are the causes? This condition is caused by a buildup of earwax. Ear canals are self-cleaning. Ear wax is made in the outer part of the ear canal and generally falls out in small amounts over time. When the self-cleaning mechanism is not working, earwax builds up and can cause decreased hearing and discomfort. Attempting to clean ears with cotton swabs can push the earwax deep into the ear canal and cause decreased hearing and pain. What increases the risk? This condition is more likely to develop in people who: Clean their ears often with cotton swabs. Pick at their ears. Use earplugs or in-ear headphones often, or wear hearing aids. The following factors may also make you more likely to develop this condition: Being male. Being of older age. Naturally producing more earwax. Having narrow ear canals. Having earwax that is overly thick or sticky. Having excess hair in the ear canal. Having eczema. Being dehydrated. What are the signs or symptoms? Symptoms of this condition include: Reduced or muffled hearing. A feeling of fullness in the ear or feeling that the ear is plugged. Fluid coming from the ear. Ear pain or an itchy ear. Ringing in the ear. Coughing. Balance problems. An obvious piece  of earwax that can be seen inside the ear canal. How is this diagnosed? This condition may be diagnosed based on: Your symptoms. Your medical history. An ear exam. During the exam, your health care provider will look into your ear with an instrument called an otoscope. You may have tests, including a hearing test. How is this treated? This condition may be treated by: Using ear drops to soften the earwax. Having the earwax removed by a health care provider. The health care provider may: Flush the ear with water. Use an instrument that has a loop on the end (curette). Use a suction device. Having surgery to remove the wax buildup. This may be done in severe cases. Follow these instructions at home:  Take over-the-counter and prescription medicines only as told by your health care provider. Do not put any objects, including cotton swabs, into your ear. You can clean the opening of your ear canal with a washcloth or facial tissue. Follow instructions from your health care provider about cleaning your ears. Do not overclean your ears. Drink enough fluid to keep your urine pale yellow. This will help to thin the earwax. Keep all follow-up visits as told. If earwax builds up in your ears often or if you use hearing aids, consider seeing your health care provider for routine, preventive ear cleanings. Ask your health care provider how often you should schedule your cleanings. If you have hearing aids, clean them according to instructions from the manufacturer and your health care provider. Contact a health care provider if: You have ear pain. You develop a fever. You have pus or other fluid coming from your ear. You have hearing loss. You have ringing in your ears that does not go away. You feel like the room is spinning (vertigo). Your symptoms do not improve with treatment. Get help right away if: You have bleeding from the affected ear. You have severe ear pain. Summary Earwax can  build  up in the ear and cause discomfort or hearing loss. The most common symptoms of this condition include reduced or muffled hearing, a feeling of fullness in the ear, or feeling that the ear is plugged. This condition may be diagnosed based on your symptoms, your medical history, and an ear exam. This condition may be treated by using ear drops to soften the earwax or by having the earwax removed by a health care provider. Do not put any objects, including cotton swabs, into your ear. You can clean the opening of your ear canal with a washcloth or facial tissue. This information is not intended to replace advice given to you by your health care provider. Make sure you discuss any questions you have with your health care provider. Document Revised: 08/22/2019 Document Reviewed: 08/22/2019 Elsevier Patient Education  [place],   [name], MD [place] 10/15/21 11:00 AM

## 2021-10-15 NOTE — Patient Instructions (Addendum)
Diabetes numbers look good overall - no med changes for now. Recheck in 3 months.  Avoid cotton tipped swabs to ear. See info below for earwax buildup.   Earwax Buildup, Adult The ears produce a substance called earwax that helps keep bacteria out of the ear and protects the skin in the ear canal. Occasionally, earwax can build up in the ear and cause discomfort or hearing loss. What are the causes? This condition is caused by a buildup of earwax. Ear canals are self-cleaning. Ear wax is made in the outer part of the ear canal and generally falls out in small amounts over time. When the self-cleaning mechanism is not working, earwax builds up and can cause decreased hearing and discomfort. Attempting to clean ears with cotton swabs can push the earwax deep into the ear canal and cause decreased hearing and pain. What increases the risk? This condition is more likely to develop in people who: Clean their ears often with cotton swabs. Pick at their ears. Use earplugs or in-ear headphones often, or wear hearing aids. The following factors may also make you more likely to develop this condition: Being male. Being of older age. Naturally producing more earwax. Having narrow ear canals. Having earwax that is overly thick or sticky. Having excess hair in the ear canal. Having eczema. Being dehydrated. What are the signs or symptoms? Symptoms of this condition include: Reduced or muffled hearing. A feeling of fullness in the ear or feeling that the ear is plugged. Fluid coming from the ear. Ear pain or an itchy ear. Ringing in the ear. Coughing. Balance problems. An obvious piece of earwax that can be seen inside the ear canal. How is this diagnosed? This condition may be diagnosed based on: Your symptoms. Your medical history. An ear exam. During the exam, your health care provider will look into your ear with an instrument called an otoscope. You may have tests, including a hearing  test. How is this treated? This condition may be treated by: Using ear drops to soften the earwax. Having the earwax removed by a health care provider. The health care provider may: Flush the ear with water. Use an instrument that has a loop on the end (curette). Use a suction device. Having surgery to remove the wax buildup. This may be done in severe cases. Follow these instructions at home:  Take over-the-counter and prescription medicines only as told by your health care provider. Do not put any objects, including cotton swabs, into your ear. You can clean the opening of your ear canal with a washcloth or facial tissue. Follow instructions from your health care provider about cleaning your ears. Do not overclean your ears. Drink enough fluid to keep your urine pale yellow. This will help to thin the earwax. Keep all follow-up visits as told. If earwax builds up in your ears often or if you use hearing aids, consider seeing your health care provider for routine, preventive ear cleanings. Ask your health care provider how often you should schedule your cleanings. If you have hearing aids, clean them according to instructions from the manufacturer and your health care provider. Contact a health care provider if: You have ear pain. You develop a fever. You have pus or other fluid coming from your ear. You have hearing loss. You have ringing in your ears that does not go away. You feel like the room is spinning (vertigo). Your symptoms do not improve with treatment. Get help right away if: You have bleeding from  the affected ear. You have severe ear pain. Summary Earwax can build up in the ear and cause discomfort or hearing loss. The most common symptoms of this condition include reduced or muffled hearing, a feeling of fullness in the ear, or feeling that the ear is plugged. This condition may be diagnosed based on your symptoms, your medical history, and an ear exam. This condition  may be treated by using ear drops to soften the earwax or by having the earwax removed by a health care provider. Do not put any objects, including cotton swabs, into your ear. You can clean the opening of your ear canal with a washcloth or facial tissue. This information is not intended to replace advice given to you by your health care provider. Make sure you discuss any questions you have with your health care provider. Document Revised: 08/22/2019 Document Reviewed: 08/22/2019 Elsevier Patient Education  [place].

## 2021-10-17 ENCOUNTER — Encounter: Payer: Self-pay | Admitting: Family Medicine

## 2021-10-17 ENCOUNTER — Ambulatory Visit (INDEPENDENT_AMBULATORY_CARE_PROVIDER_SITE_OTHER): Payer: 59 | Admitting: Family Medicine

## 2021-10-17 VITALS — BP 126/80 | HR 70 | Temp 98.3°F | Resp 16 | Ht 69.0 in | Wt 171.0 lb

## 2021-10-17 DIAGNOSIS — H6121 Impacted cerumen, right ear: Secondary | ICD-10-CM

## 2021-10-17 NOTE — Progress Notes (Signed)
Here for lavage for impacted cerumen of right ear.  Discussed at office visit few days ago.  No changes since that time or self treatment since that time.  Left canal clear, right ear external ear nontender, canal obstructed with dark yellow cerumen but no discharge, erythema or apparent canal edema.   Potential risks versus benefits and potential complications discussed with lavage.  Understanding expressed, all questions were answered, consent obtained.  Ear lavage performed by assistant with without complications.  Hearing restored after the procedure, RTC/urgent care precautions discussed.

## 2021-11-02 ENCOUNTER — Other Ambulatory Visit: Payer: Self-pay | Admitting: Family Medicine

## 2022-01-07 ENCOUNTER — Other Ambulatory Visit: Payer: Self-pay | Admitting: Family Medicine

## 2022-01-07 DIAGNOSIS — E1165 Type 2 diabetes mellitus with hyperglycemia: Secondary | ICD-10-CM

## 2022-01-07 NOTE — Telephone Encounter (Signed)
Patient is requesting a refill of the following medications: Requested Prescriptions   Pending Prescriptions Disp Refills   [place] 18 MG/3ML SOPN [Pharmacy Med Name: VICTOZA '18MG'$ /3ML INJ PEN 3ML(2PACK)] 18 mL     Sig: ADMINISTER 1.2 MG UNDER THE SKIN DAILY    Date of patient request: 01/07/22 Last office visit: 10/17/21 Date of last refill: 09/01/21 Last refill amount: 63m x5 Follow up time period per chart: 3 months

## 2022-01-22 ENCOUNTER — Encounter: Payer: Self-pay | Admitting: Family Medicine

## 2022-01-22 ENCOUNTER — Ambulatory Visit (INDEPENDENT_AMBULATORY_CARE_PROVIDER_SITE_OTHER): Payer: Commercial Managed Care - HMO | Admitting: Family Medicine

## 2022-01-22 VITALS — BP 122/72 | HR 69 | Temp 98.1°F | Ht 69.0 in | Wt 177.6 lb

## 2022-01-22 DIAGNOSIS — E785 Hyperlipidemia, unspecified: Secondary | ICD-10-CM | POA: Diagnosis not present

## 2022-01-22 DIAGNOSIS — E1165 Type 2 diabetes mellitus with hyperglycemia: Secondary | ICD-10-CM

## 2022-01-22 DIAGNOSIS — I10 Essential (primary) hypertension: Secondary | ICD-10-CM

## 2022-01-22 LAB — COMPREHENSIVE METABOLIC PANEL
ALT: 22 U/L (ref 0–53)
AST: 20 U/L (ref 0–37)
Albumin: 4.2 g/dL (ref 3.5–5.2)
Alkaline Phosphatase: 49 U/L (ref 39–117)
BUN: 13 mg/dL (ref 6–23)
CO2: 27 mEq/L (ref 19–32)
Calcium: 9.4 mg/dL (ref 8.4–10.5)
Chloride: 102 mEq/L (ref 96–112)
Creatinine, Ser: 0.86 mg/dL (ref 0.40–1.50)
GFR: 97.65 mL/min (ref >60.00)
Glucose, Bld: 145 mg/dL — ABNORMAL HIGH (ref 70–99)
Potassium: 4.5 mEq/L (ref 3.5–5.1)
Sodium: 135 mEq/L (ref 135–145)
Total Bilirubin: 0.6 mg/dL (ref 0.2–1.2)
Total Protein: 7 g/dL (ref 6.0–8.3)

## 2022-01-22 LAB — LIPID PANEL
Cholesterol: 195 mg/dL (ref 0–200)
HDL: 68.3 mg/dL (ref >39.00)
LDL Cholesterol: 114 mg/dL — ABNORMAL HIGH (ref 0–99)
NonHDL: 126.43
Total CHOL/HDL Ratio: 3
Triglycerides: 64 mg/dL (ref 0.0–149.0)
VLDL: 12.8 mg/dL (ref 0.0–40.0)

## 2022-01-22 LAB — POCT GLYCOSYLATED HEMOGLOBIN (HGB A1C): Hemoglobin A1C: 8.9 % — AB (ref 4.0–5.6)

## 2022-01-22 LAB — GLUCOSE, POCT (MANUAL RESULT ENTRY): POC Glucose: 147 mg/dl — AB (ref 70–99)

## 2022-01-22 MED ORDER — METFORMIN HCL 1000 MG PO TABS: ORAL_TABLET | ORAL | 2 refills | Status: DC

## 2022-01-22 MED ORDER — ROSUVASTATIN CALCIUM 10 MG PO TABS: ORAL_TABLET | ORAL | 2 refills | Status: DC

## 2022-01-22 NOTE — Progress Notes (Signed)
Subjective:  Patient ID: Marcus Hart, male    DOB: 13-Apr-1966  Age: 56 y.o. MRN: [id number]  CC:  Chief Complaint  Patient presents with   Diabetes    Pt states cant use victoza due to insurance and states metforman isnt working  Pt is fasting    HPI Marcus Hart presents for   Diabetes: Complicated by hyperglycemia, treated with metformin 1000 mg twice daily, Victoza 1.2 mg daily.  Improved control in May. Unfortunately has been off Victoza past month. Prior Friday insurance, but now with [place]. Went to pharmacy yesterday, unable to get authorized? Working on paperwork.  I spoke with staff, prior authorization has been submitted and waiting on authorization from insurance and pharmacy.  Still taking metformin.  Fasting: 171 Home readings postprandial 300.no n/v/blurry vision or abd pain.  No symptomatic lows. Marcus Hart is on statin daily without new se's Microalbumin: Normal ratio 09/01/2021 Optho, foot exam, pneumovax: up to date.   Lab Results  Component Value Date   HGBA1C 8.9 (A) 01/22/2022   HGBA1C 7.1 (A) 10/15/2021   HGBA1C 8.3 (H) 07/03/2021   Lab Results  Component Value Date   MICROALBUR <0.7 09/01/2021   [place] 84 07/03/2021   CREATININE 0.76 07/03/2021    History Patient Active Problem List   Diagnosis Date Noted   DM2 (diabetes mellitus, type 2) ([place]) 02/13/2013   Other and unspecified hyperlipidemia 02/13/2013   Past Medical History:  Diagnosis Date   Diabetes mellitus ([place])    Diabetes mellitus without complication ([place])    Phreesia 11/10/2019   Hyperlipidemia    Past Surgical History:  Procedure Laterality Date   COLONOSCOPY     NO PAST SURGERIES     UPPER GASTROINTESTINAL ENDOSCOPY     No Known Allergies Prior to Admission medications   Medication Sig Start Date End Date Taking? Authorizing Provider  glucose blood (ONETOUCH VERIO) test strip With lancets - #100, 3 rf. USE AS INSTRUCTED - 07/03/21  Yes [name], MD  Insulin Pen  Needle (PEN NEEDLES) 32G X 4 MM MISC Use with Victoza to inject once daily 06/30/21  Yes [name], MD  Lancets [facility] DELICA PLUS [id number]) MISC USE AS DIRECTED TO TEST BLOOD GLUCOSE 11/03/21  Yes [name], MD  metFORMIN (GLUCOPHAGE) 1000 MG tablet Take 1 tab by mouth 2 time daily with a meal. 09/01/21  Yes [name], MD  rosuvastatin (CRESTOR) 10 MG tablet TAKE 1 TABLET(10 MG) BY MOUTH DAILY 09/01/21  Yes [name], MD  liraglutide (VICTOZA) 18 MG/3ML SOPN Inject 1.2 mg into the skin daily. Patient not taking: Reported on 01/22/2022 01/07/22   [name], MD   Social History   Socioeconomic History   Marital status: Married    Spouse name: [name]   Number of children: 4   Years of education: Not on file   Highest education level: Not on file  Occupational History   Occupation: Mining engineer  Tobacco Use   Smoking status: Never   Smokeless tobacco: Never  Vaping Use   Vaping Use: Never used  Substance and Sexual Activity   Alcohol use: Never    Alcohol/week: 0.0 standard drinks of alcohol   Drug use: No   Sexual activity: Yes  Other Topics Concern   Not on file  Social History Narrative   Married   Education: Secretary/administrator   Exercise: No   Social Determinants of Radio broadcast assistant Strain: Not on file  Food Insecurity:  Not on file  Transportation Needs: Not on file  Physical Activity: Not on file  Stress: Not on file  Social Connections: Not on file  Intimate Partner Violence: Not on file    Review of Systems  Constitutional:  Negative for fatigue and unexpected weight change.  Eyes:  Negative for visual disturbance.  Respiratory:  Negative for cough, chest tightness and shortness of breath.   Cardiovascular:  Negative for chest pain, palpitations and leg swelling.  Gastrointestinal:  Negative for abdominal pain and blood in stool.  Neurological:  Negative for dizziness, light-headedness and headaches.     Objective:   Vitals:    01/22/22 0826  BP: 122/72  Pulse: 69  Temp: 98.1 F (36.7 C)  SpO2: 97%  Weight: 177 lb 9.6 oz (80.6 kg)  Height: '5\' 9"'$  (1.753 m)     Physical Exam Vitals reviewed.  Constitutional:      Appearance: Marcus Hart is well-developed.  HENT:     Head: Normocephalic and atraumatic.  Neck:     Vascular: No carotid bruit or JVD.  Cardiovascular:     Rate and Rhythm: Normal rate and regular rhythm.     Heart sounds: Normal heart sounds. No murmur heard. Pulmonary:     Effort: Pulmonary effort is normal.     Breath sounds: Normal breath sounds. No rales.  Musculoskeletal:     Right lower leg: No edema.     Left lower leg: No edema.  Skin:    General: Skin is warm and dry.  Neurological:     Mental Status: Marcus Hart is alert and oriented to person, place, and time.  Psychiatric:        Mood and Affect: Mood normal.      Results for orders placed or performed in visit on 01/22/22  POCT glucose (manual entry)  Result Value Ref Range   POC Glucose 147 (A) 70 - 99 mg/dl  POCT glycosylated hemoglobin (Hb A1C)  Result Value Ref Range   Hemoglobin A1C 8.9 (A) 4.0 - 5.6 %   HbA1c POC (<> result, manual entry)     HbA1c, POC (prediabetic range)     HbA1c, POC (controlled diabetic range)       Assessment & Plan:  Marcus Hart is a 56 y.o. male . Essential hypertension, benign - Plan: Comprehensive metabolic panel  Type 2 diabetes mellitus with hyperglycemia, without long-term current use of insulin (HCC) - Plan: POCT glucose (manual entry), POCT glycosylated hemoglobin (Hb A1C), metFORMIN (GLUCOPHAGE) 1000 MG tablet  Hyperlipidemia, unspecified hyperlipidemia type - Plan: rosuvastatin (CRESTOR) 10 MG tablet, Lipid panel  Uncontrolled diabetes by A1c, some high readings as above but in office reading not too high, asymptomatic.  Unfortunately has been out of Victoza but prior authorization was submitted yesterday, waiting for med to be authorized, refilled.  If that will be further delayed  beyond a few days, option of temporary glipizide until Victoza available.  If further readings in the 300s, advised to call and we will start additional med temporarily.  Continue metformin same dose for now, check labs above.  62-monthfollow-up.  RTC/ER precautions.  Meds ordered this encounter  Medications   metFORMIN (GLUCOPHAGE) 1000 MG tablet    Sig: Take 1 tab by mouth 2 time daily with a meal.    Dispense:  180 tablet    Refill:  2   rosuvastatin (CRESTOR) 10 MG tablet    Sig: TAKE 1 TABLET(10 MG) BY MOUTH DAILY    Dispense:  90  tablet    Refill:  2   Patient Instructions  My staff has helped with the prior authorization for the Victoza.  If you do not hear anything in the next few days regarding medication, let me know and we can send in a different medication temporarily to keep the blood sugar down.  If you have any further readings in the 300s, let me know and I can send a different medication and to keep those readings from going too high until the [place] arrives.  If any nausea, vomiting, abdominal pain, blurry vision or new symptoms be seen right away.  Continue metformin same dose.  Follow-up with me in 3 months but let me know if there are questions sooner.  Hang in there!    Signed,   [name], MD [place] 01/22/22 9:28 AM

## 2022-01-22 NOTE — Patient Instructions (Signed)
My staff has helped with the prior authorization for the Victoza.  If you do not hear anything in the next few days regarding medication, let me know and we can send in a different medication temporarily to keep the blood sugar down.  If you have any further readings in the 300s, let me know and I can send a different medication and to keep those readings from going too high until the [place] arrives.  If any nausea, vomiting, abdominal pain, blurry vision or new symptoms be seen right away.  Continue metformin same dose.  Follow-up with me in 3 months but let me know if there are questions sooner.  Hang in there!

## 2022-01-25 ENCOUNTER — Encounter: Payer: Self-pay | Admitting: Family Medicine

## 2022-01-25 DIAGNOSIS — E1165 Type 2 diabetes mellitus with hyperglycemia: Secondary | ICD-10-CM

## 2022-01-26 MED ORDER — ONETOUCH VERIO VI STRP: ORAL_STRIP | 0 refills | Status: DC

## 2022-01-26 NOTE — Telephone Encounter (Signed)
Pt insurance report Trulicity is now preferred if you could switch these.

## 2022-01-27 MED ORDER — ONETOUCH VERIO VI STRP: ORAL_STRIP | 0 refills | Status: DC

## 2022-01-27 MED ORDER — TRULICITY 3 MG/0.5ML ~~LOC~~ SOAJ: 3.0000 mg | SUBCUTANEOUS | 2 refills | Status: DC

## 2022-04-30 ENCOUNTER — Ambulatory Visit (INDEPENDENT_AMBULATORY_CARE_PROVIDER_SITE_OTHER): Payer: Commercial Managed Care - HMO | Admitting: Family Medicine

## 2022-04-30 ENCOUNTER — Encounter: Payer: Self-pay | Admitting: Family Medicine

## 2022-04-30 VITALS — BP 120/70 | HR 67 | Temp 98.0°F | Ht 69.0 in | Wt 174.2 lb

## 2022-04-30 DIAGNOSIS — E1165 Type 2 diabetes mellitus with hyperglycemia: Secondary | ICD-10-CM | POA: Diagnosis not present

## 2022-04-30 LAB — HEMOGLOBIN A1C: Hgb A1c MFr Bld: 7.7 % — ABNORMAL HIGH (ref 4.6–6.5)

## 2022-04-30 NOTE — Patient Instructions (Signed)
Thanks for coming in today.  I am glad the Trulicity is working.  Depending on your A1c, we do have the option to increase the dose slightly.  I will let you know.  No other changes for today.  Take care!

## 2022-04-30 NOTE — Progress Notes (Signed)
Subjective:  Patient ID: Marcus Hart, male    DOB: [birth year]  Age: 56 y.o. MRN: [id number]  CC:  Chief Complaint  Patient presents with   Diabetes    Pt states all is well    HPI Marcus Hart presents for   Diabetes: Complicated by hyperglycemia.  Treated with metformin 1000 mg twice daily, Victoza 1.2 mg daily, but had been off Victoza for the months prior to his last visit.  Insurance changes.  Working on prior authorization at last visit. .  Now on Trulicity 3 mg once per week past few months.  He is on statin with Crestor 10 mg daily. No new side effects.  Home readings fasting: 130-140 Postprandial:180-185. Not over 200 Symptomatic lows:none  Microalbumin: nl in April.  Optho, foot exam, pneumovax: UTD.  Declines flu vaccine.   Lab Results  Component Value Date   HGBA1C 8.9 (A) 01/22/2022   HGBA1C 7.1 (A) 10/15/2021   HGBA1C 8.3 (H) 07/03/2021   Lab Results  Component Value Date   MICROALBUR <0.7 09/01/2021   LDLCALC 114 (H) 01/22/2022   CREATININE 0.86 01/22/2022    History Patient Active Problem List   Diagnosis Date Noted   Type 2 diabetes mellitus without complication ([place]) 43/32/9518   Other and unspecified hyperlipidemia 02/13/2013   Past Medical History:  Diagnosis Date   Diabetes mellitus ([place])    Diabetes mellitus without complication ([place])    Phreesia 11/10/2019   Hyperlipidemia    Past Surgical History:  Procedure Laterality Date   COLONOSCOPY     NO PAST SURGERIES     UPPER GASTROINTESTINAL ENDOSCOPY     No Known Allergies Prior to Admission medications   Medication Sig Start Date End Date Taking? Authorizing Provider  Dulaglutide (TRULICITY) 3 AC/1.6SA SOPN Inject 3 mg as directed once a week. 01/27/22  Yes [name], MD  glucose blood (ONETOUCH VERIO) test strip With lancets - #100, 3 rf. USE AS INSTRUCTED - 01/27/22  Yes [name], MD  Insulin Pen Needle (PEN NEEDLES) 32G X 4 MM MISC Use with Victoza to inject once  daily 06/30/21  Yes [name], MD  Lancets (ONETOUCH DELICA PLUS [id number]) [place] USE AS DIRECTED TO TEST BLOOD GLUCOSE 11/03/21  Yes [name], MD  metFORMIN (GLUCOPHAGE) 1000 MG tablet Take 1 tab by mouth 2 time daily with a meal. 01/22/22  Yes [name], MD  rosuvastatin (CRESTOR) 10 MG tablet TAKE 1 TABLET(10 MG) BY MOUTH DAILY 01/22/22  Yes [name], MD   Social History   Socioeconomic History   Marital status: Married    Spouse name: [name]   Number of children: 4   Years of education: Not on file   Highest education level: Not on file  Occupational History   Occupation: Mining engineer  Tobacco Use   Smoking status: Never   Smokeless tobacco: Never  Vaping Use   Vaping Use: Never used  Substance and Sexual Activity   Alcohol use: Never    Alcohol/week: 0.0 standard drinks of alcohol   Drug use: No   Sexual activity: Yes  Other Topics Concern   Not on file  Social History Narrative   Married   Education: Secretary/administrator   Exercise: No   Social Determinants of Health   Financial Resource Strain: Not on file  Food Insecurity: Not on file  Transportation Needs: Not on file  Physical Activity: Not on file  Stress: Not on file  Social Connections: Not  on file  Intimate Partner Violence: Not on file    Review of Systems Per HPI.   Objective:   Vitals:   04/30/22 0815  BP: 120/70  Pulse: 67  Temp: 98 F (36.7 C)  SpO2: 100%  Weight: 174 lb 3.2 oz (79 kg)  Height: '5\' 9"'$  (1.753 m)     Physical Exam Vitals reviewed.  Constitutional:      Appearance: He is well-developed.  HENT:     Head: Normocephalic and atraumatic.  Neck:     Vascular: No carotid bruit or JVD.  Cardiovascular:     Rate and Rhythm: Normal rate and regular rhythm.     Heart sounds: Normal heart sounds. No murmur heard. Pulmonary:     Effort: Pulmonary effort is normal.     Breath sounds: Normal breath sounds. No rales.  Musculoskeletal:     Right lower leg: No edema.      Left lower leg: No edema.  Skin:    General: Skin is warm and dry.  Neurological:     Mental Status: He is alert and oriented to person, place, and time.  Psychiatric:        Mood and Affect: Mood normal.        Assessment & Plan:  Marcus Hart is a 56 y.o. male . Type 2 diabetes mellitus with hyperglycemia, without long-term current use of insulin (HCC) Prior hyperglycemia, decreased control with medication availability difficulty.  Now has been on Trulicity consistently at 3 mg dosing.  May need to increase to 4.5 mg, but will check A1c first.  51-monthfollow-up, with RTC precautions.  No orders of the defined types were placed in this encounter.  Patient Instructions  Thanks for coming in today.  I am glad the Trulicity is working.  Depending on your A1c, we do have the option to increase the dose slightly.  I will let you know.  No other changes for today.  Take care!    Signed,   JMerri Ray MD [facility] 04/30/22 8:54 AM

## 2022-05-12 ENCOUNTER — Other Ambulatory Visit: Payer: Self-pay | Admitting: Family Medicine

## 2022-05-12 DIAGNOSIS — E1165 Type 2 diabetes mellitus with hyperglycemia: Secondary | ICD-10-CM

## 2022-05-12 MED ORDER — TRULICITY 4.5 MG/0.5ML ~~LOC~~ SOAJ: 4.5000 mg | SUBCUTANEOUS | 2 refills | Status: DC

## 2022-05-12 NOTE — Progress Notes (Signed)
See labs 

## 2022-06-18 NOTE — Progress Notes (Signed)
This encounter was created in error - please disregard.

## 2022-06-29 ENCOUNTER — Telehealth: Payer: Self-pay | Admitting: Family Medicine

## 2022-06-29 ENCOUNTER — Other Ambulatory Visit: Payer: Self-pay

## 2022-06-29 DIAGNOSIS — E1165 Type 2 diabetes mellitus with hyperglycemia: Secondary | ICD-10-CM

## 2022-06-29 DIAGNOSIS — E785 Hyperlipidemia, unspecified: Secondary | ICD-10-CM

## 2022-06-29 MED ORDER — METFORMIN HCL 1000 MG PO TABS: ORAL_TABLET | ORAL | 1 refills | Status: DC

## 2022-06-29 MED ORDER — ROSUVASTATIN CALCIUM 10 MG PO TABS: ORAL_TABLET | ORAL | 2 refills | Status: DC

## 2022-06-29 MED ORDER — TRULICITY 4.5 MG/0.5ML ~~LOC~~ SOAJ: 4.5000 mg | SUBCUTANEOUS | 1 refills | Status: DC

## 2022-06-29 NOTE — Telephone Encounter (Signed)
Encourage patient to contact the pharmacy for refills or they can request refills through [facility] TO:  [facility] [place], [place] - [address] AT [place]:  rosuvastatin (CRESTOR) 10 MG tablet  metFORMIN (GLUCOPHAGE) 1000 MG tablet  Dulaglutide (TRULICITY) 4.5 0000000 SOPN   NOTES/COMMENTS FROM PATIENT:      [place] office please notify patient: It takes 48-72 hours to process rx refill requests Ask patient to call pharmacy to ensure rx is ready before heading there.

## 2022-07-23 ENCOUNTER — Telehealth: Payer: Self-pay | Admitting: Family Medicine

## 2022-07-23 ENCOUNTER — Other Ambulatory Visit: Payer: Self-pay

## 2022-07-23 DIAGNOSIS — E785 Hyperlipidemia, unspecified: Secondary | ICD-10-CM

## 2022-07-23 DIAGNOSIS — E1165 Type 2 diabetes mellitus with hyperglycemia: Secondary | ICD-10-CM

## 2022-07-23 MED ORDER — ROSUVASTATIN CALCIUM 10 MG PO TABS: ORAL_TABLET | ORAL | 2 refills | Status: DC

## 2022-07-23 MED ORDER — TRULICITY 4.5 MG/0.5ML ~~LOC~~ SOAJ: 4.5000 mg | SUBCUTANEOUS | 1 refills | Status: DC

## 2022-07-23 MED ORDER — METFORMIN HCL 1000 MG PO TABS: ORAL_TABLET | ORAL | 1 refills | Status: DC

## 2022-07-23 NOTE — Telephone Encounter (Signed)
Encourage patient to contact the pharmacy for refills or they can request refills through [facility]  (Please schedule appointment if patient has not been seen in over a year)  Last OV was 04/30/22  WHAT PHARMACY WOULD THEY LIKE THIS SENT TO: [id number] - [place], [place] - [address] AT [place] NAME & DOSE:Dulaglutide (TRULICITY) 4.5 [id number] (GLUCOPHAGE) 1000 MG tablet and rosuvastatin (CRESTOR) 10 MG tablet   NOTES/COMMENTS FROM PATIENT: Patient needs refill on this medication      Front office please notify patient: It takes 48-72 hours to process rx refill requests Ask patient to call pharmacy to ensure rx is ready before heading there.

## 2022-07-23 NOTE — Telephone Encounter (Signed)
Rx 's was sent in that pharmacy this morning

## 2022-07-23 NOTE — Telephone Encounter (Signed)
Refills sent to pharmacy. 

## 2022-07-23 NOTE — Telephone Encounter (Signed)
Patient call back stating that he want his medication to be sent to [facility] at [address] [place], [place] [address]

## 2022-07-31 ENCOUNTER — Other Ambulatory Visit: Payer: Self-pay

## 2022-07-31 ENCOUNTER — Ambulatory Visit: Payer: Self-pay | Admitting: Family Medicine

## 2022-07-31 DIAGNOSIS — E1165 Type 2 diabetes mellitus with hyperglycemia: Secondary | ICD-10-CM

## 2022-07-31 DIAGNOSIS — E785 Hyperlipidemia, unspecified: Secondary | ICD-10-CM

## 2022-07-31 MED ORDER — METFORMIN HCL 1000 MG PO TABS: ORAL_TABLET | ORAL | 1 refills | Status: DC

## 2022-07-31 MED ORDER — TRULICITY 4.5 MG/0.5ML ~~LOC~~ SOAJ: 4.5000 mg | SUBCUTANEOUS | 1 refills | Status: DC

## 2022-07-31 MED ORDER — ROSUVASTATIN CALCIUM 10 MG PO TABS: ORAL_TABLET | ORAL | 2 refills | Status: DC

## 2022-08-07 ENCOUNTER — Encounter: Payer: 59 | Admitting: Family Medicine

## 2022-08-07 ENCOUNTER — Telehealth: Payer: Self-pay | Admitting: Family Medicine

## 2022-08-07 NOTE — Telephone Encounter (Signed)
In office this am for appointment and had to leave for family emergency. Called pt to check on him and family.  Left message. He can call back for reschedule when possible and virtual is option if needed.

## 2022-08-08 NOTE — Progress Notes (Signed)
Patient left prior to being seen due to family emergency.  Appointment canceled. This encounter was created in error - please disregard. This encounter was created in error - please disregard.

## 2022-08-10 ENCOUNTER — Encounter: Payer: Self-pay | Admitting: Family Medicine

## 2022-08-10 ENCOUNTER — Ambulatory Visit (INDEPENDENT_AMBULATORY_CARE_PROVIDER_SITE_OTHER): Payer: 59 | Admitting: Family Medicine

## 2022-08-10 VITALS — BP 122/66 | HR 65 | Temp 97.9°F | Ht 69.0 in | Wt 176.6 lb

## 2022-08-10 DIAGNOSIS — E785 Hyperlipidemia, unspecified: Secondary | ICD-10-CM | POA: Diagnosis not present

## 2022-08-10 DIAGNOSIS — H6121 Impacted cerumen, right ear: Secondary | ICD-10-CM

## 2022-08-10 DIAGNOSIS — E1165 Type 2 diabetes mellitus with hyperglycemia: Secondary | ICD-10-CM | POA: Diagnosis not present

## 2022-08-10 LAB — COMPREHENSIVE METABOLIC PANEL
ALT: 12 U/L (ref 0–53)
AST: 15 U/L (ref 0–37)
Albumin: 4.2 g/dL (ref 3.5–5.2)
Alkaline Phosphatase: 46 U/L (ref 39–117)
BUN: 15 mg/dL (ref 6–23)
CO2: 26 mEq/L (ref 19–32)
Calcium: 9.5 mg/dL (ref 8.4–10.5)
Chloride: 100 mEq/L (ref 96–112)
Creatinine, Ser: 0.8 mg/dL (ref 0.40–1.50)
GFR: 99.43 mL/min (ref >60.00)
Glucose, Bld: 177 mg/dL — ABNORMAL HIGH (ref 70–99)
Potassium: 4.9 mEq/L (ref 3.5–5.1)
Sodium: 135 mEq/L (ref 135–145)
Total Bilirubin: 0.4 mg/dL (ref 0.2–1.2)
Total Protein: 6.7 g/dL (ref 6.0–8.3)

## 2022-08-10 LAB — HEMOGLOBIN A1C: Hgb A1c MFr Bld: 7.6 % — ABNORMAL HIGH (ref 4.6–6.5)

## 2022-08-10 LAB — LIPID PANEL
Cholesterol: 166 mg/dL (ref 0–200)
HDL: 58.8 mg/dL (ref >39.00)
LDL Cholesterol: 94 mg/dL (ref 0–99)
NonHDL: 107.04
Total CHOL/HDL Ratio: 3
Triglycerides: 67 mg/dL (ref 0.0–149.0)
VLDL: 13.4 mg/dL (ref 0.0–40.0)

## 2022-08-10 NOTE — Patient Instructions (Signed)
Home blood sugars sound much better.  No change in meds for now.  I will let you know if there are concerns on labs, recheck in 3 months.  See information below regarding cerumen impaction or excess wax.  You can try over-the-counter Debrox every few months to see if that may lessen the chance that it becomes impacted again.  Take care!  Return to the clinic or go to the nearest emergency room if any of your symptoms worsen or new symptoms occur.   Earwax Buildup, Adult The ears produce a substance called earwax that helps keep bacteria out of the ear and protects the skin in the ear canal. Occasionally, earwax can build up in the ear and cause discomfort or hearing loss. What are the causes? This condition is caused by a buildup of earwax. Ear canals are self-cleaning. Ear wax is made in the outer part of the ear canal and generally falls out in small amounts over time. When the self-cleaning mechanism is not working, earwax builds up and can cause decreased hearing and discomfort. Attempting to clean ears with cotton swabs can push the earwax deep into the ear canal and cause decreased hearing and pain. What increases the risk? This condition is more likely to develop in people who: Clean their ears often with cotton swabs. Pick at their ears. Use earplugs or in-ear headphones often, or wear hearing aids. The following factors may also make you more likely to develop this condition: Being male. Being of older age. Naturally producing more earwax. Having narrow ear canals. Having earwax that is overly thick or sticky. Having excess hair in the ear canal. Having eczema. Being dehydrated. What are the signs or symptoms? Symptoms of this condition include: Reduced or muffled hearing. A feeling of fullness in the ear or feeling that the ear is plugged. Fluid coming from the ear. Ear pain or an itchy ear. Ringing in the ear. Coughing. Balance problems. An obvious piece of earwax that  can be seen inside the ear canal. How is this diagnosed? This condition may be diagnosed based on: Your symptoms. Your medical history. An ear exam. During the exam, your health care provider will look into your ear with an instrument called an otoscope. You may have tests, including a hearing test. How is this treated? This condition may be treated by: Using ear drops to soften the earwax. Having the earwax removed by a health care provider. The health care provider may: Flush the ear with water. Use an instrument that has a loop on the end (curette). Use a suction device. Having surgery to remove the wax buildup. This may be done in severe cases. Follow these instructions at home:  Take over-the-counter and prescription medicines only as told by your health care provider. Do not put any objects, including cotton swabs, into your ear. You can clean the opening of your ear canal with a washcloth or facial tissue. Follow instructions from your health care provider about cleaning your ears. Do not overclean your ears. Drink enough fluid to keep your urine pale yellow. This will help to thin the earwax. Keep all follow-up visits as told. If earwax builds up in your ears often or if you use hearing aids, consider seeing your health care provider for routine, preventive ear cleanings. Ask your health care provider how often you should schedule your cleanings. If you have hearing aids, clean them according to instructions from the manufacturer and your health care provider. Contact a health care provider  if: You have ear pain. You develop a fever. You have pus or other fluid coming from your ear. You have hearing loss. You have ringing in your ears that does not go away. You feel like the room is spinning (vertigo). Your symptoms do not improve with treatment. Get help right away if: You have bleeding from the affected ear. You have severe ear pain. Summary Earwax can build up in the ear  and cause discomfort or hearing loss. The most common symptoms of this condition include reduced or muffled hearing, a feeling of fullness in the ear, or feeling that the ear is plugged. This condition may be diagnosed based on your symptoms, your medical history, and an ear exam. This condition may be treated by using ear drops to soften the earwax or by having the earwax removed by a health care provider. Do not put any objects, including cotton swabs, into your ear. You can clean the opening of your ear canal with a washcloth or facial tissue. This information is not intended to replace advice given to you by your health care provider. Make sure you discuss any questions you have with your health care provider. Document Revised: 08/22/2019 Document Reviewed: 08/22/2019 Elsevier Patient Education  [place].

## 2022-08-10 NOTE — Assessment & Plan Note (Signed)
Tolerating current dose Crestor.  Check updated labs, continue same with plan adjustment accordingly based on lab results

## 2022-08-10 NOTE — Assessment & Plan Note (Signed)
Tolerating higher dose of Trulicity, anticipate improved A1c by home readings.  Continue 4.5 mg Trulicity weekly, metformin 1000 mg twice daily.  Recheck 3 months.

## 2022-08-10 NOTE — Progress Notes (Signed)
Subjective:  Patient ID: Marcus Hart, male    DOB: [birth year]  Age: 57 y.o. MRN: [id number]  CC:  Chief Complaint  Patient presents with   Diabetes   Hyperlipidemia   Cerumen Impaction    Pt has wax in his Rt ear and would like to see about removal     HPI Marcus Hart presents for   Diabetes: Complicated by hyperglycemia, Treated with metformin 1000 mg twice daily, Trulicity 3 mg/week at his last visit.  He is on statin with Crestor 10 mg daily.  A1c improved in December but still slightly elevated.  Trulicity was increased to 4.5 mg/week. No new nausea/vomiting/abdominal pain or new side effects with this dose. Home readings Fasting: 110-114 Postprandial: 130-150 Symptomatic lows none.   Microalbumin: Normal ratio 09/01/2021 Optho, foot exam, pneumovax:  Ophthalmology exam - last year - trying to find provider in network.   Lab Results  Component Value Date   HGBA1C 7.7 (H) 04/30/2022   HGBA1C 8.9 (A) 01/22/2022   HGBA1C 7.1 (A) 10/15/2021   Lab Results  Component Value Date   MICROALBUR <0.7 09/01/2021   LDLCALC 114 (H) 01/22/2022   CREATININE 0.86 01/22/2022    Hyperlipidemia: Crestor qd, no new myalgias.  Lab Results  Component Value Date   CHOL 195 01/22/2022   HDL 68.30 01/22/2022   LDLCALC 114 (H) 01/22/2022   TRIG 64.0 01/22/2022   CHOLHDL 3 01/22/2022   Lab Results  Component Value Date   ALT 22 01/22/2022   AST 20 01/22/2022   ALKPHOS 49 01/22/2022   BILITOT 0.6 01/22/2022    R ear cerumen impaction: Past month - off and on feels blocked. Blocked today. Prior impaction - feels similar.  No pain,  d/c or bleeding.  Tx: otc wax remover - temporary relief.   History Patient Active Problem List   Diagnosis Date Noted   Type 2 diabetes mellitus with hyperglycemia, without long-term current use of insulin ([place]) 02/13/2013   Hyperlipidemia 02/13/2013    Past Medical History:  Diagnosis Date   Diabetes mellitus ([place])    Diabetes mellitus  without complication ([place])    Phreesia 11/10/2019   Hyperlipidemia       Review of Systems  Constitutional:  Negative for fatigue and unexpected weight change.  HENT:  Positive for hearing loss (blocked R ear.).   Eyes:  Negative for visual disturbance.  Respiratory:  Negative for cough, chest tightness and shortness of breath.   Cardiovascular:  Negative for chest pain, palpitations and leg swelling.  Gastrointestinal:  Negative for abdominal pain and blood in stool.  Neurological:  Negative for dizziness, light-headedness and headaches.     Objective:   Vitals:   08/10/22 0809  BP: 122/66  Pulse: 65  Temp: 97.9 F (36.6 C)  TempSrc: Temporal  SpO2: 100%  Weight: 176 lb 9.6 oz (80.1 kg)  Height: 5\' 9"  (1.753 m)     Physical Exam Vitals reviewed.  Constitutional:      Appearance: He is well-developed.  HENT:     Head: Normocephalic and atraumatic.     Right Ear: External ear normal. There is impacted cerumen.     Left Ear: Tympanic membrane, ear canal and external ear normal.     Ears:     Comments: Minimal nonobstructive cerumen at the base of the canal on the left, TM pearly gray.  Right canal obstructed with dark cerumen.  No canal erythema or edema. Neck:     Vascular: No carotid  bruit or JVD.  Cardiovascular:     Rate and Rhythm: Normal rate and regular rhythm.     Heart sounds: Normal heart sounds. No murmur heard. Pulmonary:     Effort: Pulmonary effort is normal.     Breath sounds: Normal breath sounds. No rales.  Musculoskeletal:     Right lower leg: No edema.     Left lower leg: No edema.  Skin:    General: Skin is warm and dry.  Neurological:     Mental Status: He is alert and oriented to person, place, and time.  Psychiatric:        Mood and Affect: Mood normal.    After risk/benefits/alternatives, consent obtained for ear lavage on the right for wax removal, or by assistant.  No complications, hearing improved afterwards without any sign of  TM rupture or canal injury.   Assessment & Plan:  Marcus Hart is a 57 y.o. male . Type 2 diabetes mellitus with hyperglycemia, without long-term current use of insulin ([place]) Assessment & Plan: Tolerating higher dose of Trulicity, anticipate improved A1c by home readings.  Continue 4.5 mg Trulicity weekly, metformin 1000 mg twice daily.  Recheck 3 months.  Orders: -     Hemoglobin A1c -     Comprehensive metabolic panel  Hyperlipidemia, unspecified hyperlipidemia type Assessment & Plan: Tolerating current dose Crestor.  Check updated labs, continue same with plan adjustment accordingly based on lab results  Orders: -     Comprehensive metabolic panel -     Lipid panel  Impacted cerumen of right ear -     Ear wax removal  Cerumen impaction with lavage as above.,  Resolved, preventative techniques discussed and handout.  RTC precautions.  Patient Instructions  Home blood sugars sound much better.  No change in meds for now.  I will let you know if there are concerns on labs, recheck in 3 months.  See information below regarding cerumen impaction or excess wax.  You can try over-the-counter Debrox every few months to see if that may lessen the chance that it becomes impacted again.  Take care!  Return to the clinic or go to the nearest emergency room if any of your symptoms worsen or new symptoms occur.   Earwax Buildup, Adult The ears produce a substance called earwax that helps keep bacteria out of the ear and protects the skin in the ear canal. Occasionally, earwax can build up in the ear and cause discomfort or hearing loss. What are the causes? This condition is caused by a buildup of earwax. Ear canals are self-cleaning. Ear wax is made in the outer part of the ear canal and generally falls out in small amounts over time. When the self-cleaning mechanism is not working, earwax builds up and can cause decreased hearing and discomfort. Attempting to clean ears with cotton  swabs can push the earwax deep into the ear canal and cause decreased hearing and pain. What increases the risk? This condition is more likely to develop in people who: Clean their ears often with cotton swabs. Pick at their ears. Use earplugs or in-ear headphones often, or wear hearing aids. The following factors may also make you more likely to develop this condition: Being male. Being of older age. Naturally producing more earwax. Having narrow ear canals. Having earwax that is overly thick or sticky. Having excess hair in the ear canal. Having eczema. Being dehydrated. What are the signs or symptoms? Symptoms of this condition include: Reduced or muffled  hearing. A feeling of fullness in the ear or feeling that the ear is plugged. Fluid coming from the ear. Ear pain or an itchy ear. Ringing in the ear. Coughing. Balance problems. An obvious piece of earwax that can be seen inside the ear canal. How is this diagnosed? This condition may be diagnosed based on: Your symptoms. Your medical history. An ear exam. During the exam, your health care provider will look into your ear with an instrument called an otoscope. You may have tests, including a hearing test. How is this treated? This condition may be treated by: Using ear drops to soften the earwax. Having the earwax removed by a health care provider. The health care provider may: Flush the ear with water. Use an instrument that has a loop on the end (curette). Use a suction device. Having surgery to remove the wax buildup. This may be done in severe cases. Follow these instructions at home:  Take over-the-counter and prescription medicines only as told by your health care provider. Do not put any objects, including cotton swabs, into your ear. You can clean the opening of your ear canal with a washcloth or facial tissue. Follow instructions from your health care provider about cleaning your ears. Do not overclean your  ears. Drink enough fluid to keep your urine pale yellow. This will help to thin the earwax. Keep all follow-up visits as told. If earwax builds up in your ears often or if you use hearing aids, consider seeing your health care provider for routine, preventive ear cleanings. Ask your health care provider how often you should schedule your cleanings. If you have hearing aids, clean them according to instructions from the manufacturer and your health care provider. Contact a health care provider if: You have ear pain. You develop a fever. You have pus or other fluid coming from your ear. You have hearing loss. You have ringing in your ears that does not go away. You feel like the room is spinning (vertigo). Your symptoms do not improve with treatment. Get help right away if: You have bleeding from the affected ear. You have severe ear pain. Summary Earwax can build up in the ear and cause discomfort or hearing loss. The most common symptoms of this condition include reduced or muffled hearing, a feeling of fullness in the ear, or feeling that the ear is plugged. This condition may be diagnosed based on your symptoms, your medical history, and an ear exam. This condition may be treated by using ear drops to soften the earwax or by having the earwax removed by a health care provider. Do not put any objects, including cotton swabs, into your ear. You can clean the opening of your ear canal with a washcloth or facial tissue. This information is not intended to replace advice given to you by your health care provider. Make sure you discuss any questions you have with your health care provider. Document Revised: 08/22/2019 Document Reviewed: 08/22/2019 Elsevier Patient Education  [place],   [name], MD [facility] 08/10/22 9:09 AM

## 2022-08-12 ENCOUNTER — Telehealth: Payer: Self-pay | Admitting: Family Medicine

## 2022-08-12 DIAGNOSIS — E785 Hyperlipidemia, unspecified: Secondary | ICD-10-CM

## 2022-08-12 NOTE — Telephone Encounter (Signed)
Paperwork received, authorization for rosuvastatin was denied.  Appears they are requiring use of atorvastatin 40 mg to 80 mg daily prior to rosuvastatin.  I am fine with trying atorvastatin in place of rosuvastatin if that is okay with patient.  I do not see where he has had any issues with that previously.  Let me know if that is okay and I can send in atorvastatin 20mg  (similar to 10mg  crestor).

## 2022-08-13 ENCOUNTER — Encounter: Payer: Self-pay | Admitting: Family Medicine

## 2022-08-13 ENCOUNTER — Other Ambulatory Visit: Payer: Self-pay

## 2022-08-13 DIAGNOSIS — E1165 Type 2 diabetes mellitus with hyperglycemia: Secondary | ICD-10-CM

## 2022-08-13 MED ORDER — TRULICITY 4.5 MG/0.5ML ~~LOC~~ SOAJ: 4.5000 mg | SUBCUTANEOUS | 1 refills | Status: DC

## 2022-08-13 MED ORDER — ATORVASTATIN CALCIUM 20 MG PO TABS: 20.0000 mg | ORAL_TABLET | Freq: Every day | ORAL | 1 refills | Status: DC

## 2022-08-13 NOTE — Telephone Encounter (Signed)
Spoke to the pt and I explained the change that is being requested . Pt is agreeable to try the Atorvastatin

## 2022-08-13 NOTE — Telephone Encounter (Signed)
Noted. Stop crestor, lipitor 20mg  sent to pharmacy.

## 2022-08-17 NOTE — Telephone Encounter (Signed)
Patient called states Trulicity is still waiting for Pre Auth from Dr's office.

## 2022-08-27 ENCOUNTER — Telehealth: Payer: Self-pay | Admitting: Family Medicine

## 2022-08-27 NOTE — Telephone Encounter (Signed)
Encourage patient to contact the pharmacy for refills or they can request refills through [facility]   WHAT PHARMACY WOULD THEY LIKE THIS SENT TO:  [address], [place], [place] [address]  MEDICATION NAME & DOSE: Dulaglutide (TRULICITY) 4.5 MG/0.5ML SOPN   NOTES/COMMENTS FROM PATIENT: Pt states insurance denied medication is there a replacement?- Advise      Front office please notify patient: It takes 48-72 hours to process rx refill requests Ask patient to call pharmacy to ensure rx is ready before heading there.

## 2022-08-28 NOTE — Telephone Encounter (Signed)
Pt insurance is not covering Trulicity . Can we switch the medication

## 2022-08-31 NOTE — Telephone Encounter (Signed)
I can change to [facility] or Ozempic. Please check with pharmacy to see if either of these will be covered in place of trulicity. Thanks.

## 2022-09-01 ENCOUNTER — Other Ambulatory Visit: Payer: Self-pay

## 2022-09-01 NOTE — Telephone Encounter (Signed)
Per pharmacist they state the [facility] nor Oszempic is covered without a PA . I spoke to the pt and advised him to call his insurance company and see what they will cover

## 2022-09-09 NOTE — Telephone Encounter (Signed)
Pt called states the insurance company is waiting old lab work to support the PA.

## 2022-09-14 ENCOUNTER — Encounter: Payer: Self-pay | Admitting: Family Medicine

## 2022-09-17 ENCOUNTER — Other Ambulatory Visit (HOSPITAL_COMMUNITY): Payer: Self-pay

## 2022-09-17 ENCOUNTER — Telehealth: Payer: Self-pay | Admitting: Pharmacy Technician

## 2022-09-17 NOTE — Telephone Encounter (Signed)
Pharmacy Patient Advocate Encounter   Received notification from Pt Advice requests that prior authorization for Trulicity 4.5mg  is required/requested.   PA denied on 08/09/22 by (ins) [facility]. via CoverMyMeds Key or (Medicaid) confirmation # [id number] - PA Case ID: [id number]   Appeal submitted today 09/17/22 and clinical notes/labs sent. Per denial letter, pt should meet criteria.

## 2022-09-17 NOTE — Telephone Encounter (Addendum)
error 

## 2022-09-17 NOTE — Telephone Encounter (Signed)
Appeal has been submitted. Sent office notes and labs that should suffice.   Doesn't look like the PA team sent the initial request, but the clinicals weren't initially sent. PA was denied 08/09/22 due to not meeting medical necessity. Based on the requirements he should be approved.  Below excerpt is from denial letter. Criteria for medical necessity were not met. Your records with [place] and the information submitted by your doctor do not show you meet the following criteria: (1) laboratory documentation supporting a condition of type 2 diabetes mellitus (high blood sugar levels) as evidenced by any of the following: a) fasting glucose (blood sugar) greater than 126 milligram per deciliter (mg/dL), b) two hour glucose tolerance test greater than 200 mg/dL, c) hemoglobin Z6X (a blood test that is an indicator of blood sugar levels over the past 3 months) of 6.5 percent or greater, or d) symptoms of hyperglycemia (high blood sugar levels) and a random plasma glucose greater than or equal to 200 mg/dL (2) documentation of one of the following: a) adequate trial and failure for at least 90 days or are unable to use metformin 1500 milligrams daily or b) have an A1c (a blood test that is an indicator of blood sugar levels over the past 3 months) of 7.5 percent or greater and require combination therapy

## 2022-09-22 ENCOUNTER — Other Ambulatory Visit: Payer: Self-pay

## 2022-09-22 ENCOUNTER — Telehealth: Payer: Self-pay | Admitting: Family Medicine

## 2022-09-22 DIAGNOSIS — E785 Hyperlipidemia, unspecified: Secondary | ICD-10-CM

## 2022-09-22 MED ORDER — ATORVASTATIN CALCIUM 20 MG PO TABS: 20.0000 mg | ORAL_TABLET | Freq: Every day | ORAL | 1 refills | Status: DC

## 2022-09-22 NOTE — Telephone Encounter (Signed)
Encourage patient to contact the pharmacy for refills or they can request refills through [facility]   WHAT PHARMACY WOULD THEY LIKE THIS SENT TO:   [id number] [place] [place] - [address]   MEDICATION NAME & DOSE:  metFORMIN (GLUCOPHAGE) 1000 MG tablet atorvastatin (LIPITOR) 20 MG tablet  NOTES/COMMENTS FROM PATIENT:      Front office please notify patient: It takes 48-72 hours to process rx refill requests Ask patient to call pharmacy to ensure rx is ready before heading there.

## 2022-09-22 NOTE — Telephone Encounter (Signed)
Metformin last refill 07/30/2021 180 tablets 1 refill Spoke with pt. [facility] . [facility] the rest of the rx.  Pt reports he was only given 30 days on Metformin I spoke with with [facility] and they dont take this pts insurance and he has not had this filled. This was denied 08/13/2022 and he has not been taking this.  [facility] Refilled 09/22/2022

## 2022-09-25 ENCOUNTER — Telehealth: Payer: Self-pay

## 2022-09-25 DIAGNOSIS — E1165 Type 2 diabetes mellitus with hyperglycemia: Secondary | ICD-10-CM

## 2022-09-25 MED ORDER — ONETOUCH VERIO VI STRP: ORAL_STRIP | 0 refills | Status: DC

## 2022-09-25 MED ORDER — METFORMIN HCL 1000 MG PO TABS: ORAL_TABLET | ORAL | 1 refills | Status: DC

## 2022-09-25 NOTE — Telephone Encounter (Signed)
Patient is requesting a 3 month supply due to travel, [facility],

## 2022-09-25 NOTE — Addendum Note (Signed)
Addended by: Meredith Staggers R on: 09/25/2022 01:03 PM   Modules accepted: Orders

## 2022-09-25 NOTE — Telephone Encounter (Signed)
Last office with me in March, was taking Crestor daily.  Lipitor removed from med list.

## 2022-10-02 NOTE — Telephone Encounter (Signed)
This letter confirms our approval for the requested drug. Your authorization number is: [facility] [phone] A LS As long as you remain covered by your prescription drug plan and there are no changes to your plan benefits, this request is approved for the following time period: 09/17/2022 - 09/17/2023

## 2022-10-05 ENCOUNTER — Other Ambulatory Visit (HOSPITAL_COMMUNITY): Payer: Self-pay

## 2022-11-13 ENCOUNTER — Ambulatory Visit: Payer: 59 | Admitting: Family Medicine

## 2022-12-16 ENCOUNTER — Encounter (INDEPENDENT_AMBULATORY_CARE_PROVIDER_SITE_OTHER): Payer: Self-pay

## 2022-12-25 ENCOUNTER — Ambulatory Visit: Payer: 59 | Admitting: Family Medicine

## 2023-01-01 ENCOUNTER — Ambulatory Visit: Payer: 59 | Admitting: Family Medicine

## 2023-01-02 ENCOUNTER — Other Ambulatory Visit: Payer: Self-pay | Admitting: Family Medicine

## 2023-01-02 DIAGNOSIS — E1165 Type 2 diabetes mellitus with hyperglycemia: Secondary | ICD-10-CM

## 2023-02-05 ENCOUNTER — Telehealth: Payer: Self-pay | Admitting: Family Medicine

## 2023-02-05 DIAGNOSIS — E1165 Type 2 diabetes mellitus with hyperglycemia: Secondary | ICD-10-CM

## 2023-02-05 MED ORDER — SEMAGLUTIDE (1 MG/DOSE) 4 MG/3ML ~~LOC~~ SOPN: 1.0000 mg | PEN_INJECTOR | SUBCUTANEOUS | 1 refills | Status: DC

## 2023-02-05 NOTE — Telephone Encounter (Signed)
Pt requesting alternative as he notes that he keeps having stock issues   Please advise or should pt come in so we may discuss

## 2023-02-05 NOTE — Telephone Encounter (Signed)
He is due for appointment.  No-show August 16, and I do not see another appointment scheduled at this time.  I will change to Ozempic 1 mg, although should be able to increase to 2 mg after 4 weeks if that is tolerated which would be a similar dose.  Please schedule appointment in the next 1 month.  If difficulty with obtaining Ozempic let me know and I can look at other options.

## 2023-02-05 NOTE — Telephone Encounter (Signed)
Patient called and states  pharmacy has had a back order for Dulaglutide (TRULICITY) 4.5 MG/0.5ML SOPN  for months. He would like to know what other options he has. Please advise patient.

## 2023-02-06 ENCOUNTER — Other Ambulatory Visit: Payer: Self-pay | Admitting: Family Medicine

## 2023-02-06 DIAGNOSIS — E785 Hyperlipidemia, unspecified: Secondary | ICD-10-CM

## 2023-02-08 NOTE — Telephone Encounter (Signed)
Called patient to inform him that Ozempic would be called in and to schedule one month follow up. Patient states ozempic does not work for him so I did express we would need to make an appt so he can discuss with provider other options. Appt was made for 10/4.

## 2023-02-17 ENCOUNTER — Other Ambulatory Visit (HOSPITAL_COMMUNITY): Payer: Self-pay

## 2023-02-17 ENCOUNTER — Telehealth: Payer: Self-pay | Admitting: Pharmacist

## 2023-02-17 NOTE — Telephone Encounter (Signed)
PA Questions have been submitted.

## 2023-02-17 NOTE — Telephone Encounter (Signed)
Pharmacy Patient Advocate Encounter   Received notification from CoverMyMeds that prior authorization for Ozempic (1 MG/DOSE) 4MG /3ML pen-injectors is required/requested.   Insurance verification completed.   The patient is insured through [facility] .   Per test claim: PA required; PA submitted to [facility] via CoverMyMeds Key/confirmation #/EOC [id number] Status is pending

## 2023-02-19 ENCOUNTER — Other Ambulatory Visit: Payer: Self-pay

## 2023-02-19 ENCOUNTER — Ambulatory Visit (INDEPENDENT_AMBULATORY_CARE_PROVIDER_SITE_OTHER): Payer: 59 | Admitting: Family Medicine

## 2023-02-19 ENCOUNTER — Encounter: Payer: Self-pay | Admitting: Family Medicine

## 2023-02-19 VITALS — BP 106/62 | HR 59 | Temp 97.9°F | Wt 172.8 lb

## 2023-02-19 DIAGNOSIS — E1165 Type 2 diabetes mellitus with hyperglycemia: Secondary | ICD-10-CM

## 2023-02-19 DIAGNOSIS — Z794 Long term (current) use of insulin: Secondary | ICD-10-CM | POA: Diagnosis not present

## 2023-02-19 DIAGNOSIS — E785 Hyperlipidemia, unspecified: Secondary | ICD-10-CM | POA: Diagnosis not present

## 2023-02-19 DIAGNOSIS — Z7985 Long-term (current) use of injectable non-insulin antidiabetic drugs: Secondary | ICD-10-CM | POA: Diagnosis not present

## 2023-02-19 DIAGNOSIS — Z23 Encounter for immunization: Secondary | ICD-10-CM | POA: Diagnosis not present

## 2023-02-19 LAB — COMPREHENSIVE METABOLIC PANEL
ALT: 14 U/L (ref 0–53)
AST: 15 U/L (ref 0–37)
Albumin: 4.4 g/dL (ref 3.5–5.2)
Alkaline Phosphatase: 56 U/L (ref 39–117)
BUN: 18 mg/dL (ref 6–23)
CO2: 29 meq/L (ref 19–32)
Calcium: 9.4 mg/dL (ref 8.4–10.5)
Chloride: 102 meq/L (ref 96–112)
Creatinine, Ser: 0.81 mg/dL (ref 0.40–1.50)
GFR: 98.69 mL/min (ref >60.00)
Glucose, Bld: 110 mg/dL — ABNORMAL HIGH (ref 70–99)
Potassium: 4.3 meq/L (ref 3.5–5.1)
Sodium: 138 meq/L (ref 135–145)
Total Bilirubin: 0.6 mg/dL (ref 0.2–1.2)
Total Protein: 6.8 g/dL (ref 6.0–8.3)

## 2023-02-19 LAB — LIPID PANEL
Cholesterol: 189 mg/dL (ref 0–200)
HDL: 66.5 mg/dL (ref >39.00)
LDL Cholesterol: 109 mg/dL — ABNORMAL HIGH (ref 0–99)
NonHDL: 122.63
Total CHOL/HDL Ratio: 3
Triglycerides: 67 mg/dL (ref 0.0–149.0)
VLDL: 13.4 mg/dL (ref 0.0–40.0)

## 2023-02-19 LAB — MICROALBUMIN / CREATININE URINE RATIO
Creatinine,U: 109.3 mg/dL
Microalb Creat Ratio: 0.6 mg/g (ref 0.0–30.0)
Microalb, Ur: <0.7 mg/dL (ref 0.0–1.9)

## 2023-02-19 LAB — HEMOGLOBIN A1C: Hgb A1c MFr Bld: 9.2 % — ABNORMAL HIGH (ref 4.6–6.5)

## 2023-02-19 MED ORDER — ROSUVASTATIN CALCIUM 10 MG PO TABS
10.0000 mg | ORAL_TABLET | Freq: Every day | ORAL | 1 refills | Status: DC
Start: 2023-02-19 — End: 2023-06-18

## 2023-02-19 MED ORDER — TRULICITY 0.75 MG/0.5ML ~~LOC~~ SOAJ
0.7500 mg | SUBCUTANEOUS | 1 refills | Status: DC
Start: 2023-02-19 — End: 2023-04-19
  Filled 2023-02-19: qty 2, 28d supply, fill #0
  Filled 2023-03-19: qty 2, 28d supply, fill #1

## 2023-02-19 MED ORDER — METFORMIN HCL 1000 MG PO TABS
ORAL_TABLET | ORAL | 1 refills | Status: DC
Start: 2023-02-19 — End: 2023-05-24

## 2023-02-19 NOTE — Progress Notes (Signed)
Subjective:  Patient ID: Marcus Hart, male    DOB: 1966/04/12  Age: 57 y.o. MRN: [id number]  CC:  Chief Complaint  Patient presents with   Diabetes    HPI Marcus Hart presents for   Diabetes: Complicated by hyperglycemia. Metformin 1000 mg twice daily, Trulicity 4.5mg /week.  Option of SGLT2 in March, but no change in meds since that time.  Unfortunately some availability issues and was switched to Ozempic recently -prior Auth in process.  However phone note noted from September 23, patient expressed that Ozempic did not work for him. He is on statin with Crestor 10 mg daily  Further history on timing of meds.  When seen in March he had been out of his Trulicity due to availability for most of the preceding 3 months, only used it for a few weeks in January.  Was taking metformin twice per day.  Has not been able to find Trulicity since his last visit.  He did go to [place] in May for 3 months and was seen by a clinician there and started on gliclazide 60 mg 30 minutes before breakfast, once daily and has had improved glycemic control with home readings in the 90s to 115's but no Symptomatic lows.  Appears to be a sulfonylurea.  He did note trying Ozempic in the past for a few weeks and had nausea and vomiting with that medication, would not like to try that again.  Would be amenable to restarting Trulicity if he can find it here locally.  Had been on 3 mg most recently and tolerated that dose.  Microalbumin: Due but normal on 09/01/2021. Optho, foot exam, pneumovax:  Optho exam planned in January. Foot exam below Flu vaccine -declines Tdap -agrees to have today.  Immunization History  Administered Date(s) Administered   Influenza,inj,Quad PF,6+ Mos 02/13/2013, 05/07/2014, 04/06/2015   Influenza-Unspecified 02/13/2013, 05/07/2014, 04/06/2015   Meningococcal B Recombinant 09/24/2017   Meningococcal Conjugate 09/08/2014, 09/24/2017   Meningococcal Mcv4o 09/08/2014    Pneumococcal Polysaccharide-23 06/18/2017   Pneumococcal-Unspecified 06/18/2017   Tdap 02/13/2013   Zoster Recombinant(Shingrix) 02/14/2021, 09/02/2021     Diabetic Foot Exam - Simple   Simple Foot Form Visual Inspection No deformities, no ulcerations, no other skin breakdown bilaterally: Yes Sensation Testing Intact to touch and monofilament testing bilaterally: Yes Pulse Check Posterior Tibialis and Dorsalis pulse intact bilaterally: Yes Comments      Lab Results  Component Value Date   HGBA1C 7.6 (H) 08/10/2022   HGBA1C 7.7 (H) 04/30/2022   HGBA1C 8.9 (A) 01/22/2022   Lab Results  Component Value Date   MICROALBUR <0.7 09/01/2021   LDLCALC 94 08/10/2022   CREATININE 0.80 08/10/2022    History Patient Active Problem List   Diagnosis Date Noted   Type 2 diabetes mellitus with hyperglycemia, without long-term current use of insulin (HCC) 02/13/2013   Hyperlipidemia 02/13/2013   Past Medical History:  Diagnosis Date   Diabetes mellitus (HCC)    Diabetes mellitus without complication (HCC)    Phreesia 11/10/2019   Hyperlipidemia    Past Surgical History:  Procedure Laterality Date   COLONOSCOPY     NO PAST SURGERIES     UPPER GASTROINTESTINAL ENDOSCOPY     No Known Allergies Prior to Admission medications   Medication Sig Start Date End Date Taking? Authorizing Provider  Aspirin-Calcium Carbonate (BAYER WOMENS) [phone] MG TABS Take by mouth. 04/17/19  Yes [provider]  glucose blood (ONETOUCH VERIO) test strip USE AS DIRECTED 01/04/23  Yes Neva Seat,  Asencion Partridge, MD  Insulin Pen Needle (PEN NEEDLES) 32G X 4 MM MISC Use with Victoza to inject once daily 06/30/21  Yes Shade Flood, MD  Lancets [facility] DELICA PLUS [place]) MISC USE AS DIRECTED TO TEST BLOOD GLUCOSE 11/03/21  Yes Shade Flood, MD  metFORMIN (GLUCOPHAGE) 1000 MG tablet Take 1 tab by mouth 2 time daily with a meal. 09/25/22  Yes Shade Flood, MD  rosuvastatin (CRESTOR) 10 MG  tablet Take by mouth. 04/24/19  Yes [provider]  Semaglutide, 1 MG/DOSE, 4 MG/3ML SOPN Inject 1 mg as directed once a week. Patient not taking: Reported on 02/19/2023 02/05/23   Shade Flood, MD   Social History   Socioeconomic History   Marital status: Married    Spouse name: [name]   Number of children: 4   Years of education: Not on file   Highest education level: Not on file  Occupational History   Occupation: Designer, television/film set  Tobacco Use   Smoking status: Never   Smokeless tobacco: Never  Vaping Use   Vaping status: Never Used  Substance and Sexual Activity   Alcohol use: Never    Alcohol/week: 0.0 standard drinks of alcohol   Drug use: No   Sexual activity: Yes  Other Topics Concern   Not on file  Social History Narrative   Married   Education: Automotive engineer   Exercise: No   Social Determinants of Corporate investment banker Strain: Not on file  Food Insecurity: Not on file  Transportation Needs: Not on file  Physical Activity: Not on file  Stress: Not on file  Social Connections: Not on file  Intimate Partner Violence: Not on file    Review of Systems  Constitutional:  Negative for fatigue and unexpected weight change.  Eyes:  Negative for visual disturbance.  Respiratory:  Negative for cough, chest tightness and shortness of breath.   Cardiovascular:  Negative for chest pain, palpitations and leg swelling.  Gastrointestinal:  Negative for abdominal pain and blood in stool.  Neurological:  Negative for dizziness, light-headedness and headaches.     Objective:   Vitals:   02/19/23 0914  BP: 106/62  Pulse: (!) 59  Temp: 97.9 F (36.6 C)  TempSrc: Temporal  SpO2: 100%  Weight: 172 lb 12.8 oz (78.4 kg)     Physical Exam Vitals reviewed.  Constitutional:      Appearance: He is well-developed.  HENT:     Head: Normocephalic and atraumatic.  Neck:     Vascular: No carotid bruit or JVD.  Cardiovascular:     Rate and Rhythm: Normal rate and  regular rhythm.     Heart sounds: Normal heart sounds. No murmur heard. Pulmonary:     Effort: Pulmonary effort is normal.     Breath sounds: Normal breath sounds. No rales.  Musculoskeletal:     Right lower leg: No edema.     Left lower leg: No edema.  Skin:    General: Skin is warm and dry.  Neurological:     Mental Status: He is alert and oriented to person, place, and time.  Psychiatric:        Mood and Affect: Mood normal.        Assessment & Plan:  Marcus Hart is a 57 y.o. male . Type 2 diabetes mellitus with hyperglycemia, without long-term current use of insulin (HCC) - Plan: Hemoglobin A1c, Lipid panel, CMP, Microalbumin / creatinine urine ratio, Tdap vaccine greater than or equal to 7yo  IM, metFORMIN (GLUCOPHAGE) 1000 MG tablet, Dulaglutide (TRULICITY) 0.75 MG/0.5ML SOPN  Hyperlipidemia, unspecified hyperlipidemia type - Plan: Lipid panel, rosuvastatin (CRESTOR) 10 MG tablet  Need for diphtheria-tetanus-pertussis (Tdap) vaccine - Plan: Tdap vaccine greater than or equal to 7yo IM   Based on slight elevated A1c but only few doses over the previous 3 months with Trulicity, I do not think he needs to be on a high dose at this point.  May have better availability with checking different pharmacies, printed prescription for 0.75 mg with home monitoring and update on readings in the next month.  Continue same dose metformin, stop sulfonyurea at this point.  Tolerating statin, continue same, check updated labs.  Tdap updated.  Meds ordered this encounter  Medications   metFORMIN (GLUCOPHAGE) 1000 MG tablet    Sig: Take 1 tab by mouth 2 time daily with a meal.    Dispense:  180 tablet    Refill:  1   rosuvastatin (CRESTOR) 10 MG tablet    Sig: Take 1 tablet (10 mg total) by mouth daily.    Dispense:  90 tablet    Refill:  1   Dulaglutide (TRULICITY) 0.75 MG/0.5ML SOPN    Sig: Inject 0.75 mg into the skin once a week.    Dispense:  2 mL    Refill:  1   Patient  Instructions  Thanks for coming in today.  Based on your last A1c and infrequent use of Trulicity I think we should restart that at the lower dose and see if that is available at a different pharmacy.  I have printed that prescription to shop around.  Other medications were sent to your mail order.  No other changes for now.  I will check lab work today.  Stop the medication gliclazide for at least a few days before starting the Trulicity injection.  Let me know your blood sugar readings in the next 1 month and we can decide on increased doses of Trulicity if needed.  64-month in office visit.  Take care!    Signed,   Meredith Staggers, MD  Primary Care, [facility] 02/19/23 9:56 AM

## 2023-02-19 NOTE — Patient Instructions (Signed)
Thanks for coming in today.  Based on your last A1c and infrequent use of Trulicity I think we should restart that at the lower dose and see if that is available at a different pharmacy.  I have printed that prescription to shop around.  Other medications were sent to your mail order.  No other changes for now.  I will check lab work today.  Stop the medication gliclazide for at least a few days before starting the Trulicity injection.  Let me know your blood sugar readings in the next 1 month and we can decide on increased doses of Trulicity if needed.  52-month in office visit.  Take care!

## 2023-02-22 ENCOUNTER — Other Ambulatory Visit: Payer: Self-pay | Admitting: Family Medicine

## 2023-02-22 DIAGNOSIS — E785 Hyperlipidemia, unspecified: Secondary | ICD-10-CM

## 2023-02-23 NOTE — Telephone Encounter (Signed)
Pharmacy Patient Advocate Encounter  Received notification from [facility] that Prior Authorization for Ozempic (1 MG/DOSE) 4MG /3ML pen-injectors has been APPROVED from 02-18-2023 to 02-18-2024   PA #/Case ID/Reference #: Ignacia Palma

## 2023-02-23 NOTE — Telephone Encounter (Signed)
Called and LM to inform the patient

## 2023-03-03 ENCOUNTER — Telehealth: Payer: Self-pay

## 2023-03-03 NOTE — Telephone Encounter (Signed)
-----   Message from Shade Flood sent at 03/02/2023  4:56 PM EDT ----- Results sent by MyChart, but appears patient has not yet reviewed those results.  Please call and make sure they have either seen note or discuss result note.  Thanks.

## 2023-03-19 ENCOUNTER — Other Ambulatory Visit: Payer: Self-pay

## 2023-03-19 ENCOUNTER — Telehealth: Payer: Self-pay

## 2023-03-19 NOTE — Telephone Encounter (Signed)
Patient called requesting a refill and on review the refill is available at the pharmacy.

## 2023-04-03 ENCOUNTER — Other Ambulatory Visit: Payer: Self-pay | Admitting: Family Medicine

## 2023-04-03 DIAGNOSIS — E785 Hyperlipidemia, unspecified: Secondary | ICD-10-CM

## 2023-04-05 NOTE — Telephone Encounter (Signed)
Please clarify med list.  He was on Crestor at his last visit with me in October.  Did something change?  Crestor was just recently refilled.

## 2023-04-05 NOTE — Telephone Encounter (Signed)
Please advise 

## 2023-04-19 ENCOUNTER — Telehealth: Payer: Self-pay | Admitting: Family Medicine

## 2023-04-19 DIAGNOSIS — E1165 Type 2 diabetes mellitus with hyperglycemia: Secondary | ICD-10-CM

## 2023-04-19 MED ORDER — TRULICITY 0.75 MG/0.5ML ~~LOC~~ SOAJ: 0.7500 mg | SUBCUTANEOUS | 1 refills | Status: DC

## 2023-04-19 NOTE — Telephone Encounter (Signed)
Encourage patient to contact the pharmacy for refills or they can request refills through [facility]  (Please schedule appointment if patient has not been seen in over a year)    WHAT PHARMACY WOULD THEY LIKE THIS SENT TO: [place] [facility]   MEDICATION NAME & DOSE: Dulaglutide (TRULICITY) 0.75 MG/0.5ML SOAJ   NOTES/COMMENTS FROM PATIENT:      Front office please notify patient: It takes 48-72 hours to process rx refill requests Ask patient to call pharmacy to ensure rx is ready before heading there.

## 2023-04-19 NOTE — Telephone Encounter (Signed)
Refill sent.

## 2023-04-20 ENCOUNTER — Other Ambulatory Visit: Payer: Self-pay | Admitting: Family Medicine

## 2023-04-20 ENCOUNTER — Other Ambulatory Visit: Payer: Self-pay

## 2023-04-20 DIAGNOSIS — E1165 Type 2 diabetes mellitus with hyperglycemia: Secondary | ICD-10-CM

## 2023-04-21 ENCOUNTER — Other Ambulatory Visit (HOSPITAL_COMMUNITY): Payer: Self-pay

## 2023-04-21 ENCOUNTER — Telehealth: Payer: Self-pay | Admitting: Family Medicine

## 2023-04-21 DIAGNOSIS — E1165 Type 2 diabetes mellitus with hyperglycemia: Secondary | ICD-10-CM

## 2023-04-21 MED ORDER — TRULICITY 0.75 MG/0.5ML ~~LOC~~ SOAJ
0.7500 mg | SUBCUTANEOUS | 1 refills | Status: DC
  Filled 2023-04-21 – 2023-04-23 (×2): qty 2, 28d supply, fill #0

## 2023-04-21 NOTE — Telephone Encounter (Signed)
Encourage patient to contact the pharmacy for refills or they can request refills through [facility]  WHAT PHARMACY WOULD THEY LIKE THIS SENT TO:  [place] [facility]   MEDICATION NAME & DOSE: Trulicity Dulaglutide (TRULICITY) 0.75 MG/0.5ML SOAJ  NOTES/COMMENTS FROM PATIENT:      Front office please notify patient: It takes 48-72 hours to process rx refill requests Ask patient to call pharmacy to ensure rx is ready before heading there.

## 2023-04-21 NOTE — Addendum Note (Signed)
Addended by: Eldred Manges on: 04/21/2023 03:45 PM   Modules accepted: Orders

## 2023-04-23 ENCOUNTER — Other Ambulatory Visit (HOSPITAL_COMMUNITY): Payer: Self-pay

## 2023-04-23 ENCOUNTER — Other Ambulatory Visit: Payer: Self-pay

## 2023-04-28 ENCOUNTER — Other Ambulatory Visit: Payer: Self-pay

## 2023-05-24 ENCOUNTER — Other Ambulatory Visit: Payer: Self-pay | Admitting: Family Medicine

## 2023-05-24 ENCOUNTER — Other Ambulatory Visit: Payer: Self-pay

## 2023-05-24 DIAGNOSIS — E1165 Type 2 diabetes mellitus with hyperglycemia: Secondary | ICD-10-CM

## 2023-05-24 MED ORDER — METFORMIN HCL 1000 MG PO TABS
1000.0000 mg | ORAL_TABLET | Freq: Two times a day (BID) | ORAL | 1 refills | Status: DC
  Filled 2023-05-24: qty 180, 90d supply, fill #0
  Filled 2023-09-10: qty 180, 90d supply, fill #1

## 2023-05-24 MED ORDER — TRULICITY 0.75 MG/0.5ML ~~LOC~~ SOAJ
0.7500 mg | SUBCUTANEOUS | 1 refills | Status: DC
  Filled 2023-05-24: qty 2, 28d supply, fill #0
  Filled 2023-06-18: qty 2, 28d supply, fill #1

## 2023-05-24 NOTE — Telephone Encounter (Signed)
 Requested Prescriptions   Pending Prescriptions Disp Refills   metFORMIN  (GLUCOPHAGE ) 1000 MG tablet 180 tablet 1    Sig: Take 1 tab by mouth 2 time daily with a meal.   Dulaglutide  (TRULICITY ) 0.75 MG/0.5ML SOAJ 2 mL 1    Sig: Inject 0.75 mg into the skin once a week.     Date of patient request: 05/24/2023 Last office visit: 02/19/2023 Upcoming visit: 05/28/2023 Date of last refill: 04/21/2023 Last refill amount: 180 tab, 2 mL

## 2023-05-24 NOTE — Telephone Encounter (Signed)
 Copied from CRM [id number]. Topic: Clinical - Medication Refill >> May 24, 2023  2:37 PM Adaysia C wrote: Most Recent Primary Care Visit:  Provider: LEVORA PURCHASE R  Department: [facility]  Visit Type: OFFICE VISIT  Date: 02/19/2023  Medication: metFORMIN  (GLUCOPHAGE ) 1000 MG tablet Dulaglutide  (TRULICITY ) 0.75 MG/0.5ML SOAJ  Has the patient contacted their pharmacy? No, patient contacted the provider first to initiate RX refills (Agent: If no, request that the patient contact the pharmacy for the refill. If patient does not wish to contact the pharmacy document the reason why and proceed with request.) (Agent: If yes, when and what did the pharmacy advise?)  Is this the correct pharmacy for this prescription? Yes If no, delete pharmacy and type the correct one.  This is the patient's preferred pharmacy:   [facility] [address], [place] [address] Phone: [phone]  Fax: [phone] DEA #: [id number]    Has the prescription been filled recently? Yes  Is the patient out of the medication? Yes  Has the patient been seen for an appointment in the last year OR does the patient have an upcoming appointment?   Can we respond through MyChart?   Agent: Please be advised that Rx refills may take up to 3 business days. We ask that you follow-up with your pharmacy.

## 2023-05-26 ENCOUNTER — Other Ambulatory Visit: Payer: Self-pay

## 2023-05-26 ENCOUNTER — Other Ambulatory Visit (HOSPITAL_COMMUNITY): Payer: Self-pay

## 2023-05-28 ENCOUNTER — Ambulatory Visit: Payer: 59 | Admitting: Family Medicine

## 2023-05-28 ENCOUNTER — Other Ambulatory Visit: Payer: Self-pay

## 2023-05-28 ENCOUNTER — Telehealth: Payer: Self-pay | Admitting: Family Medicine

## 2023-05-28 NOTE — Telephone Encounter (Signed)
 Patient has been made aware these were sent in on the 6th of this month and should be ready for pick up already ! Pt voiced understanding and appreciation

## 2023-05-28 NOTE — Telephone Encounter (Signed)
 Encourage patient to contact the pharmacy for refills or they can request refills through [facility]  (Please schedule appointment if patient has not been seen in over a year)    WHAT PHARMACY WOULD THEY LIKE THIS SENT TO: [place] [facility]   MEDICATION NAME & DOSE: Dulaglutide  (TRULICITY ) 0.75 MG/0.5ML SOAJ  metFORMIN  (GLUCOPHAGE ) 1000 MG tablet   NOTES/COMMENTS FROM PATIENT: Pt showed up past the 10 min late policy and was rescheduled. He has requested to get these medications refilled in the meantime.      Front office please notify patient: It takes 48-72 hours to process rx refill requests Ask patient to call pharmacy to ensure rx is ready before heading there.

## 2023-05-30 ENCOUNTER — Other Ambulatory Visit: Payer: Self-pay | Admitting: Family Medicine

## 2023-05-30 DIAGNOSIS — E1165 Type 2 diabetes mellitus with hyperglycemia: Secondary | ICD-10-CM

## 2023-05-31 ENCOUNTER — Other Ambulatory Visit: Payer: Self-pay | Admitting: Family Medicine

## 2023-05-31 DIAGNOSIS — E1165 Type 2 diabetes mellitus with hyperglycemia: Secondary | ICD-10-CM

## 2023-06-08 ENCOUNTER — Other Ambulatory Visit: Payer: Self-pay | Admitting: Family Medicine

## 2023-06-08 DIAGNOSIS — E1165 Type 2 diabetes mellitus with hyperglycemia: Secondary | ICD-10-CM

## 2023-06-18 ENCOUNTER — Ambulatory Visit: Payer: No Typology Code available for payment source | Admitting: Family Medicine

## 2023-06-18 ENCOUNTER — Other Ambulatory Visit: Payer: Self-pay

## 2023-06-18 VITALS — BP 124/66 | HR 58 | Temp 98.3°F | Ht 69.0 in | Wt 176.4 lb

## 2023-06-18 DIAGNOSIS — E1165 Type 2 diabetes mellitus with hyperglycemia: Secondary | ICD-10-CM

## 2023-06-18 DIAGNOSIS — E785 Hyperlipidemia, unspecified: Secondary | ICD-10-CM | POA: Diagnosis not present

## 2023-06-18 DIAGNOSIS — Z7984 Long term (current) use of oral hypoglycemic drugs: Secondary | ICD-10-CM

## 2023-06-18 DIAGNOSIS — E010 Iodine-deficiency related diffuse (endemic) goiter: Secondary | ICD-10-CM | POA: Diagnosis not present

## 2023-06-18 LAB — COMPREHENSIVE METABOLIC PANEL
ALT: 17 U/L (ref 0–53)
AST: 18 U/L (ref 0–37)
Albumin: 4.3 g/dL (ref 3.5–5.2)
Alkaline Phosphatase: 52 U/L (ref 39–117)
BUN: 15 mg/dL (ref 6–23)
CO2: 28 meq/L (ref 19–32)
Calcium: 8.9 mg/dL (ref 8.4–10.5)
Chloride: 102 meq/L (ref 96–112)
Creatinine, Ser: 0.79 mg/dL (ref 0.40–1.50)
GFR: 99.21 mL/min (ref >60.00)
Glucose, Bld: 121 mg/dL — ABNORMAL HIGH (ref 70–99)
Potassium: 4.3 meq/L (ref 3.5–5.1)
Sodium: 137 meq/L (ref 135–145)
Total Bilirubin: 0.6 mg/dL (ref 0.2–1.2)
Total Protein: 6.5 g/dL (ref 6.0–8.3)

## 2023-06-18 LAB — HEMOGLOBIN A1C: Hgb A1c MFr Bld: 7.8 % — ABNORMAL HIGH (ref 4.6–6.5)

## 2023-06-18 LAB — TSH: TSH: 0.75 u[IU]/mL (ref 0.35–5.50)

## 2023-06-18 MED ORDER — ROSUVASTATIN CALCIUM 10 MG PO TABS
10.0000 mg | ORAL_TABLET | Freq: Every day | ORAL | 1 refills | Status: DC
  Filled 2023-06-18: qty 90, 90d supply, fill #0
  Filled 2023-09-10: qty 90, 90d supply, fill #1

## 2023-06-18 NOTE — Progress Notes (Signed)
Subjective:  Patient ID: Marcus Hart, male    DOB: 1965/12/20  Age: 58 y.o. MRN: [id number]  CC:  Chief Complaint  Patient presents with   Medical Management of Chronic Issues    Pt is well no concerns,     HPI Marcus Hart presents for   Diabetes: Complicated by hyperglycemia.  Treated with metformin 1000 mg twice daily, Trulicity 4.5 mg/week, difficulty with availability of medications previously.  At his October visit, had not been able to obtain Trulicity.  Had gone to [place] in May of last year for a few months, started on gliclazide with readings in the 90-115..  This was a sulfonylurea.  Also noted October that he had some nausea/vomiting with Ozempic trial for a few weeks previously.  He was amenable to restarting Trulicity if available.  A1c uncontrolled in October.  Initially restarted Trulicity at 0.75 mg weekly, continue metformin 1000 mg twice daily.  Stop sulfonylurea at that time. He is on statin.  Microalbumin: Normal ratio 02/19/2023 Optho, foot exam, pneumovax: Due for foot exam, ophthalmology exam, pneumonia vaccine, flu vaccine.  Since last visit,  started trulicity - taking weekly.  No n/v/abd pain/neck swelling Home readings fasting: 105-115 Postprandial: 180-200 No sx lows. Lowest - 90's.   Lab Results  Component Value Date   HGBA1C 9.2 (H) 02/19/2023   HGBA1C 7.6 (H) 08/10/2022   HGBA1C 7.7 (H) 04/30/2022   Lab Results  Component Value Date   MICROALBUR <0.7 02/19/2023   LDLCALC 109 (H) 02/19/2023   CREATININE 0.81 02/19/2023   Hyperlipidemia: Crestor 10 mg daily, LDL 109 in October.  Off meds at that time. Still not taking. Not covered by insurance last year? Waiting for authorization.  Now on new insurance, new pharmacy.    Lab Results  Component Value Date   CHOL 189 02/19/2023   HDL 66.50 02/19/2023   LDLCALC 109 (H) 02/19/2023   TRIG 67.0 02/19/2023   CHOLHDL 3 02/19/2023   Lab Results  Component Value Date   ALT 14  02/19/2023   AST 15 02/19/2023   ALKPHOS 56 02/19/2023   BILITOT 0.6 02/19/2023   Diabetic Foot Exam - Simple   Simple Foot Form Visual Inspection No deformities, no ulcerations, no other skin breakdown bilaterally: Yes Sensation Testing Intact to touch and monofilament testing bilaterally: Yes Pulse Check Posterior Tibialis and Dorsalis pulse intact bilaterally: Yes Comments       History Patient Active Problem List   Diagnosis Date Noted   Type 2 diabetes mellitus with hyperglycemia, without long-term current use of insulin (HCC) 02/13/2013   Hyperlipidemia 02/13/2013   Past Medical History:  Diagnosis Date   Diabetes mellitus (HCC)    Diabetes mellitus without complication (HCC)    Phreesia 11/10/2019   Hyperlipidemia    Past Surgical History:  Procedure Laterality Date   COLONOSCOPY     NO PAST SURGERIES     UPPER GASTROINTESTINAL ENDOSCOPY     No Known Allergies Prior to Admission medications   Medication Sig Start Date End Date Taking? Authorizing Provider  Aspirin-Calcium Carbonate (BAYER WOMENS) [phone] MG TABS Take by mouth. 04/17/19   [provider]  Dulaglutide (TRULICITY) 0.75 MG/0.5ML SOAJ Inject 0.75 mg into the skin once a week. 05/24/23   Shade Flood, MD  Insulin Pen Needle (PEN NEEDLES) 32G X 4 MM MISC Use with Victoza to inject once daily 06/30/21   Shade Flood, MD  Lancets [facility] DELICA PLUS LANCET33G) MISC USE AS DIRECTED TO  TEST BLOOD GLUCOSE 11/03/21   Shade Flood, MD  metFORMIN (GLUCOPHAGE) 1000 MG tablet Take 1 tablet (1,000 mg total) by mouth 2 (two) times daily with a meal. 05/24/23   Shade Flood, MD  [facility] VERIO test strip USE AS DIRECTED 05/31/23   Shade Flood, MD  rosuvastatin (CRESTOR) 10 MG tablet Take 1 tablet (10 mg total) by mouth daily. 02/19/23   Shade Flood, MD   Social History   Socioeconomic History   Marital status: Married    Spouse name: [name]   Number of children: 4   Years of  education: Not on file   Highest education level: Bachelor's degree (e.g., BA, AB, BS)  Occupational History   Occupation: Designer, television/film set  Tobacco Use   Smoking status: Never   Smokeless tobacco: Never  Vaping Use   Vaping status: Never Used  Substance and Sexual Activity   Alcohol use: Never    Alcohol/week: 0.0 standard drinks of alcohol   Drug use: No   Sexual activity: Yes  Other Topics Concern   Not on file  Social History Narrative   Married   Education: Automotive engineer   Exercise: No   Social Drivers of Corporate investment banker Strain: Medium Risk (06/18/2023)   Overall Financial Resource Strain (CARDIA)    Difficulty of Paying Living Expenses: Somewhat hard  Food Insecurity: Food Insecurity Present (06/18/2023)   Hunger Vital Sign    Worried About Running Out of Food in the Last Year: Sometimes true    Ran Out of Food in the Last Year: Never true  Transportation Needs: No Transportation Needs (06/18/2023)   PRAPARE - Administrator, Civil Service (Medical): No    Lack of Transportation (Non-Medical): No  Physical Activity: Insufficiently Active (06/18/2023)   Exercise Vital Sign    Days of Exercise per Week: 1 day    Minutes of Exercise per Session: 30 min  Stress: No Stress Concern Present (06/18/2023)   Marcus Hart of Occupational Health - Occupational Stress Questionnaire    Feeling of Stress : Not at all  Social Connections: Moderately Integrated (06/18/2023)   Social Connection and Isolation Panel [NHANES]    Frequency of Communication with Friends and Family: More than three times a week    Frequency of Social Gatherings with Friends and Family: Once a week    Attends Religious Services: More than 4 times per year    Active Member of [employer] or Organizations: No    Attends Engineer, structural: Not on file    Marital Status: Married  Catering manager Violence: Not on file    Review of Systems  Constitutional:  Negative for fatigue and  unexpected weight change.  Eyes:  Negative for visual disturbance.  Respiratory:  Negative for cough, chest tightness and shortness of breath.   Cardiovascular:  Negative for chest pain, palpitations and leg swelling.  Gastrointestinal:  Negative for abdominal pain and blood in stool.  Neurological:  Negative for dizziness, light-headedness and headaches.     Objective:   Vitals:   06/18/23 0947  BP: 124/66  Pulse: (!) 58  Temp: 98.3 F (36.8 C)  TempSrc: Temporal  SpO2: 99%  Weight: 176 lb 6.4 oz (80 kg)  Height: 5\' 9"  (1.753 m)     Physical Exam Vitals reviewed.  Constitutional:      Appearance: He is well-developed.  HENT:     Head: Normocephalic and atraumatic.  Neck:     Vascular: No  carotid bruit or JVD.     Comments: Firm, enlarged thyroid bilaterally.  Nontender. Cardiovascular:     Rate and Rhythm: Normal rate and regular rhythm.     Heart sounds: Normal heart sounds. No murmur heard. Pulmonary:     Effort: Pulmonary effort is normal.     Breath sounds: Normal breath sounds. No rales.  Musculoskeletal:     Right lower leg: No edema.     Left lower leg: No edema.  Skin:    General: Skin is warm and dry.  Neurological:     Mental Status: He is alert and oriented to person, place, and time.  Psychiatric:        Mood and Affect: Mood normal.        Assessment & Plan:  Macguire Perl is a 58 y.o. male . Type 2 diabetes mellitus with hyperglycemia, without long-term current use of insulin (HCC) - Plan: Comprehensive metabolic panel, Hemoglobin A1c  -Now on both Trulicity and metformin.  Check A1c and likely increase dose of Trulicity.  Tolerating current regimen.  8-month follow-up.  Hyperlipidemia, unspecified hyperlipidemia type - Plan: rosuvastatin (CRESTOR) 10 MG tablet  -Previous labs off statin.  New pharmacy, will reorder Crestor and recheck labs at next visit.  Advised to let me know if any difficulty obtaining medication.  Thyromegaly - Plan:  US THYROID, TSH  -On exam I am suspicious for thyromegaly, bilaterally.  Check TSH, ultrasound thyroid to determine next step.  Meds ordered this encounter  Medications   rosuvastatin (CRESTOR) 10 MG tablet    Sig: Take 1 tablet (10 mg total) by mouth daily.    Dispense:  90 tablet    Refill:  1   Patient Instructions  Thank you for coming in today.  Glad that the Trulicity has been working okay.  I have resent the cholesterol medication to your pharmacy.  Let me know if that is not covered.  We can recheck those levels at your follow-up visit in 3 months.  No change in metformin dosing for now.   I am suspicious for an enlarged thyroid gland, I will check an ultrasound as well as thyroid test today and based on his results can decide if other workup needed.  Will me know if there are questions and take care.    Signed,   Meredith Staggers, MD [facility] 06/18/23 10:15 AM

## 2023-06-18 NOTE — Patient Instructions (Signed)
Thank you for coming in today.  Glad that the Trulicity has been working okay.  I have resent the cholesterol medication to your pharmacy.  Let me know if that is not covered.  We can recheck those levels at your follow-up visit in 3 months.  No change in metformin dosing for now.   I am suspicious for an enlarged thyroid gland, I will check an ultrasound as well as thyroid test today and based on his results can decide if other workup needed.  Will me know if there are questions and take care.

## 2023-06-20 ENCOUNTER — Other Ambulatory Visit: Payer: Self-pay | Admitting: Family Medicine

## 2023-06-20 ENCOUNTER — Encounter: Payer: Self-pay | Admitting: Family Medicine

## 2023-06-20 DIAGNOSIS — E1165 Type 2 diabetes mellitus with hyperglycemia: Secondary | ICD-10-CM

## 2023-06-20 MED ORDER — TRULICITY 1.5 MG/0.5ML ~~LOC~~ SOAJ
1.5000 mg | SUBCUTANEOUS | 1 refills | Status: DC
  Filled 2023-06-20 – 2023-07-26 (×3): qty 2, 28d supply, fill #0
  Filled 2023-08-16: qty 2, 28d supply, fill #1
  Filled 2023-09-10: qty 2, 28d supply, fill #2

## 2023-06-20 NOTE — Progress Notes (Signed)
 See labs

## 2023-06-21 ENCOUNTER — Other Ambulatory Visit (HOSPITAL_COMMUNITY): Payer: Self-pay

## 2023-07-02 ENCOUNTER — Ambulatory Visit
Admission: RE | Admit: 2023-07-02 | Discharge: 2023-07-02 | Disposition: A | Discharge status: Home / Self Care | Payer: No Typology Code available for payment source | Source: Ambulatory Visit | Attending: Family Medicine | Admitting: Family Medicine

## 2023-07-02 DIAGNOSIS — E010 Iodine-deficiency related diffuse (endemic) goiter: Secondary | ICD-10-CM

## 2023-07-09 ENCOUNTER — Encounter: Payer: Self-pay | Admitting: Family Medicine

## 2023-07-26 ENCOUNTER — Other Ambulatory Visit: Payer: Self-pay

## 2023-08-16 ENCOUNTER — Other Ambulatory Visit: Payer: Self-pay

## 2023-09-10 ENCOUNTER — Other Ambulatory Visit: Payer: Self-pay

## 2023-09-24 ENCOUNTER — Encounter (HOSPITAL_COMMUNITY): Payer: Self-pay

## 2023-10-01 ENCOUNTER — Other Ambulatory Visit (HOSPITAL_COMMUNITY): Payer: Self-pay

## 2023-10-01 ENCOUNTER — Encounter: Payer: Self-pay | Admitting: Family Medicine

## 2023-10-01 ENCOUNTER — Ambulatory Visit (INDEPENDENT_AMBULATORY_CARE_PROVIDER_SITE_OTHER): Payer: No Typology Code available for payment source | Admitting: Family Medicine

## 2023-10-01 VITALS — BP 122/64 | HR 82 | Temp 97.8°F | Resp 17 | Ht 70.75 in | Wt 171.4 lb

## 2023-10-01 DIAGNOSIS — M545 Low back pain, unspecified: Secondary | ICD-10-CM

## 2023-10-01 DIAGNOSIS — Z7985 Long-term (current) use of injectable non-insulin antidiabetic drugs: Secondary | ICD-10-CM

## 2023-10-01 DIAGNOSIS — Z125 Encounter for screening for malignant neoplasm of prostate: Secondary | ICD-10-CM | POA: Diagnosis not present

## 2023-10-01 DIAGNOSIS — Z0000 Encounter for general adult medical examination without abnormal findings: Secondary | ICD-10-CM

## 2023-10-01 DIAGNOSIS — L816 Other disorders of diminished melanin formation: Secondary | ICD-10-CM

## 2023-10-01 DIAGNOSIS — E785 Hyperlipidemia, unspecified: Secondary | ICD-10-CM | POA: Diagnosis not present

## 2023-10-01 DIAGNOSIS — Z7984 Long term (current) use of oral hypoglycemic drugs: Secondary | ICD-10-CM

## 2023-10-01 DIAGNOSIS — E1165 Type 2 diabetes mellitus with hyperglycemia: Secondary | ICD-10-CM | POA: Diagnosis not present

## 2023-10-01 LAB — COMPREHENSIVE METABOLIC PANEL WITH GFR
ALT: 14 U/L (ref 0–53)
AST: 15 U/L (ref 0–37)
Albumin: 4.4 g/dL (ref 3.5–5.2)
Alkaline Phosphatase: 50 U/L (ref 39–117)
BUN: 17 mg/dL (ref 6–23)
CO2: 24 meq/L (ref 19–32)
Calcium: 9.5 mg/dL (ref 8.4–10.5)
Chloride: 105 meq/L (ref 96–112)
Creatinine, Ser: 0.88 mg/dL (ref 0.40–1.50)
GFR: 95.83 mL/min (ref >60.00)
Glucose, Bld: 147 mg/dL — ABNORMAL HIGH (ref 70–99)
Potassium: 5 meq/L (ref 3.5–5.1)
Sodium: 141 meq/L (ref 135–145)
Total Bilirubin: 0.5 mg/dL (ref 0.2–1.2)
Total Protein: 6.8 g/dL (ref 6.0–8.3)

## 2023-10-01 LAB — LIPID PANEL
Cholesterol: 194 mg/dL (ref 0–200)
HDL: 60.8 mg/dL (ref >39.00)
LDL Cholesterol: 116 mg/dL — ABNORMAL HIGH (ref 0–99)
NonHDL: 132.73
Total CHOL/HDL Ratio: 3
Triglycerides: 86 mg/dL (ref 0.0–149.0)
VLDL: 17.2 mg/dL (ref 0.0–40.0)

## 2023-10-01 LAB — HEMOGLOBIN A1C: Hgb A1c MFr Bld: 8 % — ABNORMAL HIGH (ref 4.6–6.5)

## 2023-10-01 MED ORDER — ROSUVASTATIN CALCIUM 10 MG PO TABS
10.0000 mg | ORAL_TABLET | Freq: Every day | ORAL | 1 refills | Status: DC
  Filled 2023-10-01: qty 90, 90d supply, fill #0

## 2023-10-01 NOTE — Patient Instructions (Addendum)
 See information below regarding back pain.  Based on your description it is probably a pulled muscle and then some stiffness afterwards.  However I would like to see you next time this occurs so we can do an exam and discuss other treatment options.  See information below regarding treatment of back pain, including some regular exercises below and other techniques below may help prevent that from recurring.  I will check your labs today.  We do have some room to increase your Trulicity  if levels are elevated.  No changes for now.  The light and patches on the skin of the neck, leg and other areas could be vitiligo or a condition of decreased pigmentation.  At any point I am happy to refer you to dermatology to discuss this further, let me know.  Thank you again for coming in today and let me know if there are questions.   Preventive Care 81-50 Years Old, Male Preventive care refers to lifestyle choices and visits with your health care provider that can promote health and wellness. Preventive care visits are also called wellness exams. What can I expect for my preventive care visit? Counseling During your preventive care visit, your health care provider may ask about your: Medical history, including: Past medical problems. Family medical history. Current health, including: Emotional well-being. Home life and relationship well-being. Sexual activity. Lifestyle, including: Alcohol, nicotine or tobacco, and drug use. Access to firearms. Diet, exercise, and sleep habits. Safety issues such as seatbelt and bike helmet use. Sunscreen use. Work and work Astronomer. Physical exam Your health care provider will check your: Height and weight. These may be used to calculate your BMI (body mass index). BMI is a measurement that tells if you are at a healthy weight. Waist circumference. This measures the distance around your waistline. This measurement also tells if you are at a healthy weight and  may help predict your risk of certain diseases, such as type 2 diabetes and high blood pressure. Heart rate and blood pressure. Body temperature. Skin for abnormal spots. What immunizations do I need?  Vaccines are usually given at various ages, according to a schedule. Your health care provider will recommend vaccines for you based on your age, medical history, and lifestyle or other factors, such as travel or where you work. What tests do I need? Screening Your health care provider may recommend screening tests for certain conditions. This may include: Lipid and cholesterol levels. Diabetes screening. This is done by checking your blood sugar (glucose) after you have not eaten for a while (fasting). Hepatitis B test. Hepatitis C test. HIV (human immunodeficiency virus) test. STI (sexually transmitted infection) testing, if you are at risk. Lung cancer screening. Prostate cancer screening. Colorectal cancer screening. Talk with your health care provider about your test results, treatment options, and if necessary, the need for more tests. Follow these instructions at home: Eating and drinking  Eat a diet that includes fresh fruits and vegetables, whole grains, lean protein, and low-fat dairy products. Take vitamin and mineral supplements as recommended by your health care provider. Do not drink alcohol if your health care provider tells you not to drink. If you drink alcohol: Limit how much you have to 0-2 drinks a day. Know how much alcohol is in your drink. In the [place] one drink equals one 12 oz bottle of beer (355 mL), one 5 oz glass of wine (148 mL), or one 1 oz glass of hard liquor (44 mL). Lifestyle Brush your teeth every  morning and night with fluoride toothpaste. Floss one time each day. Exercise for at least 30 minutes 5 or more days each week. Do not use any products that contain nicotine or tobacco. These products include cigarettes, chewing tobacco, and vaping devices,  such as e-cigarettes. If you need help quitting, ask your health care provider. Do not use drugs. If you are sexually active, practice safe sex. Use a condom or other form of protection to prevent STIs. Take aspirin only as told by your health care provider. Make sure that you understand how much to take and what form to take. Work with your health care provider to find out whether it is safe and beneficial for you to take aspirin daily. Find healthy ways to manage stress, such as: Meditation, yoga, or listening to music. Journaling. Talking to a trusted person. Spending time with friends and family. Minimize exposure to UV radiation to reduce your risk of skin cancer. Safety Always wear your seat belt while driving or riding in a vehicle. Do not drive: If you have been drinking alcohol. Do not ride with someone who has been drinking. When you are tired or distracted. While texting. If you have been using any mind-altering substances or drugs. Wear a helmet and other protective equipment during sports activities. If you have firearms in your house, make sure you follow all gun safety procedures. What's next? Go to your health care provider once a year for an annual wellness visit. Ask your health care provider how often you should have your eyes and teeth checked. Stay up to date on all vaccines. This information is not intended to replace advice given to you by your health care provider. Make sure you discuss any questions you have with your health care provider. Document Revised: 10/30/2020 Document Reviewed: 10/30/2020 Elsevier Patient Education  2024 Elsevier Inc.     Acute Back Pain, Adult Acute back pain is sudden and usually short-lived. It is often caused by an injury to the muscles and tissues in the back. The injury may result from: A muscle, tendon, or ligament getting overstretched or torn. Ligaments are tissues that connect bones to each other. Lifting something  improperly can cause a back strain. Wear and tear (degeneration) of the spinal disks. Spinal disks are circular tissue that provide cushioning between the bones of the spine (vertebrae). Twisting motions, such as while playing sports or doing yard work. A hit to the back. Arthritis. You may have a physical exam, lab tests, and imaging tests to find the cause of your pain. Acute back pain usually goes away with rest and home care. Follow these instructions at home: Managing pain, stiffness, and swelling Take over-the-counter and prescription medicines only as told by your health care provider. Treatment may include medicines for pain and inflammation that are taken by mouth or applied to the skin, or muscle relaxants. Your health care provider may recommend applying ice during the first 24-48 hours after your pain starts. To do this: Put ice in a plastic bag. Place a towel between your skin and the bag. Leave the ice on for 20 minutes, 2-3 times a day. Remove the ice if your skin turns bright red. This is very important. If you cannot feel pain, heat, or cold, you have a greater risk of damage to the area. If directed, apply heat to the affected area as often as told by your health care provider. Use the heat source that your health care provider recommends, such as a moist  heat pack or a heating pad. Place a towel between your skin and the heat source. Leave the heat on for 20-30 minutes. Remove the heat if your skin turns bright red. This is especially important if you are unable to feel pain, heat, or cold. You have a greater risk of getting burned. Activity  Do not stay in bed. Staying in bed for more than 1-2 days can delay your recovery. Sit up and stand up straight. Avoid leaning forward when you sit or hunching over when you stand. If you work at a desk, sit close to it so you do not need to lean over. Keep your chin tucked in. Keep your neck drawn back, and keep your elbows bent at a  90-degree angle (right angle). Sit high and close to the steering wheel when you drive. Add lower back (lumbar) support to your car seat, if needed. Take short walks on even surfaces as soon as you are able. Try to increase the length of time you walk each day. Do not sit, drive, or stand in one place for more than 30 minutes at a time. Sitting or standing for long periods of time can put stress on your back. Do not drive or use heavy machinery while taking prescription pain medicine. Use proper lifting techniques. When you bend and lift, use positions that put less stress on your back: [place] your knees. Keep the load close to your body. Avoid twisting. Exercise regularly as told by your health care provider. Exercising helps your back heal faster and helps prevent back injuries by keeping muscles strong and flexible. Work with a physical therapist to make a safe exercise program, as recommended by your health care provider. Do any exercises as told by your physical therapist. Lifestyle Maintain a healthy weight. Extra weight puts stress on your back and makes it difficult to have good posture. Avoid activities or situations that make you feel anxious or stressed. Stress and anxiety increase muscle tension and can make back pain worse. Learn ways to manage anxiety and stress, such as through exercise. General instructions Sleep on a firm mattress in a comfortable position. Try lying on your side with your knees slightly bent. If you lie on your back, put a pillow under your knees. Keep your head and neck in a straight line with your spine (neutral position) when using electronic equipment like smartphones or pads. To do this: Raise your smartphone or pad to look at it instead of bending your head or neck to look down. Put the smartphone or pad at the level of your face while looking at the screen. Follow your treatment plan as told by your health care provider. This may include: Cognitive or  behavioral therapy. Acupuncture or massage therapy. Meditation or yoga. Contact a health care provider if: You have pain that is not relieved with rest or medicine. You have increasing pain going down into your legs or buttocks. Your pain does not improve after 2 weeks. You have pain at night. You lose weight without trying. You have a fever or chills. You develop nausea or vomiting. You develop abdominal pain. Get help right away if: You develop new bowel or bladder control problems. You have unusual weakness or numbness in your arms or legs. You feel faint. These symptoms may represent a serious problem that is an emergency. Do not wait to see if the symptoms will go away. Get medical help right away. Call your local emergency services (911 in the [place] Do  not drive yourself to the hospital. Summary Acute back pain is sudden and usually short-lived. Use proper lifting techniques. When you bend and lift, use positions that put less stress on your back. Take over-the-counter and prescription medicines only as told by your health care provider, and apply heat or ice as told. This information is not intended to replace advice given to you by your health care provider. Make sure you discuss any questions you have with your health care provider. Document Revised: 07/26/2020 Document Reviewed: 07/26/2020 Elsevier Patient Education  2024 [employer].

## 2023-10-01 NOTE — Progress Notes (Signed)
 Subjective:  Patient ID: Marcus Hart, male    DOB: 11-11-1965  Age: 58 y.o. MRN: [id number]  CC:  Chief Complaint  Patient presents with   Annual Exam    Pt notes doing pretty well but does have a question about some intermittent back pain, pt is fasting today    Back Pain    Pt notes he will get intermittent back pain at random notes starts with a shooting pain and stiffness then after a week or two and exercises this will go away. Started apx 1 year ago     HPI Marcus Hart presents for Annual Exam   PCP, me No other specialist at this time.  Back pain Notes pain has been present for the past year.  Intermittent shooting pains - noticed few months ago for last flare. Bending down, both sides of back, no leg radiation. Pain followed by stiffness for a few weeks. Improves with exercises at home. No current symptoms today.  No bowel or bladder incontinence, no saddle anesthesia, no lower extremity weakness. No night sweats, fevers, weight loss. last lumbar spine imaging in 2014.  Unremarkable at that time. Has physiotherapist at work - has met with her a few times - told it was muscle pain.   Diabetes:  Complicated by hyperglycemia, last discussed in January.  Treated with metformin  1000 g twice daily, Trulicity  4.5 mg weekly, some difficulty with obtaining medications previously.  Had been off meds and then restarted on Trulicity , initially lower dose.  Had tried Ozempic  previously with nausea and vomiting.  Had also been on a sulfonylurea which was discontinued. Statin has been prescribed, Crestor  10 mg daily, off medication at his last visit, unsure of medication coverage with insurance.  Trulicity  dose was increased to 1.5 mg after his last visit, his A1c was improved but still elevated at 7.8.  Still on same dose, along with metformin  1000 mg twice daily. No new side effects on meds above - no n/v/neck swelling/constipation, diarrhea.  Home readings fasting:  110-120 Postprandial: 170-180. Rare 200.  Symptomatic lows: none.   Microalbumin: Normal ratio 02/19/2023 Optho, foot exam, pneumovax:  Ophthalmology exam: last year for glasses, appt with optho - Dr. Diedre Fox this month.  Pneumonia vaccine -due, last given in 2019.  Had fever with last vaccine - declines repeat.   Lab Results  Component Value Date   HGBA1C 7.8 (H) 06/18/2023   HGBA1C 9.2 (H) 02/19/2023   HGBA1C 7.6 (H) 08/10/2022   Lab Results  Component Value Date   MICROALBUR <0.7 02/19/2023   LDLCALC 109 (H) 02/19/2023   CREATININE 0.79 06/18/2023   Hyperlipidemia: Better experience with [facility] community [facility]. Crestor  10mg  every day since last visit.  Lab Results  Component Value Date   CHOL 189 02/19/2023   HDL 66.50 02/19/2023   LDLCALC 109 (H) 02/19/2023   TRIG 67.0 02/19/2023   CHOLHDL 3 02/19/2023   Lab Results  Component Value Date   ALT 17 06/18/2023   AST 18 06/18/2023   ALKPHOS 52 06/18/2023   BILITOT 0.6 06/18/2023    Thyromegaly Noted on exam last visit.  TSH was normal.  Ultrasound was obtained without discrete nodule and normal sonographic appearance. Skin felt dry the day after test. Itching of area, used otc lotion.  Light colored patches afterward. Some other areas of light patches on skin in other areas for a long time. Not interested in derm eval at this time, not bothering at this time.  10/01/2023    8:11 AM 06/18/2023    9:45 AM 02/19/2023    9:19 AM 08/07/2022   10:28 AM 04/30/2022    8:14 AM  Depression screen PHQ 2/9  Decreased Interest 0 0 0 0 0  Down, Depressed, Hopeless 0 0 0 0 0  PHQ - 2 Score 0 0 0 0 0  Altered sleeping 0 0 0 0 0  Tired, decreased energy 1 0 1 0 1  Change in appetite 1 0 0 0 1  Feeling bad or failure about yourself  0 0 0 0 0  Trouble concentrating 0 0 0 0 0  Moving slowly or fidgety/restless 0 0 0 0 0  Suicidal thoughts 0 0 0 0 0  PHQ-9 Score 2 0 1 0 2  Difficult doing work/chores Not difficult at  all        Health Maintenance  Topic Date Due   Pneumococcal Vaccine 71-31 Years old (2 of 2 - PCV) 06/18/2018   OPHTHALMOLOGY EXAM  05/28/2022   HEMOGLOBIN A1C  12/16/2023   INFLUENZA VACCINE  12/17/2023   Diabetic kidney evaluation - Urine ACR  02/19/2024   Diabetic kidney evaluation - eGFR measurement  06/17/2024   FOOT EXAM  06/17/2024   Colonoscopy  10/28/2027   DTaP/Tdap/Td (3 - Td or Tdap) 02/18/2033   Hepatitis C Screening  Completed   HIV Screening  Completed   Zoster Vaccines- Shingrix   Completed   HPV VACCINES  Aged Out   Meningococcal B Vaccine  Aged Out   COVID-19 Vaccine  Discontinued  Colon cancer screening, colonoscopy in June 2019.Dr. Dominic Friendly.  Single polyp removed, not precancerous, repeat colonoscopy 10 years Prostate: does not have family history of prostate cancer The natural history of prostate cancer and ongoing controversy regarding screening and potential treatment outcomes of prostate cancer has been discussed with the patient. The meaning of a false positive PSA and a false negative PSA has been discussed. He indicates understanding of the limitations of this screening test and wishes  to proceed with screening PSA testing. Lab Results  Component Value Date   PSA1 0.3 06/18/2017   PSA 0.33 07/24/2015   PSA 0.25 02/13/2013      Immunization History  Administered Date(s) Administered   Influenza,inj,Quad PF,6+ Mos 02/13/2013, 05/07/2014, 04/06/2015   Influenza-Unspecified 02/13/2013, 05/07/2014, 04/06/2015   Meningococcal B Recombinant 09/24/2017   Meningococcal Conjugate 09/08/2014, 09/24/2017   Meningococcal Mcv4o 09/08/2014   Pneumococcal Polysaccharide-23 06/18/2017   Pneumococcal-Unspecified 06/18/2017   Tdap 02/13/2013, 02/19/2023   Zoster Recombinant(Shingrix ) 02/14/2021, 09/02/2021    Vision Screening   Right eye Left eye Both eyes  Without correction     With correction 20/20 20/20 20/20   Optho appt this month.    Dental: every 6  months.   Alcohol: none  Tobacco: none  Exercise: gym - few days per week, 1 hour. Cardio and resistance.    History Patient Active Problem List   Diagnosis Date Noted   Type 2 diabetes mellitus with hyperglycemia, without long-term current use of insulin (HCC) 02/13/2013   Hyperlipidemia 02/13/2013   Past Medical History:  Diagnosis Date   Diabetes mellitus (HCC)    Diabetes mellitus without complication (HCC)    Phreesia 11/10/2019   Hyperlipidemia    Past Surgical History:  Procedure Laterality Date   COLONOSCOPY     NO PAST SURGERIES     UPPER GASTROINTESTINAL ENDOSCOPY     No Known Allergies Prior to Admission medications   Medication Sig Start  Date End Date Taking? Authorizing Provider  Aspirin-Calcium  Carbonate (BAYER WOMENS) 81-777 MG TABS Take by mouth. 04/17/19  Yes [provider]  Dulaglutide  (TRULICITY ) 1.5 MG/0.5ML SOAJ Inject 1.5 mg into the skin once a week. 06/20/23  Yes Benjiman Bras, MD  Insulin Pen Needle (PEN NEEDLES) 32G X 4 MM MISC Use with Victoza  to inject once daily 06/30/21  Yes Benjiman Bras, MD  Lancets [facility] DELICA PLUS [place]) MISC USE AS DIRECTED TO TEST BLOOD GLUCOSE 11/03/21  Yes Benjiman Bras, MD  metFORMIN  (GLUCOPHAGE ) 1000 MG tablet Take 1 tablet (1,000 mg total) by mouth 2 (two) times daily with a meal. 05/24/23  Yes Benjiman Bras, MD  [facility] VERIO test strip USE AS DIRECTED 05/31/23  Yes Benjiman Bras, MD  rosuvastatin  (CRESTOR ) 10 MG tablet Take 1 tablet (10 mg total) by mouth daily. 06/18/23  Yes Benjiman Bras, MD   Social History   Socioeconomic History   Marital status: Married    Spouse name: [name]   Number of children: 4   Years of education: Not on file   Highest education level: Bachelor's degree (e.g., BA, AB, BS)  Occupational History   Occupation: Designer, television/film set  Tobacco Use   Smoking status: Never   Smokeless tobacco: Never  Vaping Use   Vaping status: Never Used  Substance and Sexual  Activity   Alcohol use: Never    Alcohol/week: 0.0 standard drinks of alcohol   Drug use: No   Sexual activity: Yes  Other Topics Concern   Not on file  Social History Narrative   Married   Education: Automotive engineer   Exercise: No   Social Drivers of Corporate investment banker Strain: Medium Risk (06/18/2023)   Overall Financial Resource Strain (CARDIA)    Difficulty of Paying Living Expenses: Somewhat hard  Food Insecurity: Food Insecurity Present (06/18/2023)   Hunger Vital Sign    Worried About Running Out of Food in the Last Year: Sometimes true    Ran Out of Food in the Last Year: Never true  Transportation Needs: No Transportation Needs (06/18/2023)   PRAPARE - Administrator, Civil Service (Medical): No    Lack of Transportation (Non-Medical): No  Physical Activity: Insufficiently Active (06/18/2023)   Exercise Vital Sign    Days of Exercise per Week: 1 day    Minutes of Exercise per Session: 30 min  Stress: No Stress Concern Present (06/18/2023)   [employer] of Occupational Health - Occupational Stress Questionnaire    Feeling of Stress : Not at all  Social Connections: Moderately Integrated (06/18/2023)   Social Connection and Isolation Panel [NHANES]    Frequency of Communication with Friends and Family: More than three times a week    Frequency of Social Gatherings with Friends and Family: Once a week    Attends Religious Services: More than 4 times per year    Active Member of [employer] or Organizations: No    Attends Engineer, structural: Not on file    Marital Status: Married  Catering manager Violence: Not on file    Review of Systems 13 point review of systems per patient health survey noted.  Negative other than as indicated above or in HPI.    Objective:   Vitals:   10/01/23 0807  BP: 122/64  Pulse: 82  Resp: 17  Temp: 97.8 F (36.6 C)  TempSrc: Temporal  SpO2: 100%  Weight: 171 lb 6.4 oz (77.7 kg)  Height: 5'  10.75" (1.797 m)      Physical Exam Vitals reviewed.  Constitutional:      Appearance: He is well-developed.  HENT:     Head: Normocephalic and atraumatic.     Right Ear: External ear normal.     Left Ear: External ear normal.  Eyes:     Conjunctiva/sclera: Conjunctivae normal.     Pupils: Pupils are equal, round, and reactive to light.  Neck:     Thyroid : No thyromegaly.  Cardiovascular:     Rate and Rhythm: Normal rate and regular rhythm.     Heart sounds: Normal heart sounds.  Pulmonary:     Effort: Pulmonary effort is normal. No respiratory distress.     Breath sounds: Normal breath sounds. No wheezing.  Abdominal:     General: There is no distension.     Palpations: Abdomen is soft.     Tenderness: There is no abdominal tenderness.  Musculoskeletal:        General: No tenderness. Normal range of motion.     Cervical back: Normal range of motion and neck supple.  Lymphadenopathy:     Cervical: No cervical adenopathy.  Skin:    General: Skin is warm and dry.     Comments: Few small areas of hypopigmentation on neck, right lower leg, right upper arm.  Neurological:     Mental Status: He is alert and oriented to person, place, and time.     Deep Tendon Reflexes: Reflexes are normal and symmetric.  Psychiatric:        Behavior: Behavior normal.           Assessment & Plan:  Freeman Dooley is a 58 y.o. male . Annual physical exam  - -anticipatory guidance as below in AVS, screening labs above. Health maintenance items as above in HPI discussed/recommended as applicable.   Hyperlipidemia, unspecified hyperlipidemia type - Plan: rosuvastatin  (CRESTOR ) 10 MG tablet, Comprehensive metabolic panel with GFR, Lipid panel  - Tolerating Crestor , check labs and update plan accordingly.  Type 2 diabetes mellitus with hyperglycemia, without long-term current use of insulin (HCC) - Plan: Hemoglobin A1c  - Tolerating current dose Trulicity , still some elevated postprandials, likely will  increase dose but check A1c first.  Continue metformin  same dose  Intermittent low back pain  - Likely mechanical back pain with intermittent flares.  No known injury, asymptomatic at present.  Handout given, RTC precautions if symptoms recur.  Home exercises discussed.  Skin hypopigmentation  - New areas on neck and noted after use of ultrasound gel, pruritus afterwards.  Area on leg, arm, possible vitiligo.  Option of dermatology eval, declined at this time.  Screening for prostate cancer - Plan: PSA   Meds ordered this encounter  Medications   rosuvastatin  (CRESTOR ) 10 MG tablet    Sig: Take 1 tablet (10 mg total) by mouth daily.    Dispense:  90 tablet    Refill:  1   Patient Instructions  See information below regarding back pain.  Based on your description it is probably a pulled muscle and then some stiffness afterwards.  However I would like to see you next time this occurs so we can do an exam and discuss other treatment options.  See information below regarding treatment of back pain, including some regular exercises below and other techniques below may help prevent that from recurring.  I will check your labs today.  We do have some room to increase your Trulicity  if levels are elevated.  No  changes for now.  The light and patches on the skin of the neck, leg and other areas could be vitiligo or a condition of decreased pigmentation.  At any point I am happy to refer you to dermatology to discuss this further, let me know.  Thank you again for coming in today and let me know if there are questions.   Preventive Care 53-62 Years Old, Male Preventive care refers to lifestyle choices and visits with your health care provider that can promote health and wellness. Preventive care visits are also called wellness exams. What can I expect for my preventive care visit? Counseling During your preventive care visit, your health care provider may ask about your: Medical history,  including: Past medical problems. Family medical history. Current health, including: Emotional well-being. Home life and relationship well-being. Sexual activity. Lifestyle, including: Alcohol, nicotine or tobacco, and drug use. Access to firearms. Diet, exercise, and sleep habits. Safety issues such as seatbelt and bike helmet use. Sunscreen use. Work and work Astronomer. Physical exam Your health care provider will check your: Height and weight. These may be used to calculate your BMI (body mass index). BMI is a measurement that tells if you are at a healthy weight. Waist circumference. This measures the distance around your waistline. This measurement also tells if you are at a healthy weight and may help predict your risk of certain diseases, such as type 2 diabetes and high blood pressure. Heart rate and blood pressure. Body temperature. Skin for abnormal spots. What immunizations do I need?  Vaccines are usually given at various ages, according to a schedule. Your health care provider will recommend vaccines for you based on your age, medical history, and lifestyle or other factors, such as travel or where you work. What tests do I need? Screening Your health care provider may recommend screening tests for certain conditions. This may include: Lipid and cholesterol levels. Diabetes screening. This is done by checking your blood sugar (glucose) after you have not eaten for a while (fasting). Hepatitis B test. Hepatitis C test. HIV (human immunodeficiency virus) test. STI (sexually transmitted infection) testing, if you are at risk. Lung cancer screening. Prostate cancer screening. Colorectal cancer screening. Talk with your health care provider about your test results, treatment options, and if necessary, the need for more tests. Follow these instructions at home: Eating and drinking  Eat a diet that includes fresh fruits and vegetables, whole grains, lean protein, and  low-fat dairy products. Take vitamin and mineral supplements as recommended by your health care provider. Do not drink alcohol if your health care provider tells you not to drink. If you drink alcohol: Limit how much you have to 0-2 drinks a day. Know how much alcohol is in your drink. In the [place] one drink equals one 12 oz bottle of beer (355 mL), one 5 oz glass of wine (148 mL), or one 1 oz glass of hard liquor (44 mL). Lifestyle Brush your teeth every morning and night with fluoride toothpaste. Floss one time each day. Exercise for at least 30 minutes 5 or more days each week. Do not use any products that contain nicotine or tobacco. These products include cigarettes, chewing tobacco, and vaping devices, such as e-cigarettes. If you need help quitting, ask your health care provider. Do not use drugs. If you are sexually active, practice safe sex. Use a condom or other form of protection to prevent STIs. Take aspirin only as told by your health care provider. Make sure that  you understand how much to take and what form to take. Work with your health care provider to find out whether it is safe and beneficial for you to take aspirin daily. Find healthy ways to manage stress, such as: Meditation, yoga, or listening to music. Journaling. Talking to a trusted person. Spending time with friends and family. Minimize exposure to UV radiation to reduce your risk of skin cancer. Safety Always wear your seat belt while driving or riding in a vehicle. Do not drive: If you have been drinking alcohol. Do not ride with someone who has been drinking. When you are tired or distracted. While texting. If you have been using any mind-altering substances or drugs. Wear a helmet and other protective equipment during sports activities. If you have firearms in your house, make sure you follow all gun safety procedures. What's next? Go to your health care provider once a year for an annual wellness  visit. Ask your health care provider how often you should have your eyes and teeth checked. Stay up to date on all vaccines. This information is not intended to replace advice given to you by your health care provider. Make sure you discuss any questions you have with your health care provider. Document Revised: 10/30/2020 Document Reviewed: 10/30/2020 Elsevier Patient Education  2024 Elsevier Inc.     Acute Back Pain, Adult Acute back pain is sudden and usually short-lived. It is often caused by an injury to the muscles and tissues in the back. The injury may result from: A muscle, tendon, or ligament getting overstretched or torn. Ligaments are tissues that connect bones to each other. Lifting something improperly can cause a back strain. Wear and tear (degeneration) of the spinal disks. Spinal disks are circular tissue that provide cushioning between the bones of the spine (vertebrae). Twisting motions, such as while playing sports or doing yard work. A hit to the back. Arthritis. You may have a physical exam, lab tests, and imaging tests to find the cause of your pain. Acute back pain usually goes away with rest and home care. Follow these instructions at home: Managing pain, stiffness, and swelling Take over-the-counter and prescription medicines only as told by your health care provider. Treatment may include medicines for pain and inflammation that are taken by mouth or applied to the skin, or muscle relaxants. Your health care provider may recommend applying ice during the first 24-48 hours after your pain starts. To do this: Put ice in a plastic bag. Place a towel between your skin and the bag. Leave the ice on for 20 minutes, 2-3 times a day. Remove the ice if your skin turns bright red. This is very important. If you cannot feel pain, heat, or cold, you have a greater risk of damage to the area. If directed, apply heat to the affected area as often as told by your health care  provider. Use the heat source that your health care provider recommends, such as a moist heat pack or a heating pad. Place a towel between your skin and the heat source. Leave the heat on for 20-30 minutes. Remove the heat if your skin turns bright red. This is especially important if you are unable to feel pain, heat, or cold. You have a greater risk of getting burned. Activity  Do not stay in bed. Staying in bed for more than 1-2 days can delay your recovery. Sit up and stand up straight. Avoid leaning forward when you sit or hunching over when you stand.  If you work at a desk, sit close to it so you do not need to lean over. Keep your chin tucked in. Keep your neck drawn back, and keep your elbows bent at a 90-degree angle (right angle). Sit high and close to the steering wheel when you drive. Add lower back (lumbar) support to your car seat, if needed. Take short walks on even surfaces as soon as you are able. Try to increase the length of time you walk each day. Do not sit, drive, or stand in one place for more than 30 minutes at a time. Sitting or standing for long periods of time can put stress on your back. Do not drive or use heavy machinery while taking prescription pain medicine. Use proper lifting techniques. When you bend and lift, use positions that put less stress on your back: Basin your knees. Keep the load close to your body. Avoid twisting. Exercise regularly as told by your health care provider. Exercising helps your back heal faster and helps prevent back injuries by keeping muscles strong and flexible. Work with a physical therapist to make a safe exercise program, as recommended by your health care provider. Do any exercises as told by your physical therapist. Lifestyle Maintain a healthy weight. Extra weight puts stress on your back and makes it difficult to have good posture. Avoid activities or situations that make you feel anxious or stressed. Stress and anxiety  increase muscle tension and can make back pain worse. Learn ways to manage anxiety and stress, such as through exercise. General instructions Sleep on a firm mattress in a comfortable position. Try lying on your side with your knees slightly bent. If you lie on your back, put a pillow under your knees. Keep your head and neck in a straight line with your spine (neutral position) when using electronic equipment like smartphones or pads. To do this: Raise your smartphone or pad to look at it instead of bending your head or neck to look down. Put the smartphone or pad at the level of your face while looking at the screen. Follow your treatment plan as told by your health care provider. This may include: Cognitive or behavioral therapy. Acupuncture or massage therapy. Meditation or yoga. Contact a health care provider if: You have pain that is not relieved with rest or medicine. You have increasing pain going down into your legs or buttocks. Your pain does not improve after 2 weeks. You have pain at night. You lose weight without trying. You have a fever or chills. You develop nausea or vomiting. You develop abdominal pain. Get help right away if: You develop new bowel or bladder control problems. You have unusual weakness or numbness in your arms or legs. You feel faint. These symptoms may represent a serious problem that is an emergency. Do not wait to see if the symptoms will go away. Get medical help right away. Call your local emergency services (911 in the [place] Do not drive yourself to the hospital. Summary Acute back pain is sudden and usually short-lived. Use proper lifting techniques. When you bend and lift, use positions that put less stress on your back. Take over-the-counter and prescription medicines only as told by your health care provider, and apply heat or ice as told. This information is not intended to replace advice given to you by your health care provider. Make sure  you discuss any questions you have with your health care provider. Document Revised: 07/26/2020 Document Reviewed: 07/26/2020 Elsevier Patient Education  2024 Elsevier Inc.        Signed,   Caro Christmas, MD [facility], [facility] 10/01/23 9:50 AM

## 2023-10-05 LAB — PSA: PSA: 0.23 ng/mL (ref 0.10–4.00)

## 2023-10-08 ENCOUNTER — Other Ambulatory Visit (HOSPITAL_COMMUNITY): Payer: Self-pay

## 2023-10-08 ENCOUNTER — Other Ambulatory Visit: Payer: Self-pay | Admitting: Family Medicine

## 2023-10-08 ENCOUNTER — Ambulatory Visit: Payer: Self-pay | Admitting: Family Medicine

## 2023-10-08 DIAGNOSIS — E1165 Type 2 diabetes mellitus with hyperglycemia: Secondary | ICD-10-CM

## 2023-10-08 MED ORDER — TRULICITY 3 MG/0.5ML ~~LOC~~ SOAJ
3.0000 mg | SUBCUTANEOUS | 1 refills | Status: DC
  Filled 2023-10-08 – 2023-10-15 (×2): qty 2, 28d supply, fill #0
  Filled 2023-11-22: qty 2, 28d supply, fill #1
  Filled 2023-12-15: qty 2, 28d supply, fill #2
  Filled 2024-01-14: qty 2, 28d supply, fill #3
  Filled 2024-02-11: qty 2, 28d supply, fill #4

## 2023-10-08 NOTE — Progress Notes (Signed)
 See lab results.

## 2023-10-15 ENCOUNTER — Other Ambulatory Visit: Payer: Self-pay

## 2023-10-15 ENCOUNTER — Other Ambulatory Visit (HOSPITAL_COMMUNITY): Payer: Self-pay

## 2023-11-03 ENCOUNTER — Encounter (HOSPITAL_BASED_OUTPATIENT_CLINIC_OR_DEPARTMENT_OTHER): Payer: Self-pay | Admitting: Emergency Medicine

## 2023-11-03 ENCOUNTER — Inpatient Hospital Stay (HOSPITAL_BASED_OUTPATIENT_CLINIC_OR_DEPARTMENT_OTHER)
Admission: EM | Admit: 2023-11-03 | Discharge: 2023-11-13 | DRG: 234 | Disposition: A | Discharge status: Home / Self Care | Attending: Thoracic Surgery (Cardiothoracic Vascular Surgery) | Admitting: Thoracic Surgery (Cardiothoracic Vascular Surgery)

## 2023-11-03 ENCOUNTER — Emergency Department (HOSPITAL_BASED_OUTPATIENT_CLINIC_OR_DEPARTMENT_OTHER)

## 2023-11-03 ENCOUNTER — Other Ambulatory Visit: Payer: Self-pay

## 2023-11-03 DIAGNOSIS — D696 Thrombocytopenia, unspecified: Secondary | ICD-10-CM | POA: Diagnosis present

## 2023-11-03 DIAGNOSIS — Z833 Family history of diabetes mellitus: Secondary | ICD-10-CM

## 2023-11-03 DIAGNOSIS — R079 Chest pain, unspecified: Secondary | ICD-10-CM | POA: Diagnosis not present

## 2023-11-03 DIAGNOSIS — Z7984 Long term (current) use of oral hypoglycemic drugs: Secondary | ICD-10-CM

## 2023-11-03 DIAGNOSIS — I214 Non-ST elevation (NSTEMI) myocardial infarction: Principal | ICD-10-CM | POA: Diagnosis present

## 2023-11-03 DIAGNOSIS — I2584 Coronary atherosclerosis due to calcified coronary lesion: Secondary | ICD-10-CM | POA: Diagnosis present

## 2023-11-03 DIAGNOSIS — E871 Hypo-osmolality and hyponatremia: Secondary | ICD-10-CM | POA: Diagnosis present

## 2023-11-03 DIAGNOSIS — Z951 Presence of aortocoronary bypass graft: Secondary | ICD-10-CM

## 2023-11-03 DIAGNOSIS — E875 Hyperkalemia: Secondary | ICD-10-CM | POA: Diagnosis not present

## 2023-11-03 DIAGNOSIS — Z8052 Family history of malignant neoplasm of bladder: Secondary | ICD-10-CM

## 2023-11-03 DIAGNOSIS — Z79899 Other long term (current) drug therapy: Secondary | ICD-10-CM

## 2023-11-03 DIAGNOSIS — I2510 Atherosclerotic heart disease of native coronary artery without angina pectoris: Secondary | ICD-10-CM | POA: Diagnosis present

## 2023-11-03 DIAGNOSIS — Z7985 Long-term (current) use of injectable non-insulin antidiabetic drugs: Secondary | ICD-10-CM

## 2023-11-03 DIAGNOSIS — E1165 Type 2 diabetes mellitus with hyperglycemia: Secondary | ICD-10-CM | POA: Diagnosis present

## 2023-11-03 DIAGNOSIS — E785 Hyperlipidemia, unspecified: Secondary | ICD-10-CM

## 2023-11-03 DIAGNOSIS — D62 Acute posthemorrhagic anemia: Secondary | ICD-10-CM | POA: Diagnosis present

## 2023-11-03 DIAGNOSIS — E7800 Pure hypercholesterolemia, unspecified: Secondary | ICD-10-CM | POA: Diagnosis present

## 2023-11-03 DIAGNOSIS — Z7982 Long term (current) use of aspirin: Secondary | ICD-10-CM

## 2023-11-03 DIAGNOSIS — Z8249 Family history of ischemic heart disease and other diseases of the circulatory system: Secondary | ICD-10-CM

## 2023-11-03 LAB — CBC WITH DIFFERENTIAL/PLATELET
Abs Immature Granulocytes: 0.01 10*3/uL (ref 0.00–0.07)
Basophils Absolute: 0 10*3/uL (ref 0.0–0.1)
Basophils Relative: 0 %
Eosinophils Absolute: 0.1 10*3/uL (ref 0.0–0.5)
Eosinophils Relative: 1 %
HCT: 37 % — ABNORMAL LOW (ref 39.0–52.0)
Hemoglobin: 12.4 g/dL — ABNORMAL LOW (ref 13.0–17.0)
Immature Granulocytes: 0 %
Lymphocytes Relative: 20 %
Lymphs Abs: 1 10*3/uL (ref 0.7–4.0)
MCH: 28.5 pg (ref 26.0–34.0)
MCHC: 33.5 g/dL (ref 30.0–36.0)
MCV: 85.1 fL (ref 80.0–100.0)
Monocytes Absolute: 0.3 10*3/uL (ref 0.1–1.0)
Monocytes Relative: 6 %
Neutro Abs: 3.7 10*3/uL (ref 1.7–7.7)
Neutrophils Relative %: 73 %
Platelets: 147 10*3/uL — ABNORMAL LOW (ref 150–400)
RBC: 4.35 MIL/uL (ref 4.22–5.81)
RDW: 13 % (ref 11.5–15.5)
WBC: 5.1 10*3/uL (ref 4.0–10.5)
nRBC: 0 % (ref 0.0–0.2)

## 2023-11-03 LAB — COMPREHENSIVE METABOLIC PANEL WITH GFR
ALT: 18 U/L (ref 0–44)
AST: 49 U/L — ABNORMAL HIGH (ref 15–41)
Albumin: 4 g/dL (ref 3.5–5.0)
Alkaline Phosphatase: 62 U/L (ref 38–126)
Anion gap: 10 (ref 5–15)
BUN: 13 mg/dL (ref 6–20)
CO2: 25 mmol/L (ref 22–32)
Calcium: 9.2 mg/dL (ref 8.9–10.3)
Chloride: 99 mmol/L (ref 98–111)
Creatinine, Ser: 0.93 mg/dL (ref 0.61–1.24)
GFR, Estimated: >60 mL/min (ref >60)
Glucose, Bld: 221 mg/dL — ABNORMAL HIGH (ref 70–99)
Potassium: 4.4 mmol/L (ref 3.5–5.1)
Sodium: 134 mmol/L — ABNORMAL LOW (ref 135–145)
Total Bilirubin: 0.5 mg/dL (ref 0.0–1.2)
Total Protein: 6.6 g/dL (ref 6.5–8.1)

## 2023-11-03 LAB — TROPONIN T, HIGH SENSITIVITY: Troponin T High Sensitivity: 383 ng/L (ref <19)

## 2023-11-03 LAB — LIPASE, BLOOD: Lipase: 29 U/L (ref 11–51)

## 2023-11-03 MED ORDER — HEPARIN BOLUS VIA INFUSION
4000.0000 [IU] | Freq: Once | INTRAVENOUS | Status: AC
  Administered 2023-11-04: 4000 [IU] via INTRAVENOUS

## 2023-11-03 MED ORDER — NITROGLYCERIN 2 % TD OINT
1.0000 [in_us] | TOPICAL_OINTMENT | Freq: Once | TRANSDERMAL | Status: AC
  Administered 2023-11-04: 1 [in_us] via TOPICAL
  Filled 2023-11-03: qty 1

## 2023-11-03 MED ORDER — HEPARIN (PORCINE) 25000 UT/250ML-% IV SOLN
900.0000 [IU]/h | INTRAVENOUS | Status: DC
  Administered 2023-11-04: 900 [IU]/h via INTRAVENOUS
  Filled 2023-11-03: qty 250

## 2023-11-03 MED ORDER — ASPIRIN 81 MG PO CHEW
324.0000 mg | CHEWABLE_TABLET | Freq: Once | ORAL | Status: AC
  Administered 2023-11-03: 324 mg via ORAL
  Filled 2023-11-03: qty 4

## 2023-11-03 NOTE — Progress Notes (Signed)
 PHARMACY - ANTICOAGULATION CONSULT NOTE  Pharmacy Consult for heparin Indication: chest pain/ACS  No Known Allergies  Patient Measurements: Height: 5' 10.75 (179.7 cm) Weight: 77.7 kg (171 lb 4.8 oz) (Wt from 10/01/2023) IBW/kg (Calculated) : 74.73 HEPARIN DW (KG): 77.7  Vital Signs: Temp: 98.6 F (37 C) (06/18 2216) Temp Source: Oral (06/18 2216) BP: 120/65 (06/18 2216) Pulse Rate: 74 (06/18 2216)  Labs: Recent Labs    11/03/23 2240  HGB 12.4*  HCT 37.0*  PLT 147*  CREATININE 0.93    Estimated Creatinine Clearance: 92.6 mL/min (by C-G formula based on SCr of 0.93 mg/dL).   Medical History: Past Medical History:  Diagnosis Date   Diabetes mellitus (HCC)    Diabetes mellitus without complication (HCC)    Phreesia 11/10/2019   Hyperlipidemia    Assessment: 84 yoM presented to ED with chest pain. Pharmacy consulted to dose heparin for ACS.  -Troponin 383 -CBC shows Hgb 12.4, plts 147 -No oral anticoagulation reported  Goal of Therapy:  Heparin level 0.3-0.7 units/ml Monitor platelets by anticoagulation protocol: Yes   Plan:  Give 4000 units bolus x 1 Start heparin infusion at 900 units/hr Check anti-Xa level in 6 hours and daily while on heparin Continue to monitor H&H and platelets  Young Hensen, PharmD, BCPS Clinical Pharmacist 11/03/2023 11:48 PM

## 2023-11-03 NOTE — ED Provider Notes (Signed)
 [place] EMERGENCY DEPARTMENT AT [facility] Provider Note   CSN: [id number] Arrival date & time: 11/03/23  2204     Patient presents with: Chest Pain   Marcus Hart is a 58 y.o. male.   HPI 58 year old male with a history of diabetes and hypercholesterolemia presents with chest pain.  For the past couple days he has been having on and off chest pain with no clear cause.  No exertional component or pain with food.  However today the pain has been more constant and lasting all day.  Right now the pain is not too bad and he rates it as about a 2 out of 10 in all of his chest.  No abdominal pain or back pain.  When the pain occurs he will have weakness in both arms that seems to radiate down.  No numbness.  Pain sometimes radiates up into his neck.  Nothing he has done has made the pain better or worse.  Prior to Admission medications   Medication Sig Start Date End Date Taking? Authorizing Provider  Aspirin-Calcium  Carbonate (BAYER WOMENS) [phone] MG TABS Take by mouth. 04/17/19   [provider]  Dulaglutide  (TRULICITY ) 3 MG/0.5ML SOAJ Inject 3 mg into the skin once a week. 10/08/23   Benjiman Bras, MD  Insulin Pen Needle (PEN NEEDLES) 32G X 4 MM MISC Use with Victoza  to inject once daily 06/30/21   Benjiman Bras, MD  Lancets [facility] DELICA PLUS [place]) MISC USE AS DIRECTED TO TEST BLOOD GLUCOSE 11/03/21   Benjiman Bras, MD  metFORMIN  (GLUCOPHAGE ) 1000 MG tablet Take 1 tablet (1,000 mg total) by mouth 2 (two) times daily with a meal. 05/24/23   Benjiman Bras, MD  [facility] VERIO test strip USE AS DIRECTED 05/31/23   Benjiman Bras, MD  rosuvastatin  (CRESTOR ) 10 MG tablet Take 1 tablet (10 mg total) by mouth daily. 10/01/23   Benjiman Bras, MD    Allergies: Patient has no known allergies.    Review of Systems  Respiratory:  Negative for shortness of breath.   Cardiovascular:  Positive for chest pain. Negative for leg swelling.   Gastrointestinal:  Negative for abdominal pain and vomiting.  Musculoskeletal:  Negative for back pain.    Updated Vital Signs BP 120/65 (BP Location: Left Arm)   Pulse 74   Temp 98.6 F (37 C) (Oral)   Resp 18   SpO2 99%   Physical Exam Vitals and nursing note reviewed.  Constitutional:      General: He is not in acute distress.    Appearance: He is well-developed. He is not ill-appearing or diaphoretic.  HENT:     Head: Normocephalic and atraumatic.   Cardiovascular:     Rate and Rhythm: Normal rate and regular rhythm.     Pulses:          Radial pulses are 2+ on the right side and 2+ on the left side.     Heart sounds: Normal heart sounds.  Pulmonary:     Effort: Pulmonary effort is normal.     Breath sounds: Normal breath sounds.  Abdominal:     Palpations: Abdomen is soft.     Tenderness: There is no abdominal tenderness.   Skin:    General: Skin is warm and dry.   Neurological:     Mental Status: He is alert.     Comments: 5/5 strength in BUE. Grossly normal sensation.    (all labs ordered are listed, but only  abnormal results are displayed) Labs Reviewed  CBC WITH DIFFERENTIAL/PLATELET - Abnormal; Notable for the following components:      Result Value   Hemoglobin 12.4 (*)    HCT 37.0 (*)    Platelets 147 (*)    All other components within normal limits  COMPREHENSIVE METABOLIC PANEL WITH GFR  LIPASE, BLOOD  TROPONIN T, HIGH SENSITIVITY    EKG: EKG Interpretation Date/Time:  Wednesday November 03 2023 22:15:30 EDT Ventricular Rate:  80 PR Interval:  154 QRS Duration:  94 QT Interval:  356 QTC Calculation: 411 R Axis:   82  Text Interpretation: Sinus rhythm Repol abnrm suggests ischemia, diffuse leads No old tracing to compare Confirmed by Jerilynn Montenegro [phone]) on 11/03/2023 10:20:41 PM  Radiology: DG Chest Portable 1 View Result Date: 11/03/2023 CLINICAL DATA:  Chest pain EXAM: PORTABLE CHEST 1 VIEW COMPARISON:  11/22/2015 FINDINGS: The heart  size and mediastinal contours are within normal limits. Both lungs are clear. The visualized skeletal structures are unremarkable. IMPRESSION: No active disease. Electronically Signed   By: Esmeralda Hedge M.D.   On: 11/03/2023 22:58     Procedures   Medications Ordered in the ED  aspirin chewable tablet 324 mg (324 mg Oral Given 11/03/23 2237)                                    Medical Decision Making Amount and/or Complexity of Data Reviewed Labs: ordered.    Details: Mild anemia Radiology: ordered.    Details: No CHF ECG/medicine tests: ordered and independent interpretation performed.    Details: Diffuse ST changes, no elevations  Risk OTC drugs.   Patient presents with on and off chest pain, now more consistent today.  Has a nonspecific ECG with no ST elevations.  Unfortunately there is no old to compare to so is hard to tell if this is new or old.  Currently lab work is pending including troponin.  Care transferred to Dr. Lula Sale.     Final diagnoses:  None    ED Discharge Orders     None          Jerilynn Montenegro, MD 11/03/23 [phone]

## 2023-11-03 NOTE — ED Triage Notes (Signed)
 Chest pain, DOE , fatigue, chills X 3 days Had bilateral weakness in hand 3 days ago

## 2023-11-04 ENCOUNTER — Encounter (HOSPITAL_COMMUNITY)
Admission: EM | Disposition: A | Discharge status: Home / Self Care | Payer: Self-pay | Source: Home / Self Care | Attending: Internal Medicine

## 2023-11-04 ENCOUNTER — Encounter (HOSPITAL_COMMUNITY): Payer: Self-pay | Admitting: Cardiology

## 2023-11-04 ENCOUNTER — Inpatient Hospital Stay (HOSPITAL_COMMUNITY)

## 2023-11-04 DIAGNOSIS — Z7984 Long term (current) use of oral hypoglycemic drugs: Secondary | ICD-10-CM | POA: Diagnosis not present

## 2023-11-04 DIAGNOSIS — E871 Hypo-osmolality and hyponatremia: Secondary | ICD-10-CM | POA: Diagnosis present

## 2023-11-04 DIAGNOSIS — E875 Hyperkalemia: Secondary | ICD-10-CM | POA: Diagnosis not present

## 2023-11-04 DIAGNOSIS — I214 Non-ST elevation (NSTEMI) myocardial infarction: Secondary | ICD-10-CM

## 2023-11-04 DIAGNOSIS — Z79899 Other long term (current) drug therapy: Secondary | ICD-10-CM | POA: Diagnosis not present

## 2023-11-04 DIAGNOSIS — I2510 Atherosclerotic heart disease of native coronary artery without angina pectoris: Secondary | ICD-10-CM

## 2023-11-04 DIAGNOSIS — E1165 Type 2 diabetes mellitus with hyperglycemia: Secondary | ICD-10-CM | POA: Diagnosis present

## 2023-11-04 DIAGNOSIS — E785 Hyperlipidemia, unspecified: Secondary | ICD-10-CM

## 2023-11-04 DIAGNOSIS — Z8249 Family history of ischemic heart disease and other diseases of the circulatory system: Secondary | ICD-10-CM | POA: Diagnosis not present

## 2023-11-04 DIAGNOSIS — R079 Chest pain, unspecified: Secondary | ICD-10-CM | POA: Diagnosis present

## 2023-11-04 DIAGNOSIS — R531 Weakness: Secondary | ICD-10-CM | POA: Diagnosis not present

## 2023-11-04 DIAGNOSIS — E118 Type 2 diabetes mellitus with unspecified complications: Secondary | ICD-10-CM | POA: Diagnosis not present

## 2023-11-04 DIAGNOSIS — I25110 Atherosclerotic heart disease of native coronary artery with unstable angina pectoris: Secondary | ICD-10-CM | POA: Diagnosis not present

## 2023-11-04 DIAGNOSIS — E7800 Pure hypercholesterolemia, unspecified: Secondary | ICD-10-CM | POA: Diagnosis present

## 2023-11-04 DIAGNOSIS — Z743 Need for continuous supervision: Secondary | ICD-10-CM | POA: Diagnosis not present

## 2023-11-04 DIAGNOSIS — Z7985 Long-term (current) use of injectable non-insulin antidiabetic drugs: Secondary | ICD-10-CM | POA: Diagnosis not present

## 2023-11-04 DIAGNOSIS — Z7982 Long term (current) use of aspirin: Secondary | ICD-10-CM | POA: Diagnosis not present

## 2023-11-04 DIAGNOSIS — I2584 Coronary atherosclerosis due to calcified coronary lesion: Secondary | ICD-10-CM | POA: Diagnosis present

## 2023-11-04 DIAGNOSIS — Z01810 Encounter for preprocedural cardiovascular examination: Secondary | ICD-10-CM | POA: Diagnosis not present

## 2023-11-04 DIAGNOSIS — E119 Type 2 diabetes mellitus without complications: Secondary | ICD-10-CM | POA: Diagnosis not present

## 2023-11-04 DIAGNOSIS — D696 Thrombocytopenia, unspecified: Secondary | ICD-10-CM | POA: Diagnosis present

## 2023-11-04 DIAGNOSIS — Z833 Family history of diabetes mellitus: Secondary | ICD-10-CM | POA: Diagnosis not present

## 2023-11-04 DIAGNOSIS — Z8052 Family history of malignant neoplasm of bladder: Secondary | ICD-10-CM | POA: Diagnosis not present

## 2023-11-04 DIAGNOSIS — Z951 Presence of aortocoronary bypass graft: Secondary | ICD-10-CM | POA: Diagnosis present

## 2023-11-04 DIAGNOSIS — D62 Acute posthemorrhagic anemia: Secondary | ICD-10-CM | POA: Diagnosis present

## 2023-11-04 HISTORY — PX: CORONARY PRESSURE/FFR STUDY: CATH118243

## 2023-11-04 HISTORY — PX: LEFT HEART CATH AND CORONARY ANGIOGRAPHY: CATH118249

## 2023-11-04 HISTORY — PX: CORONARY ULTRASOUND/IVUS: CATH118244

## 2023-11-04 LAB — IRON AND TIBC
Iron: 33 ug/dL — ABNORMAL LOW (ref 45–182)
Saturation Ratios: 9 % — ABNORMAL LOW (ref 17.9–39.5)
TIBC: 386 ug/dL (ref 250–450)
UIBC: 353 ug/dL

## 2023-11-04 LAB — POCT ACTIVATED CLOTTING TIME: Activated Clotting Time: 360 s

## 2023-11-04 LAB — LIPID PANEL
Cholesterol: 157 mg/dL (ref 0–200)
HDL: 60 mg/dL (ref >40)
LDL Cholesterol: 84 mg/dL (ref 0–99)
Total CHOL/HDL Ratio: 2.6 ratio
Triglycerides: 64 mg/dL (ref <150)
VLDL: 13 mg/dL (ref 0–40)

## 2023-11-04 LAB — COMPREHENSIVE METABOLIC PANEL WITH GFR
ALT: 19 U/L (ref 0–44)
AST: 42 U/L — ABNORMAL HIGH (ref 15–41)
Albumin: 3.4 g/dL — ABNORMAL LOW (ref 3.5–5.0)
Alkaline Phosphatase: 45 U/L (ref 38–126)
Anion gap: 8 (ref 5–15)
BUN: 12 mg/dL (ref 6–20)
CO2: 26 mmol/L (ref 22–32)
Calcium: 9 mg/dL (ref 8.9–10.3)
Chloride: 103 mmol/L (ref 98–111)
Creatinine, Ser: 0.85 mg/dL (ref 0.61–1.24)
GFR, Estimated: >60 mL/min (ref >60)
Glucose, Bld: 144 mg/dL — ABNORMAL HIGH (ref 70–99)
Potassium: 4 mmol/L (ref 3.5–5.1)
Sodium: 137 mmol/L (ref 135–145)
Total Bilirubin: 0.8 mg/dL (ref 0.0–1.2)
Total Protein: 6.3 g/dL — ABNORMAL LOW (ref 6.5–8.1)

## 2023-11-04 LAB — HIV ANTIBODY (ROUTINE TESTING W REFLEX): HIV Screen 4th Generation wRfx: NONREACTIVE

## 2023-11-04 LAB — CBG MONITORING, ED
Glucose-Capillary: 130 mg/dL — ABNORMAL HIGH (ref 70–99)
Glucose-Capillary: 132 mg/dL — ABNORMAL HIGH (ref 70–99)
Glucose-Capillary: 120 mg/dL — ABNORMAL HIGH (ref 70–99)

## 2023-11-04 LAB — ECHOCARDIOGRAM COMPLETE
Area-P 1/2: 3.95 cm2
Calc EF: 55.8 %
Height: 70.75 in
S' Lateral: 2.8 cm
Single Plane A2C EF: 57.9 %
Single Plane A4C EF: 47.8 %
Weight: 2740.76 [oz_av]

## 2023-11-04 LAB — TYPE AND SCREEN
ABO/RH(D): A NEG
Antibody Screen: NEGATIVE

## 2023-11-04 LAB — CBC
HCT: 38.5 % — ABNORMAL LOW (ref 39.0–52.0)
Hemoglobin: 12.6 g/dL — ABNORMAL LOW (ref 13.0–17.0)
MCH: 28.3 pg (ref 26.0–34.0)
MCHC: 32.7 g/dL (ref 30.0–36.0)
MCV: 86.5 fL (ref 80.0–100.0)
Platelets: 154 10*3/uL (ref 150–400)
RBC: 4.45 MIL/uL (ref 4.22–5.81)
RDW: 12.9 % (ref 11.5–15.5)
WBC: 5.1 10*3/uL (ref 4.0–10.5)
nRBC: 0 % (ref 0.0–0.2)

## 2023-11-04 LAB — TROPONIN T, HIGH SENSITIVITY: Troponin T High Sensitivity: 412 ng/L (ref <19)

## 2023-11-04 LAB — MAGNESIUM: Magnesium: 1.9 mg/dL (ref 1.7–2.4)

## 2023-11-04 LAB — GLUCOSE, CAPILLARY: Glucose-Capillary: 212 mg/dL — ABNORMAL HIGH (ref 70–99)

## 2023-11-04 LAB — HEPARIN LEVEL (UNFRACTIONATED): Heparin Unfractionated: 0.3 [IU]/mL (ref 0.30–0.70)

## 2023-11-04 LAB — HEMOGLOBIN A1C
Hgb A1c MFr Bld: 7.6 % — ABNORMAL HIGH (ref 4.8–5.6)
Mean Plasma Glucose: 171.42 mg/dL

## 2023-11-04 LAB — FERRITIN: Ferritin: 84 ng/mL (ref 24–336)

## 2023-11-04 LAB — TSH: TSH: 1.231 u[IU]/mL (ref 0.350–4.500)

## 2023-11-04 LAB — ABO/RH: ABO/RH(D): A NEG

## 2023-11-04 SURGERY — LEFT HEART CATH AND CORONARY ANGIOGRAPHY
Anesthesia: LOCAL

## 2023-11-04 MED ORDER — ROSUVASTATIN CALCIUM 20 MG PO TABS: 40.0000 mg | ORAL_TABLET | Freq: Every day | ORAL | Status: DC

## 2023-11-04 MED ORDER — METOPROLOL TARTRATE 12.5 MG HALF TABLET
12.5000 mg | ORAL_TABLET | Freq: Two times a day (BID) | ORAL | Status: DC
  Administered 2023-11-05 – 2023-11-07 (×6): 12.5 mg via ORAL
  Filled 2023-11-04 (×7): qty 1

## 2023-11-04 MED ORDER — HEPARIN SODIUM (PORCINE) 1000 UNIT/ML IJ SOLN
INTRAMUSCULAR | Status: AC
  Filled 2023-11-04: qty 10

## 2023-11-04 MED ORDER — LIDOCAINE HCL (PF) 1 % IJ SOLN
INTRAMUSCULAR | Status: AC
  Filled 2023-11-04: qty 30

## 2023-11-04 MED ORDER — HEPARIN (PORCINE) 25000 UT/250ML-% IV SOLN
1300.0000 [IU]/h | INTRAVENOUS | Status: DC
  Administered 2023-11-04: 900 [IU]/h via INTRAVENOUS
  Administered 2023-11-05: 1150 [IU]/h via INTRAVENOUS
  Administered 2023-11-06 – 2023-11-07 (×2): 1300 [IU]/h via INTRAVENOUS
  Filled 2023-11-04 (×4): qty 250

## 2023-11-04 MED ORDER — SODIUM CHLORIDE 0.9 % WEIGHT BASED INFUSION: 1.0000 mL/kg/h | INTRAVENOUS | Status: DC

## 2023-11-04 MED ORDER — ACETAMINOPHEN 325 MG PO TABS
650.0000 mg | ORAL_TABLET | ORAL | Status: AC | PRN
Start: 2023-11-04 — End: ?

## 2023-11-04 MED ORDER — ROSUVASTATIN CALCIUM 20 MG PO TABS
40.0000 mg | ORAL_TABLET | Freq: Every day | ORAL | Status: DC
  Administered 2023-11-04 – 2023-11-13 (×10): 40 mg via ORAL
  Filled 2023-11-04 (×10): qty 2

## 2023-11-04 MED ORDER — HEPARIN (PORCINE) IN NACL 1000-0.9 UT/500ML-% IV SOLN
INTRAVENOUS | Status: DC | PRN
  Administered 2023-11-04 (×2): 500 mL

## 2023-11-04 MED ORDER — ASPIRIN 81 MG PO TBEC
81.0000 mg | DELAYED_RELEASE_TABLET | Freq: Every day | ORAL | Status: DC
  Administered 2023-11-05 – 2023-11-08 (×4): 81 mg via ORAL
  Filled 2023-11-04 (×4): qty 1

## 2023-11-04 MED ORDER — MIDAZOLAM HCL 2 MG/2ML IJ SOLN
INTRAMUSCULAR | Status: DC | PRN
  Administered 2023-11-04 (×2): 1 mg via INTRAVENOUS

## 2023-11-04 MED ORDER — HEPARIN SODIUM (PORCINE) 1000 UNIT/ML IJ SOLN
INTRAMUSCULAR | Status: DC | PRN
  Administered 2023-11-04 (×2): 4000 [IU] via INTRAVENOUS

## 2023-11-04 MED ORDER — FENTANYL CITRATE (PF) 100 MCG/2ML IJ SOLN
INTRAMUSCULAR | Status: DC | PRN
  Administered 2023-11-04 (×2): 25 ug via INTRAVENOUS

## 2023-11-04 MED ORDER — ASPIRIN 81 MG PO CHEW
81.0000 mg | CHEWABLE_TABLET | ORAL | Status: AC
  Administered 2023-11-04: 81 mg via ORAL
  Filled 2023-11-04: qty 1

## 2023-11-04 MED ORDER — LIDOCAINE HCL (PF) 1 % IJ SOLN
INTRAMUSCULAR | Status: DC | PRN
  Administered 2023-11-04: 5 mL

## 2023-11-04 MED ORDER — SODIUM CHLORIDE 0.9 % WEIGHT BASED INFUSION
3.0000 mL/kg/h | INTRAVENOUS | Status: AC
Start: 2023-11-04 — End: 2023-11-04

## 2023-11-04 MED ORDER — IOHEXOL 350 MG/ML SOLN
INTRAVENOUS | Status: DC | PRN
  Administered 2023-11-04: 92 mL

## 2023-11-04 MED ORDER — FENTANYL CITRATE (PF) 100 MCG/2ML IJ SOLN
INTRAMUSCULAR | Status: AC
Start: 2023-11-04 — End: 2023-11-04
  Filled 2023-11-04: qty 2

## 2023-11-04 MED ORDER — PERFLUTREN LIPID MICROSPHERE
1.0000 mL | INTRAVENOUS | Status: AC | PRN
  Administered 2023-11-04: 5 mL via INTRAVENOUS

## 2023-11-04 MED ORDER — ONDANSETRON HCL 4 MG/2ML IJ SOLN: 4.0000 mg | Freq: Four times a day (QID) | INTRAMUSCULAR | Status: DC | PRN

## 2023-11-04 MED ORDER — HEPARIN (PORCINE) IN NACL 2-0.9 UNITS/ML
INTRAMUSCULAR | Status: DC | PRN
  Administered 2023-11-04: 10 mL via INTRA_ARTERIAL

## 2023-11-04 MED ORDER — MIDAZOLAM HCL 2 MG/2ML IJ SOLN
INTRAMUSCULAR | Status: AC
Start: 2023-11-04 — End: 2023-11-04
  Filled 2023-11-04: qty 2

## 2023-11-04 MED ORDER — VERAPAMIL HCL 2.5 MG/ML IV SOLN
INTRAVENOUS | Status: AC
  Filled 2023-11-04: qty 2

## 2023-11-04 MED ORDER — NITROGLYCERIN IN D5W 200-5 MCG/ML-% IV SOLN
0.0000 ug/min | INTRAVENOUS | Status: DC
  Administered 2023-11-04: 5 ug/min via INTRAVENOUS
  Filled 2023-11-04: qty 250

## 2023-11-04 MED ORDER — INSULIN ASPART 100 UNIT/ML IJ SOLN
0.0000 [IU] | Freq: Four times a day (QID) | INTRAMUSCULAR | Status: DC
  Administered 2023-11-04: 2 [IU] via SUBCUTANEOUS

## 2023-11-04 SURGICAL SUPPLY — 15 items
CATH 5FR JL3.5 JR4 ANG PIG MP (CATHETERS) IMPLANT
CATH LAUNCHER 6FR EBU 3 (CATHETERS) IMPLANT
CATH OPTICROSS HD (CATHETERS) IMPLANT
DEVICE RAD COMP TR BAND LRG (VASCULAR PRODUCTS) IMPLANT
DRAPE IVUS SLED (BAG) IMPLANT
ELECT DEFIB PAD ADLT CADENCE (PAD) IMPLANT
GLIDESHEATH SLEND A-KIT 6F 22G (SHEATH) IMPLANT
GUIDEWIRE INQWIRE 1.5J.035X260 (WIRE) IMPLANT
GUIDEWIRE PRESSURE X 175 (WIRE) IMPLANT
KIT ENCORE 26 ADVANTAGE (KITS) IMPLANT
KIT HEMO VALVE WATCHDOG (MISCELLANEOUS) IMPLANT
PACK CARDIAC CATHETERIZATION (CUSTOM PROCEDURE TRAY) ×1 IMPLANT
SET ATX-X65L (MISCELLANEOUS) IMPLANT
SHEATH PROBE COVER 6X72 (BAG) IMPLANT
WIRE RUNTHROUGH .014X180CM (WIRE) IMPLANT

## 2023-11-04 NOTE — ED Notes (Signed)
 Pt arrives from via [facility] from [facility] with NSTEMI. Heparin @9 /hr. Pt ambulatory, alert and oriented. VSS.

## 2023-11-04 NOTE — H&P (View-Only) (Signed)
 Cardiology Attending Note: Reviewed H&P of Dr Vira Grieves from this morning.  Patient's wife at bedside.  Agree with his assessment, plan, and recommendation for cardiac catheterization. Discussed with patient again. Reviewed plan for cardiac catheterization and possible PCI to evaluate ACS in setting of background medical problems of diabetes and hyperlipidemia.   I have reviewed the risks, indications, and alternatives to cardiac catheterization, possible angioplasty, and stenting with the patient. Risks include but are not limited to bleeding, infection, vascular injury, stroke, myocardial infection, arrhythmia, kidney injury, radiation-related injury in the case of prolonged fluoroscopy use, emergency cardiac surgery, and death. The patient understands the risks of serious complication is 1-2 in 1000 with diagnostic cardiac cath and 1-2% or less with angioplasty/stenting.    Arnoldo Lapping 11/04/2023

## 2023-11-04 NOTE — Progress Notes (Signed)
 PHARMACY - ANTICOAGULATION CONSULT NOTE  Pharmacy Consult for heparin Indication: chest pain/ACS  No Known Allergies  Patient Measurements: Height: 5' 10.75 (179.7 cm) Weight: 77.7 kg (171 lb 4.8 oz) (Wt from 10/01/2023) IBW/kg (Calculated) : 74.73 HEPARIN DW (KG): 77.7  Vital Signs: Temp: 98.6 F (37 C) (06/19 0750) Temp Source: Oral (06/19 0750) BP: 103/79 (06/19 0750) Pulse Rate: 70 (06/19 0750)  Labs: Recent Labs    11/03/23 2240 11/04/23 0356 11/04/23 0816  HGB 12.4* 12.6*  --   HCT 37.0* 38.5*  --   PLT 147* 154  --   HEPARINUNFRC  --   --  0.30  CREATININE 0.93 0.85  --     Estimated Creatinine Clearance: 101.3 mL/min (by C-G formula based on SCr of 0.85 mg/dL).   Medical History: Past Medical History:  Diagnosis Date   Diabetes mellitus (HCC)    Diabetes mellitus without complication (HCC)    Phreesia 11/10/2019   Hyperlipidemia    Assessment: 33 yoM presented to ED with chest pain found to have NSTEMI. Troponin elevated with plans for cardiac cath. No anticoagulation prior to admission. Pharmacy consulted to dose heparin for ACS.  Heparin level 0.3, therapeutic No overt s/sx bleeding or issues with infusion.   Goal of Therapy:  Heparin level 0.3-0.7 units/ml Monitor platelets by anticoagulation protocol: Yes   Plan:  Continue 900 units/hr  Daily heparin level and CBC while on heparin F/u cardiology recommendations and plans for cath   Thank you for allowing pharmacy to participate in this patient's care.  Fonda Hymen, PharmD Emergency Medicine Clinical Pharmacist 11/04/2023,8:57 AM

## 2023-11-04 NOTE — Progress Notes (Signed)
  Progress Note  Patient Name: Marcus Hart Date of Encounter: 11/04/2023 [facility] Cardiologist: None   Interval Summary   Patient admitted by overnight cardiologist after presenting with accelerating chest pain radiating to neck and right arm.   Vital Signs Vitals:   11/04/23 0227 11/04/23 0300 11/04/23 0500 11/04/23 0600  BP: 104/68 98/60 93/63  99/71  Pulse: 71 64 66 80  Resp: 20 17  (!) 21  Temp: 98.3 F (36.8 C)     TempSrc:      SpO2: 100% 100% 100% 100%  Weight:      Height:       No intake or output data in the 24 hours ending 11/04/23 0640    11/03/2023   11:25 PM 10/01/2023    8:07 AM 06/18/2023    9:47 AM  Last 3 Weights  Weight (lbs) 171 lb 4.8 oz 171 lb 6.4 oz 176 lb 6.4 oz  Weight (kg) 77.7 kg 77.747 kg 80.015 kg      Telemetry/ECG  ECG noted with non-specific TWI in leads II, III, aVF. Also has ST depressions in V3-V6.  - Personally Reviewed  Physical Exam  GEN: No acute distress.   Neck: No JVD Cardiac: RRR, no murmurs, rubs, or gallops.  3+ right radial pulse. Respiratory: Clear to auscultation bilaterally. GI: Soft, nontender, non-distended  MS: No edema  Assessment & Plan   NSTEMI Patient with risk factors for CAD including diabetes, hyperlipidemia now admitted after presenting with accelerating chest discomfort. Troponin 383->412. ECG noted with non-specific TWI in leads II, III, aVF. Also has ST depressions in V3-V6.  Added to schedule for LHC today Continue ASA 81mg  (load completed) Continue Heparin infusion pending LHC Patient's home Crestor  has been increased to 40mg  daily Echocardiogram ordered  Hyperlipidemia Updated cholesterol panel as below.  Lab Results  Component Value Date   CHOL 157 11/04/2023   HDL 60 11/04/2023   LDLCALC 84 11/04/2023   TRIG 64 11/04/2023   CHOLHDL 2.6 11/04/2023   Continue Crestor  40mg  (increased from home dose)  Type II DM PTA meds include Metformin  1000mg  BID and Trulicity  3mg  weekly.  Last A1c 8%. SSI while admitted Consider SGLT2  Thrombocytopenia Patient's initial platelet count borderline low at 147. Repeat CBC shows platelets normal at 154.  Elevated AST Admission CMET with AST 49. On repeat 42.  Not an inpatient concern at this time. Will need repeat LFTs in 6-8 weeks with statin increase and can re-evaluate AST level at that time.          For questions or updates, please contact [facility] Please consult [email or web address] for contact info under       Signed, Leala Prince, PA-C   Agree with above.  Patient consented for cardiac catheterization.  Please see my other note the same date.  Myrlene Riera 11/04/2023 7:59 AM

## 2023-11-04 NOTE — Progress Notes (Signed)
 PHARMACY - ANTICOAGULATION CONSULT NOTE  Pharmacy Consult for heparin Indication: chest pain/ACS  No Known Allergies  Patient Measurements: Height: 5' 10.75 (179.7 cm) Weight: 77.7 kg (171 lb 4.8 oz) (Wt from 10/01/2023) IBW/kg (Calculated) : 74.73 HEPARIN DW (KG): 77.7  Vital Signs: Temp: 99 F (37.2 C) (06/19 2003) Temp Source: Oral (06/19 2003) BP: 110/63 (06/19 2003) Pulse Rate: 76 (06/19 2003)  Labs: Recent Labs    11/03/23 2240 11/04/23 0356 11/04/23 0816  HGB 12.4* 12.6*  --   HCT 37.0* 38.5*  --   PLT 147* 154  --   HEPARINUNFRC  --   --  0.30  CREATININE 0.93 0.85  --     Estimated Creatinine Clearance: 101.3 mL/min (by C-G formula based on SCr of 0.85 mg/dL).   Medical History: Past Medical History:  Diagnosis Date   Diabetes mellitus (HCC)    Diabetes mellitus without complication (HCC)    Phreesia 11/10/2019   Hyperlipidemia    Assessment: 39 yoM presented to ED with chest pain found to have NSTEMI. Troponin elevated with plans for cardiac cath. No anticoagulation prior to admission. Pharmacy consulted to dose heparin for ACS.  Heparin to resume 2h after TR band removal (~1900).   Goal of Therapy:  Heparin level 0.3-0.7 units/ml Monitor platelets by anticoagulation protocol: Yes   Plan:  Resume heparin 900 units/h no bolus at 2100 Check heparin level in 8h  Levin Reamer, PharmD, [place], [facility] Clinical Pharmacist [phone] Please check AMION for all [facility] Pharmacy numbers 11/04/2023

## 2023-11-04 NOTE — Progress Notes (Signed)
 Cardiology Attending Note: Reviewed H&P of Dr Vira Grieves from this morning.  Patient's wife at bedside.  Agree with his assessment, plan, and recommendation for cardiac catheterization. Discussed with patient again. Reviewed plan for cardiac catheterization and possible PCI to evaluate ACS in setting of background medical problems of diabetes and hyperlipidemia.   I have reviewed the risks, indications, and alternatives to cardiac catheterization, possible angioplasty, and stenting with the patient. Risks include but are not limited to bleeding, infection, vascular injury, stroke, myocardial infection, arrhythmia, kidney injury, radiation-related injury in the case of prolonged fluoroscopy use, emergency cardiac surgery, and death. The patient understands the risks of serious complication is 1-2 in 1000 with diagnostic cardiac cath and 1-2% or less with angioplasty/stenting.    Marcus Hart 11/04/2023

## 2023-11-04 NOTE — ED Notes (Signed)
Called [facility] to transport patient to [facility]--[facility] accepting

## 2023-11-04 NOTE — ED Provider Notes (Addendum)
  Physical Exam  BP 98/60   Pulse 64   Temp 98.3 F (36.8 C)   Resp 17   Ht 5' 10.75 (1.797 m)   Wt 77.7 kg Comment: Wt from 10/01/2023  SpO2 100%   BMI 24.06 kg/m   Physical Exam Vitals and nursing note reviewed.  Constitutional:      General: He is not in acute distress.    Appearance: He is well-developed. He is not diaphoretic.  HENT:     Head: Normocephalic and atraumatic.   Cardiovascular:     Rate and Rhythm: Normal rate and regular rhythm.     Heart sounds: No murmur heard.    No friction rub.  Pulmonary:     Effort: Pulmonary effort is normal. No respiratory distress.     Breath sounds: Normal breath sounds. No wheezing or rales.  Abdominal:     General: Bowel sounds are normal. There is no distension.     Palpations: Abdomen is soft.     Tenderness: There is no abdominal tenderness.   Musculoskeletal:        General: Normal range of motion.     Cervical back: Normal range of motion and neck supple.   Skin:    General: Skin is warm and dry.   Neurological:     Mental Status: He is alert and oriented to person, place, and time.     Coordination: Coordination normal.     Procedures  Procedures  ED Course / MDM    Medical Decision Making Amount and/or Complexity of Data Reviewed Labs: ordered. Radiology: ordered.  Risk OTC drugs. Prescription drug management. Decision regarding hospitalization.   Care assumed from Dr. Freddi at shift change.  Patient awaiting results of laboratory studies.  Initial EKG showing inferior lateral T wave abnormalities.  Troponin has returned at 383.  Patient started on a heparin  drip and given nitroglycerin  paste.  Care discussed with the cardiology fellow who agrees to to admit.  Patient is having some ongoing discomfort and I would prefer him wait for the bed at the main facility.  I have spoken with Dr. Trine who is on duty and agrees to accept as an ER to ER transfer to await bed and undergo cardiology  consultation.   CRITICAL CARE Performed by: Vicenta Able Total critical care time: 35 minutes Critical care time was exclusive of separately billable procedures and treating other patients. Critical care was necessary to treat or prevent imminent or life-threatening deterioration. Critical care was time spent personally by me on the following activities: development of treatment plan with patient and/or surrogate as well as nursing, discussions with consultants, evaluation of patient's response to treatment, examination of patient, obtaining history from patient or surrogate, ordering and performing treatments and interventions, ordering and review of laboratory studies, ordering and review of radiographic studies, pulse oximetry and re-evaluation of patient's condition.     Able Vicenta, MD 11/04/23 [id number]    Able Vicenta, MD 11/22/23 [phone]

## 2023-11-04 NOTE — Interval H&P Note (Signed)
 History and Physical Interval Note:  11/04/2023 4:01 PM  Marcus Hart  has presented today for surgery, with the diagnosis of nstemi.  The various methods of treatment have been discussed with the patient and family. After consideration of risks, benefits and other options for treatment, the patient has consented to  Procedure(s): LEFT HEART CATH AND CORONARY ANGIOGRAPHY (N/A) as a surgical intervention.  The patient's history has been reviewed, patient examined, no change in status, stable for surgery.  I have reviewed the patient's chart and labs.  Questions were answered to the patient's satisfaction.     Rumor Sun J Joyice Magda

## 2023-11-04 NOTE — H&P (Addendum)
 Cardiology Admission History and Physical   Patient ID: Marcus Hart MRN: [id number]; DOB: 1965/06/16   Admission date: 11/03/2023  PCP:  Benjiman Bras, MD   [facility] Providers Cardiologist:  None       Chief Complaint: Chest pain  Patient Profile: Marcus Hart is a 58 y.o. male with DM2 and HLD who is being seen 11/04/2023 for the evaluation of NSTEMI.  History of Present Illness: Marcus Hart reports that 4 days ago he developed intermittent chest pains.  The pains would occur both with exertion and at rest, but will resolve after a few minutes.  Yesterday however, the chest pain became constant prompting him to seek medical attention.  He describes the chest pain as feeling like indigestion in the center of his chest.  The pain radiates to both his neck and right arm and is associated with him feeling chills.  He denies SOB, presyncope, syncope, palpitations, swelling, PND, orthopnea, GI symptoms, or bleeding.  He has never had this happen to him before.  He lives in [place] with his wife and works as a Museum/gallery exhibitions officer.  He denies tobacco, alcohol, and illicit drug use.  His mother had CHF and a pacemaker.  He has an aunt who has CAD.  He takes all of his diabetes medications as prescribed.  [facility] vital signs all WNL.  Labs notable for sodium 134, glucose 221, creatinine 0.93, AST 49, hemoglobin 12.4, platelets 147, and troponins 383 -> 412.  CXR showed no active cardiopulmonary disease.  ECG showed T wave inversions in leads II, III, and aVF and subtle ST depressions in the left precordial leads.  He was loaded with aspirin and started on heparin and transferred to [facility] for ongoing care.  On my examination the patient is currently chest pain-free with Nitropaste.  He is accompanied by his wife at the bedside.  He has no further complaints.   Past Medical History:  Diagnosis Date   Diabetes mellitus (HCC)     Diabetes mellitus without complication (HCC)    Phreesia 11/10/2019   Hyperlipidemia    Past Surgical History:  Procedure Laterality Date   COLONOSCOPY     NO PAST SURGERIES     UPPER GASTROINTESTINAL ENDOSCOPY       Medications Prior to Admission: Prior to Admission medications   Medication Sig Start Date End Date Taking? Authorizing Provider  aspirin EC 81 MG tablet Take 81 mg by mouth daily. Swallow whole.   Yes [provider]  Dulaglutide  (TRULICITY ) 3 MG/0.5ML SOAJ Inject 3 mg into the skin once a week. 10/08/23  Yes Benjiman Bras, MD  metFORMIN  (GLUCOPHAGE ) 1000 MG tablet Take 1 tablet (1,000 mg total) by mouth 2 (two) times daily with a meal. 05/24/23  Yes Benjiman Bras, MD  rosuvastatin  (CRESTOR ) 10 MG tablet Take 1 tablet (10 mg total) by mouth daily. 10/01/23  Yes Benjiman Bras, MD  Insulin Pen Needle (PEN NEEDLES) 32G X 4 MM MISC Use with Victoza  to inject once daily 06/30/21   Benjiman Bras, MD  Lancets [facility] DELICA PLUS [place]) MISC USE AS DIRECTED TO TEST BLOOD GLUCOSE 11/03/21   Benjiman Bras, MD  [facility] VERIO test strip USE AS DIRECTED 05/31/23   Benjiman Bras, MD     Allergies:   No Known Allergies  Social History:   Social History   Socioeconomic History   Marital status: Married    Spouse name:  [name]   Number of children: 4   Years of education: Not on file   Highest education level: Bachelor's degree (e.g., BA, AB, BS)  Occupational History   Occupation: Designer, television/film set  Tobacco Use   Smoking status: Never   Smokeless tobacco: Never  Vaping Use   Vaping status: Never Used  Substance and Sexual Activity   Alcohol use: Never    Alcohol/week: 0.0 standard drinks of alcohol   Drug use: No   Sexual activity: Yes  Other Topics Concern   Not on file  Social History Narrative   Married   Education: Automotive engineer   Exercise: No   Social Drivers of Corporate investment banker Strain: Medium Risk (06/18/2023)   Overall Financial  Resource Strain (CARDIA)    Difficulty of Paying Living Expenses: Somewhat hard  Food Insecurity: Food Insecurity Present (06/18/2023)   Hunger Vital Sign    Worried About Running Out of Food in the Last Year: Sometimes true    Ran Out of Food in the Last Year: Never true  Transportation Needs: No Transportation Needs (06/18/2023)   PRAPARE - Administrator, Civil Service (Medical): No    Lack of Transportation (Non-Medical): No  Physical Activity: Insufficiently Active (06/18/2023)   Exercise Vital Sign    Days of Exercise per Week: 1 day    Minutes of Exercise per Session: 30 min  Stress: No Stress Concern Present (06/18/2023)   [employer] of Occupational Health - Occupational Stress Questionnaire    Feeling of Stress : Not at all  Social Connections: Moderately Integrated (06/18/2023)   Social Connection and Isolation Panel    Frequency of Communication with Friends and Family: More than three times a week    Frequency of Social Gatherings with Friends and Family: Once a week    Attends Religious Services: More than 4 times per year    Active Member of [employer] or Organizations: No    Attends Engineer, structural: Not on file    Marital Status: Married  Catering manager Violence: Not on file     Family History:   The patient's family history includes Benign prostatic hyperplasia in his father; Bladder Cancer in his mother; Diabetes in his mother; Hypertension in his mother; Ulcers in his father. There is no history of Colon cancer, Colon polyps, Esophageal cancer, Rectal cancer, or Stomach cancer.    ROS:  Please see the history of present illness.  All other ROS reviewed and negative.     Physical Exam/Data: Vitals:   11/03/23 2325 11/04/23 0100 11/04/23 0150 11/04/23 0227  BP:  105/71 98/66 104/68  Pulse:  64 76 71  Resp:  17 (!) 21 20  Temp:  98.6 F (37 C)  98.3 F (36.8 C)  TempSrc:  Oral    SpO2:  100% 100% 100%  Weight: 77.7 kg     Height:  5' 10.75 (1.797 m)      No intake or output data in the 24 hours ending 11/04/23 0342    11/03/2023   11:25 PM 10/01/2023    8:07 AM 06/18/2023    9:47 AM  Last 3 Weights  Weight (lbs) 171 lb 4.8 oz 171 lb 6.4 oz 176 lb 6.4 oz  Weight (kg) 77.7 kg 77.747 kg 80.015 kg     Body mass index is 24.06 kg/m.  General:  Well nourished, well developed, in no acute distress, very pleasant HEENT: normal, atraumatic, normocephalic Neck: no JVD Vascular: No carotid bruits;  radial pulses 2+ bilaterally   Cardiac:  normal S1, S2; RRR; faint I/VI systolic murmur at the cardiac apex, no rubs or gallops Lungs:  clear to auscultation bilaterally, no wheezing, rhonchi or rales  Abd: soft, nontender, nondistended Ext: no edema Musculoskeletal:  No deformities, BUE and BLE strength grossly normal and equal Skin: warm and dry  Neuro:  CNs 2-12 intact, no focal abnormalities noted Psych:  Normal affect   EKG:  The ECG that was done was personally reviewed and demonstrates NSR with TWIs in II, III, aVF and ST depressions in V3-V6.    Relevant CV Studies: N/A  Laboratory Data: High Sensitivity Troponin:  No results for input(s): TROPONINIHS in the last 720 hours.    Chemistry Recent Labs  Lab 11/03/23 2240  NA 134*  K 4.4  CL 99  CO2 25  GLUCOSE 221*  BUN 13  CREATININE 0.93  CALCIUM  9.2  GFRNONAA >60  ANIONGAP 10    Recent Labs  Lab 11/03/23 2240  PROT 6.6  ALBUMIN 4.0  AST 49*  ALT 18  ALKPHOS 62  BILITOT 0.5   Lipids No results for input(s): CHOL, TRIG, HDL, LABVLDL, LDLCALC, CHOLHDL in the last 168 hours. Hematology Recent Labs  Lab 11/03/23 2240  WBC 5.1  RBC 4.35  HGB 12.4*  HCT 37.0*  MCV 85.1  MCH 28.5  MCHC 33.5  RDW 13.0  PLT 147*   Thyroid  No results for input(s): TSH, FREET4 in the last 168 hours. BNPNo results for input(s): BNP, PROBNP in the last 168 hours.  DDimer No results for input(s): DDIMER in the last 168  hours.  Radiology/Studies:  DG Chest Portable 1 View Result Date: 11/03/2023 CLINICAL DATA:  Chest pain EXAM: PORTABLE CHEST 1 VIEW COMPARISON:  11/22/2015 FINDINGS: The heart size and mediastinal contours are within normal limits. Both lungs are clear. The visualized skeletal structures are unremarkable. IMPRESSION: No active disease. Electronically Signed   By: Esmeralda Hedge M.D.   On: 11/03/2023 22:58     Assessment and Plan: Marcus Hart is a 58 y.o. male with DM2 and HLD who is being seen 11/04/2023 for the evaluation of NSTEMI.  #NSTEMI, Likely Type 1 :: Patient presents with chest pain, elevated troponins, and EKG changes all consistent with ACS. GRACE score = 111 which is lower risk.  His greatest risk factors for having ACS are uncontrolled diabetes and HLD.  He will need LHC to define his coronary anatomy and aggressive secondary preventive measures. - LHC in a.m. - N.p.o. pending procedure - s/p aspirin load - Continue aspirin 81 mg daily - Continue heparin per pharmacy - Increase home rosuvastatin  to 40 mg daily - Start metoprolol tartrate 12.5 mg twice daily pending EF assessment - Complete echo - Check lipid panel, LPA, TSH, A1c - His LDL goal should be <70 at a minimum - Active type and screen  #HLD :: Patient with type 2 diabetes his LDL goal is <70.  His most recent LDL prior to admission was 116.  I will increase his rosuvastatin  to maximum dose 40 mg daily.  Will also recheck a lipid panel. - Increase rosuvastatin  and check lipid profile as described above  #DMII :: Unfortunately his diabetes is not adequately controlled based on his previous A1c of 8%.  He is currently on metformin  1000 mg twice daily and Trulicity  3 mg weekly.  Depending on his cath results he may benefit from the addition of SGLT2. - Hold home diabetes medications - Start SSI  q6h while NPO - Consider adding SGLT2  #Mild Thrombocytopenia #Mild AST Elevation :: His platelet count was noted to  be 147 and AST 46 on admission.  He does not have baseline thrombocytopenia or transaminitis per my chart review.  I will recheck these labs to see if they were spurious.  If they are indeed mildly abnormal however, this could be a marker of underlying liver pathology such as MASH, which would warrant a liver ultrasound.  Will recheck for now. - Recheck CMP and CBC - Check HIV   Risk Assessment/Risk Scores:   TIMI Risk Score for Unstable Angina or Non-ST Elevation MI:   The patient's TIMI risk score is 3, which indicates a 13% risk of all cause mortality, new or recurrent myocardial infarction or need for urgent revascularization in the next 14 days.     Code Status: Full Code  Severity of Illness: The appropriate patient status for this patient is INPATIENT. Inpatient status is judged to be reasonable and necessary in order to provide the required intensity of service to ensure the patient's safety. The patient's presenting symptoms, physical exam findings, and initial radiographic and laboratory data in the context of their chronic comorbidities is felt to place them at high risk for further clinical deterioration. Furthermore, it is not anticipated that the patient will be medically stable for discharge from the hospital within 2 midnights of admission.   * I certify that at the point of admission it is my clinical judgment that the patient will require inpatient hospital care spanning beyond 2 midnights from the point of admission due to high intensity of service, high risk for further deterioration and high frequency of surveillance required.*  For questions or updates, please contact [facility] Please consult [email or web address] for contact info under     Signed, Christena Covert, MD  11/04/2023 3:42 AM

## 2023-11-05 ENCOUNTER — Telehealth (HOSPITAL_COMMUNITY): Payer: Self-pay | Admitting: Pharmacy Technician

## 2023-11-05 ENCOUNTER — Other Ambulatory Visit (HOSPITAL_COMMUNITY): Payer: Self-pay

## 2023-11-05 DIAGNOSIS — I214 Non-ST elevation (NSTEMI) myocardial infarction: Secondary | ICD-10-CM | POA: Diagnosis not present

## 2023-11-05 DIAGNOSIS — I25110 Atherosclerotic heart disease of native coronary artery with unstable angina pectoris: Secondary | ICD-10-CM | POA: Diagnosis not present

## 2023-11-05 LAB — CBC
HCT: 39 % (ref 39.0–52.0)
Hemoglobin: 12.9 g/dL — ABNORMAL LOW (ref 13.0–17.0)
MCH: 28.6 pg (ref 26.0–34.0)
MCHC: 33.1 g/dL (ref 30.0–36.0)
MCV: 86.5 fL (ref 80.0–100.0)
Platelets: 171 10*3/uL (ref 150–400)
RBC: 4.51 MIL/uL (ref 4.22–5.81)
RDW: 13 % (ref 11.5–15.5)
WBC: 3.4 10*3/uL — ABNORMAL LOW (ref 4.0–10.5)
nRBC: 0 % (ref 0.0–0.2)

## 2023-11-05 LAB — GLUCOSE, CAPILLARY
Glucose-Capillary: 134 mg/dL — ABNORMAL HIGH (ref 70–99)
Glucose-Capillary: 184 mg/dL — ABNORMAL HIGH (ref 70–99)
Glucose-Capillary: 158 mg/dL — ABNORMAL HIGH (ref 70–99)
Glucose-Capillary: 129 mg/dL — ABNORMAL HIGH (ref 70–99)

## 2023-11-05 LAB — HEPARIN LEVEL (UNFRACTIONATED)
Heparin Unfractionated: 0.11 [IU]/mL — ABNORMAL LOW (ref 0.30–0.70)
Heparin Unfractionated: 0.32 [IU]/mL (ref 0.30–0.70)

## 2023-11-05 MED ORDER — CEFAZOLIN SODIUM-DEXTROSE 2-4 GM/100ML-% IV SOLN
2.0000 g | INTRAVENOUS | Status: DC
  Filled 2023-11-05: qty 100

## 2023-11-05 MED ORDER — POTASSIUM CHLORIDE 2 MEQ/ML IV SOLN
80.0000 meq | INTRAVENOUS | Status: DC
  Filled 2023-11-05: qty 40

## 2023-11-05 MED ORDER — INSULIN REGULAR(HUMAN) IN NACL 100-0.9 UT/100ML-% IV SOLN
INTRAVENOUS | Status: AC
  Administered 2023-11-08: 3.2 [IU]/h via INTRAVENOUS
  Filled 2023-11-05: qty 100

## 2023-11-05 MED ORDER — CEFAZOLIN SODIUM-DEXTROSE 2-4 GM/100ML-% IV SOLN
2.0000 g | INTRAVENOUS | Status: AC
  Administered 2023-11-08 (×2): 2 g via INTRAVENOUS
  Filled 2023-11-05: qty 100

## 2023-11-05 MED ORDER — NITROGLYCERIN IN D5W 200-5 MCG/ML-% IV SOLN
2.0000 ug/min | INTRAVENOUS | Status: AC
  Administered 2023-11-08: 10 ug/min via INTRAVENOUS
  Filled 2023-11-05: qty 250

## 2023-11-05 MED ORDER — MAGNESIUM SULFATE 50 % IJ SOLN
40.0000 meq | INTRAMUSCULAR | Status: DC
  Filled 2023-11-05: qty 9.85

## 2023-11-05 MED ORDER — PHENYLEPHRINE HCL-NACL 20-0.9 MG/250ML-% IV SOLN
30.0000 ug/min | INTRAVENOUS | Status: DC
  Filled 2023-11-05: qty 250

## 2023-11-05 MED ORDER — TRANEXAMIC ACID 1000 MG/10ML IV SOLN
1.5000 mg/kg/h | INTRAVENOUS | Status: AC
  Administered 2023-11-08: 1.5 mg/kg/h via INTRAVENOUS
  Filled 2023-11-05: qty 25

## 2023-11-05 MED ORDER — PLASMA-LYTE A IV SOLN
INTRAVENOUS | Status: DC
  Filled 2023-11-05: qty 2.5

## 2023-11-05 MED ORDER — VANCOMYCIN HCL 1250 MG/250ML IV SOLN
1250.0000 mg | INTRAVENOUS | Status: AC
  Administered 2023-11-08: 1250 mg via INTRAVENOUS
  Filled 2023-11-05: qty 250

## 2023-11-05 MED ORDER — MILRINONE LACTATE IN DEXTROSE 20-5 MG/100ML-% IV SOLN
0.3000 ug/kg/min | INTRAVENOUS | Status: DC
  Filled 2023-11-05: qty 100

## 2023-11-05 MED ORDER — TRANEXAMIC ACID (OHS) PUMP PRIME SOLUTION
2.0000 mg/kg | INTRAVENOUS | Status: DC
  Filled 2023-11-05: qty 1.5

## 2023-11-05 MED ORDER — INSULIN ASPART 100 UNIT/ML IJ SOLN
0.0000 [IU] | Freq: Three times a day (TID) | INTRAMUSCULAR | Status: DC
  Administered 2023-11-05 – 2023-11-07 (×7): 3 [IU] via SUBCUTANEOUS
  Administered 2023-11-07: 5 [IU] via SUBCUTANEOUS
  Administered 2023-11-08: 2 [IU] via SUBCUTANEOUS

## 2023-11-05 MED ORDER — EPINEPHRINE HCL 5 MG/250ML IV SOLN IN NS
0.0000 ug/min | INTRAVENOUS | Status: DC
  Filled 2023-11-05: qty 250

## 2023-11-05 MED ORDER — NOREPINEPHRINE 4 MG/250ML-% IV SOLN
0.0000 ug/min | INTRAVENOUS | Status: DC
  Filled 2023-11-05: qty 250

## 2023-11-05 MED ORDER — DEXMEDETOMIDINE HCL IN NACL 400 MCG/100ML IV SOLN
0.1000 ug/kg/h | INTRAVENOUS | Status: AC
  Administered 2023-11-08: .3 ug/kg/h via INTRAVENOUS
  Filled 2023-11-05: qty 100

## 2023-11-05 MED ORDER — TRANEXAMIC ACID (OHS) BOLUS VIA INFUSION
15.0000 mg/kg | INTRAVENOUS | Status: AC
  Administered 2023-11-08: 1123.5 mg via INTRAVENOUS
  Filled 2023-11-05: qty 1124

## 2023-11-05 MED ORDER — HEPARIN 30,000 UNITS/1000 ML (OHS) CELLSAVER SOLUTION
Status: DC
  Filled 2023-11-05: qty 1000

## 2023-11-05 MED ORDER — INSULIN ASPART 100 UNIT/ML IJ SOLN: 0.0000 [IU] | Freq: Every day | INTRAMUSCULAR | Status: DC

## 2023-11-05 NOTE — TOC CM/SW Note (Signed)
 Transition of Care [facility]) - Inpatient Brief Assessment   Patient Details  Name: Torrence Hammack MRN: [id number] Date of Birth: [birth year]  Transition of Care [facility]) CM/SW Contact:    Jennett Model, RN Phone Number: 11/05/2023, 3:35 PM   Clinical Narrative: From home with spouse, has PCP and insurance on file, states has no HH services in place at this time or DME at home.  States family member will transport them home at [employer] and family is support system, states gets medications from [facility].  Pta self ambulatory .     Transition of Care Asessment: Insurance and Status: Insurance coverage has been reviewed Patient has primary care physician: Yes Home environment has been reviewed: home with wife Prior level of function:: indep Prior/Current Home Services: No current home services Social Drivers of Health Review: SDOH reviewed no interventions necessary Readmission risk has been reviewed: Yes Transition of care needs: no transition of care needs at this time

## 2023-11-05 NOTE — H&P (View-Only) (Signed)
 [address]       [place] [address]             [phone]        [facility] Medical Record #[id number] Date of Birth: 1965-10-06  Referring: Dr. Filiberto Hug, MD Primary Care: Benjiman Bras, MD Primary Cardiologist:None  Chief Complaint:    Chief Complaint  Patient presents with   Chest Pain  Reason for consultation: Coronary artery disease  History of Present Illness:     This is a 58 year old [place] male with a past medical history of hyperlipidemia and diabetes mellitus who presented to [facility] on 11/04/2023 in the early am with complaints of intermittent chest pain, that occurred with both exertion and at rest. Typically, chest pain would resolve after a few minutes;however on 11/04/2023 the chest pain became more constant. The pain radiates to his neck and right arm and he feels as though he has chills. The pain has not stopped him from doing things. Per wife, he was mowing lawn and cutting trees one week ago. He denied shortness of breath, nausea, abdominal pain, or syncope. Chest x ray showed no active disease. EKG showed T wave inversions in leads II, III, and aVF and subtle ST depressions in the precordial leads. Initial Troponin I (high sensitivity) was 383 and max's at 412. He ruled in for a NSTEMI. He was started on Heparin and given aspirin. He was transported to [facility] for further evaluation and treatment.  He had an echocardiogram done 11/04/2023 which showed LVEF 55-60%, LV has no regional wall abnormalities, and no significant valvular abnormalities. He then underwent a cardiac catheterization and was found to have an ostial LAD lesion with a 70% stenosis, proximal to mid LAD with aa 50% stenosis (IVUS determined lesion is calcified), mid to distal Circumflex with a 99% stenosis, and a RV branch with a 90% stenosis. Cardiothoracic consultation was requested for consideration of coronary artery bypass  grafting surgery. At the time of my exam, patient denies chest pain.  Patient is married (wife at bedside), has 6 children, and works as a Museum/gallery exhibitions officer. He is very active.  Current Activity/ Functional Status: Patient is independent with mobility/ambulation, transfers, ADL's, IADL's.   Zubrod Score: At the time of surgery this patient's most appropriate activity status/level should be described as: []     0    Normal activity, no symptoms [x]     1    Restricted in physical strenuous activity but ambulatory, able to do out light work []     2    Ambulatory and capable of self care, unable to do work activities, up and about more than 50%  Of the time                            []     3    Only limited self care, in bed greater than 50% of waking hours []     4    Completely disabled, no self care, confined to bed or chair []     5    Moribund  Past Medical History:  Diagnosis Date   Diabetes mellitus (HCC)    Diabetes mellitus without complication (HCC)    Phreesia 11/10/2019   Hyperlipidemia     Past Surgical History:  Procedure Laterality Date   COLONOSCOPY     CORONARY PRESSURE/FFR STUDY N/A 11/04/2023  Procedure: CORONARY PRESSURE/FFR STUDY;  Surgeon: Cody Das, MD;  Location: [facility] INVASIVE CV LAB;  Service: Cardiovascular;  Laterality: N/A;   CORONARY ULTRASOUND/IVUS N/A 11/04/2023   Procedure: Coronary Ultrasound/IVUS;  Surgeon: Cody Das, MD;  Location: [facility] INVASIVE CV LAB;  Service: Cardiovascular;  Laterality: N/A;   LEFT HEART CATH AND CORONARY ANGIOGRAPHY N/A 11/04/2023   Procedure: LEFT HEART CATH AND CORONARY ANGIOGRAPHY;  Surgeon: Cody Das, MD;  Location: [facility] INVASIVE CV LAB;  Service: Cardiovascular;  Laterality: N/A;   NO PAST SURGERIES     UPPER GASTROINTESTINAL ENDOSCOPY      Social History   Tobacco Use  Smoking Status Never  Smokeless Tobacco Never    Social History   Substance and Sexual Activity  Alcohol Use Never    Alcohol/week: 0.0 standard drinks of alcohol    Allergies: No Known Allergies  Current Facility-Administered Medications  Medication Dose Route Frequency Provider Last Rate Last Admin   acetaminophen  (TYLENOL ) tablet 650 mg  650 mg Oral Q4H PRN Christena Covert, MD       aspirin EC tablet 81 mg  81 mg Oral Daily Christena Covert, MD   81 mg at 11/05/23 0957   heparin ADULT infusion 100 units/mL (25000 units/250mL)  900 Units/hr Intravenous Continuous Sheron Dixons, RPH 9 mL/hr at 11/04/23 2121 900 Units/hr at 11/04/23 2121   insulin aspart (novoLOG) injection 0-15 Units  0-15 Units Subcutaneous TID WC Williams, Evan, PA-C       insulin aspart (novoLOG) injection 0-5 Units  0-5 Units Subcutaneous QHS Williams, Evan, PA-C       metoprolol tartrate (LOPRESSOR) tablet 12.5 mg  12.5 mg Oral BID Christena Covert, MD   12.5 mg at 11/05/23 0959   nitroGLYCERIN 50 mg in dextrose 5 % 250 mL (0.2 mg/mL) infusion  0-200 mcg/min Intravenous Titrated Cody Das, MD   Stopped at 11/05/23 0950   ondansetron (ZOFRAN) injection 4 mg  4 mg Intravenous Q6H PRN Christena Covert, MD       rosuvastatin  (CRESTOR ) tablet 40 mg  40 mg Oral Daily Sullivan, Lonnie, MD   40 mg at 11/05/23 0957    Facility-Administered Medications Prior to Admission  Medication Dose Route Frequency Provider Last Rate Last Admin   0.9 %  sodium chloride  infusion  500 mL Intravenous Once Danis, Henry L III, MD       Medications Prior to Admission  Medication Sig Dispense Refill Last Dose/Taking   aspirin EC 81 MG tablet Take 81 mg by mouth daily. Swallow whole.   11/02/2023   Dulaglutide  (TRULICITY ) 3 MG/0.5ML SOAJ Inject 3 mg into the skin once a week. 6 mL 1 10/22/2023   metFORMIN  (GLUCOPHAGE ) 1000 MG tablet Take 1 tablet (1,000 mg total) by mouth 2 (two) times daily with a meal. 180 tablet 1 11/03/2023 Morning   rosuvastatin  (CRESTOR ) 10 MG tablet Take 1 tablet (10 mg total) by mouth daily. 90 tablet 1 11/03/2023 Morning    Insulin Pen Needle (PEN NEEDLES) 32G X 4 MM MISC Use with Victoza  to inject once daily 90 each 3    Lancets (ONETOUCH DELICA PLUS LANCET33G) MISC USE AS DIRECTED TO TEST BLOOD GLUCOSE 100 each 1    ONETOUCH VERIO test strip USE AS DIRECTED 100 strip 0     Family History  Problem Relation Age of Onset   Diabetes Mother    Bladder Cancer Mother    Hypertension Mother    Ulcers Father    Benign prostatic  hyperplasia Father    Colon cancer Neg Hx    Colon polyps Neg Hx    Esophageal cancer Neg Hx    Rectal cancer Neg Hx    Stomach cancer Neg Hx   Mother had a pacemaker, aunt had coronary artery disease and had CABG   Review of Systems:    Cardiac Review of Systems: Y or  [  N  ]= no  Chest Pain [   Y-on admission ] Syncope  Discordia.Diesel  ]     General Review of Systems: [Y] = yes [  N]=no Constitional:fatigue [  Y]; nausea [ N ];  fever [N  ];                                                           Dental: Last Dentist visit: Last year;has another appointment soon  Resp: cough [  N];  wheezing[  ];  hemoptysis[ N ]; shortness of breath[ N ];  GI:  vomiting[ N ];  dysphagia[N  ]; melena[ N ];  hematochezia [ N ];  ZO:[id number][ N ];                Skin: rash, swelling[  N];, peripheral edema[ N ];   Musculosketetal: joint erythema[ N ];  joint pain[ N ];    Heme/Lymph:  anemia[Y  ];  Neuro:  stroke[ N ];  difficulty walking[  N];  Endocrine: diabetes[ Y ];  thyroid  dysfunction[ N ];               Physical Exam: BP 104/66   Pulse 65   Temp 99.8 F (37.7 C) (Oral) Comment: pt just drank some coffee.  Resp 20   Ht 5' 10.75 (1.797 m)   Wt 74.9 kg   SpO2 99%   BMI 23.19 kg/m    General appearance: alert, cooperative, and no distress Head: Normocephalic, without obvious abnormality, atraumatic Neck: no carotid bruit, no JVD, and supple, symmetrical, trachea midline Resp: clear to auscultation bilaterally Cardio: RRR, no murmur or rub GI: Soft, non tender, bowel sounds  present Extremities: No LE edema. Palpable DP/PT bilaterally Neurologic: Grossly normal  Diagnostic Studies & Laboratory data:     Recent Radiology Findings:   CARDIAC CATHETERIZATION Result Date: 11/04/2023 Images from the original result were not included. Coronary angiography 11/04/2023: LM: Essentially non existent with separate dual ostia to LAD and LCx LAD: Medium caliber vessel          Severe dampening with 6 [place] EBU 3.0 guide engagement          Ostial 70% stenosis, CSA 5.1 mm2          Another lesion under appreciated angiographically, but noted on IVUS about 26 mm from the ostium with severe 180 deg calcification and CSA 3.2 mm2          RFR in mid LAD 0.83. Significant gradient was in prox-mid LAD, not in ostial LAD LCx: Large dominant vessel         Mid to distal 99% stenosis (likely culprit) with TIMI I flow distally RCA: Nondominant           RV marginal ostial 90% stenosis LVEDP 14 mmHg Challenging decision making re: revascularization given concern for severe LAD disease, in addition to likely culprit lesion  in large dominant LCx, in a 58 y/o diabetic patient with NSTEMI and normal LVEF. He is currently chest pain free. Recommend Aspirin, IV heparin, IV nitroglycerin. In case there is any acute chest pain overnight with EKG changes concerning for posterior injury, we could consider urgent revascularization to Lcx. If patient stays stable, I will discuss the case in multidisciplinary heart team meeting tomorrow morning if he could benefit from CABG better. Cody Das, MD   Diagnostic Dominance: Left  Intervention  ECHOCARDIOGRAM COMPLETE Result Date: 11/04/2023    ECHOCARDIOGRAM REPORT   Patient Name:   DMANI MIZER Date of Exam: 11/04/2023 Medical Rec #:  [id number]      Height:       70.7 in Accession #:    [id number]     Weight:       171.3 lb Date of Birth:  [birth year]       BSA:          1.969 m Patient Age:    58 years       BP:           96/63 mmHg Patient Gender: M               HR:           63 bpm. Exam Location:  Inpatient Procedure: 2D Echo, Cardiac Doppler, Color Doppler and Saline Contrast Bubble            Study (Both Spectral and Color Flow Doppler were utilized during            procedure). Indications:    NSTEMI  History:        Patient has prior history of Echocardiogram examinations and                 Patient has no prior history of Echocardiogram examinations.  Sonographer:    Griselda Lederer Referring Phys: [id number] Christena Covert IMPRESSIONS  1. Left ventricular ejection fraction, by estimation, is 55 to 60%. The left ventricle has normal function. The left ventricle has no regional wall motion abnormalities. Left ventricular diastolic parameters were normal.  2. Right ventricular systolic function is normal. The right ventricular size is normal.  3. The mitral valve is normal in structure. No evidence of mitral valve regurgitation. No evidence of mitral stenosis.  4. The aortic valve is normal in structure. There is mild calcification of the aortic valve. Aortic valve regurgitation is not visualized. No aortic stenosis is present.  5. The inferior vena cava is normal in size with greater than 50% respiratory variability, suggesting right atrial pressure of 3 mmHg. FINDINGS  Left Ventricle: Left ventricular ejection fraction, by estimation, is 55 to 60%. The left ventricle has normal function. The left ventricle has no regional wall motion abnormalities. The left ventricular internal cavity size was normal in size. There is  no left ventricular hypertrophy. Left ventricular diastolic parameters were normal. Right Ventricle: The right ventricular size is normal. No increase in right ventricular wall thickness. Right ventricular systolic function is normal. Left Atrium: Left atrial size was normal in size. Right Atrium: Right atrial size was normal in size. Pericardium: There is no evidence of pericardial effusion. Mitral Valve: The mitral valve is normal in structure.  No evidence of mitral valve regurgitation. No evidence of mitral valve stenosis. Tricuspid Valve: The tricuspid valve is normal in structure. Tricuspid valve regurgitation is not demonstrated. No evidence of tricuspid stenosis. Aortic Valve: The aortic valve is normal in structure. There  is mild calcification of the aortic valve. Aortic valve regurgitation is not visualized. No aortic stenosis is present. Pulmonic Valve: The pulmonic valve was normal in structure. Pulmonic valve regurgitation is not visualized. No evidence of pulmonic stenosis. Aorta: The aortic root is normal in size and structure. Venous: The inferior vena cava is normal in size with greater than 50% respiratory variability, suggesting right atrial pressure of 3 mmHg. IAS/Shunts: No atrial level shunt detected by color flow Doppler. Agitated saline contrast was given intravenously to evaluate for intracardiac shunting.  LEFT VENTRICLE PLAX 2D LVIDd:         4.50 cm      Diastology LVIDs:         2.80 cm      LV e' medial:    6.20 cm/s LV PW:         0.80 cm      LV E/e' medial:  9.2 LV IVS:        0.70 cm      LV e' lateral:   12.30 cm/s LVOT diam:     2.20 cm      LV E/e' lateral: 4.6 LVOT Area:     3.80 cm  LV Volumes (MOD) LV vol d, MOD A2C: 105.0 ml LV vol d, MOD A4C: 77.4 ml LV vol s, MOD A2C: 44.2 ml LV vol s, MOD A4C: 40.4 ml LV SV MOD A2C:     60.8 ml LV SV MOD A4C:     77.4 ml LV SV MOD BP:      55.0 ml RIGHT VENTRICLE             IVC RV Basal diam:  3.10 cm     IVC diam: 1.00 cm RV S prime:     10.60 cm/s TAPSE (M-mode): 2.3 cm LEFT ATRIUM             Index LA diam:        2.70 cm 1.37 cm/m LA Vol (A2C):   36.3 ml 18.43 ml/m LA Vol (A4C):   30.5 ml 15.49 ml/m LA Biplane Vol: 35.2 ml 17.87 ml/m   AORTA Ao Root diam: 3.60 cm Ao Asc diam:  3.10 cm MITRAL VALVE MV Area (PHT): 3.95 cm    SHUNTS MV Decel Time: 192 msec    Systemic Diam: 2.20 cm MV E velocity: 57.10 cm/s MV A velocity: 64.50 cm/s MV E/A ratio:  0.89 Aditya Sabharwal  Electronically signed by Alwin Baars Signature Date/Time: 11/04/2023/1:12:10 PM    Final    DG Chest Portable 1 View Result Date: 11/03/2023 CLINICAL DATA:  Chest pain EXAM: PORTABLE CHEST 1 VIEW COMPARISON:  11/22/2015 FINDINGS: The heart size and mediastinal contours are within normal limits. Both lungs are clear. The visualized skeletal structures are unremarkable. IMPRESSION: No active disease. Electronically Signed   By: Esmeralda Hedge M.D.   On: 11/03/2023 22:58     I have independently reviewed the above radiologic studies and discussed with the patient   Recent Lab Findings: Lab Results  Component Value Date   WBC 3.4 (L) 11/05/2023   HGB 12.9 (L) 11/05/2023   HCT 39.0 11/05/2023   PLT 171 11/05/2023   GLUCOSE 144 (H) 11/04/2023   CHOL 157 11/04/2023   TRIG 64 11/04/2023   HDL 60 11/04/2023   LDLCALC 84 11/04/2023   ALT 19 11/04/2023   AST 42 (H) 11/04/2023   NA 137 11/04/2023   K 4.0 11/04/2023   CL 103 11/04/2023  CREATININE 0.85 11/04/2023   BUN 12 11/04/2023   CO2 26 11/04/2023   TSH 1.231 11/04/2023   HGBA1C 7.6 (H) 11/04/2023   Assessment / Plan:   S/p NSTEMI, coronary artery disease-on Heparin drip. Dr. Luna Salinas to review cardiac catheterization films and evaluate for coronary artery bypass grafting surgery (appears to likely be a candidate). Of note, if surgical candidate and considering radial artery conduit, he is right hand dominant. 2. History of hyperlipidemia-on Rosuvastatin  3. History of DM-HGA1C 7.6. On Trulicity  and Metformin  prior to admission 4. Anemia-H and H this am up to 12.9 and 39.   I  spent 25 minutes counseling the patient face to face.   Donielle Zimmerman PA-C 11/05/2023 11:46 AM   Patient seen and examined, records and cath images reviewed.    58 yo man with history of hyperlipidemia and type II diabetes presents with unstable CP and r/I for non-STEMI.  At cath has severe 2 vessel CAD in left dominant circulation.  CABG  indicated for survival benefit and relief of symptoms.  Has normal Allen's tet on left by pulse ox.  Will plan left radial in addition to LIMA.  I discussed the general nature of the procedure, including the need for general anesthesia, the incisions to be used, the use of cardiopulmonary bypass, and the use of temporary pacemaker wires and drainage tubes postoperatively with Mr and Mrs Cando.  We discussed the expected hospital stay, overall recovery and short and long term outcomes. I informed them of the indications, risks, benefits and alternatives.   He understands the risks include, but are not limited to death, stroke, MI, DVT/PE, bleeding, possible need for transfusion, infections, cardiac arrhythmias, as well as other organ system dysfunction including respiratory, renal, or GI complications.  We discussed use of left radial artery as well.  He accepts the risks and agrees to proceed.  For CABG Monday  Landon Pinion C. Luna Salinas, MD [facility] [phone]

## 2023-11-05 NOTE — Plan of Care (Signed)
°  Problem: Education: °Goal: Understanding of cardiac disease, CV risk reduction, and recovery process will improve °Outcome: Progressing °Goal: Individualized Educational Video(s) °Outcome: Progressing °  °

## 2023-11-05 NOTE — Progress Notes (Addendum)
 Progress Note  Patient Name: Marcus Hart Date of Encounter: 11/05/2023 [facility] Cardiologist: None   Interval Summary    Patient denies any chest pain since LHC yesterday. Has ambulated around the room without recurrent chest pain or dyspnea.   Vital Signs Vitals:   11/05/23 0000 11/05/23 0400 11/05/23 0700 11/05/23 0753  BP: 95/69 100/68 94/63 101/64  Pulse: 69 68 85 77  Resp: 18 18 20 19   Temp: 97.8 F (36.6 C) 99.6 F (37.6 C) 99 F (37.2 C) 98.7 F (37.1 C)  TempSrc: Oral Oral Oral Oral  SpO2: 99% 99% 98% 99%  Weight:  74.9 kg    Height:        Intake/Output Summary (Last 24 hours) at 11/05/2023 0954 Last data filed at 11/05/2023 0405 Gross per 24 hour  Intake 370.47 ml  Output --  Net 370.47 ml      11/05/2023    4:00 AM 11/03/2023   11:25 PM 10/01/2023    8:07 AM  Last 3 Weights  Weight (lbs) 165 lb 2 oz 171 lb 4.8 oz 171 lb 6.4 oz  Weight (kg) 74.9 kg 77.7 kg 77.747 kg      Telemetry/ECG  Sinus rhythm with persistent TWI - Personally Reviewed  Physical Exam  GEN: No acute distress.   Neck: No JVD Cardiac: RRR, no murmurs, rubs, or gallops.  Respiratory: Clear to auscultation bilaterally. GI: Soft, nontender, non-distended  MS: No edema. Right radial access site without hematoma or bleeding.  Assessment & Plan   NSTEMI CAD Hyperlipidemia Patient presented with chest pain, found with elevated troponin and EKG changes. Given risk factors, ischemic evaluation pursued. Patient found with multivessel disease including 70% ostial LAD stenosis, as well as 50% stenosis 26mm from ostium. LCX with mid to distal 99% stenosis, non-dominant RCA with RV marginal ostial 90% stenosis. Given these findings, CABG consult recommended. TTE with LVEF 55-60%, no RWMA. CTCS consult pending. Continue IV heparin. Wean NTG drip Continue ASA 81mg  Continue increased dose Crestor  40mg  Patient started on Lopressor 12.5mg  BID on admission. Could consider stopping  with normal EF per REDUCE-AMI trial.   DM type II A1c 7.6% this admission. On GLP1 and Metformin  at home.  Continue SSI pending possible CABG Ideally would consider SGLT2 prior to discharge.  Anemia CBC with HGB 12.6. MCV normal. On iron and TIBC panel, iron 33, saturation ratio 9. Given ferritin of 84, not clear that he needs replacement at this time.       For questions or updates, please contact [facility] Please consult [email or web address] for contact info under       Signed, Marcus Prince, PA-C   Patient seen, examined. Available data reviewed. Agree with findings, assessment, and plan as outlined by Marcus Prince, PA-C.  Patient is independently interviewed and examined.  His wife is at the bedside.  He is chest pain-free on IV heparin.  HEENT is normal, JVP is normal, lungs are clear bilaterally, heart is regular rate and rhythm no murmur gallop, abdomen soft nontender, extremities no edema, right radial cath site is clear.  Patient with multivessel severe CAD (left dominant) and underlying diabetes.  Pending cardiac surgical consultation for consideration of CABG.  Will continue current medicines which include aspirin, rosuvastatin , metoprolol, and IV heparin.  Discussed some aspects of CABG surgery with the patient including postoperative recovery and expectations.  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  Otherwise, as outlined above.  Appreciate cardiac surgical consultation.  Marcus Hart,  M.D. 11/05/2023 12:10 PM

## 2023-11-05 NOTE — Consult Note (Addendum)
 [address]       [place] [address]             [phone]        [facility] Medical Record #[id number] Date of Birth: 1965-10-06  Referring: Dr. Filiberto Hug, MD Primary Care: Benjiman Bras, MD Primary Cardiologist:None  Chief Complaint:    Chief Complaint  Patient presents with   Chest Pain  Reason for consultation: Coronary artery disease  History of Present Illness:     This is a 58 year old [place] male with a past medical history of hyperlipidemia and diabetes mellitus who presented to the [facility] on 11/04/2023 in the early am with complaints of intermittent chest pain, that occurred with both exertion and at rest. Typically, chest pain would resolve after a few minutes;however on 11/04/2023 the chest pain became more constant. The pain radiates to his neck and right arm and he feels as though he has chills. The pain has not stopped him from doing things. Per wife, he was mowing lawn and cutting trees one week ago. He denied shortness of breath, nausea, abdominal pain, or syncope. Chest x ray showed no active disease. EKG showed T wave inversions in leads II, III, and aVF and subtle ST depressions in the precordial leads. Initial Troponin I (high sensitivity) was 383 and max's at 412. He ruled in for a NSTEMI. He was started on Heparin and given aspirin. He was transported to [facility] for further evaluation and treatment.  He had an echocardiogram done 11/04/2023 which showed LVEF 55-60%, LV has no regional wall abnormalities, and no significant valvular abnormalities. He then underwent a cardiac catheterization and was found to have an ostial LAD lesion with a 70% stenosis, proximal to mid LAD with aa 50% stenosis (IVUS determined lesion is calcified), mid to distal Circumflex with a 99% stenosis, and a RV branch with a 90% stenosis. Cardiothoracic consultation was requested for consideration of coronary artery bypass  grafting surgery. At the time of my exam, patient denies chest pain.  Patient is married (wife at bedside), has 6 children, and works as a Museum/gallery exhibitions officer. He is very active.  Current Activity/ Functional Status: Patient is independent with mobility/ambulation, transfers, ADL's, IADL's.   Zubrod Score: At the time of surgery this patient's most appropriate activity status/level should be described as: []     0    Normal activity, no symptoms [x]     1    Restricted in physical strenuous activity but ambulatory, able to do out light work []     2    Ambulatory and capable of self care, unable to do work activities, up and about more than 50%  Of the time                            []     3    Only limited self care, in bed greater than 50% of waking hours []     4    Completely disabled, no self care, confined to bed or chair []     5    Moribund  Past Medical History:  Diagnosis Date   Diabetes mellitus (HCC)    Diabetes mellitus without complication (HCC)    Phreesia 11/10/2019   Hyperlipidemia     Past Surgical History:  Procedure Laterality Date   COLONOSCOPY     CORONARY PRESSURE/FFR STUDY N/A 11/04/2023  Procedure: CORONARY PRESSURE/FFR STUDY;  Surgeon: Cody Das, MD;  Location: [facility] INVASIVE CV LAB;  Service: Cardiovascular;  Laterality: N/A;   CORONARY ULTRASOUND/IVUS N/A 11/04/2023   Procedure: Coronary Ultrasound/IVUS;  Surgeon: Cody Das, MD;  Location: [facility] INVASIVE CV LAB;  Service: Cardiovascular;  Laterality: N/A;   LEFT HEART CATH AND CORONARY ANGIOGRAPHY N/A 11/04/2023   Procedure: LEFT HEART CATH AND CORONARY ANGIOGRAPHY;  Surgeon: Cody Das, MD;  Location: [facility] INVASIVE CV LAB;  Service: Cardiovascular;  Laterality: N/A;   NO PAST SURGERIES     UPPER GASTROINTESTINAL ENDOSCOPY      Social History   Tobacco Use  Smoking Status Never  Smokeless Tobacco Never    Social History   Substance and Sexual Activity  Alcohol Use Never    Alcohol/week: 0.0 standard drinks of alcohol    Allergies: No Known Allergies  Current Facility-Administered Medications  Medication Dose Route Frequency Provider Last Rate Last Admin   acetaminophen  (TYLENOL ) tablet 650 mg  650 mg Oral Q4H PRN Christena Covert, MD       aspirin EC tablet 81 mg  81 mg Oral Daily Christena Covert, MD   81 mg at 11/05/23 0957   heparin ADULT infusion 100 units/mL (25000 units/250mL)  900 Units/hr Intravenous Continuous Sheron Dixons, RPH 9 mL/hr at 11/04/23 2121 900 Units/hr at 11/04/23 2121   insulin aspart (novoLOG) injection 0-15 Units  0-15 Units Subcutaneous TID WC Williams, Evan, PA-C       insulin aspart (novoLOG) injection 0-5 Units  0-5 Units Subcutaneous QHS Williams, Evan, PA-C       metoprolol tartrate (LOPRESSOR) tablet 12.5 mg  12.5 mg Oral BID Christena Covert, MD   12.5 mg at 11/05/23 0959   nitroGLYCERIN 50 mg in dextrose 5 % 250 mL (0.2 mg/mL) infusion  0-200 mcg/min Intravenous Titrated Cody Das, MD   Stopped at 11/05/23 0950   ondansetron (ZOFRAN) injection 4 mg  4 mg Intravenous Q6H PRN Christena Covert, MD       rosuvastatin  (CRESTOR ) tablet 40 mg  40 mg Oral Daily Sullivan, Lonnie, MD   40 mg at 11/05/23 0957    Facility-Administered Medications Prior to Admission  Medication Dose Route Frequency Provider Last Rate Last Admin   0.9 %  sodium chloride  infusion  500 mL Intravenous Once Danis, Henry L III, MD       Medications Prior to Admission  Medication Sig Dispense Refill Last Dose/Taking   aspirin EC 81 MG tablet Take 81 mg by mouth daily. Swallow whole.   11/02/2023   Dulaglutide  (TRULICITY ) 3 MG/0.5ML SOAJ Inject 3 mg into the skin once a week. 6 mL 1 10/22/2023   metFORMIN  (GLUCOPHAGE ) 1000 MG tablet Take 1 tablet (1,000 mg total) by mouth 2 (two) times daily with a meal. 180 tablet 1 11/03/2023 Morning   rosuvastatin  (CRESTOR ) 10 MG tablet Take 1 tablet (10 mg total) by mouth daily. 90 tablet 1 11/03/2023 Morning    Insulin Pen Needle (PEN NEEDLES) 32G X 4 MM MISC Use with Victoza  to inject once daily 90 each 3    Lancets (ONETOUCH DELICA PLUS LANCET33G) MISC USE AS DIRECTED TO TEST BLOOD GLUCOSE 100 each 1    ONETOUCH VERIO test strip USE AS DIRECTED 100 strip 0     Family History  Problem Relation Age of Onset   Diabetes Mother    Bladder Cancer Mother    Hypertension Mother    Ulcers Father    Benign prostatic  hyperplasia Father    Colon cancer Neg Hx    Colon polyps Neg Hx    Esophageal cancer Neg Hx    Rectal cancer Neg Hx    Stomach cancer Neg Hx   Mother had a pacemaker, aunt had coronary artery disease and had CABG   Review of Systems:    Cardiac Review of Systems: Y or  [  N  ]= no  Chest Pain [   Y-on admission ] Syncope  Discordia.Diesel  ]     General Review of Systems: [Y] = yes [  N]=no Constitional:fatigue [  Y]; nausea [ N ];  fever [N  ];                                                           Dental: Last Dentist visit: Last year;has another appointment soon  Resp: cough [  N];  wheezing[  ];  hemoptysis[ N ]; shortness of breath[ N ];  GI:  vomiting[ N ];  dysphagia[N  ]; melena[ N ];  hematochezia [ N ];  ZO:[id number][ N ];                Skin: rash, swelling[  N];, peripheral edema[ N ];   Musculosketetal: joint erythema[ N ];  joint pain[ N ];    Heme/Lymph:  anemia[Y  ];  Neuro:  stroke[ N ];  difficulty walking[  N];  Endocrine: diabetes[ Y ];  thyroid  dysfunction[ N ];               Physical Exam: BP 104/66   Pulse 65   Temp 99.8 F (37.7 C) (Oral) Comment: pt just drank some coffee.  Resp 20   Ht 5' 10.75 (1.797 m)   Wt 74.9 kg   SpO2 99%   BMI 23.19 kg/m    General appearance: alert, cooperative, and no distress Head: Normocephalic, without obvious abnormality, atraumatic Neck: no carotid bruit, no JVD, and supple, symmetrical, trachea midline Resp: clear to auscultation bilaterally Cardio: RRR, no murmur or rub GI: Soft, non tender, bowel sounds  present Extremities: No LE edema. Palpable DP/PT bilaterally Neurologic: Grossly normal  Diagnostic Studies & Laboratory data:     Recent Radiology Findings:   CARDIAC CATHETERIZATION Result Date: 11/04/2023 Images from the original result were not included. Coronary angiography 11/04/2023: LM: Essentially non existent with separate dual ostia to LAD and LCx LAD: Medium caliber vessel          Severe dampening with 6 [place] EBU 3.0 guide engagement          Ostial 70% stenosis, CSA 5.1 mm2          Another lesion under appreciated angiographically, but noted on IVUS about 26 mm from the ostium with severe 180 deg calcification and CSA 3.2 mm2          RFR in mid LAD 0.83. Significant gradient was in prox-mid LAD, not in ostial LAD LCx: Large dominant vessel         Mid to distal 99% stenosis (likely culprit) with TIMI I flow distally RCA: Nondominant           RV marginal ostial 90% stenosis LVEDP 14 mmHg Challenging decision making re: revascularization given concern for severe LAD disease, in addition to likely culprit lesion  in large dominant LCx, in a 58 y/o diabetic patient with NSTEMI and normal LVEF. He is currently chest pain free. Recommend Aspirin, IV heparin, IV nitroglycerin. In case there is any acute chest pain overnight with EKG changes concerning for posterior injury, we could consider urgent revascularization to Lcx. If patient stays stable, I will discuss the case in multidisciplinary heart team meeting tomorrow morning if he could benefit from CABG better. Cody Das, MD   Diagnostic Dominance: Left  Intervention  ECHOCARDIOGRAM COMPLETE Result Date: 11/04/2023    ECHOCARDIOGRAM REPORT   Patient Name:   DMANI MIZER Date of Exam: 11/04/2023 Medical Rec #:  [id number]      Height:       70.7 in Accession #:    [id number]     Weight:       171.3 lb Date of Birth:  [birth year]       BSA:          1.969 m Patient Age:    58 years       BP:           96/63 mmHg Patient Gender: M               HR:           63 bpm. Exam Location:  Inpatient Procedure: 2D Echo, Cardiac Doppler, Color Doppler and Saline Contrast Bubble            Study (Both Spectral and Color Flow Doppler were utilized during            procedure). Indications:    NSTEMI  History:        Patient has prior history of Echocardiogram examinations and                 Patient has no prior history of Echocardiogram examinations.  Sonographer:    Griselda Lederer Referring Phys: [id number] Christena Covert IMPRESSIONS  1. Left ventricular ejection fraction, by estimation, is 55 to 60%. The left ventricle has normal function. The left ventricle has no regional wall motion abnormalities. Left ventricular diastolic parameters were normal.  2. Right ventricular systolic function is normal. The right ventricular size is normal.  3. The mitral valve is normal in structure. No evidence of mitral valve regurgitation. No evidence of mitral stenosis.  4. The aortic valve is normal in structure. There is mild calcification of the aortic valve. Aortic valve regurgitation is not visualized. No aortic stenosis is present.  5. The inferior vena cava is normal in size with greater than 50% respiratory variability, suggesting right atrial pressure of 3 mmHg. FINDINGS  Left Ventricle: Left ventricular ejection fraction, by estimation, is 55 to 60%. The left ventricle has normal function. The left ventricle has no regional wall motion abnormalities. The left ventricular internal cavity size was normal in size. There is  no left ventricular hypertrophy. Left ventricular diastolic parameters were normal. Right Ventricle: The right ventricular size is normal. No increase in right ventricular wall thickness. Right ventricular systolic function is normal. Left Atrium: Left atrial size was normal in size. Right Atrium: Right atrial size was normal in size. Pericardium: There is no evidence of pericardial effusion. Mitral Valve: The mitral valve is normal in structure.  No evidence of mitral valve regurgitation. No evidence of mitral valve stenosis. Tricuspid Valve: The tricuspid valve is normal in structure. Tricuspid valve regurgitation is not demonstrated. No evidence of tricuspid stenosis. Aortic Valve: The aortic valve is normal in structure. There  is mild calcification of the aortic valve. Aortic valve regurgitation is not visualized. No aortic stenosis is present. Pulmonic Valve: The pulmonic valve was normal in structure. Pulmonic valve regurgitation is not visualized. No evidence of pulmonic stenosis. Aorta: The aortic root is normal in size and structure. Venous: The inferior vena cava is normal in size with greater than 50% respiratory variability, suggesting right atrial pressure of 3 mmHg. IAS/Shunts: No atrial level shunt detected by color flow Doppler. Agitated saline contrast was given intravenously to evaluate for intracardiac shunting.  LEFT VENTRICLE PLAX 2D LVIDd:         4.50 cm      Diastology LVIDs:         2.80 cm      LV e' medial:    6.20 cm/s LV PW:         0.80 cm      LV E/e' medial:  9.2 LV IVS:        0.70 cm      LV e' lateral:   12.30 cm/s LVOT diam:     2.20 cm      LV E/e' lateral: 4.6 LVOT Area:     3.80 cm  LV Volumes (MOD) LV vol d, MOD A2C: 105.0 ml LV vol d, MOD A4C: 77.4 ml LV vol s, MOD A2C: 44.2 ml LV vol s, MOD A4C: 40.4 ml LV SV MOD A2C:     60.8 ml LV SV MOD A4C:     77.4 ml LV SV MOD BP:      55.0 ml RIGHT VENTRICLE             IVC RV Basal diam:  3.10 cm     IVC diam: 1.00 cm RV S prime:     10.60 cm/s TAPSE (M-mode): 2.3 cm LEFT ATRIUM             Index LA diam:        2.70 cm 1.37 cm/m LA Vol (A2C):   36.3 ml 18.43 ml/m LA Vol (A4C):   30.5 ml 15.49 ml/m LA Biplane Vol: 35.2 ml 17.87 ml/m   AORTA Ao Root diam: 3.60 cm Ao Asc diam:  3.10 cm MITRAL VALVE MV Area (PHT): 3.95 cm    SHUNTS MV Decel Time: 192 msec    Systemic Diam: 2.20 cm MV E velocity: 57.10 cm/s MV A velocity: 64.50 cm/s MV E/A ratio:  0.89 Aditya Sabharwal  Electronically signed by Alwin Baars Signature Date/Time: 11/04/2023/1:12:10 PM    Final    DG Chest Portable 1 View Result Date: 11/03/2023 CLINICAL DATA:  Chest pain EXAM: PORTABLE CHEST 1 VIEW COMPARISON:  11/22/2015 FINDINGS: The heart size and mediastinal contours are within normal limits. Both lungs are clear. The visualized skeletal structures are unremarkable. IMPRESSION: No active disease. Electronically Signed   By: Esmeralda Hedge M.D.   On: 11/03/2023 22:58     I have independently reviewed the above radiologic studies and discussed with the patient   Recent Lab Findings: Lab Results  Component Value Date   WBC 3.4 (L) 11/05/2023   HGB 12.9 (L) 11/05/2023   HCT 39.0 11/05/2023   PLT 171 11/05/2023   GLUCOSE 144 (H) 11/04/2023   CHOL 157 11/04/2023   TRIG 64 11/04/2023   HDL 60 11/04/2023   LDLCALC 84 11/04/2023   ALT 19 11/04/2023   AST 42 (H) 11/04/2023   NA 137 11/04/2023   K 4.0 11/04/2023   CL 103 11/04/2023  CREATININE 0.85 11/04/2023   BUN 12 11/04/2023   CO2 26 11/04/2023   TSH 1.231 11/04/2023   HGBA1C 7.6 (H) 11/04/2023   Assessment / Plan:   S/p NSTEMI, coronary artery disease-on Heparin drip. Dr. Luna Salinas to review cardiac catheterization films and evaluate for coronary artery bypass grafting surgery (appears to likely be a candidate). Of note, if surgical candidate and considering radial artery conduit, he is right hand dominant. 2. History of hyperlipidemia-on Rosuvastatin  3. History of DM-HGA1C 7.6. On Trulicity  and Metformin  prior to admission 4. Anemia-H and H this am up to 12.9 and 39.   I  spent 25 minutes counseling the patient face to face.   Donielle Zimmerman PA-C 11/05/2023 11:46 AM   Patient seen and examined, records and cath images reviewed.    58 yo man with history of hyperlipidemia and type II diabetes presents with unstable CP and r/I for non-STEMI.  At cath has severe 2 vessel CAD in left dominant circulation.  CABG  indicated for survival benefit and relief of symptoms.  Has normal Allen's tet on left by pulse ox.  Will plan left radial in addition to LIMA.  I discussed the general nature of the procedure, including the need for general anesthesia, the incisions to be used, the use of cardiopulmonary bypass, and the use of temporary pacemaker wires and drainage tubes postoperatively with Mr and Mrs Cando.  We discussed the expected hospital stay, overall recovery and short and long term outcomes. I informed them of the indications, risks, benefits and alternatives.   He understands the risks include, but are not limited to death, stroke, MI, DVT/PE, bleeding, possible need for transfusion, infections, cardiac arrhythmias, as well as other organ system dysfunction including respiratory, renal, or GI complications.  We discussed use of left radial artery as well.  He accepts the risks and agrees to proceed.  For CABG Monday  Landon Pinion C. Luna Salinas, MD [facility] [phone]

## 2023-11-05 NOTE — Telephone Encounter (Signed)
 Patient Product/process development scientist completed.    The patient is insured through [employer]. Patient has [employer], may use a copay card, and/or apply for patient assistance if available.    Ran test claim for ticagrelor (Brilinta) 90 mg and the current 30 day co-pay is $0.00.  Ran test claim for Farxiga 10 mg and the current 30 day co-pay is $15.00.  Ran test claim for Jardiance 10 mg and the current 30 day co-pay is $15.00.  This test claim was processed through [facility]- copay amounts may vary at other pharmacies due to pharmacy/plan contracts, or as the patient moves through the different stages of their insurance plan.     Morgan Arab, CPHT Pharmacy Technician III Certified Patient Advocate [facility] Patient Advocate Team Direct Number: [phone]  Fax: [phone]

## 2023-11-05 NOTE — Progress Notes (Deleted)
 PHARMACY - ANTICOAGULATION CONSULT NOTE  Pharmacy Consult for heparin Indication: chest pain/ACS  No Known Allergies  Patient Measurements: Height: 5' 10.75 (179.7 cm) Weight: 74.9 kg (165 lb 2 oz) IBW/kg (Calculated) : 74.73 HEPARIN DW (KG): 77.7  Vital Signs: Temp: 99.6 F (37.6 C) (06/20 0400) Temp Source: Oral (06/20 0400) BP: 100/68 (06/20 0400) Pulse Rate: 68 (06/20 0400)  Labs: Recent Labs    11/03/23 2240 11/04/23 0356 11/04/23 0816  HGB 12.4* 12.6*  --   HCT 37.0* 38.5*  --   PLT 147* 154  --   HEPARINUNFRC  --   --  0.30  CREATININE 0.93 0.85  --     Estimated Creatinine Clearance: 101.3 mL/min (by C-G formula based on SCr of 0.85 mg/dL).   Medical History: Past Medical History:  Diagnosis Date   Diabetes mellitus (HCC)    Diabetes mellitus without complication (HCC)    Phreesia 11/10/2019   Hyperlipidemia    Assessment: 54 yoM presented to ED with chest pain found to have NSTEMI. Troponin elevated with plans for cardiac cath. No anticoagulation prior to admission. Pharmacy consulted to dose heparin for ACS.  Heparin to resumed post cath on 6/19. -heparin level= 0.3 and at goal on 900 units/hr  Goal of Therapy:  Heparin level 0.3-0.7 units/ml Monitor platelets by anticoagulation protocol: Yes   Plan:  -Increase heparin to 1000 units/hr to keep in goal -Heparin level in 6 hrs  Baxter Limber, PharmD Clinical Pharmacist **Pharmacist phone directory can now be found on [email or web address] ([email or web address]).  Listed under [facility].

## 2023-11-05 NOTE — Progress Notes (Addendum)
 PHARMACY - ANTICOAGULATION CONSULT NOTE  Pharmacy Consult for heparin Indication: chest pain/ACS  No Known Allergies  Patient Measurements: Height: 5' 10.75 (179.7 cm) Weight: 74.9 kg (165 lb 2 oz) IBW/kg (Calculated) : 74.73 HEPARIN DW (KG): 77.7  Vital Signs: Temp: 99 F (37.2 C) (06/20 1607) Temp Source: Oral (06/20 1607) BP: 100/69 (06/20 1607) Pulse Rate: 69 (06/20 1607)  Labs: Recent Labs    11/03/23 2240 11/04/23 0356 11/04/23 0816 11/05/23 0904 11/05/23 1817  HGB 12.4* 12.6*  --  12.9*  --   HCT 37.0* 38.5*  --  39.0  --   PLT 147* 154  --  171  --   HEPARINUNFRC  --   --  0.30 0.11* 0.32  CREATININE 0.93 0.85  --   --   --     Estimated Creatinine Clearance: 101.3 mL/min (by C-G formula based on SCr of 0.85 mg/dL).   Medical History: Past Medical History:  Diagnosis Date   Diabetes mellitus (HCC)    Diabetes mellitus without complication (HCC)    Phreesia 11/10/2019   Hyperlipidemia    Assessment: 67 yoM presented to ED with chest pain found to have NSTEMI. Troponin elevated with plans for cardiac cath. No anticoagulation prior to admission. Pharmacy consulted to dose heparin for ACS.  Heparin to resumed post cath on 6/19. 6 hour Heparin level= 0.32, therapeutic after heparin rate increased to 1150 units/hr. No bleeding reported.  Goal of Therapy:  Heparin level 0.3-0.7 units/ml Monitor platelets by anticoagulation protocol: Yes   Plan:  Continue Heparin infusion at 1150 units/hr -Heparin level ~6 hrs to confirm  Daily HL, CBC   Alisa Irish, RPh Clinical Pharmacist **Pharmacist phone directory can now be found on [email or web address] ([email or web address]).  Listed under [facility].

## 2023-11-05 NOTE — Plan of Care (Signed)
   Problem: Coping: Goal: Ability to adjust to condition or change in health will improve Outcome: Progressing

## 2023-11-05 NOTE — Progress Notes (Signed)
 PHARMACY - ANTICOAGULATION CONSULT NOTE  Pharmacy Consult for heparin Indication: chest pain/ACS  No Known Allergies  Patient Measurements: Height: 5' 10.75 (179.7 cm) Weight: 74.9 kg (165 lb 2 oz) IBW/kg (Calculated) : 74.73 HEPARIN DW (KG): 77.7  Vital Signs: Temp: 99.8 F (37.7 C) (06/20 1100) Temp Source: Oral (06/20 1100) BP: 104/66 (06/20 1000) Pulse Rate: 65 (06/20 1000)  Labs: Recent Labs    11/03/23 2240 11/04/23 0356 11/04/23 0816 11/05/23 0904  HGB 12.4* 12.6*  --  12.9*  HCT 37.0* 38.5*  --  39.0  PLT 147* 154  --  171  HEPARINUNFRC  --   --  0.30 0.11*  CREATININE 0.93 0.85  --   --     Estimated Creatinine Clearance: 101.3 mL/min (by C-G formula based on SCr of 0.85 mg/dL).   Medical History: Past Medical History:  Diagnosis Date   Diabetes mellitus (HCC)    Diabetes mellitus without complication (HCC)    Phreesia 11/10/2019   Hyperlipidemia    Assessment: 58 yoM presented to ED with chest pain found to have NSTEMI. Troponin elevated with plans for cardiac cath. No anticoagulation prior to admission. Pharmacy consulted to dose heparin for ACS.  Heparin to resumed post cath on 6/19. -heparin level= 0.11 and below goal on 900 units/hr  Goal of Therapy:  Heparin level 0.3-0.7 units/ml Monitor platelets by anticoagulation protocol: Yes   Plan:  -Increase heparin to 1150 units/hr -Heparin level in 6 hrs  Baxter Limber, PharmD Clinical Pharmacist **Pharmacist phone directory can now be found on [email or web address] ([email or web address]).  Listed under [facility].

## 2023-11-06 DIAGNOSIS — I214 Non-ST elevation (NSTEMI) myocardial infarction: Secondary | ICD-10-CM

## 2023-11-06 LAB — CBC
HCT: 39.2 % (ref 39.0–52.0)
Hemoglobin: 12.8 g/dL — ABNORMAL LOW (ref 13.0–17.0)
MCH: 28 pg (ref 26.0–34.0)
MCHC: 32.7 g/dL (ref 30.0–36.0)
MCV: 85.8 fL (ref 80.0–100.0)
Platelets: 165 10*3/uL (ref 150–400)
RBC: 4.57 MIL/uL (ref 4.22–5.81)
RDW: 12.8 % (ref 11.5–15.5)
WBC: 4.2 10*3/uL (ref 4.0–10.5)
nRBC: 0 % (ref 0.0–0.2)

## 2023-11-06 LAB — GLUCOSE, CAPILLARY
Glucose-Capillary: 174 mg/dL — ABNORMAL HIGH (ref 70–99)
Glucose-Capillary: 184 mg/dL — ABNORMAL HIGH (ref 70–99)
Glucose-Capillary: 199 mg/dL — ABNORMAL HIGH (ref 70–99)
Glucose-Capillary: 200 mg/dL — ABNORMAL HIGH (ref 70–99)

## 2023-11-06 LAB — HEPARIN LEVEL (UNFRACTIONATED)
Heparin Unfractionated: 0.29 [IU]/mL — ABNORMAL LOW (ref 0.30–0.70)
Heparin Unfractionated: 0.47 [IU]/mL (ref 0.30–0.70)
Heparin Unfractionated: 0.49 [IU]/mL (ref 0.30–0.70)

## 2023-11-06 LAB — LIPOPROTEIN A (LPA): Lipoprotein (a): 219.9 nmol/L — ABNORMAL HIGH (ref <75.0)

## 2023-11-06 NOTE — Progress Notes (Signed)
 PHARMACY - ANTICOAGULATION CONSULT NOTE  Pharmacy Consult for heparin  Indication: chest pain/ACS  No Known Allergies  Patient Measurements: Height: 5' 10.75 (179.7 cm) Weight: 74.5 kg (164 lb 3.9 oz) IBW/kg (Calculated) : 74.73 HEPARIN  DW (KG): 77.7  Vital Signs: Temp: 98.4 F (36.9 C) (06/21 1126) Temp Source: Oral (06/21 1126) BP: 96/64 (06/21 0843) Pulse Rate: 65 (06/21 0543)  Labs: Recent Labs    11/03/23 2240 11/04/23 0356 11/04/23 0816 11/05/23 0904 11/05/23 1817 11/06/23 0034 11/06/23 1140  HGB 12.4* 12.6*  --  12.9*  --  12.8*  --   HCT 37.0* 38.5*  --  39.0  --  39.2  --   PLT 147* 154  --  171  --  165  --   HEPARINUNFRC  --   --    < > 0.11* 0.32 0.29* 0.47  CREATININE 0.93 0.85  --   --   --   --   --    < > = values in this interval not displayed.    Estimated Creatinine Clearance: 101 mL/min (by C-G formula based on SCr of 0.85 mg/dL).   Medical History: Past Medical History:  Diagnosis Date   Diabetes mellitus (HCC)    Diabetes mellitus without complication (HCC)    Phreesia 11/10/2019   Hyperlipidemia    Assessment: 63 yoM presented to ED with chest pain found to have NSTEMI. Troponin elevated with plans for cardiac cath. No anticoagulation prior to admission. Pharmacy consulted to dose heparin  for ACS.  Heparin  level= 0.47, now therapeutic after heparin  rate increased to 1300 units/hr. No bleeding reported or issues with gtt running continuously per RN  Goal of Therapy:  Heparin  level 0.3-0.7 units/ml Monitor platelets by anticoagulation protocol: Yes   Plan:  Continue Heparin  infusion at 1300 units/hr 6h confirmatory heparin  level Daily heparin  level, CBC CABG 6/23  Adrianne Shackleton A. Lyle, PharmD, BCPS, FNKF Clinical Pharmacist [place] Please utilize Amion for appropriate phone number to reach the unit pharmacist [facility])  11/06/2023 12:45 PM

## 2023-11-06 NOTE — Progress Notes (Signed)
 PHARMACY - ANTICOAGULATION CONSULT NOTE  Pharmacy Consult for heparin  Indication: chest pain/ACS  No Known Allergies  Patient Measurements: Height: 5' 10.75 (179.7 cm) Weight: 74.5 kg (164 lb 3.9 oz) IBW/kg (Calculated) : 74.73 HEPARIN  DW (KG): 77.7  Vital Signs: Temp: 98.3 F (36.8 C) (06/21 1639) Temp Source: Oral (06/21 1639) BP: 107/67 (06/21 1126) Pulse Rate: 64 (06/21 1126)  Labs: Recent Labs    11/03/23 2240 11/04/23 0356 11/04/23 0816 11/05/23 0904 11/05/23 1817 11/06/23 0034 11/06/23 1140 11/06/23 1720  HGB 12.4* 12.6*  --  12.9*  --  12.8*  --   --   HCT 37.0* 38.5*  --  39.0  --  39.2  --   --   PLT 147* 154  --  171  --  165  --   --   HEPARINUNFRC  --   --    < > 0.11*   < > 0.29* 0.47 0.49  CREATININE 0.93 0.85  --   --   --   --   --   --    < > = values in this interval not displayed.    Estimated Creatinine Clearance: 101 mL/min (by C-G formula based on SCr of 0.85 mg/dL).   Medical History: Past Medical History:  Diagnosis Date   Diabetes mellitus (HCC)    Diabetes mellitus without complication (HCC)    Phreesia 11/10/2019   Hyperlipidemia    Assessment: 55 yoM presented to ED with chest pain found to have NSTEMI. Troponin elevated with plans for cardiac cath. No anticoagulation prior to admission. Pharmacy consulted to dose heparin  for ACS.  Heparin  level= 0.47, now therapeutic after heparin  rate increased to 1300 units/hr. No bleeding reported or issues with gtt running continuously per RN  PM: confirmatory heparin  level is therapeutic at 0.49 on heparin  1300 units/hr. No issues with the infusion or bleeding reported.  Goal of Therapy:  Heparin  level 0.3-0.7 units/ml Monitor platelets by anticoagulation protocol: Yes   Plan:  Continue Heparin  infusion at 1300 units/hr Daily heparin  level, CBC CABG 6/23  Rocky Slade, PharmD, BCPS Clinical Pharmacist [place] Please utilize Amion for appropriate phone number to reach the  unit pharmacist [facility])  11/06/2023 5:57 PM

## 2023-11-06 NOTE — Progress Notes (Signed)
 CARDIAC REHAB PHASE I      Pre-op OHS education including OHS booklet, OHS handout, IS use, mobility importance, home needs at discharge and sternal precautions/move in the tube reviewed. All questions and concerns addressed. Will continue to follow.   Vaughn Asberry Hacking, RN BSN 11/06/2023 11:01 AM

## 2023-11-06 NOTE — Progress Notes (Signed)
  Progress Note  Patient Name: Laurence Lahey Date of Encounter: 11/06/2023 [facility] Cardiologist: Ozell Fell, MD   Interval Summary   Doing well no chest pain awaiting surgery Monday Wife and family in room.  Several questions answered. Frustrated that he was going to the doctor frequently taking care of his blood pressure, cholesterol and still had this disease process.  Vital Signs Vitals:   11/05/23 2001 11/06/23 0059 11/06/23 0543 11/06/23 0843  BP: 106/81 109/75 111/89 96/64  Pulse: 69 65 65   Resp: 19 19 18 18   Temp: 99 F (37.2 C) 98.1 F (36.7 C) 98.4 F (36.9 C) 98.5 F (36.9 C)  TempSrc: Oral Oral Oral Oral  SpO2: 100% 99% 99%   Weight:   74.5 kg   Height:        Intake/Output Summary (Last 24 hours) at 11/06/2023 0959 Last data filed at 11/06/2023 0600 Gross per 24 hour  Intake 997.74 ml  Output --  Net 997.74 ml      11/06/2023    5:43 AM 11/05/2023    4:00 AM 11/03/2023   11:25 PM  Last 3 Weights  Weight (lbs) 164 lb 3.9 oz 165 lb 2 oz 171 lb 4.8 oz  Weight (kg) 74.5 kg 74.9 kg 77.7 kg      Telemetry/ECG  No adverse arrhythmias, sinus rhythm- Personally Reviewed  Physical Exam  GEN: No acute distress.   Neck: No JVD Cardiac: RRR, no murmurs, rubs, or gallops.  Respiratory: Clear to auscultation bilaterally. GI: Soft, nontender, non-distended  MS: No edema  Assessment & Plan   Non-STEMI CAD Hyperlipidemia Type 2 diabetes Anemia  -Multivessel severe CAD left dominant with underlying diabetes.  Plan on CABG with left radial in addition to LIMA. -CABG Monday. -Aspirin  81 mg, metoprolol  12.5 mg twice a day, heparin  IV, nitroglycerin  IV was stopped, continue rosuvastatin  40 mg a day with LDL goal of 55. -Hemoglobin A1c 7.6 on GLP-1 and metformin  at home.  Ideally consider SGLT2 prior to discharge given cardiac disease. -Hemoglobin 12.6     For questions or updates, please contact [facility] Please consult  [email or web address] for contact info under       Signed, Oneil Parchment, MD

## 2023-11-06 NOTE — Progress Notes (Signed)
 PHARMACY - ANTICOAGULATION CONSULT NOTE  Pharmacy Consult for heparin  Indication: chest pain/ACS  No Known Allergies  Patient Measurements: Height: 5' 10.75 (179.7 cm) Weight: 74.9 kg (165 lb 2 oz) IBW/kg (Calculated) : 74.73 HEPARIN  DW (KG): 77.7  Vital Signs: Temp: 98.1 F (36.7 C) (06/21 0059) Temp Source: Oral (06/21 0059) BP: 109/75 (06/21 0059) Pulse Rate: 69 (06/20 2001)  Labs: Recent Labs    11/03/23 2240 11/04/23 0356 11/04/23 0816 11/05/23 0904 11/05/23 1817 11/06/23 0034  HGB 12.4* 12.6*  --  12.9*  --  12.8*  HCT 37.0* 38.5*  --  39.0  --  39.2  PLT 147* 154  --  171  --  165  HEPARINUNFRC  --   --    < > 0.11* 0.32 0.29*  CREATININE 0.93 0.85  --   --   --   --    < > = values in this interval not displayed.    Estimated Creatinine Clearance: 101.3 mL/min (by C-G formula based on SCr of 0.85 mg/dL).   Medical History: Past Medical History:  Diagnosis Date   Diabetes mellitus (HCC)    Diabetes mellitus without complication (HCC)    Phreesia 11/10/2019   Hyperlipidemia    Assessment: 4 yoM presented to ED with chest pain found to have NSTEMI. Troponin elevated with plans for cardiac cath. No anticoagulation prior to admission. Pharmacy consulted to dose heparin  for ACS.  Heparin  to resumed post cath on 6/19.  Heparin  level= 0.29, now subtherapeutic after heparin  rate increased to 1150 units/hr. No bleeding reported or issues with gtt running continuously per RN  Goal of Therapy:  Heparin  level 0.3-0.7 units/ml Monitor platelets by anticoagulation protocol: Yes   Plan:  Increase Heparin  infusion to 1300 units/hr 6h heparin  level Daily heparin  level, CBC CABG 6/23  Lynwood Poplar, PharmD, BCPS Clinical Pharmacist 11/06/2023 1:50 AM

## 2023-11-06 NOTE — Plan of Care (Signed)
°  Problem: Education: °Goal: Understanding of cardiac disease, CV risk reduction, and recovery process will improve °Outcome: Progressing °Goal: Individualized Educational Video(s) °Outcome: Progressing °  °

## 2023-11-07 ENCOUNTER — Inpatient Hospital Stay (HOSPITAL_COMMUNITY)

## 2023-11-07 DIAGNOSIS — I25110 Atherosclerotic heart disease of native coronary artery with unstable angina pectoris: Secondary | ICD-10-CM | POA: Diagnosis not present

## 2023-11-07 DIAGNOSIS — I214 Non-ST elevation (NSTEMI) myocardial infarction: Secondary | ICD-10-CM | POA: Diagnosis not present

## 2023-11-07 DIAGNOSIS — Z01810 Encounter for preprocedural cardiovascular examination: Secondary | ICD-10-CM | POA: Diagnosis not present

## 2023-11-07 LAB — BLOOD GAS, ARTERIAL
Acid-Base Excess: 2.4 mmol/L — ABNORMAL HIGH (ref 0.0–2.0)
Bicarbonate: 27.2 mmol/L (ref 20.0–28.0)
Drawn by: 511551
O2 Saturation: 98.8 %
Patient temperature: 37.1
pCO2 arterial: 42 mmHg (ref 32–48)
pH, Arterial: 7.42 (ref 7.35–7.45)
pO2, Arterial: 94 mmHg (ref 83–108)

## 2023-11-07 LAB — GLUCOSE, CAPILLARY
Glucose-Capillary: 153 mg/dL — ABNORMAL HIGH (ref 70–99)
Glucose-Capillary: 227 mg/dL — ABNORMAL HIGH (ref 70–99)
Glucose-Capillary: 197 mg/dL — ABNORMAL HIGH (ref 70–99)
Glucose-Capillary: 176 mg/dL — ABNORMAL HIGH (ref 70–99)

## 2023-11-07 LAB — CBC
HCT: 42.3 % (ref 39.0–52.0)
Hemoglobin: 13.8 g/dL (ref 13.0–17.0)
MCH: 28.2 pg (ref 26.0–34.0)
MCHC: 32.6 g/dL (ref 30.0–36.0)
MCV: 86.5 fL (ref 80.0–100.0)
Platelets: 191 10*3/uL (ref 150–400)
RBC: 4.89 MIL/uL (ref 4.22–5.81)
RDW: 12.8 % (ref 11.5–15.5)
WBC: 3.4 10*3/uL — ABNORMAL LOW (ref 4.0–10.5)
nRBC: 0 % (ref 0.0–0.2)

## 2023-11-07 LAB — TYPE AND SCREEN
ABO/RH(D): A NEG
Antibody Screen: NEGATIVE

## 2023-11-07 LAB — HEPARIN LEVEL (UNFRACTIONATED): Heparin Unfractionated: 0.55 [IU]/mL (ref 0.30–0.70)

## 2023-11-07 MED ORDER — CHLORHEXIDINE GLUCONATE CLOTH 2 % EX PADS
6.0000 | MEDICATED_PAD | Freq: Once | CUTANEOUS | Status: AC
  Administered 2023-11-07: 6 via TOPICAL

## 2023-11-07 MED ORDER — CHLORHEXIDINE GLUCONATE 0.12 % MT SOLN
15.0000 mL | Freq: Once | OROMUCOSAL | Status: AC
  Administered 2023-11-08: 15 mL via OROMUCOSAL
  Filled 2023-11-07: qty 15

## 2023-11-07 MED ORDER — BISACODYL 5 MG PO TBEC
5.0000 mg | DELAYED_RELEASE_TABLET | Freq: Once | ORAL | Status: AC
  Administered 2023-11-07: 5 mg via ORAL
  Filled 2023-11-07: qty 1

## 2023-11-07 MED ORDER — ALPRAZOLAM 0.25 MG PO TABS: 0.2500 mg | ORAL_TABLET | Freq: Three times a day (TID) | ORAL | Status: DC | PRN

## 2023-11-07 MED ORDER — METOPROLOL TARTRATE 12.5 MG HALF TABLET
12.5000 mg | ORAL_TABLET | Freq: Once | ORAL | Status: AC
  Administered 2023-11-08: 12.5 mg via ORAL

## 2023-11-07 MED ORDER — CHLORHEXIDINE GLUCONATE CLOTH 2 % EX PADS
6.0000 | MEDICATED_PAD | Freq: Once | CUTANEOUS | Status: AC
  Administered 2023-11-08: 6 via TOPICAL

## 2023-11-07 MED ORDER — MUPIROCIN 2 % EX OINT: 1.0000 | TOPICAL_OINTMENT | Freq: Two times a day (BID) | CUTANEOUS | Status: DC

## 2023-11-07 NOTE — Progress Notes (Signed)
  Progress Note  Patient Name: Marcus Hart Date of Encounter: 11/07/2023  [facility] Cardiologist: Ozell Fell, MD   Interval Summary   Doing well no chest pain awaiting surgery Monday Wife and family in room.  Several questions answered yesterday. Wife was frustrated that he was going to the doctor frequently taking care of his blood pressure, cholesterol and still had this disease process.  Today doing well.  No chest pain.  Vital Signs Vitals:   11/06/23 2000 11/07/23 0026 11/07/23 0200 11/07/23 0733  BP:  96/61 95/73 107/70  Pulse: 74 63 61 68  Resp:  20  20  Temp:  97.9 F (36.6 C)  97.9 F (36.6 C)  TempSrc:  Oral  Oral  SpO2: 99% 100% 99% 99%  Weight:      Height:       No intake or output data in the 24 hours ending 11/07/23 0940     11/06/2023    5:43 AM 11/05/2023    4:00 AM 11/03/2023   11:25 PM  Last 3 Weights  Weight (lbs) 164 lb 3.9 oz 165 lb 2 oz 171 lb 4.8 oz  Weight (kg) 74.5 kg 74.9 kg 77.7 kg      Telemetry/ECG  No adverse arrhythmias, sinus rhythm-stable from yesterday, personally Reviewed  Physical Exam  GEN: No acute distress.  No change Neck: No JVD no change Cardiac: RRR, no murmurs, rubs, or gallops.  No change Respiratory: Clear to auscultation bilaterally.  No change GI: Soft, nontender, non-distended no change MS: No edema no change  Assessment & Plan   Non-STEMI CAD Hyperlipidemia Type 2 diabetes Anemia  -Multivessel severe CAD left dominant with underlying diabetes.  Plan on CABG with left radial in addition to LIMA. -CABG Monday.  No changes made in plan.  Continue with current medication management as described below. -Aspirin  81 mg, metoprolol  12.5 mg twice a day, heparin  IV, nitroglycerin  IV was stopped, continue rosuvastatin  40 mg a day with LDL goal of 55. -Hemoglobin A1c 7.6 on GLP-1 and metformin  at home.  Ideally consider SGLT2 prior to discharge given cardiac disease. -Hemoglobin 13.8 up from  12.6 -Cardiac rehab phase 1 has visited. Spoke with sister who is a doctor in [place].     For questions or updates, please contact  [facility] Please consult [email or web address] for contact info under       Signed, Oneil Parchment, MD

## 2023-11-07 NOTE — Progress Notes (Signed)
 VASCULAR LAB    Pre CABG Dopplers have been performed.  See CV proc for preliminary results.   Candelario Steppe, RVT 11/07/2023, 11:29 AM

## 2023-11-07 NOTE — Progress Notes (Signed)
 3 Days Post-Op Procedure(s) (LRB): LEFT HEART CATH AND CORONARY ANGIOGRAPHY (N/A) CORONARY PRESSURE/FFR STUDY (N/A) Coronary Ultrasound/IVUS (N/A) Subjective: No complaints  Objective: Vital signs in last 24 hours: Temp:  [97.9 F (36.6 C)-98.4 F (36.9 C)] 97.9 F (36.6 C) (06/22 0733) Pulse Rate:  [61-74] 68 (06/22 0733) Cardiac Rhythm: Normal sinus rhythm (06/22 0750) Resp:  [18-20] 20 (06/22 0733) BP: (95-107)/(61-73) 107/70 (06/22 0733) SpO2:  [99 %-100 %] 99 % (06/22 0733)  Hemodynamic parameters for last 24 hours:    Intake/Output from previous day: No intake/output data recorded. Intake/Output this shift: No intake/output data recorded.  General appearance: alert, cooperative, and no distress Neurologic: intact Heart: regular rate and rhythm  Lab Results: Recent Labs    11/06/23 0034 11/07/23 0914  WBC 4.2 3.4*  HGB 12.8* 13.8  HCT 39.2 42.3  PLT 165 191   BMET: No results for input(s): NA, K, CL, CO2, GLUCOSE, BUN, CREATININE, CALCIUM  in the last 72 hours.  PT/INR: No results for input(s): LABPROT, INR in the last 72 hours. ABG No results found for: PHART, HCO3, TCO2, ACIDBASEDEF, O2SAT CBG (last 3)  Recent Labs    11/06/23 1636 11/06/23 2110 11/07/23 0634  GLUCAP 199* 200* 153*    Assessment/Plan: S/P Procedure(s) (LRB): LEFT HEART CATH AND CORONARY ANGIOGRAPHY (N/A) CORONARY PRESSURE/FFR STUDY (N/A) Coronary Ultrasound/IVUS (N/A) - For CABG tomorrow All questions answered   LOS: 3 days    [name] 11/07/2023

## 2023-11-07 NOTE — Progress Notes (Signed)
 PHARMACY - ANTICOAGULATION CONSULT NOTE  Pharmacy Consult for heparin  Indication: chest pain/ACS  No Known Allergies  Patient Measurements: Height: 5' 10.75 (179.7 cm) Weight: 74.5 kg (164 lb 3.9 oz) IBW/kg (Calculated) : 74.73 HEPARIN  DW (KG): 77.7  Vital Signs: Temp: 97.9 F (36.6 C) (06/22 0026) Temp Source: Oral (06/22 0026) BP: 95/73 (06/22 0200) Pulse Rate: 61 (06/22 0200)  Labs: Recent Labs    11/05/23 0904 11/05/23 1817 11/06/23 0034 11/06/23 1140 11/06/23 1720 11/07/23 0236  HGB 12.9*  --  12.8*  --   --   --   HCT 39.0  --  39.2  --   --   --   PLT 171  --  165  --   --   --   HEPARINUNFRC 0.11*   < > 0.29* 0.47 0.49 0.55   < > = values in this interval not displayed.    Estimated Creatinine Clearance: 101 mL/min (by C-G formula based on SCr of 0.85 mg/dL).   Medical History: Past Medical History:  Diagnosis Date   Diabetes mellitus (HCC)    Diabetes mellitus without complication (HCC)    Phreesia 11/10/2019   Hyperlipidemia    Assessment: 67 yoM presented to ED with chest pain found to have NSTEMI. Troponin elevated with plans for cardiac cath. No anticoagulation prior to admission. Pharmacy consulted to dose heparin  for ACS.  Heparin  level= 0.47, now therapeutic after heparin  rate increased to 1300 units/hr. No bleeding reported or issues with gtt running continuously per RN  AM: heparin  level is therapeutic at 0.55 on heparin  1300 units/hr. No issues with the infusion or bleeding reported.  Goal of Therapy:  Heparin  level 0.3-0.7 units/ml Monitor platelets by anticoagulation protocol: Yes   Plan:  Continue Heparin  infusion at 1300 units/hr Daily heparin  level, CBC CABG 6/23  Donny Alert, PharmD, [facility] Clinical Pharmacist Please see AMION for all Pharmacists' Contact Phone Numbers 11/07/2023, 7:05 AM

## 2023-11-08 ENCOUNTER — Inpatient Hospital Stay (HOSPITAL_COMMUNITY): Payer: Self-pay

## 2023-11-08 ENCOUNTER — Encounter (HOSPITAL_COMMUNITY): Payer: Self-pay | Admitting: Student in an Organized Health Care Education/Training Program

## 2023-11-08 ENCOUNTER — Encounter (HOSPITAL_COMMUNITY)
Admission: EM | Disposition: A | Discharge status: Home / Self Care | Payer: Self-pay | Source: Home / Self Care | Attending: Internal Medicine

## 2023-11-08 ENCOUNTER — Inpatient Hospital Stay (HOSPITAL_COMMUNITY)

## 2023-11-08 ENCOUNTER — Other Ambulatory Visit: Payer: Self-pay

## 2023-11-08 DIAGNOSIS — E1165 Type 2 diabetes mellitus with hyperglycemia: Secondary | ICD-10-CM | POA: Diagnosis not present

## 2023-11-08 DIAGNOSIS — I214 Non-ST elevation (NSTEMI) myocardial infarction: Secondary | ICD-10-CM

## 2023-11-08 DIAGNOSIS — I2510 Atherosclerotic heart disease of native coronary artery without angina pectoris: Secondary | ICD-10-CM

## 2023-11-08 DIAGNOSIS — Z951 Presence of aortocoronary bypass graft: Secondary | ICD-10-CM

## 2023-11-08 DIAGNOSIS — E785 Hyperlipidemia, unspecified: Secondary | ICD-10-CM | POA: Diagnosis not present

## 2023-11-08 HISTORY — PX: CORONARY ARTERY BYPASS GRAFT: SHX141

## 2023-11-08 HISTORY — PX: RADIAL ARTERY HARVEST: SHX5067

## 2023-11-08 HISTORY — PX: INTRAOPERATIVE TRANSESOPHAGEAL ECHOCARDIOGRAM: SHX5062

## 2023-11-08 LAB — HEPARIN LEVEL (UNFRACTIONATED): Heparin Unfractionated: 0.56 [IU]/mL (ref 0.30–0.70)

## 2023-11-08 LAB — PROTIME-INR
INR: 1 (ref 0.8–1.2)
INR: 1.5 — ABNORMAL HIGH (ref 0.8–1.2)
Prothrombin Time: 13.7 s (ref 11.4–15.2)
Prothrombin Time: 18.2 s — ABNORMAL HIGH (ref 11.4–15.2)

## 2023-11-08 LAB — POCT I-STAT EG7
Acid-Base Excess: 0 mmol/L (ref 0.0–2.0)
Bicarbonate: 24.4 mmol/L (ref 20.0–28.0)
Calcium, Ion: 1.06 mmol/L — ABNORMAL LOW (ref 1.15–1.40)
HCT: 26 % — ABNORMAL LOW (ref 39.0–52.0)
Hemoglobin: 8.8 g/dL — ABNORMAL LOW (ref 13.0–17.0)
O2 Saturation: 78 %
Potassium: 3.6 mmol/L (ref 3.5–5.1)
Sodium: 140 mmol/L (ref 135–145)
TCO2: 26 mmol/L (ref 22–32)
pCO2, Ven: 39.8 mmHg — ABNORMAL LOW (ref 44–60)
pH, Ven: 7.394 (ref 7.25–7.43)
pO2, Ven: 43 mmHg (ref 32–45)

## 2023-11-08 LAB — POCT I-STAT 7, (LYTES, BLD GAS, ICA,H+H)
Acid-base deficit: 3 mmol/L — ABNORMAL HIGH (ref 0.0–2.0)
Acid-base deficit: 2 mmol/L (ref 0.0–2.0)
Acid-base deficit: 1 mmol/L (ref 0.0–2.0)
Acid-base deficit: 2 mmol/L (ref 0.0–2.0)
Acid-base deficit: 3 mmol/L — ABNORMAL HIGH (ref 0.0–2.0)
Acid-base deficit: 6 mmol/L — ABNORMAL HIGH (ref 0.0–2.0)
Bicarbonate: 22.2 mmol/L (ref 20.0–28.0)
Bicarbonate: 22.6 mmol/L (ref 20.0–28.0)
Bicarbonate: 22.5 mmol/L (ref 20.0–28.0)
Bicarbonate: 21.6 mmol/L (ref 20.0–28.0)
Bicarbonate: 21.1 mmol/L (ref 20.0–28.0)
Bicarbonate: 20 mmol/L (ref 20.0–28.0)
Calcium, Ion: 1.22 mmol/L (ref 1.15–1.40)
Calcium, Ion: 0.97 mmol/L — ABNORMAL LOW (ref 1.15–1.40)
Calcium, Ion: 1.07 mmol/L — ABNORMAL LOW (ref 1.15–1.40)
Calcium, Ion: 1.05 mmol/L — ABNORMAL LOW (ref 1.15–1.40)
Calcium, Ion: 1.08 mmol/L — ABNORMAL LOW (ref 1.15–1.40)
Calcium, Ion: 1.15 mmol/L (ref 1.15–1.40)
HCT: 36 % — ABNORMAL LOW (ref 39.0–52.0)
HCT: 25 % — ABNORMAL LOW (ref 39.0–52.0)
HCT: 26 % — ABNORMAL LOW (ref 39.0–52.0)
HCT: 26 % — ABNORMAL LOW (ref 39.0–52.0)
HCT: 25 % — ABNORMAL LOW (ref 39.0–52.0)
HCT: 28 % — ABNORMAL LOW (ref 39.0–52.0)
Hemoglobin: 12.2 g/dL — ABNORMAL LOW (ref 13.0–17.0)
Hemoglobin: 8.5 g/dL — ABNORMAL LOW (ref 13.0–17.0)
Hemoglobin: 8.8 g/dL — ABNORMAL LOW (ref 13.0–17.0)
Hemoglobin: 8.8 g/dL — ABNORMAL LOW (ref 13.0–17.0)
Hemoglobin: 8.5 g/dL — ABNORMAL LOW (ref 13.0–17.0)
Hemoglobin: 9.5 g/dL — ABNORMAL LOW (ref 13.0–17.0)
O2 Saturation: 100 %
O2 Saturation: 100 %
O2 Saturation: 100 %
O2 Saturation: 100 %
O2 Saturation: 100 %
O2 Saturation: 99 %
Patient temperature: 35.9
Patient temperature: 37.4
Potassium: 4 mmol/L (ref 3.5–5.1)
Potassium: 4.2 mmol/L (ref 3.5–5.1)
Potassium: 5 mmol/L (ref 3.5–5.1)
Potassium: 4.2 mmol/L (ref 3.5–5.1)
Potassium: 3.9 mmol/L (ref 3.5–5.1)
Potassium: 4.8 mmol/L (ref 3.5–5.1)
Sodium: 135 mmol/L (ref 135–145)
Sodium: 138 mmol/L (ref 135–145)
Sodium: 136 mmol/L (ref 135–145)
Sodium: 138 mmol/L (ref 135–145)
Sodium: 139 mmol/L (ref 135–145)
Sodium: 139 mmol/L (ref 135–145)
TCO2: 23 mmol/L (ref 22–32)
TCO2: 24 mmol/L (ref 22–32)
TCO2: 23 mmol/L (ref 22–32)
TCO2: 23 mmol/L (ref 22–32)
TCO2: 22 mmol/L (ref 22–32)
TCO2: 21 mmol/L — ABNORMAL LOW (ref 22–32)
pCO2 arterial: 38.7 mmHg (ref 32–48)
pCO2 arterial: 36.5 mmHg (ref 32–48)
pCO2 arterial: 32.7 mmHg (ref 32–48)
pCO2 arterial: 32.5 mmHg (ref 32–48)
pCO2 arterial: 32.2 mmHg (ref 32–48)
pCO2 arterial: 42.9 mmHg (ref 32–48)
pH, Arterial: 7.367 (ref 7.35–7.45)
pH, Arterial: 7.399 (ref 7.35–7.45)
pH, Arterial: 7.446 (ref 7.35–7.45)
pH, Arterial: 7.43 (ref 7.35–7.45)
pH, Arterial: 7.42 (ref 7.35–7.45)
pH, Arterial: 7.28 — ABNORMAL LOW (ref 7.35–7.45)
pO2, Arterial: 585 mmHg — ABNORMAL HIGH (ref 83–108)
pO2, Arterial: 428 mmHg — ABNORMAL HIGH (ref 83–108)
pO2, Arterial: 403 mmHg — ABNORMAL HIGH (ref 83–108)
pO2, Arterial: 498 mmHg — ABNORMAL HIGH (ref 83–108)
pO2, Arterial: 178 mmHg — ABNORMAL HIGH (ref 83–108)
pO2, Arterial: 169 mmHg — ABNORMAL HIGH (ref 83–108)

## 2023-11-08 LAB — URINALYSIS, ROUTINE W REFLEX MICROSCOPIC
Bilirubin Urine: NEGATIVE
Glucose, UA: NEGATIVE mg/dL
Hgb urine dipstick: NEGATIVE
Ketones, ur: NEGATIVE mg/dL
Leukocytes,Ua: NEGATIVE
Nitrite: NEGATIVE
Protein, ur: NEGATIVE mg/dL
Specific Gravity, Urine: 1.004 — ABNORMAL LOW (ref 1.005–1.030)
pH: 6 (ref 5.0–8.0)

## 2023-11-08 LAB — POCT I-STAT, CHEM 8
BUN: 15 mg/dL (ref 6–20)
BUN: 12 mg/dL (ref 6–20)
BUN: 12 mg/dL (ref 6–20)
BUN: 12 mg/dL (ref 6–20)
Calcium, Ion: 1.24 mmol/L (ref 1.15–1.40)
Calcium, Ion: 1.16 mmol/L (ref 1.15–1.40)
Calcium, Ion: 1.06 mmol/L — ABNORMAL LOW (ref 1.15–1.40)
Calcium, Ion: 1.06 mmol/L — ABNORMAL LOW (ref 1.15–1.40)
Chloride: 101 mmol/L (ref 98–111)
Chloride: 105 mmol/L (ref 98–111)
Chloride: 102 mmol/L (ref 98–111)
Chloride: 104 mmol/L (ref 98–111)
Creatinine, Ser: 0.7 mg/dL (ref 0.61–1.24)
Creatinine, Ser: 0.6 mg/dL — ABNORMAL LOW (ref 0.61–1.24)
Creatinine, Ser: 0.6 mg/dL — ABNORMAL LOW (ref 0.61–1.24)
Creatinine, Ser: 0.6 mg/dL — ABNORMAL LOW (ref 0.61–1.24)
Glucose, Bld: 139 mg/dL — ABNORMAL HIGH (ref 70–99)
Glucose, Bld: 100 mg/dL — ABNORMAL HIGH (ref 70–99)
Glucose, Bld: 86 mg/dL (ref 70–99)
Glucose, Bld: 131 mg/dL — ABNORMAL HIGH (ref 70–99)
HCT: 36 % — ABNORMAL LOW (ref 39.0–52.0)
HCT: 31 % — ABNORMAL LOW (ref 39.0–52.0)
HCT: 25 % — ABNORMAL LOW (ref 39.0–52.0)
HCT: 26 % — ABNORMAL LOW (ref 39.0–52.0)
Hemoglobin: 12.2 g/dL — ABNORMAL LOW (ref 13.0–17.0)
Hemoglobin: 10.5 g/dL — ABNORMAL LOW (ref 13.0–17.0)
Hemoglobin: 8.5 g/dL — ABNORMAL LOW (ref 13.0–17.0)
Hemoglobin: 8.8 g/dL — ABNORMAL LOW (ref 13.0–17.0)
Potassium: 4 mmol/L (ref 3.5–5.1)
Potassium: 3.6 mmol/L (ref 3.5–5.1)
Potassium: 5.8 mmol/L — ABNORMAL HIGH (ref 3.5–5.1)
Potassium: 4.2 mmol/L (ref 3.5–5.1)
Sodium: 136 mmol/L (ref 135–145)
Sodium: 138 mmol/L (ref 135–145)
Sodium: 134 mmol/L — ABNORMAL LOW (ref 135–145)
Sodium: 138 mmol/L (ref 135–145)
TCO2: 27 mmol/L (ref 22–32)
TCO2: 23 mmol/L (ref 22–32)
TCO2: 24 mmol/L (ref 22–32)
TCO2: 25 mmol/L (ref 22–32)

## 2023-11-08 LAB — BASIC METABOLIC PANEL WITH GFR
Anion gap: 6 (ref 5–15)
BUN: 15 mg/dL (ref 6–20)
CO2: 26 mmol/L (ref 22–32)
Calcium: 9.1 mg/dL (ref 8.9–10.3)
Chloride: 100 mmol/L (ref 98–111)
Creatinine, Ser: 0.9 mg/dL (ref 0.61–1.24)
GFR, Estimated: >60 mL/min (ref >60)
Glucose, Bld: 177 mg/dL — ABNORMAL HIGH (ref 70–99)
Potassium: 4.9 mmol/L (ref 3.5–5.1)
Sodium: 132 mmol/L — ABNORMAL LOW (ref 135–145)

## 2023-11-08 LAB — CBC
HCT: 41.4 % (ref 39.0–52.0)
HCT: 27.5 % — ABNORMAL LOW (ref 39.0–52.0)
Hemoglobin: 13.7 g/dL (ref 13.0–17.0)
Hemoglobin: 9.1 g/dL — ABNORMAL LOW (ref 13.0–17.0)
MCH: 28.6 pg (ref 26.0–34.0)
MCH: 28.4 pg (ref 26.0–34.0)
MCHC: 33.1 g/dL (ref 30.0–36.0)
MCHC: 33.1 g/dL (ref 30.0–36.0)
MCV: 86.4 fL (ref 80.0–100.0)
MCV: 85.9 fL (ref 80.0–100.0)
Platelets: 201 10*3/uL (ref 150–400)
Platelets: 100 10*3/uL — ABNORMAL LOW (ref 150–400)
RBC: 4.79 MIL/uL (ref 4.22–5.81)
RBC: 3.2 MIL/uL — ABNORMAL LOW (ref 4.22–5.81)
RDW: 12.6 % (ref 11.5–15.5)
RDW: 12.6 % (ref 11.5–15.5)
WBC: 3.7 10*3/uL — ABNORMAL LOW (ref 4.0–10.5)
WBC: 5.4 10*3/uL (ref 4.0–10.5)
nRBC: 0 % (ref 0.0–0.2)
nRBC: 0 % (ref 0.0–0.2)

## 2023-11-08 LAB — GLUCOSE, CAPILLARY
Glucose-Capillary: 145 mg/dL — ABNORMAL HIGH (ref 70–99)
Glucose-Capillary: 139 mg/dL — ABNORMAL HIGH (ref 70–99)
Glucose-Capillary: 112 mg/dL — ABNORMAL HIGH (ref 70–99)
Glucose-Capillary: 126 mg/dL — ABNORMAL HIGH (ref 70–99)
Glucose-Capillary: 107 mg/dL — ABNORMAL HIGH (ref 70–99)
Glucose-Capillary: 136 mg/dL — ABNORMAL HIGH (ref 70–99)
Glucose-Capillary: 130 mg/dL — ABNORMAL HIGH (ref 70–99)
Glucose-Capillary: 145 mg/dL — ABNORMAL HIGH (ref 70–99)
Glucose-Capillary: 139 mg/dL — ABNORMAL HIGH (ref 70–99)

## 2023-11-08 LAB — VAS US DOPPLER PRE CABG

## 2023-11-08 LAB — SURGICAL PCR SCREEN
MRSA, PCR: NEGATIVE
Staphylococcus aureus: POSITIVE — AB

## 2023-11-08 LAB — HEMOGLOBIN AND HEMATOCRIT, BLOOD
HCT: 28 % — ABNORMAL LOW (ref 39.0–52.0)
Hemoglobin: 9.3 g/dL — ABNORMAL LOW (ref 13.0–17.0)

## 2023-11-08 LAB — PLATELET COUNT: Platelets: 156 10*3/uL (ref 150–400)

## 2023-11-08 LAB — ECHO INTRAOPERATIVE TEE
Height: 70 in
Weight: 2627.88 [oz_av]

## 2023-11-08 LAB — APTT
aPTT: 67 s — ABNORMAL HIGH (ref 24–36)
aPTT: 30 s (ref 24–36)

## 2023-11-08 SURGERY — CORONARY ARTERY BYPASS GRAFTING (CABG)
Anesthesia: General | Site: Chest

## 2023-11-08 MED ORDER — DOCUSATE SODIUM 100 MG PO CAPS
200.0000 mg | ORAL_CAPSULE | Freq: Every day | ORAL | Status: DC
  Administered 2023-11-09 – 2023-11-13 (×5): 200 mg via ORAL
  Filled 2023-11-08 (×5): qty 2

## 2023-11-08 MED ORDER — INSULIN REGULAR(HUMAN) IN NACL 100-0.9 UT/100ML-% IV SOLN: INTRAVENOUS | Status: DC

## 2023-11-08 MED ORDER — SODIUM CHLORIDE 0.9 % IV SOLN: 250.0000 mL | INTRAVENOUS | Status: DC

## 2023-11-08 MED ORDER — SODIUM CHLORIDE 0.9% FLUSH
3.0000 mL | Freq: Two times a day (BID) | INTRAVENOUS | Status: DC
  Administered 2023-11-09 – 2023-11-10 (×3): 3 mL via INTRAVENOUS

## 2023-11-08 MED ORDER — ACETAMINOPHEN 160 MG/5ML PO SOLN
1000.0000 mg | Freq: Four times a day (QID) | ORAL | Status: DC
Start: 2023-11-09 — End: 2023-11-13
  Filled 2023-11-08: qty 40.6

## 2023-11-08 MED ORDER — LACTATED RINGERS IV SOLN: INTRAVENOUS | Status: AC

## 2023-11-08 MED ORDER — ACETAMINOPHEN 500 MG PO TABS
1000.0000 mg | ORAL_TABLET | Freq: Four times a day (QID) | ORAL | Status: DC
  Administered 2023-11-08 – 2023-11-13 (×18): 1000 mg via ORAL
  Filled 2023-11-08 (×18): qty 2

## 2023-11-08 MED ORDER — ALBUMIN HUMAN 5 % IV SOLN
250.0000 mL | INTRAVENOUS | Status: DC | PRN
  Administered 2023-11-08 (×3): 12.5 g via INTRAVENOUS
  Filled 2023-11-08: qty 250

## 2023-11-08 MED ORDER — BISACODYL 10 MG RE SUPP
10.0000 mg | Freq: Every day | RECTAL | Status: DC
  Filled 2023-11-08: qty 1

## 2023-11-08 MED ORDER — MAGNESIUM SULFATE 4 GM/100ML IV SOLN
4.0000 g | Freq: Once | INTRAVENOUS | Status: AC
  Administered 2023-11-08: 4 g via INTRAVENOUS
  Filled 2023-11-08: qty 100

## 2023-11-08 MED ORDER — HEPARIN SODIUM (PORCINE) 1000 UNIT/ML IJ SOLN
INTRAMUSCULAR | Status: DC | PRN
  Administered 2023-11-08: 28000 [IU] via INTRAVENOUS
  Administered 2023-11-08: 2000 [IU] via INTRAVENOUS

## 2023-11-08 MED ORDER — PHENYLEPHRINE HCL (PRESSORS) 10 MG/ML IV SOLN
INTRAVENOUS | Status: DC | PRN
  Administered 2023-11-08: 20 ug via INTRAVENOUS
  Administered 2023-11-08: 160 ug via INTRAVENOUS

## 2023-11-08 MED ORDER — VANCOMYCIN HCL IN DEXTROSE 1-5 GM/200ML-% IV SOLN
1000.0000 mg | Freq: Once | INTRAVENOUS | Status: AC
  Administered 2023-11-08: 1000 mg via INTRAVENOUS
  Filled 2023-11-08: qty 200

## 2023-11-08 MED ORDER — ROCURONIUM BROMIDE 10 MG/ML (PF) SYRINGE
PREFILLED_SYRINGE | INTRAVENOUS | Status: AC
Start: 2023-11-08 — End: 2023-11-08
  Filled 2023-11-08: qty 30

## 2023-11-08 MED ORDER — FENTANYL CITRATE (PF) 250 MCG/5ML IJ SOLN
INTRAMUSCULAR | Status: AC
  Filled 2023-11-08: qty 5

## 2023-11-08 MED ORDER — CHLORHEXIDINE GLUCONATE 0.12 % MT SOLN
15.0000 mL | OROMUCOSAL | Status: AC
  Administered 2023-11-08: 15 mL via OROMUCOSAL
  Filled 2023-11-08: qty 15

## 2023-11-08 MED ORDER — POTASSIUM CHLORIDE 10 MEQ/50ML IV SOLN
10.0000 meq | INTRAVENOUS | Status: AC
  Administered 2023-11-08 (×3): 10 meq via INTRAVENOUS

## 2023-11-08 MED ORDER — METOPROLOL TARTRATE 12.5 MG HALF TABLET
12.5000 mg | ORAL_TABLET | Freq: Two times a day (BID) | ORAL | Status: DC
  Administered 2023-11-09 – 2023-11-13 (×8): 12.5 mg via ORAL
  Filled 2023-11-08 (×8): qty 1

## 2023-11-08 MED ORDER — ORAL CARE MOUTH RINSE: 15.0000 mL | Freq: Once | OROMUCOSAL | Status: DC

## 2023-11-08 MED ORDER — METOCLOPRAMIDE HCL 5 MG/ML IJ SOLN
10.0000 mg | Freq: Four times a day (QID) | INTRAMUSCULAR | Status: AC
  Administered 2023-11-08 – 2023-11-10 (×6): 10 mg via INTRAVENOUS
  Filled 2023-11-08 (×6): qty 2

## 2023-11-08 MED ORDER — PROTAMINE SULFATE 10 MG/ML IV SOLN
INTRAVENOUS | Status: AC
  Filled 2023-11-08: qty 5

## 2023-11-08 MED ORDER — HEMOSTATIC AGENTS (NO CHARGE) OPTIME: TOPICAL | Status: DC | PRN

## 2023-11-08 MED ORDER — LACTATED RINGERS IV SOLN
INTRAVENOUS | Status: DC | PRN
Start: 2023-11-08 — End: 2023-11-08

## 2023-11-08 MED ORDER — ASPIRIN 81 MG PO CHEW: 324.0000 mg | CHEWABLE_TABLET | Freq: Every day | ORAL | Status: DC

## 2023-11-08 MED ORDER — METOPROLOL TARTRATE 25 MG/10 ML ORAL SUSPENSION: 12.5000 mg | Freq: Two times a day (BID) | ORAL | Status: DC

## 2023-11-08 MED ORDER — PANTOPRAZOLE SODIUM 40 MG IV SOLR
40.0000 mg | Freq: Every day | INTRAVENOUS | Status: AC
  Administered 2023-11-08 – 2023-11-09 (×2): 40 mg via INTRAVENOUS
  Filled 2023-11-08 (×2): qty 10

## 2023-11-08 MED ORDER — SODIUM CHLORIDE 0.9% FLUSH: 3.0000 mL | INTRAVENOUS | Status: DC | PRN

## 2023-11-08 MED ORDER — PHENYLEPHRINE HCL-NACL 20-0.9 MG/250ML-% IV SOLN
0.0000 ug/min | INTRAVENOUS | Status: DC
  Administered 2023-11-09: 40 ug/min via INTRAVENOUS
  Administered 2023-11-09: 50 ug/min via INTRAVENOUS
  Filled 2023-11-08 (×2): qty 250

## 2023-11-08 MED ORDER — PLASMA-LYTE A IV SOLN: INTRAVENOUS | Status: DC | PRN

## 2023-11-08 MED ORDER — MUPIROCIN 2 % EX OINT
1.0000 | TOPICAL_OINTMENT | Freq: Two times a day (BID) | CUTANEOUS | Status: AC
  Administered 2023-11-08 – 2023-11-12 (×10): 1 via NASAL
  Filled 2023-11-08 (×6): qty 22

## 2023-11-08 MED ORDER — ACETAMINOPHEN 160 MG/5ML PO SOLN
650.0000 mg | Freq: Once | ORAL | Status: AC
  Administered 2023-11-08: 650 mg
  Filled 2023-11-08: qty 20.3

## 2023-11-08 MED ORDER — SODIUM BICARBONATE 8.4 % IV SOLN
INTRAVENOUS | Status: AC
  Filled 2023-11-08: qty 50

## 2023-11-08 MED ORDER — DEXMEDETOMIDINE HCL IN NACL 400 MCG/100ML IV SOLN
0.0000 ug/kg/h | INTRAVENOUS | Status: DC
  Administered 2023-11-08: 0.2 ug/kg/h via INTRAVENOUS
  Filled 2023-11-08: qty 100

## 2023-11-08 MED ORDER — PHENYLEPHRINE HCL-NACL 20-0.9 MG/250ML-% IV SOLN
INTRAVENOUS | Status: DC | PRN
  Administered 2023-11-08: 30 ug/min via INTRAVENOUS

## 2023-11-08 MED ORDER — HEMOSTATIC AGENTS (NO CHARGE) OPTIME
TOPICAL | Status: DC | PRN
  Administered 2023-11-08: 1 via TOPICAL

## 2023-11-08 MED ORDER — PROPOFOL 10 MG/ML IV BOLUS
INTRAVENOUS | Status: AC
  Filled 2023-11-08: qty 20

## 2023-11-08 MED ORDER — FENTANYL CITRATE (PF) 100 MCG/2ML IJ SOLN
INTRAMUSCULAR | Status: DC | PRN
  Administered 2023-11-08: 150 ug via INTRAVENOUS
  Administered 2023-11-08 (×2): 100 ug via INTRAVENOUS
  Administered 2023-11-08: 150 ug via INTRAVENOUS
  Administered 2023-11-08 (×2): 100 ug via INTRAVENOUS
  Administered 2023-11-08: 200 ug via INTRAVENOUS
  Administered 2023-11-08: 100 ug via INTRAVENOUS
  Administered 2023-11-08: 50 ug via INTRAVENOUS
  Administered 2023-11-08 (×2): 100 ug via INTRAVENOUS

## 2023-11-08 MED ORDER — DEXTROSE 50 % IV SOLN: 0.0000 mL | INTRAVENOUS | Status: DC | PRN

## 2023-11-08 MED ORDER — MORPHINE SULFATE (PF) 2 MG/ML IV SOLN
1.0000 mg | INTRAVENOUS | Status: DC | PRN
  Administered 2023-11-09: 4 mg via INTRAVENOUS
  Administered 2023-11-09: 2 mg via INTRAVENOUS
  Filled 2023-11-08: qty 1
  Filled 2023-11-08: qty 2

## 2023-11-08 MED ORDER — ROCURONIUM BROMIDE 100 MG/10ML IV SOLN
INTRAVENOUS | Status: DC | PRN
  Administered 2023-11-08: 50 mg via INTRAVENOUS
  Administered 2023-11-08: 30 mg via INTRAVENOUS
  Administered 2023-11-08: 100 mg via INTRAVENOUS
  Administered 2023-11-08: 50 mg via INTRAVENOUS

## 2023-11-08 MED ORDER — AMLODIPINE BESYLATE 5 MG PO TABS
5.0000 mg | ORAL_TABLET | Freq: Every day | ORAL | Status: DC
  Administered 2023-11-09 – 2023-11-13 (×4): 5 mg via ORAL
  Filled 2023-11-08 (×4): qty 1

## 2023-11-08 MED ORDER — SODIUM CHLORIDE 0.9% FLUSH
3.0000 mL | Freq: Two times a day (BID) | INTRAVENOUS | Status: DC
  Administered 2023-11-08 – 2023-11-09 (×3): 10 mL via INTRAVENOUS

## 2023-11-08 MED ORDER — PHENYLEPHRINE 80 MCG/ML (10ML) SYRINGE FOR IV PUSH (FOR BLOOD PRESSURE SUPPORT)
PREFILLED_SYRINGE | INTRAVENOUS | Status: AC
  Filled 2023-11-08: qty 10

## 2023-11-08 MED ORDER — ONDANSETRON HCL 4 MG/2ML IJ SOLN
4.0000 mg | Freq: Four times a day (QID) | INTRAMUSCULAR | Status: DC | PRN
  Administered 2023-11-09 – 2023-11-10 (×2): 4 mg via INTRAVENOUS
  Filled 2023-11-08 (×2): qty 2

## 2023-11-08 MED ORDER — PANTOPRAZOLE SODIUM 40 MG PO TBEC
40.0000 mg | DELAYED_RELEASE_TABLET | Freq: Every day | ORAL | Status: DC
  Administered 2023-11-10 – 2023-11-13 (×4): 40 mg via ORAL
  Filled 2023-11-08 (×4): qty 1

## 2023-11-08 MED ORDER — FENTANYL CITRATE (PF) 250 MCG/5ML IJ SOLN
INTRAMUSCULAR | Status: AC
Start: 2023-11-08 — End: 2023-11-08
  Filled 2023-11-08: qty 5

## 2023-11-08 MED ORDER — CHLORHEXIDINE GLUCONATE 0.12 % MT SOLN: 15.0000 mL | Freq: Once | OROMUCOSAL | Status: DC

## 2023-11-08 MED ORDER — SODIUM BICARBONATE 8.4 % IV SOLN
50.0000 meq | Freq: Once | INTRAVENOUS | Status: AC
  Administered 2023-11-08: 50 meq via INTRAVENOUS

## 2023-11-08 MED ORDER — SODIUM CHLORIDE 0.9 % IV SOLN: INTRAVENOUS | Status: DC | PRN

## 2023-11-08 MED ORDER — METOPROLOL TARTRATE 5 MG/5ML IV SOLN: 2.5000 mg | INTRAVENOUS | Status: DC | PRN

## 2023-11-08 MED ORDER — MIDAZOLAM HCL (PF) 10 MG/2ML IJ SOLN
INTRAMUSCULAR | Status: AC
  Filled 2023-11-08: qty 2

## 2023-11-08 MED ORDER — LACTATED RINGERS IV SOLN: INTRAVENOUS | Status: DC | PRN

## 2023-11-08 MED ORDER — ALBUMIN HUMAN 5 % IV SOLN: INTRAVENOUS | Status: DC | PRN

## 2023-11-08 MED ORDER — LACTATED RINGERS IV SOLN
INTRAVENOUS | Status: AC
Start: 2023-11-08 — End: 2023-11-09

## 2023-11-08 MED ORDER — NITROGLYCERIN IN D5W 200-5 MCG/ML-% IV SOLN: 7.0000 ug/min | INTRAVENOUS | Status: AC

## 2023-11-08 MED ORDER — CEFAZOLIN SODIUM-DEXTROSE 2-4 GM/100ML-% IV SOLN
2.0000 g | Freq: Three times a day (TID) | INTRAVENOUS | Status: DC
  Administered 2023-11-08 – 2023-11-10 (×5): 2 g via INTRAVENOUS
  Filled 2023-11-08 (×5): qty 100

## 2023-11-08 MED ORDER — PROPOFOL 10 MG/ML IV BOLUS
INTRAVENOUS | Status: DC | PRN
  Administered 2023-11-08: 50 mg via INTRAVENOUS

## 2023-11-08 MED ORDER — TRAMADOL HCL 50 MG PO TABS
50.0000 mg | ORAL_TABLET | ORAL | Status: DC | PRN
  Administered 2023-11-09 – 2023-11-10 (×4): 100 mg via ORAL
  Filled 2023-11-08 (×4): qty 2

## 2023-11-08 MED ORDER — BISACODYL 5 MG PO TBEC
10.0000 mg | DELAYED_RELEASE_TABLET | Freq: Every day | ORAL | Status: DC
  Administered 2023-11-09 – 2023-11-13 (×4): 10 mg via ORAL
  Filled 2023-11-08 (×5): qty 2

## 2023-11-08 MED ORDER — PROTAMINE SULFATE 10 MG/ML IV SOLN
INTRAVENOUS | Status: AC
  Filled 2023-11-08: qty 25

## 2023-11-08 MED ORDER — HEPARIN SODIUM (PORCINE) 1000 UNIT/ML IJ SOLN
INTRAMUSCULAR | Status: AC
  Filled 2023-11-08: qty 1

## 2023-11-08 MED ORDER — MIDAZOLAM HCL 2 MG/2ML IJ SOLN: 2.0000 mg | INTRAMUSCULAR | Status: DC | PRN

## 2023-11-08 MED ORDER — LACTATED RINGERS IV SOLN: INTRAVENOUS | Status: DC

## 2023-11-08 MED ORDER — PROTAMINE SULFATE 10 MG/ML IV SOLN
INTRAVENOUS | Status: DC | PRN
  Administered 2023-11-08: 300 mg via INTRAVENOUS

## 2023-11-08 MED ORDER — ASPIRIN 325 MG PO TBEC
325.0000 mg | DELAYED_RELEASE_TABLET | Freq: Every day | ORAL | Status: DC
  Administered 2023-11-09 – 2023-11-10 (×2): 325 mg via ORAL
  Filled 2023-11-08 (×2): qty 1

## 2023-11-08 MED ORDER — MIDAZOLAM HCL (PF) 5 MG/ML IJ SOLN
INTRAMUSCULAR | Status: DC | PRN
  Administered 2023-11-08 (×2): 2 mg via INTRAVENOUS
  Administered 2023-11-08: 1 mg via INTRAVENOUS
  Administered 2023-11-08: 3 mg via INTRAVENOUS
  Administered 2023-11-08: 2 mg via INTRAVENOUS

## 2023-11-08 MED ORDER — ASPIRIN 81 MG PO CHEW
324.0000 mg | CHEWABLE_TABLET | Freq: Once | ORAL | Status: AC
  Administered 2023-11-08: 324 mg via ORAL
  Filled 2023-11-08: qty 4

## 2023-11-08 MED ORDER — SODIUM CHLORIDE (PF) 0.9 % IJ SOLN: OROMUCOSAL | Status: DC | PRN

## 2023-11-08 MED ORDER — SODIUM CHLORIDE 0.45 % IV SOLN
INTRAVENOUS | Status: AC | PRN
Start: 2023-11-08 — End: 2023-11-09

## 2023-11-08 MED ORDER — OXYCODONE HCL 5 MG PO TABS
5.0000 mg | ORAL_TABLET | ORAL | Status: DC | PRN
  Administered 2023-11-08 – 2023-11-10 (×8): 10 mg via ORAL
  Administered 2023-11-10: 5 mg via ORAL
  Administered 2023-11-11 (×3): 10 mg via ORAL
  Administered 2023-11-11 – 2023-11-12 (×4): 5 mg via ORAL
  Filled 2023-11-08: qty 1
  Filled 2023-11-08 (×7): qty 2
  Filled 2023-11-08 (×2): qty 1
  Filled 2023-11-08 (×3): qty 2
  Filled 2023-11-08: qty 1
  Filled 2023-11-08: qty 2
  Filled 2023-11-08: qty 1

## 2023-11-08 MED ORDER — 0.9 % SODIUM CHLORIDE (POUR BTL) OPTIME
TOPICAL | Status: DC | PRN
  Administered 2023-11-08: 5000 mL

## 2023-11-08 MED ORDER — CHLORHEXIDINE GLUCONATE CLOTH 2 % EX PADS: 6.0000 | MEDICATED_PAD | Freq: Every day | CUTANEOUS | Status: DC

## 2023-11-08 SURGICAL SUPPLY — 91 items
ADAPTER MULTI PERFUSION 15 (ADAPTER) ×3 IMPLANT
BAG DECANTER FOR FLEXI CONT (MISCELLANEOUS) ×3 IMPLANT
BLADE CLIPPER SURG (BLADE) ×3 IMPLANT
BLADE STERNUM SYSTEM 6 (BLADE) ×3 IMPLANT
BLADE SURG 15 STRL LF DISP TIS (BLADE) IMPLANT
BNDG ELASTIC 4INX 5YD STR LF (GAUZE/BANDAGES/DRESSINGS) IMPLANT
BNDG ELASTIC 6INX 5YD STR LF (GAUZE/BANDAGES/DRESSINGS) ×3 IMPLANT
BNDG GAUZE DERMACEA FLUFF 4 (GAUZE/BANDAGES/DRESSINGS) ×3 IMPLANT
CANISTER SUCTION 3000ML PPV (SUCTIONS) ×3 IMPLANT
CANNULA AORTIC ROOT 9FR (CANNULA) ×3 IMPLANT
CANNULA EZ GLIDE AORTIC 21FR (CANNULA) ×3 IMPLANT
CANNULA MC2 2 STG 36/46 CONN (CANNULA) IMPLANT
CANNULA VESSEL 3MM BLUNT TIP (CANNULA) ×9 IMPLANT
CATH ROBINSON RED A/P 18FR (CATHETERS) ×3 IMPLANT
CATH THORACIC 36FR (CATHETERS) ×3 IMPLANT
CATH THORACIC 36FR RT ANG (CATHETERS) ×3 IMPLANT
CLIP APPLIE 9.375 SM OPEN (CLIP) IMPLANT
CLIP TI MEDIUM 24 (CLIP) IMPLANT
CLIP TI WIDE RED SMALL 24 (CLIP) IMPLANT
CONTAINER PROTECT SURGISLUSH (MISCELLANEOUS) ×6 IMPLANT
COVER MAYO STAND STRL (DRAPES) IMPLANT
CUFF TOURN SGL QUICK 18X4 (TOURNIQUET CUFF) IMPLANT
CUFF TRNQT CYL 24X4X16.5-23 (TOURNIQUET CUFF) IMPLANT
DRAPE EXTREMITY T 121X128X90 (DISPOSABLE) ×3 IMPLANT
DRAPE HALF SHEET 40X57 (DRAPES) IMPLANT
DRAPE SRG 135X102X78XABS (DRAPES) ×3 IMPLANT
DRAPE WARM FLUID 44X44 (DRAPES) ×3 IMPLANT
DRSG COVADERM 4X14 (GAUZE/BANDAGES/DRESSINGS) ×3 IMPLANT
ELECTRODE REM PT RTRN 9FT ADLT (ELECTROSURGICAL) ×6 IMPLANT
FELT TEFLON 1X6 (MISCELLANEOUS) ×6 IMPLANT
GAUZE 4X4 16PLY ~~LOC~~+RFID DBL (SPONGE) ×3 IMPLANT
GAUZE SPONGE 4X4 12PLY STRL (GAUZE/BANDAGES/DRESSINGS) ×6 IMPLANT
GEL ULTRASOUND 20GR AQUASONIC (MISCELLANEOUS) IMPLANT
GLOVE SS BIOGEL STRL SZ 7.5 (GLOVE) ×3 IMPLANT
GOWN STRL REUS W/ TWL LRG LVL3 (GOWN DISPOSABLE) ×12 IMPLANT
GOWN STRL REUS W/ TWL XL LVL3 (GOWN DISPOSABLE) ×6 IMPLANT
HEMOSTAT POWDER SURGIFOAM 1G (HEMOSTASIS) ×9 IMPLANT
HEMOSTAT SURGICEL 2X14 (HEMOSTASIS) ×3 IMPLANT
INSERT FOGARTY XLG (MISCELLANEOUS) IMPLANT
KIT BASIN OR (CUSTOM PROCEDURE TRAY) ×3 IMPLANT
KIT SUCTION CATH 14FR (SUCTIONS) ×6 IMPLANT
KIT TURNOVER KIT B (KITS) ×3 IMPLANT
KIT VASOVIEW HEMOPRO 2 VH 4000 (KITS) ×3 IMPLANT
MARKER GRAFT CORONARY BYPASS (MISCELLANEOUS) ×9 IMPLANT
MARKER SKIN DUAL TIP RULER LAB (MISCELLANEOUS) IMPLANT
NS IRRIG 1000ML POUR BTL (IV SOLUTION) ×15 IMPLANT
PACK E OPEN HEART (SUTURE) ×3 IMPLANT
PACK OPEN HEART (CUSTOM PROCEDURE TRAY) ×3 IMPLANT
PAD ARMBOARD POSITIONER FOAM (MISCELLANEOUS) ×6 IMPLANT
PAD ELECT DEFIB RADIOL ZOLL (MISCELLANEOUS) ×3 IMPLANT
PENCIL BUTTON HOLSTER BLD 10FT (ELECTRODE) ×3 IMPLANT
POSITIONER HEAD DONUT 9IN (MISCELLANEOUS) ×3 IMPLANT
PUNCH AORTIC ROTATE 4.0MM (MISCELLANEOUS) IMPLANT
PUNCH AORTIC ROTATE 4.5MM 8IN (MISCELLANEOUS) IMPLANT
PUNCH AORTIC ROTATE 5MM 8IN (MISCELLANEOUS) IMPLANT
SET MPS 3-ND DEL (MISCELLANEOUS) IMPLANT
SHEARS HARMONIC 9CM CVD (BLADE) ×3 IMPLANT
SHEARS HARMONIC FOCUS 9 REPROC (ELECTROSURGICAL) IMPLANT
SPONGE T-LAP 18X18 ~~LOC~~+RFID (SPONGE) ×12 IMPLANT
SPONGE T-LAP 4X18 ~~LOC~~+RFID (SPONGE) ×3 IMPLANT
SUPPORT HEART JANKE-BARRON (MISCELLANEOUS) ×3 IMPLANT
SUT BONE WAX W31G (SUTURE) ×3 IMPLANT
SUT MNCRL AB 4-0 PS2 18 (SUTURE) IMPLANT
SUT PROLENE 3 0 SH DA (SUTURE) ×3 IMPLANT
SUT PROLENE 4 0 SH DA (SUTURE) IMPLANT
SUT PROLENE 4-0 RB1 .5 CRCL 36 (SUTURE) IMPLANT
SUT PROLENE 5 0 C 1 36 (SUTURE) IMPLANT
SUT PROLENE 6 0 C 1 30 (SUTURE) ×6 IMPLANT
SUT PROLENE 7 0 BV 1 (SUTURE) IMPLANT
SUT PROLENE 7 0 BV1 MDA (SUTURE) ×3 IMPLANT
SUT PROLENE 8 0 BV175 6 (SUTURE) IMPLANT
SUT STEEL 6MS V (SUTURE) ×3 IMPLANT
SUT STEEL STERNAL CCS#1 18IN (SUTURE) IMPLANT
SUT STEEL SZ 6 DBL 3X14 BALL (SUTURE) ×3 IMPLANT
SUT VIC AB 1 CTX36XBRD ANBCTR (SUTURE) ×6 IMPLANT
SUT VIC AB 2-0 CT1 TAPERPNT 27 (SUTURE) IMPLANT
SUT VIC AB 2-0 CTX 27 (SUTURE) IMPLANT
SUT VIC AB 3-0 SH 27X BRD (SUTURE) IMPLANT
SUT VIC AB 3-0 X1 27 (SUTURE) IMPLANT
SYR 50ML SLIP (SYRINGE) IMPLANT
SYSTEM SAHARA CHEST DRAIN ATS (WOUND CARE) ×3 IMPLANT
TAPE CLOTH SURG 4X10 WHT LF (GAUZE/BANDAGES/DRESSINGS) IMPLANT
TAPE PAPER 2X10 WHT MICROPORE (GAUZE/BANDAGES/DRESSINGS) IMPLANT
TOWEL GREEN STERILE (TOWEL DISPOSABLE) ×3 IMPLANT
TOWEL GREEN STERILE FF (TOWEL DISPOSABLE) ×3 IMPLANT
TRAY FOLEY SLVR 16FR TEMP STAT (SET/KITS/TRAYS/PACK) ×3 IMPLANT
TUBE SUCT INTRACARD DLP 20F (MISCELLANEOUS) ×3 IMPLANT
TUBE SUCTION CARDIAC 10FR (CANNULA) ×3 IMPLANT
TUBING LAP HI FLOW INSUFFLATIO (TUBING) ×3 IMPLANT
UNDERPAD 30X36 HEAVY ABSORB (UNDERPADS AND DIAPERS) ×3 IMPLANT
WATER STERILE IRR 1000ML POUR (IV SOLUTION) ×6 IMPLANT

## 2023-11-08 NOTE — Interval H&P Note (Signed)
 History and Physical Interval Note:  11/08/2023 10:51 AM  Marcus Hart  has presented today for surgery, with the diagnosis of CAD.  The various methods of treatment have been discussed with the patient and family. After consideration of risks, benefits and other options for treatment, the patient has consented to  Procedure(s): CORONARY ARTERY BYPASS GRAFTING (CABG) (N/A) ECHOCARDIOGRAM, TRANSESOPHAGEAL, INTRAOPERATIVE (N/A) SURGICAL PROCUREMENT, ARTERY, RADIAL (N/A) as a surgical intervention.  The patient's history has been reviewed, patient examined, no change in status, stable for surgery.  I have reviewed the patient's chart and labs.  Questions were answered to the patient's satisfaction.     [name]

## 2023-11-08 NOTE — Transfer of Care (Signed)
 Immediate Anesthesia Transfer of Care Note  Patient: Marcus Hart  Procedure(s) Performed: CORONARY ARTERY BYPASS GRAFTING X TWO USING LEFT INTERNAL MAMMARY ARTERY AND LEFT RADIAL ARTERY (Chest) ECHOCARDIOGRAM, TRANSESOPHAGEAL, INTRAOPERATIVE SURGICAL PROCUREMENT, ARTERY, RADIAL (Left)  Patient Location: ICU  Anesthesia Type:General  Level of Consciousness: sedated and Patient remains intubated per anesthesia plan  Airway & Oxygen Therapy: Patient remains intubated per anesthesia plan and Patient placed on Ventilator (see vital sign flow sheet for setting)  Post-op Assessment: Report given to RN and Post -op Vital signs reviewed and stable  Post vital signs: Reviewed and stable  Last Vitals:  Vitals Value Taken Time  BP 87/66   Temp    Pulse 88 11/08/23 17:23  Resp 16 11/08/23 17:23  SpO2 100 % 11/08/23 17:23  Vitals shown include unfiled device data.  Last Pain:  Vitals:   11/08/23 0937  TempSrc: Oral  PainSc:          Complications: No notable events documented.  Patient transported to ICU with standard monitors (HR, BP, SPO2, RR) and emergency drugs/equipment. Controlled ventilation maintained via ambu bag. Report given to bedside RN and respiratory therapist. Pt connected to ICU monitor and ventilator. All questions answered and vital signs stable before leaving

## 2023-11-08 NOTE — Progress Notes (Signed)
 Rapid wean initiated per protocol

## 2023-11-08 NOTE — Anesthesia Procedure Notes (Signed)
 Central Venous Catheter Insertion Performed by: Patrisha Bernardino SQUIBB, MD, anesthesiologist Start/End6/23/2025 10:40 AM, 11/08/2023 10:50 AM Patient location: Pre-op. Preanesthetic checklist: patient identified, IV checked, site marked, risks and benefits discussed, surgical consent, monitors and equipment checked, pre-op evaluation, timeout performed and anesthesia consent Position: supine Hand hygiene performed  and maximum sterile barriers used  PA cath was placed.Swan type:thermodilution PA Cath depth:45 Procedure performed without using ultrasound guided technique. Attempts: 1 Patient tolerated the procedure well with no immediate complications.

## 2023-11-08 NOTE — Anesthesia Procedure Notes (Signed)
 Procedure Name: Intubation Date/Time: 11/08/2023 12:25 PM  Performed by: Lamar Lucie DASEN, CRNAPre-anesthesia Checklist: Patient identified, Emergency Drugs available, Suction available and Patient being monitored Patient Re-evaluated:Patient Re-evaluated prior to induction Oxygen Delivery Method: Circle system utilized Preoxygenation: Pre-oxygenation with 100% oxygen Induction Type: IV induction Ventilation: Mask ventilation without difficulty Laryngoscope Size: Mac and 4 Grade View: Grade I Tube type: Oral Tube size: 8.0 mm Number of attempts: 1 Airway Equipment and Method: Stylet and Oral airway Placement Confirmation: ETT inserted through vocal cords under direct vision, positive ETCO2 and breath sounds checked- equal and bilateral Secured at: 22 cm Tube secured with: Tape Dental Injury: Teeth and Oropharynx as per pre-operative assessment

## 2023-11-08 NOTE — Op Note (Signed)
 Marcus Hart, Marcus Hart MEDICAL RECORD NO: [id number] ACCOUNT NO: [id number] DATE OF BIRTH: [birth year] FACILITY: [facility] LOCATION: [facility] PHYSICIAN: Elspeth BROCKS. Kerrin, MD  Operative Report   DATE OF PROCEDURE: 11/08/2023  PREOPERATIVE DIAGNOSIS:  Two-vessel coronary artery disease status post non-ST elevation MI.  POSTOPERATIVE DIAGNOSIS: Two-vessel coronary artery disease status post non-ST elevation MI.  PROCEDURE:  Median sternotomy, extracorporeal circulation,  Coronary artery bypass grafting x2  left internal mammary artery to LAD,  left radial artery to OM4  SURGEON:  Elspeth BROCKS. Kerrin, MD.  ASSISTANT:  Lemond Cera, PA.   Experienced assistance was necessary for this case due to surgical complexity.  Wayne Gold independently harvested the left radial artery and closed the arm incision.  He then assisted with exposure, retraction of delicate tissue, suture management, and suctioning during the anastomosis.  ANESTHESIA:  General.  FINDINGS:  Transesophageal echocardiography showed ejection fraction of 45-50%.  No significant valvular pathology.  Unchanged post bypass.  LAD intramyocardial.  OM2 too small to graft.  OM4 best quality target.  All vessels, both conduits and targets, relatively small for the patient's size.  CLINICAL NOTE:  The patient is a 58 year old gentleman who presented with exertional chest pain.  We ruled in for a non-ST elevation MI.  At catheterization, he was found to have significant disease in the LAD and circumflex with a left dominant circulation.  He was referred for coronary artery bypass grafting.  The indications, risks, benefits, and alternatives were discussed in detail with the patient.  He understood and accepted the risks and agreed to proceed.  OPERATIVE NOTE:  [name] was brought to the operating room on 11/08/2023.  He had induction of general anesthesia and was intubated.  Dr. Bernardino Mango performed transesophageal echocardiography.   Please see his separately dictated note for full details of the procedure.  Intravenous antibiotics were administered.  A Foley catheter was placed.  The chest, abdomen, and legs were prepped and draped in the usual sterile fashion, as was the left arm.  A timeout was performed.  An incision was made over the volar aspect of the left wrist.  A short segment of the radial artery was dissected out.  There was a good Doppler signal in the palmar arch with radial occlusion and a palpable pulse in the distal radial with the proximal occlusion.  The incision was extended to just below the antecubital fossa, and the radial artery was harvested using the harmonic scalpel.  Simultaneously, a median sternotomy was performed, and the left internal mammary artery was  harvested under direct vision.  2000 units of heparin  were administered during the vessel harvest.  The remainder of the full heparin  dose was given after closing the arm incision.  Both the radial and mammary had excellent flow when divided distally, although both were relatively small vessels given the patient's size.  The sternal retractor was placed and was gradually opened over time.  The pericardium was opened.  The remainder of the full heparin  was given, after confirming adequate anticoagulation with ACT measurement, the aorta was cannulated via concentric, 2-0 Ethibond pledgeted pursestring sutures.  A dual-stage venous cannula was placed via a pursestring suture in the radial appendage.  Cardiopulmonary bypass was initiated, and flows were maintained per protocol.  The coronary arteries were inspected, and anastomotic sites were chosen.  OM2 was a very small vessel not suitable for bypass grafting.  The best distal target in the circumflex distal to the tight stenosis was OM4, which was the posterior  descending equivalent.  It was a 1.5-mm vessel.  The LAD was intramyocardial and was not dissected out at this time.  A foam pad was placed in the  pericardium to insulate the heart.  A temperature probe was placed in the myocardial septum, and a cardioplegia cannula was placed in the ascending aorta.  The aorta was cross-clamped.  The left ventricle was emptied via the aortic root vent.  Cardiac arrest then was achieved with a combination of cold, antegrade blood cardioplegia and topical iced saline.  After achieving a complete diastolic arrest and septal cooling to 13 degrees Celsius with 1 liter of cardioplegia, the following distal anastomoses were performed.  First, the distal end of the left radial artery was beveled.  It was anastomosed end-to-side to OM4 with a running 8-0 Prolene suture.  Both vessels were 1.5 mm in diameter, so relatively small, but both were otherwise good quality.  At the completion of the anastomosis, the probe was passed easily proximally and distally.  Cardioplegia was administered down the graft, and there was good flow and good hemostasis.  The left internal mammary artery was brought through a window in the pericardium.  The distal end was beveled.  The LAD then was dissected out.  During the dissection, there was a small entry into the right ventricle.  The LAD was completely intramyocardial except very distally beyond the length of the mammary.  A 4-0 Prolene pledgeted suture was used to repair that small tear.  Once the vessel was identified, it was a 1.5-mm, good quality target.  The mammary was a good quality conduit.  The end-to-side anastomosis was performed with a running 8-0 Prolene suture.  At the completion of the mammary-LAD anastomosis, the bulldog clamp was removed.  Rapid septal rewarming was noted.  The bulldog clamp was replaced, and the mammary pedicle was tacked to the epicardial surface of the heart with 6-0 Prolene sutures.  Rewarming was initiated.  The proximal end of the radial graft was cut to length.  It was then anastomosed end-to-side to the ascending aorta to a 4.0-mm punch aortotomy with a  running 7-0 Prolene suture.  At the completion of that anastomosis, the patient was placed in Trendelenburg position.  The bulldog clamp was again removed from the left mammary artery.  Lidocaine  was administered.  The aortic root was de-aired, and the aortic cross-clamp was removed.  The total cross-clamp time was 53 minutes.  While rewarming was completed, all proximal and distal anastomoses were inspected for hemostasis.  Epicardial pacing wires were placed on the right ventricle and right atrium.  When the patient had rewarmed to a core temperature of 37 degrees Celsius, he was weaned from cardiopulmonary bypass on the first attempt.  Total bypass time was 96 minutes.  The initial cardiac index was greater than 2 liters per minute per meter squared, and the patient remained hemodynamically stable throughout the post-bypass period.  A test dose of protamine  was administered and was well tolerated.  The arterial and aortic cannulae were removed.  The remainder of the protamine  was administered without incident.  The chest was irrigated with warm saline.  Hemostasis was achieved.  Left pleural and mediastinal chest tubes were placed through separate subcostal incisions.  The pericardium was reapproximated over the ascending aorta and base of the heart with interrupted 3-0 silk sutures.  The sternum was closed with a combination of single and double heavy-gauge stainless steel wires.  There was a transient drop in cardiac index with  sternal closure, which improved with time and volume.  The pectoralis fascia and subcutaneous tissue and skin were closed in a standard fashion.  All sponge, needle, and instrument counts were correct at the end of the procedure.  The patient was taken from the operating room to the surgical intensive care unit, intubated and in good condition.   PUS D: 11/08/2023 5:35:19 pm T: 11/08/2023 8:04:00 pm  JOB: [id number]/ [id number]

## 2023-11-08 NOTE — Anesthesia Preprocedure Evaluation (Addendum)
 Anesthesia Evaluation  Patient identified by MRN, date of birth, ID band Patient awake    Reviewed: Allergy & Precautions, NPO status , Patient's Chart, lab work & pertinent test results  Airway Mallampati: II  TM Distance: >3 FB Neck ROM: Full    Dental no notable dental hx.    Pulmonary neg pulmonary ROS   Pulmonary exam normal        Cardiovascular + CAD and + Past MI  Normal cardiovascular exam     Neuro/Psych negative neurological ROS  negative psych ROS   GI/Hepatic negative GI ROS, Neg liver ROS,,,  Endo/Other  diabetes, Insulin  Dependent, Oral Hypoglycemic Agents  Patient on GLP-1 Agonist  Renal/GU negative Renal ROS     Musculoskeletal negative musculoskeletal ROS (+)    Abdominal   Peds  Hematology negative hematology ROS (+)   Anesthesia Other Findings CAD  Reproductive/Obstetrics                             Anesthesia Physical Anesthesia Plan  ASA: 4  Anesthesia Plan: General   Post-op Pain Management:    Induction: Intravenous  PONV Risk Score and Plan: 2 and Ondansetron , Dexamethasone, Midazolam  and Treatment may vary due to age or medical condition  Airway Management Planned: Oral ETT  Additional Equipment: Arterial line, CVP, PA Cath, TEE and Ultrasound Guidance Line Placement  Intra-op Plan:   Post-operative Plan: Post-operative intubation/ventilation  Informed Consent: I have reviewed the patients History and Physical, chart, labs and discussed the procedure including the risks, benefits and alternatives for the proposed anesthesia with the patient or authorized representative who has indicated his/her understanding and acceptance.     Dental advisory given  Plan Discussed with: CRNA  Anesthesia Plan Comments:        Anesthesia Quick Evaluation

## 2023-11-08 NOTE — Progress Notes (Signed)
 EVENING ROUNDS NOTE :     [address]       [place] [address]             [phone]                 * Day of Surgery * Procedure(s) (LRB): CORONARY ARTERY BYPASS GRAFTING X TWO USING LEFT INTERNAL MAMMARY ARTERY AND LEFT RADIAL ARTERY (N/A) ECHOCARDIOGRAM, TRANSESOPHAGEAL, INTRAOPERATIVE (N/A) SURGICAL PROCUREMENT, ARTERY, RADIAL (Left)   Total Length of Stay:  LOS: 4 days  Events:   Pressures better Low CT output    BP 107/66   Pulse 82   Temp (!) 96.4 F (35.8 C)   Resp 16   Ht 5' 10 (1.778 m)   Wt 74.5 kg   SpO2 100%   BMI 23.57 kg/m   PAP: (9-21)/(1-11) 17/10 CO:  [2.6 L/min-3 L/min] 2.8 L/min CI:  [1.4 L/min/m2-1.6 L/min/m2] 1.5 L/min/m2  Vent Mode: SIMV;PSV;PRVC FiO2 (%):  [50 %] 50 % Set Rate:  [16 bmp] 16 bmp Vt Set:  [580 mL] 580 mL PEEP:  [5 cmH20] 5 cmH20 Pressure Support:  [10 cmH20] 10 cmH20 Plateau Pressure:  [14 cmH20] 14 cmH20   sodium chloride  20 mL/hr at 11/08/23 1715   [START ON 11/09/2023] sodium chloride      albumin human 12.5 g (11/08/23 1817)    ceFAZolin  (ANCEF ) IV 2 g (11/08/23 1847)   dexmedetomidine  (PRECEDEX ) IV infusion 0.7 mcg/kg/hr (11/08/23 1715)   insulin  1.9 Units/hr (11/08/23 1715)   lactated ringers     lactated ringers 20 mL/hr at 11/08/23 1715   magnesium  sulfate 4 g (11/08/23 1739)   nitroGLYCERIN  10 mcg/min (11/08/23 1715)   phenylephrine  (NEO-SYNEPHRINE) Adult infusion 5 mcg/min (11/08/23 1715)   potassium chloride  10 mEq (11/08/23 1745)   [START ON 11/09/2023] vancomycin       I/O last 3 completed shifts: In: 798.6 [P.O.:240; I.V.:558.6] Out: -       Latest Ref Rng & Units 11/08/2023    5:30 PM 11/08/2023    4:31 PM 11/08/2023    4:28 PM  CBC  WBC 4.0 - 10.5 K/uL 5.4     Hemoglobin 13.0 - 17.0 g/dL 9.1  8.8  8.8   Hematocrit 39.0 - 52.0 % 27.5  26.0  26.0   Platelets 150 - 400 K/uL 100          Latest Ref Rng & Units 11/08/2023    4:31 PM 11/08/2023    4:28 PM 11/08/2023    3:44 PM   BMP  Glucose 70 - 99 mg/dL  868    BUN 6 - 20 mg/dL  12    Creatinine 9.38 - 1.24 mg/dL  9.39    Sodium 864 - 854 mmol/L 138  138  136   Potassium 3.5 - 5.1 mmol/L 4.2  4.2  5.0   Chloride 98 - 111 mmol/L  104      ABG    Component Value Date/Time   PHART 7.430 11/08/2023 1631   PCO2ART 32.5 11/08/2023 1631   PO2ART 498 (H) 11/08/2023 1631   HCO3 21.6 11/08/2023 1631   TCO2 23 11/08/2023 1631   ACIDBASEDEF 2.0 11/08/2023 1631   O2SAT 100 11/08/2023 1631       Marcus Rayas, MD 11/08/2023 6:57 PM

## 2023-11-08 NOTE — Anesthesia Procedure Notes (Signed)
 Arterial Line Insertion Start/End6/23/2025 10:34 AM Performed by: Lamar Lucie DASEN, CRNA  Patient location: Pre-op. Preanesthetic checklist: patient identified, IV checked, site marked, risks and benefits discussed, surgical consent, monitors and equipment checked, pre-op evaluation, timeout performed and anesthesia consent Lidocaine  1% used for infiltration Right, radial was placed Catheter size: 20 G Hand hygiene performed  and maximum sterile barriers used   Attempts: 2 Procedure performed without using ultrasound guided technique. Following insertion, dressing applied and Biopatch. Post procedure assessment: normal and unchanged

## 2023-11-08 NOTE — Anesthesia Procedure Notes (Addendum)
 Central Venous Catheter Insertion Performed by: Patrisha Bernardino SQUIBB, MD, anesthesiologist Start/End6/23/2025 10:30 AM, 11/08/2023 10:40 AM Patient location: Pre-op. Preanesthetic checklist: patient identified, IV checked, site marked, risks and benefits discussed, surgical consent, monitors and equipment checked, pre-op evaluation, timeout performed and anesthesia consent Position: Trendelenburg Lidocaine  1% used for infiltration and patient sedated Hand hygiene performed  and maximum sterile barriers used  Catheter size: 9 Fr Total catheter length 12. MAC introducer Procedure performed using ultrasound guided technique. Ultrasound Notes:anatomy identified, needle tip was noted to be adjacent to the nerve/plexus identified, no ultrasound evidence of intravascular and/or intraneural injection and image(s) printed for medical record Attempts: 1 Following insertion, line sutured and dressing applied. Post procedure assessment: blood return through all ports, free fluid flow and no air  Patient tolerated the procedure well with no immediate complications.

## 2023-11-08 NOTE — Brief Op Note (Incomplete)
 11/03/2023 - 11/08/2023  4:10 PM  PATIENT:  Marcus Hart  58 y.o. male  PRE-OPERATIVE DIAGNOSIS:  CORONARY ARTERY DISEASE  POST-OPERATIVE DIAGNOSIS:  CORONARY ARTERY DISEASE  PROCEDURE:  Procedure(s): CORONARY ARTERY BYPASS GRAFTING X TWO USING LEFT INTERNAL MAMMARY ARTERY AND LEFT RADIAL ARTERY (N/A) ECHOCARDIOGRAM, TRANSESOPHAGEAL, INTRAOPERATIVE (N/A) SURGICAL PROCUREMENT, ARTERY, RADIAL (Left) radial harvest time: Vein prep time:  SURGEON:  Surgeons and Role:    * Kerrin Elspeth BROCKS, MD - Primary  PHYSICIAN ASSISTANT: Lysle Yero PA-C  ASSISTANTS: KRISTINA PRICE RNFA   ANESTHESIA:   general  EBL:  {None/Minimal: 21241}   BLOOD ADMINISTERED:none  DRAINS: LEFT PLEURAL AND MEDIASTINAL CHEST TUBES   LOCAL MEDICATIONS USED:  NONE  SPECIMEN:  No Specimen  DISPOSITION OF SPECIMEN:  N/A  COUNTS:  YES  TOURNIQUET:  * No tourniquets in log *  DICTATION: .Other Dictation: Dictation Number PENDING  PLAN OF CARE: Admit to inpatient   PATIENT DISPOSITION: ICU, HEMODYNAMICALLY STABLE   Delay start of Pharmacological VTE agent (>24hrs) due to surgical blood loss or risk of bleeding: yes  COMPLICATIONS: NO KNOWN

## 2023-11-08 NOTE — Procedures (Signed)
 Extubation Procedure Note  Patient Details:   Name: Marcus Hart DOB: 12/20/1965 MRN: [id number]   Airway Documentation:    Vent end date: 11/08/23 Vent end time: 2246   Evaluation  O2 sats: stable throughout Complications: No apparent complications Patient did tolerate procedure well. Bilateral Breath Sounds: Clear, Diminished   Yes RT extubated patient per rapid wean protocol. Pt achieved -28 NIF and VC 1.1L. Cuff leak prior to extubation with no stridor post extubation. RN at bedside.   [name] 11/08/2023, 10:52 PM

## 2023-11-08 NOTE — Hospital Course (Addendum)
 History of Present Illness:     This is a 58 year old [place] male with a past medical history of hyperlipidemia and diabetes mellitus who presented to the [facility] on 11/04/2023 in the early am with complaints of intermittent chest pain, that occurred with both exertion and at rest. Typically, chest pain would resolve after a few minutes;however on 11/04/2023 the chest pain became more constant. The pain radiates to his neck and right arm and he feels as though he has chills. The pain has not stopped him from doing things. Per wife, he was mowing lawn and cutting trees one week ago. He denied shortness of breath, nausea, abdominal pain, or syncope. Chest x ray showed no active disease. EKG showed T wave inversions in leads II, III, and aVF and subtle ST depressions in the precordial leads. Initial Troponin I (high sensitivity) was 383 and max's at 412. He ruled in for a NSTEMI. He was started on Heparin  and given aspirin . He was transported to [facility] for further evaluation and treatment.  He had an echocardiogram done 11/04/2023 which showed LVEF 55-60%, LV has no regional wall abnormalities, and no significant valvular abnormalities. He then underwent a cardiac catheterization and was found to have an ostial LAD lesion with a 70% stenosis, proximal to mid LAD with aa 50% stenosis (IVUS determined lesion is calcified), mid to distal Circumflex with a 99% stenosis, and a RV branch with a 90% stenosis. Cardiothoracic consultation was requested for consideration of coronary artery bypass grafting surgery. At the time of my exam, patient denies chest pain.  Patient is married (wife at bedside), has 6 children, and works as a Museum/gallery exhibitions officer. He is very active.  Hospital Course: He was admitted to [facility] and cardiothoracic surgery was consulted for surgical treatment options. Dr. Kerrin reviewed the patient's diagnostic studies and determined he would benefit from surgical  intervention. He reviewed the patient's treatment options as well as the risks and benefits of surgical intervention. Mr. Selover was agreeable to proceed with surgery. He remained stable while inpatient at [facility] and was brought to the operating room on 11/08/23. He underwent CABG x 2 utilizing LIMA to LAD and radial artery to OM4, he tolerated the procedure well and was transferred to the SICU in stable condition. IV nitroglycerin  was transitioned to Norvasc  for radial artery conduit. His chest tubes and epicardial pacing wires were removed without complication. He developed nausea and was started on Reglan  and Zofran  PRN. CBGs were elevated, he was restarted on home metformin . He had a soft blood pressure on low dose Lopressor  and Norvasc . He was felt stable for transfer to the progressive unit 06/26. He continue to maintain sinus rhythm. EC ASA was decreased to 81 mg daily and he was started on Plavix  for NSTEMI. He was on 1 liter of oxygen via [place] and was weaned to room air. He has been tolerating a diet and had a bowel movement with laxative. Motor/sensory intact LUE and all wounds are clean, dry, healing without signs of infection. He began ambulating well on room air. He was felt stable for discharge home.

## 2023-11-09 ENCOUNTER — Encounter (HOSPITAL_COMMUNITY): Payer: Self-pay | Admitting: Thoracic Surgery (Cardiothoracic Vascular Surgery)

## 2023-11-09 ENCOUNTER — Inpatient Hospital Stay (HOSPITAL_COMMUNITY)

## 2023-11-09 LAB — BASIC METABOLIC PANEL WITH GFR
Anion gap: 6 (ref 5–15)
Anion gap: 7 (ref 5–15)
BUN: 12 mg/dL (ref 6–20)
BUN: 12 mg/dL (ref 6–20)
CO2: 20 mmol/L — ABNORMAL LOW (ref 22–32)
CO2: 21 mmol/L — ABNORMAL LOW (ref 22–32)
Calcium: 7.6 mg/dL — ABNORMAL LOW (ref 8.9–10.3)
Calcium: 8 mg/dL — ABNORMAL LOW (ref 8.9–10.3)
Chloride: 106 mmol/L (ref 98–111)
Chloride: 99 mmol/L (ref 98–111)
Creatinine, Ser: 0.79 mg/dL (ref 0.61–1.24)
Creatinine, Ser: 0.81 mg/dL (ref 0.61–1.24)
GFR, Estimated: >60 mL/min (ref >60)
GFR, Estimated: >60 mL/min (ref >60)
Glucose, Bld: 127 mg/dL — ABNORMAL HIGH (ref 70–99)
Glucose, Bld: 254 mg/dL — ABNORMAL HIGH (ref 70–99)
Potassium: 4.9 mmol/L (ref 3.5–5.1)
Potassium: 4.8 mmol/L (ref 3.5–5.1)
Sodium: 132 mmol/L — ABNORMAL LOW (ref 135–145)
Sodium: 127 mmol/L — ABNORMAL LOW (ref 135–145)

## 2023-11-09 LAB — CBC
HCT: 28.5 % — ABNORMAL LOW (ref 39.0–52.0)
HCT: 27.7 % — ABNORMAL LOW (ref 39.0–52.0)
Hemoglobin: 9.3 g/dL — ABNORMAL LOW (ref 13.0–17.0)
Hemoglobin: 8.9 g/dL — ABNORMAL LOW (ref 13.0–17.0)
MCH: 28.4 pg (ref 26.0–34.0)
MCH: 28.6 pg (ref 26.0–34.0)
MCHC: 32.6 g/dL (ref 30.0–36.0)
MCHC: 32.1 g/dL (ref 30.0–36.0)
MCV: 86.9 fL (ref 80.0–100.0)
MCV: 89.1 fL (ref 80.0–100.0)
Platelets: 159 10*3/uL (ref 150–400)
Platelets: 104 10*3/uL — ABNORMAL LOW (ref 150–400)
RBC: 3.28 MIL/uL — ABNORMAL LOW (ref 4.22–5.81)
RBC: 3.11 MIL/uL — ABNORMAL LOW (ref 4.22–5.81)
RDW: 13 % (ref 11.5–15.5)
RDW: 13 % (ref 11.5–15.5)
WBC: 6.2 10*3/uL (ref 4.0–10.5)
WBC: 5.1 10*3/uL (ref 4.0–10.5)
nRBC: 0 % (ref 0.0–0.2)
nRBC: 0 % (ref 0.0–0.2)

## 2023-11-09 LAB — POCT I-STAT 7, (LYTES, BLD GAS, ICA,H+H)
Acid-base deficit: 5 mmol/L — ABNORMAL HIGH (ref 0.0–2.0)
Bicarbonate: 21 mmol/L (ref 20.0–28.0)
Calcium, Ion: 1.13 mmol/L — ABNORMAL LOW (ref 1.15–1.40)
HCT: 26 % — ABNORMAL LOW (ref 39.0–52.0)
Hemoglobin: 8.8 g/dL — ABNORMAL LOW (ref 13.0–17.0)
O2 Saturation: 99 %
Patient temperature: 37.7
Potassium: 4.8 mmol/L (ref 3.5–5.1)
Sodium: 138 mmol/L (ref 135–145)
TCO2: 22 mmol/L (ref 22–32)
pCO2 arterial: 43.7 mmHg (ref 32–48)
pH, Arterial: 7.294 — ABNORMAL LOW (ref 7.35–7.45)
pO2, Arterial: 165 mmHg — ABNORMAL HIGH (ref 83–108)

## 2023-11-09 LAB — GLUCOSE, CAPILLARY
Glucose-Capillary: 125 mg/dL — ABNORMAL HIGH (ref 70–99)
Glucose-Capillary: 126 mg/dL — ABNORMAL HIGH (ref 70–99)
Glucose-Capillary: 135 mg/dL — ABNORMAL HIGH (ref 70–99)
Glucose-Capillary: 136 mg/dL — ABNORMAL HIGH (ref 70–99)
Glucose-Capillary: 140 mg/dL — ABNORMAL HIGH (ref 70–99)
Glucose-Capillary: 156 mg/dL — ABNORMAL HIGH (ref 70–99)
Glucose-Capillary: 160 mg/dL — ABNORMAL HIGH (ref 70–99)
Glucose-Capillary: 178 mg/dL — ABNORMAL HIGH (ref 70–99)
Glucose-Capillary: 215 mg/dL — ABNORMAL HIGH (ref 70–99)
Glucose-Capillary: 227 mg/dL — ABNORMAL HIGH (ref 70–99)
Glucose-Capillary: 259 mg/dL — ABNORMAL HIGH (ref 70–99)

## 2023-11-09 LAB — MAGNESIUM
Magnesium: 2.6 mg/dL — ABNORMAL HIGH (ref 1.7–2.4)
Magnesium: 2.3 mg/dL (ref 1.7–2.4)

## 2023-11-09 MED ORDER — ALBUMIN HUMAN 5 % IV SOLN
12.5000 g | Freq: Once | INTRAVENOUS | Status: AC
  Administered 2023-11-09: 12.5 g via INTRAVENOUS
  Filled 2023-11-09: qty 250

## 2023-11-09 MED ORDER — CHLORHEXIDINE GLUCONATE CLOTH 2 % EX PADS
6.0000 | MEDICATED_PAD | Freq: Every day | CUTANEOUS | Status: DC
  Administered 2023-11-09 – 2023-11-12 (×3): 6 via TOPICAL

## 2023-11-09 MED ORDER — INSULIN ASPART 100 UNIT/ML IJ SOLN
0.0000 [IU] | INTRAMUSCULAR | Status: DC
  Administered 2023-11-09: 8 [IU] via SUBCUTANEOUS
  Administered 2023-11-09: 12 [IU] via SUBCUTANEOUS
  Administered 2023-11-09 – 2023-11-10 (×2): 2 [IU] via SUBCUTANEOUS
  Administered 2023-11-10: 8 [IU] via SUBCUTANEOUS

## 2023-11-09 MED ORDER — KETOROLAC TROMETHAMINE 15 MG/ML IJ SOLN
15.0000 mg | Freq: Four times a day (QID) | INTRAMUSCULAR | Status: AC
  Administered 2023-11-09 – 2023-11-10 (×4): 15 mg via INTRAVENOUS
  Filled 2023-11-09 (×4): qty 1

## 2023-11-09 MED ORDER — INSULIN GLARGINE-YFGN 100 UNIT/ML ~~LOC~~ SOLN
12.0000 [IU] | Freq: Two times a day (BID) | SUBCUTANEOUS | Status: DC
  Administered 2023-11-09 – 2023-11-11 (×6): 12 [IU] via SUBCUTANEOUS
  Filled 2023-11-09 (×9): qty 0.12

## 2023-11-09 NOTE — Progress Notes (Signed)
 Patient ID: Marcus Hart, male   DOB: 07-15-1965, 59 y.o.   MRN: [id number]  TCTS Evening Rounds:  Hemodynamically stable.  CT's left in today since they put out 180 cc when he stood up.  UO good.  CBC    Component Value Date/Time   WBC 5.1 11/09/2023 1653   RBC 3.11 (L) 11/09/2023 1653   HGB 8.9 (L) 11/09/2023 1653   HCT 27.7 (L) 11/09/2023 1653   PLT 104 (L) 11/09/2023 1653   MCV 89.1 11/09/2023 1653   MCV 80.4 11/22/2015 0934   MCH 28.6 11/09/2023 1653   MCHC 32.1 11/09/2023 1653   RDW 13.0 11/09/2023 1653   LYMPHSABS 1.0 11/03/2023 2240   MONOABS 0.3 11/03/2023 2240   EOSABS 0.1 11/03/2023 2240   BASOSABS 0.0 11/03/2023 2240   BMET    Component Value Date/Time   NA 127 (L) 11/09/2023 1653   NA 138 03/15/2020 1635   K 4.8 11/09/2023 1653   CL 99 11/09/2023 1653   CO2 21 (L) 11/09/2023 1653   GLUCOSE 254 (H) 11/09/2023 1653   BUN 12 11/09/2023 1653   BUN 14 03/15/2020 1635   CREATININE 0.81 11/09/2023 1653   CREATININE 0.78 10/31/2015 0918   CALCIUM  8.0 (L) 11/09/2023 1653   GFRNONAA >60 11/09/2023 1653   GFRNONAA >89 10/31/2015 9081

## 2023-11-09 NOTE — Progress Notes (Signed)
 1 Day Post-Op Procedure(s) (LRB): CORONARY ARTERY BYPASS GRAFTING X TWO USING LEFT INTERNAL MAMMARY ARTERY AND LEFT RADIAL ARTERY (N/A) ECHOCARDIOGRAM, TRANSESOPHAGEAL, INTRAOPERATIVE (N/A) SURGICAL PROCUREMENT, ARTERY, RADIAL (Left) Subjective: C/o incisional pain  Objective: Vital signs in last 24 hours: Temp:  [95.9 F (35.5 C)-100.6 F (38.1 C)] 99.7 F (37.6 C) (06/24 0700) Pulse Rate:  [67-89] 80 (06/24 0700) Cardiac Rhythm: Normal sinus rhythm (06/23 2100) Resp:  [13-39] 26 (06/24 0700) BP: (87-131)/(66-76) 87/67 (06/24 0400) SpO2:  [94 %-100 %] 98 % (06/24 0700) Arterial Line BP: (60-129)/(35-74) 108/56 (06/24 0700) FiO2 (%):  [40 %-50 %] 40 % (06/23 2200) Weight:  [74.5 kg-80.3 kg] 80.3 kg (06/24 0500)  Hemodynamic parameters for last 24 hours: PAP: (9-30)/(1-17) 22/10 CO:  [2.6 L/min-4.4 L/min] 3.8 L/min CI:  [1.4 L/min/m2-2.3 L/min/m2] 2 L/min/m2  Intake/Output from previous day: 06/23 0701 - 06/24 0700 In: 5422.1 [P.O.:500; I.V.:2402.1; IV Piggyback:2520] Out: 3440 [Urine:2700; Emesis/NG output:50; Chest Tube:690] Intake/Output this shift: No intake/output data recorded.  General appearance: alert, cooperative, and no distress Neurologic: intact Heart: regular rate and rhythm Lungs: diminished breath sounds bibasilar Abdomen: normal findings: soft, non-tender + pericardial friction rub Lab Results: Recent Labs    11/08/23 1730 11/08/23 1736 11/09/23 0012 11/09/23 0515  WBC 5.4  --   --  6.2  HGB 9.1*   < > 8.8* 9.3*  HCT 27.5*   < > 26.0* 28.5*  PLT 100*  --   --  159   < > = values in this interval not displayed.   BMET:  Recent Labs    11/08/23 0319 11/08/23 1234 11/08/23 1628 11/08/23 1631 11/09/23 0012 11/09/23 0515  NA 132*   < > 138   < > 138 132*  K 4.9   < > 4.2   < > 4.8 4.9  CL 100   < > 104  --   --  106  CO2 26  --   --   --   --  20*  GLUCOSE 177*   < > 131*  --   --  127*  BUN 15   < > 12  --   --  12  CREATININE 0.90   < >  0.60*  --   --  0.79  CALCIUM  9.1  --   --   --   --  7.6*   < > = values in this interval not displayed.    PT/INR:  Recent Labs    11/08/23 1730  LABPROT 18.2*  INR 1.5*   ABG    Component Value Date/Time   PHART 7.294 (L) 11/09/2023 0012   HCO3 21.0 11/09/2023 0012   TCO2 22 11/09/2023 0012   ACIDBASEDEF 5.0 (H) 11/09/2023 0012   O2SAT 99 11/09/2023 0012   CBG (last 3)  Recent Labs    11/09/23 0318 11/09/23 0513 11/09/23 0607  GLUCAP 140* 125* 126*    Assessment/Plan: S/P Procedure(s) (LRB): CORONARY ARTERY BYPASS GRAFTING X TWO USING LEFT INTERNAL MAMMARY ARTERY AND LEFT RADIAL ARTERY (N/A) ECHOCARDIOGRAM, TRANSESOPHAGEAL, INTRAOPERATIVE (N/A) SURGICAL PROCUREMENT, ARTERY, RADIAL (Left) POD # 1 NEURO- intact CV- in Sr.  CI 1.8- low filling pressures  Albumin to increase preload  ASA, statin, beta blocker  Norvasc for radial graft- dc IV NTG RESP- IS RENAL- creatinine and lytes OK  Weight above preop but intravascular dry ENDO- CBG mildly elevated  Transition to [place] + SSI GI advance diet as tolerated Anemia secondary to ABL- monitor Dc chest tubes Cardiac  rehab     LOS: 5 days    Marcus Hart 11/09/2023

## 2023-11-09 NOTE — Plan of Care (Signed)
  Problem: Education: Goal: Understanding of cardiac disease, CV risk reduction, and recovery process will improve Outcome: Progressing Goal: Individualized Educational Video(s) Outcome: Progressing   Problem: Activity: Goal: Ability to tolerate increased activity will improve Outcome: Progressing   Problem: Cardiac: Goal: Ability to achieve and maintain adequate cardiovascular perfusion will improve Outcome: Progressing   Problem: Health Behavior/Discharge Planning: Goal: Ability to safely manage health-related needs after discharge will improve Outcome: Progressing   Problem: Education: Goal: Individualized Educational Video(s) Outcome: Progressing   Problem: Coping: Goal: Ability to adjust to condition or change in health will improve Outcome: Progressing   Problem: Fluid Volume: Goal: Ability to maintain a balanced intake and output will improve Outcome: Progressing   Problem: Health Behavior/Discharge Planning: Goal: Ability to identify and utilize available resources and services will improve Outcome: Progressing Goal: Ability to manage health-related needs will improve Outcome: Progressing   Problem: Metabolic: Goal: Ability to maintain appropriate glucose levels will improve Outcome: Progressing   Problem: Nutritional: Goal: Maintenance of adequate nutrition will improve Outcome: Progressing Goal: Progress toward achieving an optimal weight will improve Outcome: Progressing   Problem: Skin Integrity: Goal: Risk for impaired skin integrity will decrease Outcome: Progressing   Problem: Tissue Perfusion: Goal: Adequacy of tissue perfusion will improve Outcome: Progressing   Problem: Education: Goal: Knowledge of General Education information will improve Description: Including pain rating scale, medication(s)/side effects and non-pharmacologic comfort measures Outcome: Progressing   Problem: Health Behavior/Discharge Planning: Goal: Ability to manage  health-related needs will improve Outcome: Progressing   Problem: Clinical Measurements: Goal: Ability to maintain clinical measurements within normal limits will improve Outcome: Progressing Goal: Will remain free from infection Outcome: Progressing Goal: Diagnostic test results will improve Outcome: Progressing Goal: Respiratory complications will improve Outcome: Progressing Goal: Cardiovascular complication will be avoided Outcome: Progressing   Problem: Activity: Goal: Risk for activity intolerance will decrease Outcome: Progressing   Problem: Nutrition: Goal: Adequate nutrition will be maintained Outcome: Progressing   Problem: Coping: Goal: Level of anxiety will decrease Outcome: Progressing   Problem: Elimination: Goal: Will not experience complications related to bowel motility Outcome: Progressing Goal: Will not experience complications related to urinary retention Outcome: Progressing   Problem: Pain Managment: Goal: General experience of comfort will improve and/or be controlled Outcome: Progressing   Problem: Safety: Goal: Ability to remain free from injury will improve Outcome: Progressing   Problem: Skin Integrity: Goal: Risk for impaired skin integrity will decrease Outcome: Progressing   Problem: Education: Goal: Will demonstrate proper wound care and an understanding of methods to prevent future damage Outcome: Progressing Goal: Knowledge of disease or condition will improve Outcome: Progressing Goal: Knowledge of the prescribed therapeutic regimen will improve Outcome: Progressing Goal: Individualized Educational Video(s) Outcome: Progressing   Problem: Activity: Goal: Risk for activity intolerance will decrease Outcome: Progressing   Problem: Cardiac: Goal: Will achieve and/or maintain hemodynamic stability Outcome: Progressing   Problem: Clinical Measurements: Goal: Postoperative complications will be avoided or minimized Outcome:  Progressing   Problem: Respiratory: Goal: Respiratory status will improve Outcome: Progressing   Problem: Skin Integrity: Goal: Wound healing without signs and symptoms of infection Outcome: Progressing Goal: Risk for impaired skin integrity will decrease Outcome: Progressing   Problem: Urinary Elimination: Goal: Ability to achieve and maintain adequate renal perfusion and functioning will improve Outcome: Progressing

## 2023-11-10 ENCOUNTER — Inpatient Hospital Stay (HOSPITAL_COMMUNITY)

## 2023-11-10 LAB — CBC
HCT: 27.1 % — ABNORMAL LOW (ref 39.0–52.0)
Hemoglobin: 9 g/dL — ABNORMAL LOW (ref 13.0–17.0)
MCH: 29.5 pg (ref 26.0–34.0)
MCHC: 33.2 g/dL (ref 30.0–36.0)
MCV: 88.9 fL (ref 80.0–100.0)
Platelets: 115 10*3/uL — ABNORMAL LOW (ref 150–400)
RBC: 3.05 MIL/uL — ABNORMAL LOW (ref 4.22–5.81)
RDW: 13.2 % (ref 11.5–15.5)
WBC: 4.7 10*3/uL (ref 4.0–10.5)
nRBC: 0 % (ref 0.0–0.2)

## 2023-11-10 LAB — BASIC METABOLIC PANEL WITH GFR
Anion gap: 6 (ref 5–15)
BUN: 12 mg/dL (ref 6–20)
CO2: 22 mmol/L (ref 22–32)
Calcium: 8 mg/dL — ABNORMAL LOW (ref 8.9–10.3)
Chloride: 100 mmol/L (ref 98–111)
Creatinine, Ser: 0.84 mg/dL (ref 0.61–1.24)
GFR, Estimated: >60 mL/min (ref >60)
Glucose, Bld: 134 mg/dL — ABNORMAL HIGH (ref 70–99)
Potassium: 4.7 mmol/L (ref 3.5–5.1)
Sodium: 128 mmol/L — ABNORMAL LOW (ref 135–145)

## 2023-11-10 LAB — GLUCOSE, CAPILLARY
Glucose-Capillary: 175 mg/dL — ABNORMAL HIGH (ref 70–99)
Glucose-Capillary: 201 mg/dL — ABNORMAL HIGH (ref 70–99)
Glucose-Capillary: 203 mg/dL — ABNORMAL HIGH (ref 70–99)
Glucose-Capillary: 190 mg/dL — ABNORMAL HIGH (ref 70–99)
Glucose-Capillary: 217 mg/dL — ABNORMAL HIGH (ref 70–99)

## 2023-11-10 MED ORDER — MAGNESIUM HYDROXIDE 400 MG/5ML PO SUSP: 30.0000 mL | Freq: Every day | ORAL | Status: DC | PRN

## 2023-11-10 MED ORDER — KETOROLAC TROMETHAMINE 15 MG/ML IJ SOLN
15.0000 mg | Freq: Four times a day (QID) | INTRAMUSCULAR | Status: AC
  Administered 2023-11-10 – 2023-11-11 (×4): 15 mg via INTRAVENOUS
  Filled 2023-11-10 (×4): qty 1

## 2023-11-10 MED ORDER — INSULIN ASPART 100 UNIT/ML IJ SOLN
0.0000 [IU] | Freq: Every day | INTRAMUSCULAR | Status: DC
  Administered 2023-11-10 – 2023-11-11 (×2): 2 [IU] via SUBCUTANEOUS

## 2023-11-10 MED ORDER — METFORMIN HCL 500 MG PO TABS
1000.0000 mg | ORAL_TABLET | Freq: Two times a day (BID) | ORAL | Status: DC
  Administered 2023-11-10 – 2023-11-13 (×7): 1000 mg via ORAL
  Filled 2023-11-10 (×7): qty 2

## 2023-11-10 MED ORDER — SODIUM CHLORIDE 0.9% FLUSH
3.0000 mL | Freq: Two times a day (BID) | INTRAVENOUS | Status: DC
  Administered 2023-11-10 – 2023-11-12 (×5): 3 mL via INTRAVENOUS

## 2023-11-10 MED ORDER — SODIUM CHLORIDE 0.9 % IV SOLN: 250.0000 mL | INTRAVENOUS | Status: AC | PRN

## 2023-11-10 MED ORDER — ALUM & MAG HYDROXIDE-SIMETH 200-200-20 MG/5ML PO SUSP: 15.0000 mL | Freq: Four times a day (QID) | ORAL | Status: DC | PRN

## 2023-11-10 MED ORDER — SODIUM CHLORIDE 0.9% FLUSH: 3.0000 mL | INTRAVENOUS | Status: DC | PRN

## 2023-11-10 MED ORDER — METOCLOPRAMIDE HCL 5 MG/ML IJ SOLN
10.0000 mg | Freq: Four times a day (QID) | INTRAMUSCULAR | Status: AC
  Administered 2023-11-10 – 2023-11-11 (×4): 10 mg via INTRAVENOUS
  Filled 2023-11-10 (×4): qty 2

## 2023-11-10 MED ORDER — INSULIN ASPART 100 UNIT/ML IJ SOLN
0.0000 [IU] | Freq: Three times a day (TID) | INTRAMUSCULAR | Status: DC
  Administered 2023-11-10: 3 [IU] via SUBCUTANEOUS
  Administered 2023-11-10: 5 [IU] via SUBCUTANEOUS
  Administered 2023-11-11: 8 [IU] via SUBCUTANEOUS
  Administered 2023-11-11: 3 [IU] via SUBCUTANEOUS
  Administered 2023-11-11 – 2023-11-12 (×2): 5 [IU] via SUBCUTANEOUS
  Administered 2023-11-12: 3 [IU] via SUBCUTANEOUS
  Administered 2023-11-12: 5 [IU] via SUBCUTANEOUS

## 2023-11-10 MED ORDER — ORAL CARE MOUTH RINSE: 15.0000 mL | OROMUCOSAL | Status: DC | PRN

## 2023-11-10 MED ORDER — ~~LOC~~ CARDIAC SURGERY, PATIENT & FAMILY EDUCATION: Freq: Once | Status: AC

## 2023-11-10 MED ORDER — ALPRAZOLAM 0.25 MG PO TABS: 0.2500 mg | ORAL_TABLET | Freq: Four times a day (QID) | ORAL | Status: DC | PRN

## 2023-11-10 MED ORDER — INSULIN ASPART 100 UNIT/ML IJ SOLN
0.0000 [IU] | Freq: Three times a day (TID) | INTRAMUSCULAR | Status: DC
  Administered 2023-11-10: 4 [IU] via SUBCUTANEOUS

## 2023-11-10 NOTE — Discharge Instructions (Signed)

## 2023-11-10 NOTE — Anesthesia Postprocedure Evaluation (Signed)
 Anesthesia Post Note  Patient: Marcus Hart  Procedure(s) Performed: CORONARY ARTERY BYPASS GRAFTING X TWO USING LEFT INTERNAL MAMMARY ARTERY AND LEFT RADIAL ARTERY (Chest) ECHOCARDIOGRAM, TRANSESOPHAGEAL, INTRAOPERATIVE SURGICAL PROCUREMENT, ARTERY, RADIAL (Left)     Patient location during evaluation: ICU Anesthesia Type: General Level of consciousness: awake Pain management: pain level controlled Vital Signs Assessment: post-procedure vital signs reviewed and stable Respiratory status: spontaneous breathing, nonlabored ventilation and respiratory function stable Cardiovascular status: blood pressure returned to baseline and stable Postop Assessment: no apparent nausea or vomiting Anesthetic complications: no   No notable events documented.  Last Vitals:  Vitals:   11/10/23 0500 11/10/23 0600  BP: 108/71 (!) 85/53  Pulse:  81  Resp: (!) 26 (!) 23  Temp: 36.9 C 37.3 C  SpO2:  96%    Last Pain:  Vitals:   11/10/23 0600  TempSrc: Bladder  PainSc: 4                  Francis Yardley P Nzinga Ferran

## 2023-11-10 NOTE — Discharge Summary (Signed)
 [address] [place] [address]             [phone]        Physician Discharge Summary  Patient ID: Marcus Hart MRN: [id number] DOB/AGE: 57/22/1967 57 y.o.  Admit date: 11/03/2023 Discharge date: 11/13/2023  Admission Diagnoses:  Patient Active Problem List   Diagnosis Date Noted   NSTEMI (non-ST elevated myocardial infarction) (HCC) 11/03/2023   Type 2 diabetes mellitus with hyperglycemia, without long-term current use of insulin  (HCC) 02/13/2013   Hyperlipidemia 02/13/2013   Discharge Diagnoses:  Patient Active Problem List   Diagnosis Date Noted   S/P CABG x 2 11/08/2023   NSTEMI (non-ST elevated myocardial infarction) (HCC) 11/03/2023   Type 2 diabetes mellitus with hyperglycemia, without long-term current use of insulin  (HCC) 02/13/2013   Hyperlipidemia 02/13/2013     Discharged Condition: stable  History of Present Illness:     This is a 58 year old [place] male with a past medical history of hyperlipidemia and diabetes mellitus who presented to the [facility] at [facility] on 11/04/2023 in the early am with complaints of intermittent chest pain, that occurred with both exertion and at rest. Typically, chest pain would resolve after a few minutes;however on 11/04/2023 the chest pain became more constant. The pain radiates to his neck and right arm and he feels as though he has chills. The pain has not stopped him from doing things. Per wife, he was mowing lawn and cutting trees one week ago. He denied shortness of breath, nausea, abdominal pain, or syncope. Chest x ray showed no active disease. EKG showed T wave inversions in leads II, III, and aVF and subtle ST depressions in the precordial leads. Initial Troponin I (high sensitivity) was 383 and max's at 412. He ruled in for a NSTEMI. He was started on Heparin  and given aspirin . He was transported to [facility] for further evaluation and treatment.  He had an echocardiogram done  11/04/2023 which showed LVEF 55-60%, LV has no regional wall abnormalities, and no significant valvular abnormalities. He then underwent a cardiac catheterization and was found to have an ostial LAD lesion with a 70% stenosis, proximal to mid LAD with aa 50% stenosis (IVUS determined lesion is calcified), mid to distal Circumflex with a 99% stenosis, and a RV branch with a 90% stenosis. Cardiothoracic consultation was requested for consideration of coronary artery bypass grafting surgery. At the time of my exam, patient denies chest pain.  Patient is married (wife at bedside), has 6 children, and works as a Museum/gallery exhibitions officer. He is very active.  Hospital Course: He was admitted to [facility] and cardiothoracic surgery was consulted for surgical treatment options. Dr. Kerrin reviewed the patient's diagnostic studies and determined he would benefit from surgical intervention. He reviewed the patient's treatment options as well as the risks and benefits of surgical intervention. Mr. Tuite was agreeable to proceed with surgery. He remained stable while inpatient at [facility] and was brought to the operating room on 11/08/23. He underwent CABG x 2 utilizing LIMA to LAD and radial artery to OM4, he tolerated the procedure well and was transferred to the SICU in stable condition. IV nitroglycerin  was transitioned to Norvasc  for radial artery conduit. His chest tubes and epicardial pacing wires were removed without complication. He developed nausea and was started on Reglan  and Zofran  PRN. CBGs were elevated, he was restarted on home metformin . He had  a soft blood pressure on low dose Lopressor  and Norvasc . He was felt stable for transfer to the progressive unit 06/26. He continue to maintain sinus rhythm. EC ASA was decreased to 81 mg daily and he was started on Plavix  for NSTEMI. He was on 1 liter of oxygen via [place] and was weaned to room air. He has been tolerating a diet and had a bowel  movement with laxative. Motor/sensory intact LUE and all wounds are clean, dry, healing without signs of infection. He began ambulating well on room air. He was felt stable for discharge home.    Consults: None  Significant Diagnostic Studies:  LEFT HEART CATH AND CORONARY ANGIOGRAPHY  CORONARY PRESSURE/FFR STUDY  Coronary Ultrasound/IVUS   Coronary angiography 11/04/2023: LM: Essentially non existent with separate dual ostia to LAD and LCx LAD: Medium caliber vessel          Severe dampening with 6 [place] EBU 3.0 guide engagement          Ostial 70% stenosis, CSA 5.1 mm2          Another lesion under appreciated angiographically, but noted on IVUS about 26 mm from the ostium with severe 180 deg calcification and CSA 3.2 mm2          RFR in mid LAD 0.83. Significant gradient was in prox-mid LAD, not in ostial LAD LCx: Large dominant vessel         Mid to distal 99% stenosis (likely culprit) with TIMI I flow distally RCA: Nondominant           RV marginal ostial 90% stenosis   LVEDP 14 mmHg     ECHOCARDIOGRAM REPORT       Patient Name:   Marcus Hart Date of Exam: 11/04/2023  Medical Rec #:  [id number]      Height:       70.7 in  Accession #:    [id number]     Weight:       171.3 lb  Date of Birth:  July 20, 1965       BSA:          1.969 m  Patient Age:    58 years       BP:           96/63 mmHg  Patient Gender: M              HR:           63 bpm.  Exam Location:  Inpatient   Procedure: 2D Echo, Cardiac Doppler, Color Doppler and [place] Contrast  Bubble            Study (Both Spectral and Color Flow Doppler were utilized  during            procedure).   Indications:    NSTEMI    History:        Patient has prior history of Echocardiogram examinations  and                 Patient has no prior history of Echocardiogram  examinations.    Sonographer:   Therisa Crouch  Referring Phys: [id number] GEORGANNA ARCHER   IMPRESSIONS     1. Left ventricular ejection fraction, by  estimation, is 55 to 60%. The  left ventricle has normal function. The left ventricle has no regional  wall motion abnormalities. Left ventricular diastolic parameters were  normal.   2. Right ventricular systolic function is normal. The right ventricular  size is  normal.   3. The mitral valve is normal in structure. No evidence of mitral valve  regurgitation. No evidence of mitral stenosis.   4. The aortic valve is normal in structure. There is mild calcification  of the aortic valve. Aortic valve regurgitation is not visualized. No  aortic stenosis is present.   5. The inferior vena cava is normal in size with greater than 50%  respiratory variability, suggesting right atrial pressure of 3 mmHg.   FINDINGS   Left Ventricle: Left ventricular ejection fraction, by estimation, is 55  to 60%. The left ventricle has normal function. The left ventricle has no  regional wall motion abnormalities. The left ventricular internal cavity  size was normal in size. There is   no left ventricular hypertrophy. Left ventricular diastolic parameters  were normal.   Right Ventricle: The right ventricular size is normal. No increase in  right ventricular wall thickness. Right ventricular systolic function is  normal.   Left Atrium: Left atrial size was normal in size.   Right Atrium: Right atrial size was normal in size.   Pericardium: There is no evidence of pericardial effusion.   Mitral Valve: The mitral valve is normal in structure. No evidence of  mitral valve regurgitation. No evidence of mitral valve stenosis.   Tricuspid Valve: The tricuspid valve is normal in structure. Tricuspid  valve regurgitation is not demonstrated. No evidence of tricuspid  stenosis.   Aortic Valve: The aortic valve is normal in structure. There is mild  calcification of the aortic valve. Aortic valve regurgitation is not  visualized. No aortic stenosis is present.   Pulmonic Valve: The pulmonic valve was  normal in structure. Pulmonic valve  regurgitation is not visualized. No evidence of pulmonic stenosis.   Aorta: The aortic root is normal in size and structure.   Venous: The inferior vena cava is normal in size with greater than 50%  respiratory variability, suggesting right atrial pressure of 3 mmHg.   IAS/Shunts: No atrial level shunt detected by color flow Doppler. Agitated  saline contrast was given intravenously to evaluate for intracardiac  shunting.     LEFT VENTRICLE  PLAX 2D  LVIDd:         4.50 cm      Diastology  LVIDs:         2.80 cm      LV e' medial:    6.20 cm/s  LV PW:         0.80 cm      LV E/e' medial:  9.2  LV IVS:        0.70 cm      LV e' lateral:   12.30 cm/s  LVOT diam:     2.20 cm      LV E/e' lateral: 4.6  LVOT Area:     3.80 cm    LV Volumes (MOD)  LV vol d, MOD A2C: 105.0 ml  LV vol d, MOD A4C: 77.4 ml  LV vol s, MOD A2C: 44.2 ml  LV vol s, MOD A4C: 40.4 ml  LV SV MOD A2C:     60.8 ml  LV SV MOD A4C:     77.4 ml  LV SV MOD BP:      55.0 ml   RIGHT VENTRICLE             IVC  RV Basal diam:  3.10 cm     IVC diam: 1.00 cm  RV S prime:  10.60 cm/s  TAPSE (M-mode): 2.3 cm   LEFT ATRIUM             Index  LA diam:        2.70 cm 1.37 cm/m  LA Vol (A2C):   36.3 ml 18.43 ml/m  LA Vol (A4C):   30.5 ml 15.49 ml/m  LA Biplane Vol: 35.2 ml 17.87 ml/m     AORTA  Ao Root diam: 3.60 cm  Ao Asc diam:  3.10 cm   MITRAL VALVE  MV Area (PHT): 3.95 cm    SHUNTS  MV Decel Time: 192 msec    Systemic Diam: 2.20 cm  MV E velocity: 57.10 cm/s  MV A velocity: 64.50 cm/s  MV E/A ratio:  0.89   Aditya Sabharwal  Electronically signed by Ria Commander  Signature Date/Time: 11/04/2023/1:12:10 PM        Final     Treatments: surgery:  Operative Report    DATE OF PROCEDURE: 11/08/2023   PREOPERATIVE DIAGNOSIS:  Two-vessel coronary artery disease status post non-ST elevation MI.   POSTOPERATIVE DIAGNOSIS: Two-vessel coronary artery disease  status post non-ST elevation MI.   PROCEDURE:  Median sternotomy, extracorporeal circulation, coronary artery bypass grafting x2 (left internal mammary artery to LAD, left radial artery to OM4).   SURGEON:  Elspeth BROCKS. Kerrin, MD.   Discharge Exam: Blood pressure 101/60, pulse 96, temperature 98.2 F (36.8 C), temperature source Oral, resp. rate 20, height 5' 10 (1.778 m), weight 78.2 kg, SpO2 98%. General appearance: alert, cooperative, and no distress Neurologic: intact Heart: regular rate and rhythm, S1, S2 normal, no murmur, click, rub or gallop Lungs: clear to auscultation bilaterally Abdomen: soft, non-tender; bowel sounds normal; no masses,  no organomegaly Extremities: extremities normal, atraumatic, no cyanosis or edema Wound: Clean and dry without sign of infection. Left arm with some numbness of the radial aspect of the forearm but hand is neurovascularly intact  Discharge Medications:  The patient has been discharged on:   1.Beta Blocker:  Yes [  X ]                              No   [   ]                              If No, reason:  2.Ace Inhibitor/ARB: Yes [   ]                                     No  [   X ]                                     If No, reason: Hypotension  3.Statin:   Yes [ X  ]                  No  [   ]                  If No, reason:  4.Ecasa:  Yes  [  X ]                  No   [   ]  If No, reason:  Patient had ACS upon admission: Yes  Plavix /P2Y12 inhibitor: Yes [  X ]                                      No  [   ]     Discharge Instructions     Amb Referral to Cardiac Rehabilitation   Complete by: As directed    Diagnosis: CABG   CABG X ___: 2   After initial evaluation and assessments completed: Virtual Based Care may be provided alone or in conjunction with Phase 2 Cardiac Rehab based on patient barriers.: Yes   Intensive Cardiac Rehabilitation (ICR) [facility] location only OR Traditional Cardiac Rehabilitation  (TCR) *If criteria for ICR are not met will enroll in TCR ([facility] only): Yes      Allergies as of 11/13/2023       Reactions   Pork-derived Products Other (See Comments)        Medication List     TAKE these medications    acetaminophen  325 MG tablet Commonly known as: Tylenol  Take 2 tablets (650 mg total) by mouth every 6 (six) hours as needed for mild pain (pain score 1-3).   amLODipine  5 MG tablet Commonly known as: NORVASC  Take 1 tablet (5 mg total) by mouth daily.   aspirin  EC 81 MG tablet Take 81 mg by mouth daily. Swallow whole.   clopidogrel  75 MG tablet Commonly known as: PLAVIX  Take 1 tablet (75 mg total) by mouth daily.   metFORMIN  1000 MG tablet Commonly known as: GLUCOPHAGE  Take 1 tablet (1,000 mg total) by mouth 2 (two) times daily with a meal.   metoprolol  tartrate 25 MG tablet Commonly known as: LOPRESSOR  Take 0.5 tablets (12.5 mg total) by mouth 2 (two) times daily.   OneTouch Delica Plus Lancet33G Misc USE AS DIRECTED TO TEST BLOOD GLUCOSE   OneTouch Verio test strip Generic drug: glucose blood USE AS DIRECTED   oxyCODONE  5 MG immediate release tablet Commonly known as: Oxy IR/ROXICODONE  Take 1 tablet (5 mg total) by mouth every 6 (six) hours as needed for severe pain (pain score 7-10).   Pen Needles 32G X 4 MM Misc Use with Victoza  to inject once daily   rosuvastatin  40 MG tablet Commonly known as: CRESTOR  Take 1 tablet (40 mg total) by mouth daily. What changed:  medication strength how much to take   Trulicity  3 MG/0.5ML Soaj Generic drug: Dulaglutide  Inject 3 mg into the skin once a week.        Follow-up Information     Levora Reyes SAUNDERS, MD. Call.   Specialties: Family Medicine, Sports Medicine Why: for follow up regarding further diabetes mangament and surveillance of HGA1C 7.6 Contact information: [address] [place] [address] [phone]         Kerrin Elspeth BROCKS, MD. Go on 12/21/2023.   Specialty:  Cardiothoracic Surgery Why: Appointment time is at 9:45 am Contact information: [address] [place] [address] [phone]         Diag Imaging at [facility] [place]. [facility] [employer]. Go on 12/21/2023.   Specialty: Radiology Why: Please arrive by 8:45 am in order to have PA/LAT CXR PRIOR to appointment with Dr. Kerrin Pass information: [address] [place]  [address]        Trudy Birmingham, PA-C Follow up on 11/26/2023.   Specialty: Cardiology  Why: Cardiology appointment is at 10:55AM Contact information: [address] [place] [address] [phone]                 Signed:  Con GORMAN Bend, PA-C  11/13/2023, 10:10 AM

## 2023-11-10 NOTE — Progress Notes (Signed)
 2 Days Post-Op Procedure(s) (LRB): CORONARY ARTERY BYPASS GRAFTING X TWO USING LEFT INTERNAL MAMMARY ARTERY AND LEFT RADIAL ARTERY (N/A) ECHOCARDIOGRAM, TRANSESOPHAGEAL, INTRAOPERATIVE (N/A) SURGICAL PROCUREMENT, ARTERY, RADIAL (Left) Subjective: C/o incisional pain and some nausea  Objective: Vital signs in last 24 hours: Temp:  [97.8 F (36.6 C)-99.3 F (37.4 C)] 99.1 F (37.3 C) (06/25 0600) Pulse Rate:  [74-94] 81 (06/25 0600) Cardiac Rhythm: Normal sinus rhythm (06/25 0000) Resp:  [17-41] 23 (06/25 0600) BP: (85-113)/(53-76) 85/53 (06/25 0600) SpO2:  [82 %-100 %] 96 % (06/25 0600) Arterial Line BP: (94-150)/(34-71) 131/41 (06/24 1545) Weight:  [82.3 kg] 82.3 kg (06/25 0500)  Hemodynamic parameters for last 24 hours:    Intake/Output from previous day: 06/24 0701 - 06/25 0700 In: 925.1 [I.V.:350.1; IV Piggyback:575] Out: 1380 [Urine:880; Chest Tube:500] Intake/Output this shift: No intake/output data recorded.  General appearance: alert, cooperative, and no distress Neurologic: intact Heart: regular rate and rhythm Lungs: diminished breath sounds bibasilar Abdomen: normal findings: mildly distended, nontender, + BS  Lab Results: Recent Labs    11/09/23 1653 11/10/23 0413  WBC 5.1 4.7  HGB 8.9* 9.0*  HCT 27.7* 27.1*  PLT 104* 115*   BMET:  Recent Labs    11/09/23 1653 11/10/23 0413  NA 127* 128*  K 4.8 4.7  CL 99 100  CO2 21* 22  GLUCOSE 254* 134*  BUN 12 12  CREATININE 0.81 0.84  CALCIUM  8.0* 8.0*    PT/INR:  Recent Labs    11/08/23 1730  LABPROT 18.2*  INR 1.5*   ABG    Component Value Date/Time   PHART 7.294 (L) 11/09/2023 0012   HCO3 21.0 11/09/2023 0012   TCO2 22 11/09/2023 0012   ACIDBASEDEF 5.0 (H) 11/09/2023 0012   O2SAT 99 11/09/2023 0012   CBG (last 3)  Recent Labs    11/09/23 1928 11/09/23 2351 11/10/23 0803  GLUCAP 227* 215* 175*    Assessment/Plan: S/P Procedure(s) (LRB): CORONARY ARTERY BYPASS GRAFTING X TWO  USING LEFT INTERNAL MAMMARY ARTERY AND LEFT RADIAL ARTERY (N/A) ECHOCARDIOGRAM, TRANSESOPHAGEAL, INTRAOPERATIVE (N/A) SURGICAL PROCUREMENT, ARTERY, RADIAL (Left) POD # 2 NEURO- intact CV- in SR, BP low 100s- continue metoprolol  and Norvasc  ASA, add Plavix tomorrow  Rosuvastatin   Dc pacing wires RESP- IS for basilar atelectasis RENAL- creatinine normal, weight above preop but can't diurese at current BP ENDO- CBG elevated  Resume metformin   Continue SemGlee  Change SSI to AC and HS GI- nausea- continue Reglan another day  PRN odansetron  Protonix for ulcer prophylaxis Anemia- secondary to ABL, stable. Monitor Thrombocytopenia- PLT up to 115K Pain control- continue Tylenol , PRN oxycodone  Continue ketorolac today  Dc chest tubes Cardiac rehab Transfer to 4E     LOS: 6 days    [name] 11/10/2023

## 2023-11-10 NOTE — Progress Notes (Signed)
 Patient arrived at the unit from 2H,upon arrival pt is alert and oriented X4,chg bath given,vitals taken,CCMD notified,pt oriented to the unit,call bell in reach

## 2023-11-11 ENCOUNTER — Inpatient Hospital Stay (HOSPITAL_COMMUNITY)

## 2023-11-11 LAB — BASIC METABOLIC PANEL WITH GFR
Anion gap: 7 (ref 5–15)
BUN: 17 mg/dL (ref 6–20)
CO2: 21 mmol/L — ABNORMAL LOW (ref 22–32)
Calcium: 8.1 mg/dL — ABNORMAL LOW (ref 8.9–10.3)
Chloride: 99 mmol/L (ref 98–111)
Creatinine, Ser: 0.92 mg/dL (ref 0.61–1.24)
GFR, Estimated: >60 mL/min (ref >60)
Glucose, Bld: 142 mg/dL — ABNORMAL HIGH (ref 70–99)
Potassium: 5.2 mmol/L — ABNORMAL HIGH (ref 3.5–5.1)
Sodium: 127 mmol/L — ABNORMAL LOW (ref 135–145)

## 2023-11-11 LAB — CBC
HCT: 25.7 % — ABNORMAL LOW (ref 39.0–52.0)
Hemoglobin: 8.5 g/dL — ABNORMAL LOW (ref 13.0–17.0)
MCH: 28.5 pg (ref 26.0–34.0)
MCHC: 33.1 g/dL (ref 30.0–36.0)
MCV: 86.2 fL (ref 80.0–100.0)
Platelets: 116 10*3/uL — ABNORMAL LOW (ref 150–400)
RBC: 2.98 MIL/uL — ABNORMAL LOW (ref 4.22–5.81)
RDW: 13 % (ref 11.5–15.5)
WBC: 4.5 10*3/uL (ref 4.0–10.5)
nRBC: 0 % (ref 0.0–0.2)

## 2023-11-11 LAB — GLUCOSE, CAPILLARY
Glucose-Capillary: 139 mg/dL — ABNORMAL HIGH (ref 70–99)
Glucose-Capillary: 183 mg/dL — ABNORMAL HIGH (ref 70–99)
Glucose-Capillary: 170 mg/dL — ABNORMAL HIGH (ref 70–99)
Glucose-Capillary: 254 mg/dL — ABNORMAL HIGH (ref 70–99)
Glucose-Capillary: 220 mg/dL — ABNORMAL HIGH (ref 70–99)
Glucose-Capillary: 212 mg/dL — ABNORMAL HIGH (ref 70–99)

## 2023-11-11 MED ORDER — ASPIRIN 81 MG PO TBEC
81.0000 mg | DELAYED_RELEASE_TABLET | Freq: Every day | ORAL | Status: DC
  Administered 2023-11-11 – 2023-11-13 (×3): 81 mg via ORAL
  Filled 2023-11-11 (×3): qty 1

## 2023-11-11 MED ORDER — CLOPIDOGREL BISULFATE 75 MG PO TABS
75.0000 mg | ORAL_TABLET | Freq: Every day | ORAL | Status: DC
  Administered 2023-11-11 – 2023-11-13 (×3): 75 mg via ORAL
  Filled 2023-11-11 (×3): qty 1

## 2023-11-11 MED ORDER — FUROSEMIDE 20 MG PO TABS
20.0000 mg | ORAL_TABLET | Freq: Once | ORAL | Status: AC
  Administered 2023-11-11: 20 mg via ORAL
  Filled 2023-11-11: qty 1

## 2023-11-11 MED ORDER — LACTULOSE 10 GM/15ML PO SOLN
20.0000 g | Freq: Once | ORAL | Status: AC
  Administered 2023-11-11: 20 g via ORAL
  Filled 2023-11-11: qty 30

## 2023-11-11 MED ORDER — ASPIRIN 81 MG PO CHEW
81.0000 mg | CHEWABLE_TABLET | Freq: Every day | ORAL | Status: DC
  Filled 2023-11-11: qty 1

## 2023-11-11 MED FILL — Heparin Sodium (Porcine) Inj 1000 Unit/ML: Qty: 1000 | Status: AC

## 2023-11-11 MED FILL — Potassium Chloride Inj 2 mEq/ML: INTRAVENOUS | Qty: 40 | Status: AC

## 2023-11-11 MED FILL — Magnesium Sulfate Inj 50%: INTRAMUSCULAR | Qty: 10 | Status: AC

## 2023-11-11 NOTE — Progress Notes (Addendum)
                  [address]           Marcus Hart [place], [place] [address]                     [phone]        3 Days Post-Op Procedure(s) (LRB): CORONARY ARTERY BYPASS GRAFTING X TWO USING LEFT INTERNAL MAMMARY ARTERY AND LEFT RADIAL ARTERY (N/A) ECHOCARDIOGRAM, TRANSESOPHAGEAL, INTRAOPERATIVE (N/A) SURGICAL PROCUREMENT, ARTERY, RADIAL (Left)  Subjective: Patient passing flatus but no bowel movement yet. He had incisional/sternal pain earlier but took Tylenol  which helped  Objective: Vital signs in last 24 hours: Temp:  [97.3 F (36.3 C)-99.1 F (37.3 C)] 97.9 F (36.6 C) (06/26 0300) Pulse Rate:  [73-89] 77 (06/26 0300) Cardiac Rhythm: Normal sinus rhythm (06/25 1858) Resp:  [14-25] 17 (06/26 0300) BP: (91-123)/(59-76) 93/59 (06/26 0300) SpO2:  [91 %-99 %] 95 % (06/26 0300)  Pre op weight 74.5 kg Current Weight  11/10/23 82.3 kg     Intake/Output from previous day: 06/25 0701 - 06/26 0700 In: 360 [P.O.:360] Out: 460 [Urine:430; Chest Tube:30]   Physical Exam:  Cardiovascular: RRR Pulmonary: Slightly diminished bibasilar breath sounds Abdomen: Soft, non tender, bowel sounds present. Extremities: Mild bilateral lower extremity edema. Motor/sensory intact Wounds: Sternal and LUE dressing removed and wounds are clean and dry.  No erythema or signs of infection.  Lab Results: CBC: Recent Labs    11/10/23 0413 11/11/23 0359  WBC 4.7 4.5  HGB 9.0* 8.5*  HCT 27.1* 25.7*  PLT 115* 116*   BMET:  Recent Labs    11/10/23 0413 11/11/23 0359  NA 128* 127*  K 4.7 5.2*  CL 100 99  CO2 22 21*  GLUCOSE 134* 142*  BUN 12 17  CREATININE 0.84 0.92  CALCIUM  8.0* 8.1*    PT/INR:  Lab Results  Component Value Date   INR 1.5 (H) 11/08/2023   INR 1.0 11/08/2023   ABG:  INR: Will add last result for INR, ABG once components are confirmed Will add last 4 CBG results once components are confirmed  Assessment/Plan:  1. CV - S/p NSTEMI. SR. On  Amlodipine 5 mg daily (radial artery harvest) and Lopressor  12.5 mg bid. Will decrease ec asa to 81 mg daily and start Plavix for ACS. 2.  Pulmonary - On 1 liter of oxygen via [place]. Wean as able.  PA/LAT CXR this am appears to show bibasilar atelectasis. Encourage incentive spirometer 3. Above pre op weight, requires further diuresis-has labile BP this am but will order for later today 4.  Expected post op acute blood loss anemia - H and H this am slightly decreased to 8.5 and 25.7 5. DM-CBGs 190/217/170. On Metformin  1000 mg bid. Pre op HGA1C 7.6. He will need close medical follow up after discharge 6. Hyponatremia (likely hypervolemia)-has had post op. Sodium this am 127 7. Mild hyperkalemia-potassium this am 5.2. Will be given low dose Lasix later this am 8. Mild thrombocytopenia-platelet this am 116,000 9. LOC constipation  Marcus M ZimmermanPA-C 6:56 AM Patient seen and examined, agree with above IS for atelectasis Ambulate Possibly home tomorrow  Marcus Hart. Kerrin, MD [facility] [phone]

## 2023-11-11 NOTE — Inpatient Diabetes Management (Signed)
 Inpatient Diabetes Program Recommendations  AACE/ADA: New Consensus Statement on Inpatient Glycemic Control (2015)  Target Ranges:  Prepandial:   less than 140 mg/dL      Peak postprandial:   less than 180 mg/dL (1-2 hours)      Critically ill patients:  140 - 180 mg/dL   Lab Results  Component Value Date   GLUCAP 254 (H) 11/11/2023   HGBA1C 7.6 (H) 11/04/2023    Review of Glycemic Control  Latest Reference Range & Units 11/10/23 08:03 11/10/23 13:16 11/10/23 16:11 11/10/23 17:24 11/10/23 21:31 11/11/23 06:09 11/11/23 12:00  Glucose-Capillary 70 - 99 mg/dL 824 (H) 798 (H) 796 (H) 190 (H) 217 (H) 170 (H) 254 (H)   Diabetes history: DM 2 Outpatient Diabetes medications: Trulicity  3 mg weekly, metformin  1000 mg bid Current orders for Inpatient glycemic control:  Semglee 12 units bid Novolog  0-15 units tid + hs Metformin  1000 mg bid  Decreased PO intake at 20% for most recent meals  Inpatient Diabetes Program Recommendations:    -   Increase Semglee to 14 units bid  Thanks,  Clotilda Bull RN, MSN, BC-ADM Inpatient Diabetes Coordinator Team Pager [phone] (8a-5p)

## 2023-11-11 NOTE — Progress Notes (Signed)
 CARDIAC REHAB PHASE I    Stopped by to offer walk in hallway. Pt declined for now related to pain. Will ask RN for pain medications. Encouraged increased mobility and IS use. Will return to offer walk later today as time permits. Will continue to follow.    Vaughn Asberry Hacking, RN BSN 11/11/2023 11:22 AM

## 2023-11-12 ENCOUNTER — Other Ambulatory Visit (HOSPITAL_COMMUNITY): Payer: Self-pay | Admitting: Cardiology

## 2023-11-12 DIAGNOSIS — Z006 Encounter for examination for normal comparison and control in clinical research program: Secondary | ICD-10-CM

## 2023-11-12 LAB — BASIC METABOLIC PANEL WITH GFR
Anion gap: 10 (ref 5–15)
BUN: 13 mg/dL (ref 6–20)
CO2: 24 mmol/L (ref 22–32)
Calcium: 8.8 mg/dL — ABNORMAL LOW (ref 8.9–10.3)
Chloride: 99 mmol/L (ref 98–111)
Creatinine, Ser: 0.98 mg/dL (ref 0.61–1.24)
GFR, Estimated: >60 mL/min (ref >60)
Glucose, Bld: 176 mg/dL — ABNORMAL HIGH (ref 70–99)
Potassium: 4.6 mmol/L (ref 3.5–5.1)
Sodium: 133 mmol/L — ABNORMAL LOW (ref 135–145)

## 2023-11-12 LAB — GLUCOSE, CAPILLARY
Glucose-Capillary: 169 mg/dL — ABNORMAL HIGH (ref 70–99)
Glucose-Capillary: 236 mg/dL — ABNORMAL HIGH (ref 70–99)
Glucose-Capillary: 219 mg/dL — ABNORMAL HIGH (ref 70–99)
Glucose-Capillary: 186 mg/dL — ABNORMAL HIGH (ref 70–99)

## 2023-11-12 MED ORDER — STUDY - SOS-AMI - SELATOGREL 16 MG/0.5 ML OR PLACEBO SQ INJECTION (PI-CHRISTOPHER): 16.0000 mg | INJECTION | Freq: Once | SUBCUTANEOUS | 0 refills | Status: DC

## 2023-11-12 MED ORDER — LACTULOSE 10 GM/15ML PO SOLN: 20.0000 g | Freq: Every day | ORAL | Status: DC | PRN

## 2023-11-12 MED ORDER — INSULIN GLARGINE-YFGN 100 UNIT/ML ~~LOC~~ SOLN
14.0000 [IU] | Freq: Two times a day (BID) | SUBCUTANEOUS | Status: DC
  Administered 2023-11-12 – 2023-11-13 (×3): 14 [IU] via SUBCUTANEOUS
  Filled 2023-11-12 (×5): qty 0.14

## 2023-11-12 MED ORDER — FUROSEMIDE 40 MG PO TABS
40.0000 mg | ORAL_TABLET | Freq: Once | ORAL | Status: AC
  Administered 2023-11-12: 40 mg via ORAL
  Filled 2023-11-12: qty 1

## 2023-11-12 MED ORDER — LOPERAMIDE HCL 1 MG/7.5ML PO SUSP: 2.0000 mg | ORAL | Status: DC | PRN

## 2023-11-12 MED ORDER — POTASSIUM CHLORIDE CRYS ER 20 MEQ PO TBCR
20.0000 meq | EXTENDED_RELEASE_TABLET | Freq: Once | ORAL | Status: AC
  Administered 2023-11-12: 20 meq via ORAL
  Filled 2023-11-12: qty 1

## 2023-11-12 MED ORDER — STUDY - SOS-AMI - SELATOGREL 16 MG/0.5 ML OR PLACEBO SQ INJECTION (PI-CHRISTOPHER): 16.0000 mg | INJECTION | Freq: Once | SUBCUTANEOUS | Status: DC

## 2023-11-12 MED ORDER — STUDY - SOS-AMI - PLACEBO FOR SELATOGREL 0 MG/0.5 ML SQ INJECTION FOR SCREENING USE ONLY (PI-CHRISTOPHER)
0.5000 mL | INJECTION | Freq: Once | SUBCUTANEOUS | Status: AC
  Administered 2023-11-12: 0.5 mL via SUBCUTANEOUS
  Filled 2023-11-12: qty 0.5

## 2023-11-12 NOTE — Research (Signed)
 [id number], [id number]   Subject ID: [id number]   Authorized Site Personnel                                                  [x]   Marcus Rancher, RN []   Suzen Hardy, RN  []   Amy Ardean, RCIS  []   Delon Fetters, RN        Date of study introduction: 12-Nov-2023 Time of introduction: 1030   During the subject's hospital visit , the authorized site personnel discussed with the subject the possibility to participate in the SOS-AMI study, and provided  the subject with the approved Subject Information Leaflet (SIL)-Informed Consent form (ICF) in a language that he/she can understand.  The subject took the document home to reflect on his/her participation in this study before signing it.    []   The Subject will return to the Cardiovascular Research office, at a later day, to            complete the consenting process.  OR   [x]   The Subject will be given ample time to reflect on this study but will be completing            consent process prior to his/ her discharge from this admission.     SOS AMI Informed Consent   Subject Name: Marcus Hart  Subject met inclusion and exclusion criteria.  The informed consent form, study requirements and expectations were reviewed with the subject and questions and concerns were addressed prior to the signing of the consent form.  The subject verbalized understanding of the trial requirements.  The subject agreed to participate in the SOS AMI trial and signed the informed consent at 1104 on 27/Jun/2025.  The informed consent was obtained prior to performance of any protocol-specific procedures for the subject.  A copy of the signed informed consent was given to the subject and a copy was placed in the subject's medical record.   Marcus Hart         SOS-AMI ELIGIBILITY:  INCLUSION / EXCLUSION CRITERIA   Inclusion Criteria:  1. Signed and dated informed consent          []   No   [x]   Yes   75. >/= 57 years old (or age of majority  in local region).          []   No  [x]   Yes   3.  Confirmed diagnosis of symptomatic type I AMI no longer than 4 weeks prior to randomization [] No  [x]   Yes   4. Diagnosis of multivessel coronary artery disease defined as  >/= stenosis on 2 or more coronary artery territories or on the left main artery during a prior cardiac catheterization or  Cardiac catheterization during the qualifying AMI event and    a. Second prior AMI.[x]   No  []   Yes b. Diabetes mellitus defined by ongoing glucose-lowering treatment.[]   No  [x]   Yes c. CKD defined as eGFR < 60 mL/min/1.73 m2 and either known history of CKD or a biomarker of chronic kidney damage [see full definition in Section [x]   No  []   Yes d. PAD defined as any of the following at any time prior to randomization: ? Ankle/brachial index < 0.85. ? Lower extremities artery stenosis > 50% documented by angiography (invasive or non-invasive) or by duplex ultrasound. ?  Amputation, peripheral bypass, or vascular procedures (e.g., angioplasty or endarterectomy) of the lower extremities secondary to ischemia.[x]   No  []   Yes e. Absence of, or unsuccessful coronary revascularization of the qualifying AMI referred to in inclusion criterion [x]   No  []   Yes   5.  Subject having successfully self-administered placebo during screening. []   No  [x]   Yes   6.  Women of childbearing potential who fulfill the following criteria:       - Negative pregnancy test (Urine or Serum) at randomization     [x]   No  []   Yes      - Agreement to use an acceptable contraceptive method.           [x]   No  []   Yes   Exclusion Criteria:  1.  Increased risk of serious bleeding including any of the following:        A. History of intracranial bleed.      [x]   No  []   Yes     B. Known uncorrected intracranial vascular abnormality   [x]   No  []   Yes     C. Gastrointestinal bleed requiring hospitalization or transfusion            Within 1 year prior to screening          [x]    No  []   Yes     D. Subjects on oral triple antithrombotic therapy   [x]   No  []   Yes     E. Known liver impairment significantly affecting hepatic function            [x]   No  []   Yes     F. Current dialysis               [x]   No  []   Yes     G. Ischemic stroke or transient ischemic attack within 3 months of screening     [x]  No  []  Yes   2. Chronic anemia with hemoglobin <10 g/dL            [x]   No  []   Yes   3. Chronic thrombocytopenia with platelet count <100,000 /mm3.      [x]   No  []   Yes   4. Concomitant diseases or conditions that in the opinion of the investigator are           not compatible with study participation.   [x]   No  []  Yes            5. Known hypersensitivity to selatogrel,m any of its excipients, or drugs           of the P2Y12 class.        [x]   No  []   Yes   6. Previous exposure to an investigational drug within 3 months            prior to randomization     [x]   No  []   Yes    7. Participation in another clinical trial with an investigational product             or device within 3 months prior to randomization    [x]   No   []   Yes   8.  Pregnant, planning to become pregnant, or lactating women  [x]   No  []   Yes   9.  Known concomitant life-threatening disease with a  life expectancy <12 months         [x]   No  []   Yes     DID SUBJECT MEET ALL THE ELIGIBILITY CRITERIA?  []  NO   [x]  YES   If NO, please list Inclusion/Exclusion criteria number(s) not met ________________     INCLUSION / EXCLUSION CRITERIA REVIEWED BY:     []  Dr. Shelda Bruckner                                                                                               [x]  Dr. Debby Como         Enrolment:             1.1       Screening Date    27/Jun/2025             1.2       Sex      Male []     Male   [x]              1.3       Age at screening         58 years   Was Subject randomized:   [x]  Yes  []  No         If No, select reason:           []  Not eligible as per inclusion/exclusion criteria                                                 []  Withdrawal by subject                                                 []   Withdrawal by parent / guardian                                                 []  Adverse event                                                  []  Lost to follow-up                                                 []  Death                                                 []   Other: ___________________                    2.  Randomization:                 2.1       Randomization Date   27/Jun/2025             2.2Randomization Number       [id number]             2.3       Stratification 1                                      []   Background oral P2Y12 receptor antagonist: None                                     [x]   Background oral P2Y12 receptor antagonist: Clopidogrel                                      []   Background oral P2Y12 receptor antagonist: Prasugrel                                     []   Background oral P2Y12 receptor antagonist: Ticagrelor   3. Screening Failure:             3.1       Screen Failure Date                DD/MM/YYYY   4. Discontinuation                  4.1       Subject's discontinuation date            DD/MM/YYYY         P1. Qualifying AMI Characteristics     1.1  Diagnosis Date    19/Jun/2025       1.2  Diagnosis    []  STEMI  [x]   NSTEMI  []   Other           If other, please specify ___________________________________________       1.3  [place] class  []  I []  II []  III  []  IV  [x]  UNK       1.4  Were revascularization procedures performed?         []   No  [x]   Yes       1.5  Peak cTn value     412     1.6  Type    []  Troponin T  []  Troponin I                         []  High Sensitivity Troponin T    [x]   High Sensitivity Troponin I                         []  Unknown    1.7  Units  []  g/L   [x]  ng/L  []  ng/mL  []  ug/L []  Unknown  Other, please specify  ______________    1.8 Upper Range Limit   18      1.9  Date of discharge:       2. Medications at discharge    2.1   Organic Nitrates           []   No  [x]   Yes    2.2  Beta-blocker                  []   No  [x]   Yes    2.3  ACE inhibitor                 [x]   No  []   Yes    2.4  Angiotensin II receptor blocker             [x]   No  []   Yes    2.5  Calcium  channel blocker          []   No  [x]   Yes    2.6  Aldosterone receptor blocker   [x]   No  []   Yes    2.7  Statin                             []   No  [x]   Yes    2.8  PCSK9 inhibitors          [x]   No  []   Yes                                                                                                                         No current facility-administered medications for this visit.  Current Outpatient Medications:    Study - SOS-AMI - selatogrel 16 mg/0.5 mL or placebo SQ injection (PI-Christopher), Inject 0.5 mLs (16 mg total) into the skin once for 1 dose., Disp: 1 mL, Rfl: 0  Facility-Administered Medications Ordered in Other Visits:    acetaminophen  (TYLENOL ) tablet 1,000 mg, 1,000 mg, Oral, Q6H, 1,000 mg at 11/12/23 1205 **OR** acetaminophen  (TYLENOL ) 160 MG/5ML solution 1,000 mg, 1,000 mg, Per Tube, Q6H, Kerrin Elspeth BROCKS, MD   ALPRAZolam  (XANAX ) tablet 0.25 mg, 0.25 mg, Oral, Q6H PRN, Hendrickson, Steven C, MD   alum & mag hydroxide-simeth (MAALOX/MYLANTA) 200-200-20 MG/5ML suspension 15 mL, 15 mL, Oral, Q6H PRN, Hendrickson, Steven C, MD   amLODipine  (NORVASC ) tablet 5 mg, 5 mg, Oral, Daily, Kerrin Elspeth BROCKS, MD, 5 mg at 11/12/23 9061   aspirin  EC tablet 81 mg, 81 mg, Oral, Daily, 81 mg at 11/12/23 9061 **OR** aspirin  chewable tablet 81 mg, 81 mg, Per Tube, Daily, Zimmerman, Donielle M, PA-C   bisacodyl  (DULCOLAX) EC tablet 10 mg, 10 mg, Oral, Daily, 10 mg at 11/11/23 0925 **OR** bisacodyl  (DULCOLAX) suppository 10 mg, 10 mg, Rectal, Daily, Kerrin Elspeth BROCKS, MD   Chlorhexidine  Gluconate Cloth 2 % PADS 6 each, 6  each, Topical, Daily, Kerrin Elspeth BROCKS, MD, 6 each at  11/12/23 0939   clopidogrel  (PLAVIX ) tablet 75 mg, 75 mg, Oral, Daily, Zimmerman, Donielle M, PA-C, 75 mg at 11/12/23 9062   docusate sodium  (COLACE) capsule 200 mg, 200 mg, Oral, Daily, Kerrin Elspeth BROCKS, MD, 200 mg at 11/12/23 9061   insulin  aspart (novoLOG ) injection 0-15 Units, 0-15 Units, Subcutaneous, TID WC, Hendrickson, Steven C, MD, 5 Units at 11/12/23 1211   insulin  aspart (novoLOG ) injection 0-5 Units, 0-5 Units, Subcutaneous, QHS, Hendrickson, Steven C, MD, 2 Units at 11/11/23 2216   insulin  glargine-yfgn (SEMGLEE ) injection 14 Units, 14 Units, Subcutaneous, BID, Raguel Con RAMAN, PA-C, 14 Units at 11/12/23 1059   magnesium  hydroxide (MILK OF MAGNESIA) suspension 30 mL, 30 mL, Oral, Daily PRN, Kerrin Elspeth BROCKS, MD   metFORMIN  (GLUCOPHAGE ) tablet 1,000 mg, 1,000 mg, Oral, BID WC, Hendrickson, Steven C, MD, 1,000 mg at 11/12/23 9061   metoprolol  tartrate (LOPRESSOR ) tablet 12.5 mg, 12.5 mg, Oral, BID, 12.5 mg at 11/12/23 9061 **OR** [DISCONTINUED] metoprolol  tartrate (LOPRESSOR ) 25 mg/10 mL oral suspension 12.5 mg, 12.5 mg, Per Tube, BID, Gold, Wayne E, PA-C   mupirocin  ointment (BACTROBAN ) 2 % 1 Application, 1 Application, Nasal, BID, Hendrickson, Steven C, MD, 1 Application at 11/12/23 9060   ondansetron  (ZOFRAN ) injection 4 mg, 4 mg, Intravenous, Q6H PRN, Kerrin Elspeth BROCKS, MD, 4 mg at 11/10/23 0757   Oral care mouth rinse, 15 mL, Mouth Rinse, PRN, Kerrin Elspeth BROCKS, MD   oxyCODONE  (Oxy IR/ROXICODONE ) immediate release tablet 5-10 mg, 5-10 mg, Oral, Q3H PRN, Hendrickson, Steven C, MD, 5 mg at 11/12/23 0944   pantoprazole  (PROTONIX ) EC tablet 40 mg, 40 mg, Oral, Daily, Hendrickson, Steven C, MD, 40 mg at 11/12/23 9061   rosuvastatin  (CRESTOR ) tablet 40 mg, 40 mg, Oral, Daily, Kerrin Elspeth BROCKS, MD, 40 mg at 11/12/23 9061   sodium chloride  flush (NS) 0.9 % injection 3 mL, 3 mL, Intravenous, Q12H, Kerrin Elspeth BROCKS, MD, 3 mL at 11/12/23 9060   sodium chloride  flush (NS) 0.9 % injection 3 mL, 3 mL, Intravenous, PRN, Kerrin Elspeth BROCKS, MD      SCREENING: PATIENT TRAINING- DEMONSTRATION DEVICE    Was the training delivered?    []  NO  [x]  YES             1.1 Date of training: 27/Jun/2025             1.2 Start time: 1320    24 hour clock             1.3 Stop time: 1330    24 hour clock                            Difficulties in using the autoinjector for the DEMONSTRATION DEVICE self-injection:   3. Were there any difficulties in performing the placebo self-injection? [x]  NO  []  YES    IF NO, DO NOT ANSWER THE FOLLOWING QUESTIONS   Difficulties with Step1: Choose an injection site?   3.1.1.   Was the injection site as defined in the protocol (abdomen/thigh)?  []  NO  [x]   YES 3.1.2    Was the injection done on bare skin?            []  NO  [x]   YES 3.1.3    Did the subject report any other difficulties in choosing injection site?         [x]  NO  []  YES  If YES, Please specify: ______________________   Difficulties with Step 2: Twist cap off?   3.2.1    Did the subject twist the cap to remove it?     []  NO  [x]   YES 3.2.2    Did the subject twist the cap counterclockwise?        []   NO  [x]   YES 3.2.3    Did the force/ torque applied sufficient to twist the cap off?  []   NO  [x]   YES 3.2.4    Did the subject report any other difficulties in twisting the cap off?   [x]   NO  []  YES                If YES, please specify:___________________   Difficulties with Step 3: Pinch skin and place the autoinjector:   3.3.1    Did the subject pinch skin at injection site?    []   NO  [x]   YES 3.3.2    Did the subject place the autoinjector perpendicular to the skin?     []   NO  [x]   YES 3.3.3    Did the subject report any other difficulties pinching the skin and placing                  the autoinejctor?      [x]   NO  []   YES   Difficulties with Step 4: Firmly puch down and hold for  3 seconds   3.4.1    Did the subject attempt to inject with the autoinjector in the right position,                 i.e needle end down?          []   NO  [x]   YES 3.4.2    Did the subject push down firmly until it clicked?       []  NO  [x]   YES 3.4.3    Did the subject hold the autoinjector for about 3 seconds or until the                 Viewing window turned orange?     []   NO  [x]  YES 3.4.4    Did the subject report any other difficulties pushing firmly down?     [x]  NO  []   YES                If YES, please specify: _________________         Patient Training & Self-injection of PLACEBO  1. Was the training delivered?            []   No  [x]   Yes      1.1  Date of training    27/Jun/2025      1.2  Start time     1335 (24 hour clock)      1.3  Stop time    1400   (24 hour clock)       2.  Did the placebo self-injection occur?     []   No  [x]   Yes      2.1 Autoinjector ID / Label           Difficulties in using the autoinjector for the placebo self-injection    3.  Were there any difficulties in performing the placebo self-injection         [x]   No  []   Yes  IF NO, DO NOT ANSWER THE FOLLOWING QUESTIONS   Difficulties with Step 1: Choose an injection site     3.1.1  Was the injection site as defined in the protocol (abdomen / thigh)?  []   No  [x]   Yes     3.1.2. Was the injection done on bare skin?          []   No  []   Yes     3.1.3  Did the subject report any other difficulties choosing                      injection site ?                 []   No  []   Yes, please specify _____________________   Difficulties with Step 2: Twist cap off             3.2.1  Did the subject twist the cap to remove it?               []   No  [x]   Yes    3.2.2  Did the subject twist the cap counterclockwise?       []   No  [x]   Yes    3.2.3  Did the force/ torque applied sufficient to twist the cap off?             []   No  [x]   Yes    3.2.4  Did the subject report any other difficulties in twisting                 the cap off?   [x]   No  []   Yes, please specify ________________________________   Difficulties with Step 3: Pinch skin and place the autoinjector    3.3.1  Did the subject pinch skin at injection site?              []   No  [x]   Yes    3.3.2  Did the subject place the autoinjector perpendicularly to the skin? []   No  [x]   Yes    3.3.3  Did the subject report any other difficulties pinching the skin                and placing the autoinjector?    [x]   No  []   Yes, please specify ____________________   Difficulties with Step 4: Firmly push down and hold for 3 seconds    3.4.1  Did the subject attempt to inject with the autoinjector in the right                position, I.e. needle end down?      []  No  [x]   Yes    3.4.2  Did the subject push down firmly until it clicked?      []   No  [x]   Yes    3.4.3  Did the subject hold the autoinjector for about 3 seconds                 or until the viewing window turned orange?           []   No  [x]   Yes    3.4.4  Did the subject report any other difficulties pushing                 firmly down ?          [x]   No  []   Yes, Please  specify __________________________      INITIAL PATIENT TRAINING   Q1, Did someone (e.g. caregiver, family member) attend the training session             together with the patient?    []  No  [x]   Yes         If yes, please note name phone number and address of other person: Laymon Lias, spouse [phone]    Q2. Was the following information provided to the subject?             [x]   Heart attack symptoms             [x]   How to act (inject and follow-up actions to be taken)             [x]   Use of the demo device, including label instructions and IFU             [x]   How to perform the placebo self-injection         Q3  Did the subject correctly reply to the wrap-up questions?              A.  What are common heart attack symptoms?    []   No  [x]   Yes             B.  What has to be done in  case any of those symptoms occurs?  []  No  [x]  Yes        Make note of any misconceptions and clarifications given: ______________________________________________________________________   Q4  Where will the subject keep/store the study autoinjectors? On him

## 2023-11-12 NOTE — Progress Notes (Signed)
 CARDIAC REHAB PHASE I    Pt resting in bed, feeling well today. Reports walking in hallway 2 times with wife assistance, no walker needed. Reports tolerated well with no dizziness, pain and mild SOB. Post OHS education including site care, restrictions, heart healthy diabetic diet, sternal precautions, IS use at home, home needs at discharge, exercise guidelines and CRP2 reviewed. All questions and concerns addressed. Will refer to [facility] for CRP2. Will continue to follow.   1100-1200 Vaughn Asberry Hacking, RN BSN 11/12/2023 12:01 PM

## 2023-11-12 NOTE — Research (Signed)
 ID- [id number],  SOS-AMI  SUBJECT INFORMED CONSENT PROCESS   Subject Consented by:   []   Principal Investigator:  Shelda Bruckner, MD []   Sub-Investigator: Debby Como, MD [x]   Research Nurse: Suzen Hardy, RN  []   Research Nurse: Delon Fetters, RN   Consenting Process:   [x]   The above named investigator/' Research Nurse explained the study and SIL-ICF,           in their entirety,  to the Subject and Any / All third parties deemed necessary by the subject.  [x]   All medical questions, pertaining to the Study, were asked and fully answered.    [x]   Subject voiced understanding of the Informed Consent contents and is        agreeable to proceed with the SOS-AMI study.   Both the Subject and Investigator/ Research Nurse signed the SIL-ICF on:    Date of Consent: _27/JUN/2025_   (DD-[id number]) Time of Consent: __1104_  (24 hour clock)  [x]  No study related assessment or testing has been performed prior to the        Subject's ICF signature.   [x]  A signed copy of the SIL-ICF was given to the subject/ legal representative,         as applicable.   Investigator / Research Nurse Comments:

## 2023-11-12 NOTE — Progress Notes (Addendum)
 [address]       [place] [address]             [phone]      4 Days Post-Op Procedure(s) (LRB): CORONARY ARTERY BYPASS GRAFTING X TWO USING LEFT INTERNAL MAMMARY ARTERY AND LEFT RADIAL ARTERY (N/A) ECHOCARDIOGRAM, TRANSESOPHAGEAL, INTRAOPERATIVE (N/A) SURGICAL PROCUREMENT, ARTERY, RADIAL (Left) Subjective: Patient states he felt good yesterday but does not feel as good this AM. He feels worn out and short of breath after walking. Walked once in the hall yesterday.  Objective: Vital signs in last 24 hours: Temp:  [98 F (36.7 C)-98.9 F (37.2 C)] 98.3 F (36.8 C) (06/27 0328) Pulse Rate:  [83-100] 93 (06/27 0556) Cardiac Rhythm: Sinus tachycardia (06/26 1900) Resp:  [18-28] 21 (06/27 0556) BP: (101-128)/(59-75) 113/75 (06/27 0328) SpO2:  [91 %-100 %] 95 % (06/27 0556) Weight:  [69.9 kg] 69.9 kg (06/27 0531)  Hemodynamic parameters for last 24 hours:    Intake/Output from previous day: 06/26 0701 - 06/27 0700 In: 840 [P.O.:840] Out: -  Intake/Output this shift: No intake/output data recorded.  General appearance: alert, cooperative, and no distress Neurologic: intact Heart: regular rate and rhythm, S1, S2 normal, no murmur, click, rub or gallop Lungs: diminished left basilar breath sounds Abdomen: soft, non-tender; bowel sounds normal; no masses,  no organomegaly Extremities: extremities normal, atraumatic, no cyanosis or edema Wound: Clean and dry without sign of infection  Lab Results: Recent Labs    11/10/23 0413 11/11/23 0359  WBC 4.7 4.5  HGB 9.0* 8.5*  HCT 27.1* 25.7*  PLT 115* 116*   BMET:  Recent Labs    11/11/23 0359 11/12/23 0343  NA 127* 133*  K 5.2* 4.6  CL 99 99  CO2 21* 24  GLUCOSE 142* 176*  BUN 17 13  CREATININE 0.92 0.98  CALCIUM  8.1* 8.8*    PT/INR: No results for input(s): LABPROT, INR in the last 72 hours. ABG    Component Value Date/Time   PHART 7.294 (L) 11/09/2023 0012   HCO3 21.0  11/09/2023 0012   TCO2 22 11/09/2023 0012   ACIDBASEDEF 5.0 (H) 11/09/2023 0012   O2SAT 99 11/09/2023 0012   CBG (last 3)  Recent Labs    11/11/23 1531 11/11/23 2150 11/12/23 0612  GLUCAP 220* 212* 169*    Assessment/Plan: S/P Procedure(s) (LRB): CORONARY ARTERY BYPASS GRAFTING X TWO USING LEFT INTERNAL MAMMARY ARTERY AND LEFT RADIAL ARTERY (N/A) ECHOCARDIOGRAM, TRANSESOPHAGEAL, INTRAOPERATIVE (N/A) SURGICAL PROCUREMENT, ARTERY, RADIAL (Left)  CV: Hx of NSTEMI, on Plavix . SBP 110s on Norvasc  for radial artery conduit and Lopressor  12.5mg  BID. NSR HR 80s this AM.   Pulm: Saturating well on 2L [place] this AM, still saturating 100% when weaned to 1L [place]. CXR yesterday with bibasilar atelectasis and likely trace bilateral pleural effusions. Wean to RA for O2 greater than 90%. Encourage IS and ambulation.   GI: +BM yesterday, tolerating a diet.   Endo: CBGs 220/212/169. Will increase Semglee  to 14U BID. Mild hyponatremia likely due to hypervolemia, Na 133 improved today after diuresis yesterday.   Renal: Cr 0.98, no UO recorded. Weight recorded is not accurate, patient and wife report he was 79kg today, -2kg from yesterday. +2kg from preop. Will give PO Lasix  40mg  today since BP has slightly improved.   ID: No leukocytosis, afebrile.   Expected postop ABLA: Last H/H 8.5/25.7, overall stable. Not clinically significant at this time. Reactive thrombocytopenia, plt 116,000. Stable. Will clinically monitor.   Dispo: Patient  needs to walk more today. Will diurese and wean off oxygen. Hopefully can d/c home tomorrow AM.    LOS: 8 days   Patient seen and examined, agree with above Needs to ambulate  Elspeth C. Kerrin, MD [facility] [phone]   Con GORMAN Bend, PA-C 11/12/2023

## 2023-11-13 ENCOUNTER — Other Ambulatory Visit (HOSPITAL_COMMUNITY): Payer: Self-pay

## 2023-11-13 DIAGNOSIS — I214 Non-ST elevation (NSTEMI) myocardial infarction: Principal | ICD-10-CM

## 2023-11-13 LAB — GLUCOSE, CAPILLARY: Glucose-Capillary: 101 mg/dL — ABNORMAL HIGH (ref 70–99)

## 2023-11-13 MED ORDER — CLOPIDOGREL BISULFATE 75 MG PO TABS
75.0000 mg | ORAL_TABLET | Freq: Every day | ORAL | 1 refills | Status: DC
  Filled 2023-11-13: qty 30, 30d supply, fill #0

## 2023-11-13 MED ORDER — METOPROLOL TARTRATE 25 MG PO TABS
12.5000 mg | ORAL_TABLET | Freq: Two times a day (BID) | ORAL | 1 refills | Status: DC
  Filled 2023-11-13: qty 30, 30d supply, fill #0

## 2023-11-13 MED ORDER — AMLODIPINE BESYLATE 5 MG PO TABS
5.0000 mg | ORAL_TABLET | Freq: Every day | ORAL | 1 refills | Status: DC
  Filled 2023-11-13: qty 30, 30d supply, fill #0

## 2023-11-13 MED ORDER — OXYCODONE HCL 5 MG PO TABS
5.0000 mg | ORAL_TABLET | Freq: Four times a day (QID) | ORAL | 0 refills | Status: DC | PRN
  Filled 2023-11-13: qty 28, 7d supply, fill #0

## 2023-11-13 MED ORDER — ACETAMINOPHEN 325 MG PO TABS: 650.0000 mg | ORAL_TABLET | Freq: Four times a day (QID) | ORAL | Status: DC | PRN

## 2023-11-13 MED ORDER — ROSUVASTATIN CALCIUM 40 MG PO TABS
40.0000 mg | ORAL_TABLET | Freq: Every day | ORAL | 1 refills | Status: DC
  Filled 2023-11-13: qty 30, 30d supply, fill #0

## 2023-11-13 NOTE — Progress Notes (Signed)
 [address]       [place] [address]             [phone]      5 Days Post-Op Procedure(s) (LRB): CORONARY ARTERY BYPASS GRAFTING X TWO USING LEFT INTERNAL MAMMARY ARTERY AND LEFT RADIAL ARTERY (N/A) ECHOCARDIOGRAM, TRANSESOPHAGEAL, INTRAOPERATIVE (N/A) SURGICAL PROCUREMENT, ARTERY, RADIAL (Left) Subjective: Patient reports he did cough up white sputum yesterday on 1 occasion. Otherwise no complaints, ambulated 6 times in the halls.   Objective: Vital signs in last 24 hours: Temp:  [98.3 F (36.8 C)-99 F (37.2 C)] 98.3 F (36.8 C) (06/28 0310) Pulse Rate:  [86-101] 86 (06/28 0310) Cardiac Rhythm: Normal sinus rhythm (06/27 1914) Resp:  [20-29] 20 (06/28 0310) BP: (97-113)/(57-68) 113/68 (06/28 0310) SpO2:  [94 %-99 %] 99 % (06/28 0310) Weight:  [78.2 kg] 78.2 kg (06/28 0310)  Hemodynamic parameters for last 24 hours:    Intake/Output from previous day: No intake/output data recorded. Intake/Output this shift: No intake/output data recorded.  General appearance: alert, cooperative, and no distress Neurologic: intact Heart: regular rate and rhythm, S1, S2 normal, no murmur, click, rub or gallop Lungs: clear to auscultation bilaterally Abdomen: soft, non-tender; bowel sounds normal; no masses,  no organomegaly Extremities: extremities normal, atraumatic, no cyanosis or edema Wound: Clean and dry without sign of infection. Left arm with some numbness of the radial aspect of the forearm but hand is neurovascularly intact  Lab Results: Recent Labs    11/11/23 0359  WBC 4.5  HGB 8.5*  HCT 25.7*  PLT 116*   BMET:  Recent Labs    11/11/23 0359 11/12/23 0343  NA 127* 133*  K 5.2* 4.6  CL 99 99  CO2 21* 24  GLUCOSE 142* 176*  BUN 17 13  CREATININE 0.92 0.98  CALCIUM  8.1* 8.8*    PT/INR: No results for input(s): LABPROT, INR in the last 72 hours. ABG    Component Value Date/Time   PHART 7.294 (L) 11/09/2023 0012   HCO3 21.0  11/09/2023 0012   TCO2 22 11/09/2023 0012   ACIDBASEDEF 5.0 (H) 11/09/2023 0012   O2SAT 99 11/09/2023 0012   CBG (last 3)  Recent Labs    11/12/23 1640 11/12/23 2107 11/13/23 0630  GLUCAP 219* 186* 101*    Assessment/Plan: S/P Procedure(s) (LRB): CORONARY ARTERY BYPASS GRAFTING X TWO USING LEFT INTERNAL MAMMARY ARTERY AND LEFT RADIAL ARTERY (N/A) ECHOCARDIOGRAM, TRANSESOPHAGEAL, INTRAOPERATIVE (N/A) SURGICAL PROCUREMENT, ARTERY, RADIAL (Left)  CV: Hx of NSTEMI, on Plavix . SBP 97-113 on Norvasc  for radial artery conduit and Lopressor  12.5mg  BID. NSR, HR 80-90s.     Pulm: Saturating well on RA this AM. CXR yesterday with bibasilar atelectasis and likely trace bilateral pleural effusions. Encourage IS and ambulation.    GI: +BM yesterday, tolerating a diet.    Endo: Preop A1C 7.6. CBGs 219/186/101. On Semglee  to 14U BID. Mild hyponatremia likely due to hypervolemia, Na 133 improved yesterday after diuresis. Patient's wife asking about other diabetes medications options, patient to continue Trulicity  and Metformin  for now and follow up with PCP for diabetes medication management. May benefit from SGLT2 at some point but will defer to PCP.    Renal: Last Cr 0.98, no UO recorded. +1lb from preop. Received Lasix  40mg  yesterday. Will continue Lasix  40mg  x 3days outpatient for pleural effusions. Discussed to hold medication if he becomes dizzy or hypotensive.   ID: No leukocytosis, Tmax 99 for 2 occasions, otherwise 98. No sign of infection.  Expected postop ABLA: Last H/H 8.5/25.7, overall stable. Not clinically significant at this time. Reactive thrombocytopenia, plt 116,000. Stable. No signs of bleeding.    Dispo: Plan to d/c home this AM.    LOS: 9 days    Con GORMAN Bend, PA-C 11/13/2023

## 2023-11-13 NOTE — Progress Notes (Signed)
 AVS completed for discharge; RN completing discharge.

## 2023-11-15 ENCOUNTER — Telehealth (HOSPITAL_COMMUNITY): Payer: Self-pay

## 2023-11-17 ENCOUNTER — Ambulatory Visit: Admitting: Family Medicine

## 2023-11-17 ENCOUNTER — Other Ambulatory Visit (HOSPITAL_COMMUNITY): Payer: Self-pay

## 2023-11-17 ENCOUNTER — Encounter: Payer: Self-pay | Admitting: Family Medicine

## 2023-11-17 VITALS — BP 98/52 | HR 76 | Temp 97.9°F | Ht 70.0 in | Wt 168.5 lb

## 2023-11-17 DIAGNOSIS — E1165 Type 2 diabetes mellitus with hyperglycemia: Secondary | ICD-10-CM

## 2023-11-17 DIAGNOSIS — Z951 Presence of aortocoronary bypass graft: Secondary | ICD-10-CM

## 2023-11-17 DIAGNOSIS — I214 Non-ST elevation (NSTEMI) myocardial infarction: Secondary | ICD-10-CM

## 2023-11-17 DIAGNOSIS — Z7984 Long term (current) use of oral hypoglycemic drugs: Secondary | ICD-10-CM

## 2023-11-17 MED ORDER — EMPAGLIFLOZIN 10 MG PO TABS
10.0000 mg | ORAL_TABLET | Freq: Every day | ORAL | 1 refills | Status: DC
Start: 2023-11-17 — End: 2024-01-06
  Filled 2023-11-17: qty 30, 30d supply, fill #0
  Filled 2023-12-15: qty 30, 30d supply, fill #1

## 2023-11-17 NOTE — Progress Notes (Signed)
   Subjective:    Patient ID: Marcus Hart, male    DOB: August 07, 1965, 58 y.o.   MRN: [id number]  HPI Hospital f/u- pt was admitted 6/18-28 w/ NSTEMI.  He presented to [facility] ED w/ complaints of intermittent CP radiating to neck and arm.  EKG showed T wave inversions in II, III, aVF.  Initial troponin 383.  Increased to max of 412.  Was started on Heparin  and ASA.  ECHO done 6/19 showed EF 55-60% w/o regional wall abnormalities or valvular abnormalities.  Cath showed ostial LAD lesion 70% stenosis, proximal to mid LAD 50% stenosis, mid to distal circumflex w/ 99% stenosis, and RV branch 90% stenosis.  CT surgery consulted who recommended CABGx2.  He underwent surgery on 6/23.  He was started on Plavix , ASA decreased to 81mg  daily, on Metoprolol  12.5mg  BID, Crestor  40mg  daily.  For diabetes is on Trulicity  3mg  weekly, Metformin  1000mg  BID.  Last A1C 7.6 on 6/19.  Pt reports feeling good today.  Has not had any CP since d/c.  Some SOB w/ exertion.  Pt was referred to Cardiac Rehab- has not started yet.  Pt and wife reports BP has been running low but stable- no dizziness.  Pt and wife report that incisions are healing- no drainage or concern for infxn.  Has Cardiology f/u on 7/11 and surgery f/u on 8/5.  Dr Kerrin recommended starting SGLT2 after hospitalization   Review of Systems For ROS see HPI     Objective:   Physical Exam Vitals reviewed.  Constitutional:      General: He is not in acute distress.    Appearance: Normal appearance. He is well-developed. He is not ill-appearing.  HENT:     Head: Normocephalic and atraumatic.  Eyes:     Extraocular Movements: Extraocular movements intact.     Conjunctiva/sclera: Conjunctivae normal.     Pupils: Pupils are equal, round, and reactive to light.  Neck:     Thyroid : No thyromegaly.  Cardiovascular:     Rate and Rhythm: Normal rate and regular rhythm.     Pulses: Normal pulses.     Heart sounds: Normal heart sounds. No murmur  heard. Pulmonary:     Effort: Pulmonary effort is normal. No respiratory distress.     Breath sounds: Normal breath sounds.  Chest:     Chest wall: Tenderness (mild TTP over sternal incision- no evidence of infxn) present.  Abdominal:     General: Bowel sounds are normal. There is no distension.     Palpations: Abdomen is soft.  Musculoskeletal:     Cervical back: Normal range of motion and neck supple.     Right lower leg: No edema.     Left lower leg: No edema.  Lymphadenopathy:     Cervical: No cervical adenopathy.  Skin:    General: Skin is warm and dry.  Neurological:     General: No focal deficit present.     Mental Status: He is alert and oriented to person, place, and time.     Cranial Nerves: No cranial nerve deficit.  Psychiatric:        Mood and Affect: Mood normal.        Behavior: Behavior normal.           Assessment & Plan:

## 2023-11-17 NOTE — Patient Instructions (Signed)
 Follow up w/ Dr Levora as scheduled Keep your Cardiology and surgery follow ups as scheduled ADD the Jardiance once daily No other med changes at this time Someone from Cardiac Rehab should be calling.  If you don't hear from them by early next week, please let us  know Call with any questions or concerns Stay Safe!  Stay Healthy! Hang in there!!!

## 2023-11-18 MED FILL — Sodium Chloride IV Soln 0.9%: INTRAVENOUS | Qty: 2000 | Status: AC

## 2023-11-18 MED FILL — Electrolyte-R (PH 7.4) Solution: INTRAVENOUS | Qty: 4000 | Status: AC

## 2023-11-18 MED FILL — Mannitol IV Soln 20%: INTRAVENOUS | Qty: 500 | Status: AC

## 2023-11-18 MED FILL — Heparin Sodium (Porcine) Inj 1000 Unit/ML: INTRAMUSCULAR | Qty: 10 | Status: AC

## 2023-11-18 MED FILL — Sodium Bicarbonate IV Soln 8.4%: INTRAVENOUS | Qty: 50 | Status: AC

## 2023-11-18 MED FILL — Lidocaine HCl Local Soln Prefilled Syringe 100 MG/5ML (2%): INTRAMUSCULAR | Qty: 5 | Status: AC

## 2023-11-18 MED FILL — Heparin Sodium (Porcine) Inj 1000 Unit/ML: INTRAMUSCULAR | Qty: 30 | Status: AC

## 2023-11-22 ENCOUNTER — Other Ambulatory Visit: Payer: Self-pay

## 2023-11-25 ENCOUNTER — Telehealth (HOSPITAL_COMMUNITY): Payer: Self-pay

## 2023-11-25 NOTE — Telephone Encounter (Signed)
 Attempted to call patient regarding interest in cardiac rehab- no answer, left message to call us  back. Sent MyChart message.

## 2023-11-26 ENCOUNTER — Encounter: Payer: Self-pay | Admitting: Cardiology

## 2023-11-26 ENCOUNTER — Other Ambulatory Visit: Payer: Self-pay | Admitting: *Deleted

## 2023-11-26 ENCOUNTER — Ambulatory Visit: Attending: Cardiology | Admitting: Cardiology

## 2023-11-26 ENCOUNTER — Ambulatory Visit (HOSPITAL_COMMUNITY)
Admission: RE | Admit: 2023-11-26 | Discharge: 2023-11-26 | Disposition: A | Discharge status: Home / Self Care | Source: Ambulatory Visit | Attending: Cardiology

## 2023-11-26 VITALS — BP 98/60 | HR 76 | Ht 70.0 in | Wt 165.4 lb

## 2023-11-26 DIAGNOSIS — E7849 Other hyperlipidemia: Secondary | ICD-10-CM | POA: Insufficient documentation

## 2023-11-26 DIAGNOSIS — I214 Non-ST elevation (NSTEMI) myocardial infarction: Secondary | ICD-10-CM | POA: Insufficient documentation

## 2023-11-26 DIAGNOSIS — E7841 Elevated Lipoprotein(a): Secondary | ICD-10-CM | POA: Diagnosis not present

## 2023-11-26 DIAGNOSIS — E1165 Type 2 diabetes mellitus with hyperglycemia: Secondary | ICD-10-CM

## 2023-11-26 DIAGNOSIS — Z951 Presence of aortocoronary bypass graft: Secondary | ICD-10-CM | POA: Diagnosis present

## 2023-11-26 DIAGNOSIS — J90 Pleural effusion, not elsewhere classified: Secondary | ICD-10-CM

## 2023-11-26 NOTE — Patient Instructions (Signed)
 Medication Instructions:  Your physician has recommended you make the following change in your medication:   STOP Metoprolol   *If you need a refill on your cardiac medications before your next appointment, please call your pharmacy*  Lab Work: 1 MONTH GO TO A [facility] NEAR YOU, MAKE SURE YOU ARE FASTING, FOR:  LIPID & LFT  If you have labs (blood work) drawn today and your tests are completely normal, you will receive your results only by: MyChart Message (if you have MyChart) OR A paper copy in the mail If you have any lab test that is abnormal or we need to change your treatment, we will call you to review the results.  Testing/Procedures: A chest x-ray takes a picture of the organs and structures inside the chest, including the heart, lungs, and blood vessels. This test can show several things, including, whether the heart is enlarges; whether fluid is building up in the lungs; and whether pacemaker / defibrillator leads are still in place.  You have been referred to [facility]  Follow-Up: At [facility], you and your health needs are our priority.  As part of our continuing mission to provide you with exceptional heart care, our providers are all part of one team.  This team includes your primary Cardiologist (physician) and Advanced Practice Providers or APPs (Physician Assistants and Nurse Practitioners) who all work together to provide you with the care you need, when you need it.  Your next appointment:   3 month(s)  Provider:   Ozell Fell, MD    We recommend signing up for the patient portal called MyChart.  Sign up information is provided on this After Visit Summary.  MyChart is used to connect with patients for Virtual Visits (Telemedicine).  Patients are able to view lab/test results, encounter notes, upcoming appointments, etc.  Non-urgent messages can be sent to your provider as well.   To learn more about what you can do with MyChart, go to  [email or web address].   Other Instructions

## 2023-11-26 NOTE — Progress Notes (Signed)
 Cardiology Office Note:   Date:  11/26/2023  ID:  Marcus Hart, DOB [birth year], MRN [id number] PCP: Levora Reyes SAUNDERS, MD  [facility] Providers Cardiologist:  Ozell Fell, MD { Click to update primary MD,subspecialty MD or APP then REFRESH:1}   History of Present Illness:   Marcus Hart is a 58 y.o. male ***  Discussed the use of AI scribe software for clinical note transcription with the patient, who gave verbal consent to proceed.  History of Present Illness      Today patient denies chest pain, shortness of breath, lower extremity edema, fatigue, palpitations, melena, hematuria, hemoptysis, diaphoresis, weakness, presyncope, syncope, orthopnea, and PND.   Studies Reviewed:    EKG:   EKG Interpretation Date/Time:  Friday November 26 2023 11:12:36 EDT Ventricular Rate:  72 PR Interval:  152 QRS Duration:  84 QT Interval:  362 QTC Calculation: 396 R Axis:   25  Text Interpretation: Normal sinus rhythm Nonspecific T wave abnormality When compared with ECG of 09-Nov-2023 06:51, Premature atrial complexes are no longer Present ST no longer elevated in Anterior leads Nonspecific T wave abnormality now evident in Anterior leads Confirmed by Trudy Birmingham [phone]) on 11/26/2023 11:13:44 AM    11/04/23 TTE  IMPRESSIONS     1. Left ventricular ejection fraction, by estimation, is 55 to 60%. The  left ventricle has normal function. The left ventricle has no regional  wall motion abnormalities. Left ventricular diastolic parameters were  normal.   2. Right ventricular systolic function is normal. The right ventricular  size is normal.   3. The mitral valve is normal in structure. No evidence of mitral valve  regurgitation. No evidence of mitral stenosis.   4. The aortic valve is normal in structure. There is mild calcification  of the aortic valve. Aortic valve regurgitation is not visualized. No  aortic stenosis is present.   5. The inferior vena cava is normal in  size with greater than 50%  respiratory variability, suggesting right atrial pressure of 3 mmHg.   FINDINGS   Left Ventricle: Left ventricular ejection fraction, by estimation, is 55  to 60%. The left ventricle has normal function. The left ventricle has no  regional wall motion abnormalities. The left ventricular internal cavity  size was normal in size. There is   no left ventricular hypertrophy. Left ventricular diastolic parameters  were normal.   Right Ventricle: The right ventricular size is normal. No increase in  right ventricular wall thickness. Right ventricular systolic function is  normal.   Left Atrium: Left atrial size was normal in size.   Right Atrium: Right atrial size was normal in size.   Pericardium: There is no evidence of pericardial effusion.   Mitral Valve: The mitral valve is normal in structure. No evidence of  mitral valve regurgitation. No evidence of mitral valve stenosis.   Tricuspid Valve: The tricuspid valve is normal in structure. Tricuspid  valve regurgitation is not demonstrated. No evidence of tricuspid  stenosis.   Aortic Valve: The aortic valve is normal in structure. There is mild  calcification of the aortic valve. Aortic valve regurgitation is not  visualized. No aortic stenosis is present.   Pulmonic Valve: The pulmonic valve was normal in structure. Pulmonic valve  regurgitation is not visualized. No evidence of pulmonic stenosis.   Aorta: The aortic root is normal in size and structure.   Venous: The inferior vena cava is normal in size with greater than 50%  respiratory variability, suggesting right atrial  pressure of 3 mmHg.   IAS/Shunts: No atrial level shunt detected by color flow Doppler. Agitated  saline contrast was given intravenously to evaluate for intracardiac  shunting.    Coronary angiography 11/04/2023: LM: Essentially non existent with separate dual ostia to LAD and LCx LAD: Medium caliber vessel          Severe  dampening with 6 [place] EBU 3.0 guide engagement          Ostial 70% stenosis, CSA 5.1 mm2          Another lesion under appreciated angiographically, but noted on IVUS about 26 mm from the ostium with severe 180 deg calcification and CSA 3.2 mm2          RFR in mid LAD 0.83. Significant gradient was in prox-mid LAD, not in ostial LAD LCx: Large dominant vessel         Mid to distal 99% stenosis (likely culprit) with TIMI I flow distally RCA: Nondominant           RV marginal ostial 90% stenosis   LVEDP 14 mmHg      Challenging decision making re: revascularization given concern for severe LAD disease, in addition to likely culprit lesion in large dominant LCx, in a 58 y/o diabetic patient with NSTEMI and normal LVEF. He is currently chest pain free. Recommend Aspirin , IV heparin , IV nitroglycerin . In case there is any acute chest pain overnight with EKG changes concerning for posterior injury, we could consider urgent revascularization to Lcx. If patient stays stable, I will discuss the case in multidisciplinary heart team meeting tomorrow morning if he could benefit from CABG better.   Risk Assessment/Calculations:              Physical Exam:   VS:  BP 98/60   Pulse 76   Ht 5' 10 (1.778 m)   Wt 165 lb 6.4 oz (75 kg)   SpO2 98%   BMI 23.73 kg/m    Wt Readings from Last 3 Encounters:  11/26/23 165 lb 6.4 oz (75 kg)  11/17/23 168 lb 8 oz (76.4 kg)  11/13/23 172 lb 4.8 oz (78.2 kg)     Physical Exam  Physical Exam    ASSESSMENT AND PLAN:     Assessment and Plan Assessment & Plan       {The patient has an active order for outpatient cardiac rehabilitation.   Please indicate if the patient is ready to start. Do NOT delete this.  It will auto delete.  Refresh note, then sign.              Click here to document readiness and see contraindications.  :1}  Cardiac Rehabilitation Eligibility Assessment      {Are you ordering a CV Procedure (e.g. stress test,  cath, DCCV, TEE, etc)?   Press F2        :[id number]}   Signed, Artist Pouch, PA-C

## 2023-11-29 ENCOUNTER — Other Ambulatory Visit: Payer: Self-pay

## 2023-11-29 ENCOUNTER — Ambulatory Visit: Payer: Self-pay | Admitting: Cardiology

## 2023-11-29 DIAGNOSIS — J90 Pleural effusion, not elsewhere classified: Secondary | ICD-10-CM

## 2023-11-30 ENCOUNTER — Other Ambulatory Visit: Payer: Self-pay

## 2023-11-30 ENCOUNTER — Other Ambulatory Visit (HOSPITAL_COMMUNITY): Payer: Self-pay

## 2023-11-30 DIAGNOSIS — E7841 Elevated Lipoprotein(a): Secondary | ICD-10-CM | POA: Insufficient documentation

## 2023-11-30 DIAGNOSIS — J90 Pleural effusion, not elsewhere classified: Secondary | ICD-10-CM | POA: Insufficient documentation

## 2023-11-30 MED ORDER — POTASSIUM CHLORIDE ER 10 MEQ PO TBCR
10.0000 meq | EXTENDED_RELEASE_TABLET | Freq: Every day | ORAL | 0 refills | Status: DC
  Filled 2023-11-30: qty 30, 30d supply, fill #0

## 2023-11-30 MED ORDER — FUROSEMIDE 40 MG PO TABS
40.0000 mg | ORAL_TABLET | Freq: Every day | ORAL | 0 refills | Status: DC
  Filled 2023-11-30: qty 30, 30d supply, fill #0

## 2023-11-30 NOTE — Assessment & Plan Note (Signed)
 Last A1C 7.6% on 6/19.  In May, Trulicity  was increased to 3mg  weekly.  He is also on Metformin  1000mg  BID.  Thankfully since there is no evidence of heart failure post-op, no need to change metformin  at this time.  In order to get better A1C control and cardiac protection, will add Jardiance  10mg  daily.  Pt expressed understanding and is in agreement w/ plan.

## 2023-11-30 NOTE — Telephone Encounter (Signed)
 Pt and spouse returned my call this morning and have been made aware of Chest X-Ray results.

## 2023-11-30 NOTE — Assessment & Plan Note (Signed)
 New.  Incision is healing well.  Minimal pain.  Has surgical f/u pending.

## 2023-11-30 NOTE — Assessment & Plan Note (Signed)
 New.  Currently on ASA, Plavix , Metoprolol , Crestor , Trulicity , Metformin .  Will add Jardiance .  Was already referred to cardiac rehab.  Has cardiology f/u pending.

## 2023-12-01 DIAGNOSIS — Z006 Encounter for examination for normal comparison and control in clinical research program: Secondary | ICD-10-CM

## 2023-12-06 NOTE — Addendum Note (Signed)
 Addended by: MEMORY DELON POUR on: 12/06/2023 01:19 PM   Modules accepted: Orders

## 2023-12-09 ENCOUNTER — Other Ambulatory Visit: Payer: Self-pay | Admitting: Family Medicine

## 2023-12-09 ENCOUNTER — Other Ambulatory Visit: Payer: Self-pay

## 2023-12-09 ENCOUNTER — Other Ambulatory Visit: Payer: Self-pay | Admitting: Cardiology

## 2023-12-09 DIAGNOSIS — E785 Hyperlipidemia, unspecified: Secondary | ICD-10-CM

## 2023-12-09 DIAGNOSIS — E1165 Type 2 diabetes mellitus with hyperglycemia: Secondary | ICD-10-CM

## 2023-12-10 ENCOUNTER — Other Ambulatory Visit: Payer: Self-pay

## 2023-12-10 MED ORDER — ROSUVASTATIN CALCIUM 40 MG PO TABS
40.0000 mg | ORAL_TABLET | Freq: Every day | ORAL | 3 refills | Status: DC
  Filled 2023-12-10: qty 90, 90d supply, fill #0

## 2023-12-10 MED ORDER — ONETOUCH VERIO VI STRP
1.0000 | ORAL_STRIP | 0 refills | Status: DC
  Filled 2023-12-10: qty 100, 50d supply, fill #0

## 2023-12-10 MED ORDER — CLOPIDOGREL BISULFATE 75 MG PO TABS
75.0000 mg | ORAL_TABLET | Freq: Every day | ORAL | 3 refills | Status: AC
  Filled 2023-12-10: qty 90, 90d supply, fill #0
  Filled 2024-03-24: qty 90, 90d supply, fill #1
  Filled 2024-06-23: qty 90, 90d supply, fill #2

## 2023-12-10 NOTE — Telephone Encounter (Signed)
 Trudy Birmingham, PA-C to [place], April M, [place]  [facility] Triage  (Selected Message)     12/10/23  8:51 AM Metoprolol  was discontinued at last OV and Amlodipine  is managed by his CT surgery team. Please only provide refills for Plavix  and Crestor .   Birmingham Trudy, PA-C    Refills sent per Birmingham, PA-C.

## 2023-12-15 ENCOUNTER — Other Ambulatory Visit: Payer: Self-pay | Admitting: Family Medicine

## 2023-12-15 DIAGNOSIS — E1165 Type 2 diabetes mellitus with hyperglycemia: Secondary | ICD-10-CM

## 2023-12-16 ENCOUNTER — Telehealth: Payer: Self-pay

## 2023-12-16 ENCOUNTER — Other Ambulatory Visit: Payer: Self-pay

## 2023-12-16 MED ORDER — METFORMIN HCL 1000 MG PO TABS
1000.0000 mg | ORAL_TABLET | Freq: Two times a day (BID) | ORAL | 1 refills | Status: DC
  Filled 2023-12-16: qty 180, 90d supply, fill #0
  Filled 2024-03-24: qty 180, 90d supply, fill #1

## 2023-12-16 NOTE — Telephone Encounter (Signed)
 STD forms completed and faxed to [facility] @ [phone]./ [phone]. Beginning LOA 11/03/23 through 02/06/22. DOS 11/08/23.

## 2023-12-17 ENCOUNTER — Encounter (HOSPITAL_COMMUNITY): Payer: Self-pay

## 2023-12-17 ENCOUNTER — Telehealth (HOSPITAL_COMMUNITY): Payer: Self-pay | Admitting: *Deleted

## 2023-12-17 ENCOUNTER — Ambulatory Visit (HOSPITAL_COMMUNITY)
Admission: RE | Admit: 2023-12-17 | Discharge: 2023-12-17 | Disposition: A | Discharge status: Home / Self Care | Source: Ambulatory Visit | Attending: Emergency Medicine | Admitting: Emergency Medicine

## 2023-12-17 ENCOUNTER — Telehealth: Payer: Self-pay

## 2023-12-17 ENCOUNTER — Other Ambulatory Visit: Payer: Self-pay | Admitting: Thoracic Surgery (Cardiothoracic Vascular Surgery)

## 2023-12-17 VITALS — BP 114/75 | HR 82 | Temp 98.0°F | Resp 18

## 2023-12-17 DIAGNOSIS — Z951 Presence of aortocoronary bypass graft: Secondary | ICD-10-CM

## 2023-12-17 DIAGNOSIS — M79675 Pain in left toe(s): Secondary | ICD-10-CM

## 2023-12-17 DIAGNOSIS — M109 Gout, unspecified: Secondary | ICD-10-CM

## 2023-12-17 HISTORY — DX: ST elevation (STEMI) myocardial infarction of unspecified site: I21.3

## 2023-12-17 MED ORDER — PREDNISONE 20 MG PO TABS: 40.0000 mg | ORAL_TABLET | Freq: Every day | ORAL | 0 refills | Status: AC

## 2023-12-17 NOTE — ED Triage Notes (Signed)
 Pt and wife states he has left great toe swelling and pain X 2 days. He has not taken any meds.

## 2023-12-17 NOTE — Telephone Encounter (Signed)
 Called patient after receiving mychart message about left big toe pain and swelling for 2 days.  Discussed and patient is agreeable to be seen at urgent care for evaluation of toe. Will we see patient on 12/20/2023 for surgery follow up.

## 2023-12-17 NOTE — Discharge Instructions (Signed)
 Start taking 2 tablets of prednisone once daily for 5 days. Be sure that you are drinking lots of water and decreasing your carbohydrate intake over the next few days to avoid spikes in your blood sugar.  Monitor your blood sugars closely while taking this. Apply ice to the area periodically throughout the day and rest and elevate your foot throughout the day as well. Follow-up with your primary care provider for further evaluation and management of this. Return here as needed.

## 2023-12-17 NOTE — ED Provider Notes (Signed)
 [facility]    CSN: [id number] Arrival date & time: 12/17/23  1805      History   Chief Complaint Chief Complaint  Patient presents with   Toe Pain    Swollen painful big two. - Entered by patient    HPI Marcus Hart is a 58 y.o. male.   Patient presents with left great toe pain and swelling x 2 days.  Patient denies any known injury to the toe.  Patient denies history of gout.  Patient states that he did take 1 dose of Tylenol  without relief, and reports that he has not taken anything else for his pain.  Past medical history includes type 2 diabetes, hyperlipidemia, STEMI, and CABG x 2.  The history is provided by the patient and medical records.  Toe Pain    Past Medical History:  Diagnosis Date   Diabetes mellitus (HCC)    Diabetes mellitus without complication (HCC)    Phreesia 11/10/2019   Hyperlipidemia    STEMI (ST elevation myocardial infarction) [facility])     Patient Active Problem List   Diagnosis Date Noted   Elevated lipoprotein A level 11/30/2023   Pleural effusion 11/30/2023   S/P CABG x 2 11/08/2023   NSTEMI (non-ST elevated myocardial infarction) (HCC) 11/03/2023   Type 2 diabetes mellitus with hyperglycemia, without long-term current use of insulin  (HCC) 02/13/2013   Hyperlipidemia 02/13/2013    Past Surgical History:  Procedure Laterality Date   COLONOSCOPY     CORONARY ARTERY BYPASS GRAFT N/A 11/08/2023   Procedure: CORONARY ARTERY BYPASS GRAFTING X TWO USING LEFT INTERNAL MAMMARY ARTERY AND LEFT RADIAL ARTERY;  Surgeon: Kerrin Elspeth BROCKS, MD;  Location: [facility] OR;  Service: Open Heart Surgery;  Laterality: N/A;   CORONARY PRESSURE/FFR STUDY N/A 11/04/2023   Procedure: CORONARY PRESSURE/FFR STUDY;  Surgeon: Elmira Newman PARAS, MD;  Location: [facility];  Service: Cardiovascular;  Laterality: N/A;   CORONARY ULTRASOUND/IVUS N/A 11/04/2023   Procedure: Coronary Ultrasound/IVUS;  Surgeon: Elmira Newman PARAS, MD;  Location: [facility];  Service: Cardiovascular;  Laterality: N/A;   INTRAOPERATIVE TRANSESOPHAGEAL ECHOCARDIOGRAM N/A 11/08/2023   Procedure: ECHOCARDIOGRAM, TRANSESOPHAGEAL, INTRAOPERATIVE;  Surgeon: Kerrin Elspeth BROCKS, MD;  Location: [facility] OR;  Service: Open Heart Surgery;  Laterality: N/A;   LEFT HEART CATH AND CORONARY ANGIOGRAPHY N/A 11/04/2023   Procedure: LEFT HEART CATH AND CORONARY ANGIOGRAPHY;  Surgeon: Elmira Newman PARAS, MD;  Location: [facility];  Service: Cardiovascular;  Laterality: N/A;   NO PAST SURGERIES     RADIAL ARTERY HARVEST Left 11/08/2023   Procedure: SURGICAL PROCUREMENT, ARTERY, RADIAL;  Surgeon: Kerrin Elspeth BROCKS, MD;  Location: [facility] OR;  Service: Open Heart Surgery;  Laterality: Left;   UPPER GASTROINTESTINAL ENDOSCOPY         Home Medications    Prior to Admission medications   Medication Sig Start Date End Date Taking? Authorizing Provider  amLODipine  (NORVASC ) 5 MG tablet Take 1 tablet (5 mg total) by mouth daily. 11/13/23  Yes Raguel Con GORMAN DEVONNA  aspirin  EC 81 MG tablet Take 81 mg by mouth daily. Swallow whole.   Yes [provider]  clopidogrel  (PLAVIX ) 75 MG tablet Take 1 tablet (75 mg total) by mouth daily. 12/10/23  Yes Trudy Birmingham, PA-C  Dulaglutide  (TRULICITY ) 3 MG/0.5ML SOAJ Inject 3 mg into the skin once a week. 10/08/23  Yes Levora Reyes SAUNDERS, MD  empagliflozin  (JARDIANCE ) 10 MG TABS tablet Take 1 tablet (10 mg total) by mouth daily.  11/17/23  Yes Tabori, Katherine E, MD  metFORMIN  (GLUCOPHAGE ) 1000 MG tablet Take 1 tablet (1,000 mg total) by mouth 2 (two) times daily with a meal. 12/16/23  Yes Levora Reyes SAUNDERS, MD  predniSONE (DELTASONE) 20 MG tablet Take 2 tablets (40 mg total) by mouth daily for 5 days. 12/17/23 12/22/23 Yes Jakai Risse A, NP  rosuvastatin  (CRESTOR ) 40 MG tablet Take 1 tablet (40 mg total) by mouth daily. 12/10/23  Yes Williams, Evan, PA-C  acetaminophen  (TYLENOL ) 325 MG tablet Take 2 tablets (650 mg total) by  mouth every 6 (six) hours as needed for mild pain (pain score 1-3). 11/13/23   Raguel Con RAMAN, PA-C  furosemide  (LASIX ) 40 MG tablet Take 1 tablet (40 mg total) by mouth daily for 3 days then take as directed by physician. 11/29/23   Trudy Birmingham, PA-C  glucose blood (ONETOUCH VERIO) test strip Use as directed. 12/10/23   Levora Reyes SAUNDERS, MD  Insulin  Pen Needle (PEN NEEDLES) 32G X 4 MM MISC Use with Victoza  to inject once daily 06/30/21   Levora Reyes SAUNDERS, MD  Lancets [facility] DELICA PLUS [place]) MISC USE AS DIRECTED TO TEST BLOOD GLUCOSE 11/03/21   Levora Reyes SAUNDERS, MD  oxyCODONE  (OXY IR/ROXICODONE ) 5 MG immediate release tablet Take 1 tablet (5 mg total) by mouth every 6 (six) hours as needed for severe pain (pain score 7-10). 11/13/23   Raguel Con RAMAN, PA-C  potassium chloride  (KLOR-CON ) 10 MEQ tablet Take 1 tablet (10 mEq total) by mouth daily for 3 days then take as directed by physician. 11/29/23   Trudy Birmingham, PA-C    Family History Family History  Problem Relation Age of Onset   Diabetes Mother    Bladder Cancer Mother    Hypertension Mother    Ulcers Father    Benign prostatic hyperplasia Father    Colon cancer Neg Hx    Colon polyps Neg Hx    Esophageal cancer Neg Hx    Rectal cancer Neg Hx    Stomach cancer Neg Hx     Social History Social History   Tobacco Use   Smoking status: Never   Smokeless tobacco: Never  Vaping Use   Vaping status: Never Used  Substance Use Topics   Alcohol use: Never    Alcohol/week: 0.0 standard drinks of alcohol   Drug use: No     Allergies   Pork-derived products   Review of Systems Review of Systems  Per HPI  Physical Exam Triage Vital Signs ED Triage Vitals  Encounter Vitals Group     BP 12/17/23 1818 114/75     Girls Systolic BP Percentile --      Girls Diastolic BP Percentile --      Boys Systolic BP Percentile --      Boys Diastolic BP Percentile --      Pulse Rate 12/17/23 1818 82     Resp 12/17/23  1818 18     Temp 12/17/23 1818 98 F (36.7 C)     Temp Source 12/17/23 1818 Oral     SpO2 12/17/23 1818 99 %     Weight --      Height --      Head Circumference --      Peak Flow --      Pain Score 12/17/23 1816 8     Pain Loc --      Pain Education --      Exclude from Growth Chart --    No  data found.  Updated Vital Signs BP 114/75 (BP Location: Left Arm)   Pulse 82   Temp 98 F (36.7 C) (Oral)   Resp 18   SpO2 99%   Visual Acuity Right Eye Distance:   Left Eye Distance:   Bilateral Distance:    Right Eye Near:   Left Eye Near:    Bilateral Near:     Physical Exam Vitals and nursing note reviewed.  Constitutional:      General: He is awake. He is not in acute distress.    Appearance: Normal appearance. He is well-developed and well-groomed. He is not ill-appearing.  Musculoskeletal:     Left foot: Normal range of motion. Swelling, tenderness and bony tenderness present. No deformity.     Comments: Swelling, tenderness, erythema, and warmth noted over MTP joint of left great toe.  Skin:    General: Skin is warm and dry.  Neurological:     Mental Status: He is alert.  Psychiatric:        Behavior: Behavior is cooperative.      UC Treatments / Results  Labs (all labs ordered are listed, but only abnormal results are displayed) Labs Reviewed - No data to display  EKG   Radiology No results found.  Procedures Procedures (including critical care time)  Medications Ordered in UC Medications - No data to display  Initial Impression / Assessment and Plan / UC Course  I have reviewed the triage vital signs and the nursing notes.  Pertinent labs & imaging results that were available during my care of the patient were reviewed by me and considered in my medical decision making (see chart for details).     Patient is overall well-appearing.  Vitals are stable.  Exam findings consistent with gout.  Prescribed prednisone burst for this.  Discussed  follow-up and return precautions. Final Clinical Impressions(s) / UC Diagnoses   Final diagnoses:  Pain of left great toe  Acute gout involving toe of left foot, unspecified cause     Discharge Instructions      Start taking 2 tablets of prednisone once daily for 5 days. Be sure that you are drinking lots of water and decreasing your carbohydrate intake over the next few days to avoid spikes in your blood sugar.  Monitor your blood sugars closely while taking this. Apply ice to the area periodically throughout the day and rest and elevate your foot throughout the day as well. Follow-up with your primary care provider for further evaluation and management of this. Return here as needed.   ED Prescriptions     Medication Sig Dispense Auth. Provider   predniSONE (DELTASONE) 20 MG tablet Take 2 tablets (40 mg total) by mouth daily for 5 days. 10 tablet Johnie Flaming A, NP      PDMP not reviewed this encounter.   Johnie Flaming A, NP 12/17/23 1902

## 2023-12-17 NOTE — Telephone Encounter (Signed)
 Returned call to wife who left message regarding scheduling CR.  Advised that we  have been following him and we were waiting for the follow up appt with the surgeon along with chest xray for resolution of the moderate left pleural effusion.  Verbalized understanding. Saturnino Bernett PEAK, BSN Cardiac and Emergency planning/management officer

## 2023-12-20 ENCOUNTER — Other Ambulatory Visit: Payer: Self-pay

## 2023-12-20 ENCOUNTER — Ambulatory Visit (HOSPITAL_COMMUNITY)
Admission: RE | Admit: 2023-12-20 | Discharge: 2023-12-20 | Disposition: A | Discharge status: Home / Self Care | Source: Ambulatory Visit | Attending: Cardiology | Admitting: Cardiology

## 2023-12-20 ENCOUNTER — Ambulatory Visit: Payer: Self-pay

## 2023-12-20 VITALS — BP 114/69 | HR 84 | Resp 18 | Ht 70.0 in | Wt 165.0 lb

## 2023-12-20 DIAGNOSIS — Z006 Encounter for examination for normal comparison and control in clinical research program: Secondary | ICD-10-CM

## 2023-12-20 DIAGNOSIS — J90 Pleural effusion, not elsewhere classified: Secondary | ICD-10-CM

## 2023-12-20 DIAGNOSIS — Z951 Presence of aortocoronary bypass graft: Secondary | ICD-10-CM

## 2023-12-20 MED ORDER — AMLODIPINE BESYLATE 5 MG PO TABS
5.0000 mg | ORAL_TABLET | Freq: Every day | ORAL | 0 refills | Status: DC
  Filled 2023-12-20: qty 90, 90d supply, fill #0

## 2023-12-20 NOTE — Progress Notes (Signed)
 [address] [place] [address]             [phone]       HPI: [name] is a 58 year old male with medical history of type 2 diabetes, NSTEMI, and hyperlipidemia returns for routine postoperative follow-up having undergone CABG x 2 utilizing LIMA to LAD and radial artery to OM4 on 11/08/2023 with Dr. Kerrin. The patient's early postoperative recovery while in the hospital was unremarkable. He was started on amlodipine  5 mg for radial artery conduit.  He remained in normal sinus rhythm. He was started on plaxvix and aspirin  for NSTEMI.  He was discharged home in stable condition on 11/13/2023.   Since hospital discharge the patient reports that he has been doing well.  He was seen by cardiology on 11/26/2023 was found to have a pleural effusion.  He was treated with Lasix  for 3 days and had improvement of his shortness of breath.  He was recently seen by urgent care and was diagnosed with gout.  He is currently taking steroids for gout.  He reports that this is helping dramatically with his toe pain.  He has not been needing to take anything for incisional pain.  He does report some numbness along his left chest.  This is slowly been improving with time.  He denies chest pain, shortness of breath, lower leg swelling.   Current Outpatient Medications  Medication Sig Dispense Refill   acetaminophen  (TYLENOL ) 325 MG tablet Take 2 tablets (650 mg total) by mouth every 6 (six) hours as needed for mild pain (pain score 1-3).     amLODipine  (NORVASC ) 5 MG tablet Take 1 tablet (5 mg total) by mouth daily. 30 tablet 1   aspirin  EC 81 MG tablet Take 81 mg by mouth daily. Swallow whole.     clopidogrel  (PLAVIX ) 75 MG tablet Take 1 tablet (75 mg total) by mouth daily. 90 tablet 3   Dulaglutide  (TRULICITY ) 3 MG/0.5ML SOAJ Inject 3 mg into the skin once a week. 6 mL 1   empagliflozin  (JARDIANCE ) 10 MG TABS tablet Take 1 tablet (10 mg total) by mouth daily. 30  tablet 1   furosemide  (LASIX ) 40 MG tablet Take 1 tablet (40 mg total) by mouth daily for 3 days then take as directed by physician. 30 tablet 0   glucose blood (ONETOUCH VERIO) test strip Use as directed. 100 strip 0   Insulin  Pen Needle (PEN NEEDLES) 32G X 4 MM MISC Use with Victoza  to inject once daily 90 each 3   Lancets (ONETOUCH DELICA PLUS LANCET33G) MISC USE AS DIRECTED TO TEST BLOOD GLUCOSE 100 each 1   metFORMIN  (GLUCOPHAGE ) 1000 MG tablet Take 1 tablet (1,000 mg total) by mouth 2 (two) times daily with a meal. 180 tablet 1   oxyCODONE  (OXY IR/ROXICODONE ) 5 MG immediate release tablet Take 1 tablet (5 mg total) by mouth every 6 (six) hours as needed for severe pain (pain score 7-10). 28 tablet 0   potassium chloride  (KLOR-CON ) 10 MEQ tablet Take 1 tablet (10 mEq total) by mouth daily for 3 days then take as directed by physician. 30 tablet 0   predniSONE  (DELTASONE ) 20 MG tablet Take 2 tablets (40 mg total) by mouth daily for 5 days. 10 tablet 0   rosuvastatin  (CRESTOR ) 40 MG tablet Take 1 tablet (40 mg total) by mouth daily. 90 tablet 3   No current facility-administered medications for this visit.  Vitals:   12/20/23 1350  BP: 114/69  Pulse: 84  Resp: 18  SpO2: 97%   Review of Systems  Respiratory: Negative.  Negative for cough and shortness of breath.   Cardiovascular: Negative.  Negative for chest pain and leg swelling.    Physical Exam: Physical Exam Constitutional:      Appearance: Normal appearance.  HENT:     Head: Normocephalic and atraumatic.  Cardiovascular:     Rate and Rhythm: Normal rate and regular rhythm.     Heart sounds: Normal heart sounds, S1 normal and S2 normal.  Pulmonary:     Effort: Pulmonary effort is normal.     Breath sounds: Examination of the left-lower field reveals decreased breath sounds. Decreased breath sounds present.     Comments: Slightly decreased Skin:    General: Skin is warm and dry.  Neurological:     General: No focal  deficit present.     Mental Status: He is alert and oriented to person, place, and time.       Diagnostic Tests: CLINICAL DATA:  s/p cabg.   EXAM: CHEST - 2 VIEW   COMPARISON:  11/26/2023.   FINDINGS: There is residual subtle left pleural effusion, significantly decreased since the prior study. Bilateral lung fields are clear. Right costophrenic angle is clear. No pneumothorax.   Normal cardio-mediastinal silhouette.   There are surgical staples along the heart border and sternotomy wires, status post CABG (coronary artery bypass graft).   No acute osseous abnormalities.   The soft tissues are within normal limits.   IMPRESSION: Residual subtle left pleural effusion, significantly decreased since the prior study.     Electronically Signed   By: Ree Molt M.D.   On: 12/20/2023 13:53   Plan: 1.S/P CABG x 2 2.Pleural effusion -We reviewed today's chest x ray.  There is still a small left pleural effusion on chest x-ray.  He is currently on prednisone  for gout which should help improve pleural effusion.  Patient understands to contact clinic if he starts to experience shortness of breath after finishing steroids.  We discussed driving and he is able to since he no longer taking narcotics for pain.  First time driving should be a short distance in the daytime and he can slowly increase from there.  He is cleared to participate in cardiac rehab at this time.  They have reached out to him and he can call them back to schedule.  Sternal precautions discussed and he can slowly start to increase activity and exercise as tolerated.  Refill of amlodipine  was sent for 90 days since radial artery was used.  He has been seen by his cardiologist and they stopped his metoprolol  at this time due to low blood pressure.  He is to continue taking rosuvastatin  for hyperlipidemia and he was referred to lipid clinic for potentially starting Repatha.  He is continue to take aspirin  and  Plavix .  Continue follow-up with cardiology.  Follow up with [facility] as needed.     Manuelita CHRISTELLA Rough, PA-C [facility] [phone]

## 2023-12-20 NOTE — Research (Signed)
 [facility]     FOLLOW-UP PHONE CALLS  SUBJECT ID:  [id number] Trainer's name: Malekai Markwood Trainer's signature: on Delegation of Authority Log Date of the Follow up call: 20-Dec-2023  Visit Number: 3 Start time of the Follow up call:  0900          End time of call: 0910    Q1;  Was the follow up phone call done with the subject?  [x]   YES  []   NO           If NO, check the following:      Q2:    Was the follow up phone call done with a family member               Or caregiver?    []   YES   [x]   NO          Make note about reasons ___________________________________    AUTOINJECTOR LABEL:  Still legible?  [x]   Yes  []   No     Q3:  Did any of the following occur?   []   Death       []   Hospitalization (any cause)         []   Use of autoinjector  Make note about the type of event, and when it occurred. In case of hospitalization: the Location of the hospital and/or the treating physician's contact details  ____________ ____________________________________________________________________ ____________________________________________________________________    Q4:  Did the subject develop any condition which is an          exclusion criterion?  []   YES  [x]   NO   Take note about the occurrence of any exclusion criterion after randomization: _________________________________________________________________ __________________________________________________________________  Q5: Was there any change in subject's antithrombotic therapy?   []   YES  [x]   NO     Take note about any change of antithrombotic treatment: _______________________ ____________________________________________________________________ ____________________________________________________________________      SHARYLE OF THE STUDY-SPECFIC TRAINING  Q6: Did the subject correctly reply to the following questions?       A.  What are common heart attack symptoms?      [x]   YES   []    NO      B.  What has to be done in case any of those symptoms occurs? [x]   YES  []   NO      C.  What are the main steps to perform a self-injection?  [x]   YES  []   NO             If NO, then report which step/s was/were missing:        []   Choose injection site (abdomen or thigh)       []   Twist cap off       []   Pinch skin and place the study autoinjector       []   Firmly push down and hold for 3 seconds       D. What has to be done immediately after an injection?  [x]   YES   []   NO          If NO, then report which step/s was/were missing:       []   Call  for emergency medical help       []   Show the autoinjector to the emergency medical responder       E.  Does the subject recall where s/he keeps/ stores the autoinjectors? [x]  YES  []  NO  Note the place of storage and any corrective explanation if needed below __________________________________________________________________ __________________________________________________________________  TRAINING REFRESHER   Q7:  Is a training refresher needed?      []  YES  [x]  NO        If YES, indicate items that have to be refreshed. More than one may apply:               []   Heart attack symptoms             []   Actions to be taken following heart attack symptoms             []   Steps to perform the self-injection and follow-up actions to be taken   []   Other, Specify  _________________________________________      Current Outpatient Medications:    acetaminophen  (TYLENOL ) 325 MG tablet, Take 2 tablets (650 mg total) by mouth every 6 (six) hours as needed for mild pain (pain score 1-3)., Disp: , Rfl:    amLODipine  (NORVASC ) 5 MG tablet, Take 1 tablet (5 mg total) by mouth daily., Disp: 30 tablet, Rfl: 1   aspirin  EC 81 MG tablet, Take 81 mg by mouth daily. Swallow whole., Disp: , Rfl:    clopidogrel  (PLAVIX ) 75 MG tablet, Take 1 tablet (75 mg total) by mouth daily., Disp: 90 tablet, Rfl: 3   Dulaglutide  (TRULICITY ) 3 MG/0.5ML  SOAJ, Inject 3 mg into the skin once a week., Disp: 6 mL, Rfl: 1   empagliflozin  (JARDIANCE ) 10 MG TABS tablet, Take 1 tablet (10 mg total) by mouth daily., Disp: 30 tablet, Rfl: 1   furosemide  (LASIX ) 40 MG tablet, Take 1 tablet (40 mg total) by mouth daily for 3 days then take as directed by physician., Disp: 30 tablet, Rfl: 0   glucose blood (ONETOUCH VERIO) test strip, Use as directed., Disp: 100 strip, Rfl: 0   Insulin  Pen Needle (PEN NEEDLES) 32G X 4 MM MISC, Use with Victoza  to inject once daily, Disp: 90 each, Rfl: 3   Lancets (ONETOUCH DELICA PLUS LANCET33G) MISC, USE AS DIRECTED TO TEST BLOOD GLUCOSE, Disp: 100 each, Rfl: 1   metFORMIN  (GLUCOPHAGE ) 1000 MG tablet, Take 1 tablet (1,000 mg total) by mouth 2 (two) times daily with a meal., Disp: 180 tablet, Rfl: 1   oxyCODONE  (OXY IR/ROXICODONE ) 5 MG immediate release tablet, Take 1 tablet (5 mg total) by mouth every 6 (six) hours as needed for severe pain (pain score 7-10)., Disp: 28 tablet, Rfl: 0   potassium chloride  (KLOR-CON ) 10 MEQ tablet, Take 1 tablet (10 mEq total) by mouth daily for 3 days then take as directed by physician., Disp: 30 tablet, Rfl: 0   predniSONE  (DELTASONE ) 20 MG tablet, Take 2 tablets (40 mg total) by mouth daily for 5 days., Disp: 10 tablet, Rfl: 0   rosuvastatin  (CRESTOR ) 40 MG tablet, Take 1 tablet (40 mg total) by mouth daily., Disp: 90 tablet, Rfl: 3

## 2023-12-20 NOTE — Patient Instructions (Signed)
You may continue to gradually increase your physical activity as tolerated.  Refrain from any heavy lifting or strenuous use of your arms and shoulders until at least 8 weeks from the time of your surgery, and avoid activities that cause increased pain in your chest on the side of your surgical incision.  Otherwise you may continue to increase activities without any particular limitations.  Increase the intensity and duration of physical activity gradually.   You may return to driving an automobile as long as you are no longer requiring oral narcotic pain relievers during the daytime.  It would be wise to start driving only short distances during the daylight and gradually increase from there as you feel comfortable.  

## 2023-12-21 ENCOUNTER — Ambulatory Visit: Admitting: Thoracic Surgery (Cardiothoracic Vascular Surgery)

## 2023-12-21 LAB — LIPID PANEL
Chol/HDL Ratio: 2.7 ratio (ref 0.0–5.0)
Cholesterol, Total: 147 mg/dL (ref 100–199)
HDL: 55 mg/dL (ref >39)
LDL Chol Calc (NIH): 79 mg/dL (ref 0–99)
Triglycerides: 67 mg/dL (ref 0–149)
VLDL Cholesterol Cal: 13 mg/dL (ref 5–40)

## 2023-12-21 LAB — HEPATIC FUNCTION PANEL
ALT: 9 IU/L (ref 0–44)
AST: 12 IU/L (ref 0–40)
Albumin: 4.4 g/dL (ref 3.8–4.9)
Alkaline Phosphatase: 66 IU/L (ref 44–121)
Bilirubin Total: 0.3 mg/dL (ref 0.0–1.2)
Bilirubin, Direct: 0.14 mg/dL (ref 0.00–0.40)
Total Protein: 7.1 g/dL (ref 6.0–8.5)

## 2023-12-22 ENCOUNTER — Other Ambulatory Visit: Payer: Self-pay

## 2023-12-22 MED ORDER — EZETIMIBE 10 MG PO TABS
10.0000 mg | ORAL_TABLET | Freq: Every day | ORAL | 3 refills | Status: DC
  Filled 2023-12-22: qty 90, 90d supply, fill #0

## 2023-12-22 NOTE — Addendum Note (Signed)
 Addended by: MEMORY DELON POUR on: 12/22/2023 01:44 PM   Modules accepted: Orders

## 2023-12-23 ENCOUNTER — Other Ambulatory Visit (HOSPITAL_COMMUNITY): Payer: Self-pay | Admitting: Pharmacist

## 2023-12-23 MED ORDER — STUDY - SOS-AMI - SELATOGREL 16 MG/0.5 ML OR PLACEBO SQ INJECTION (PI-CHRISTOPHER): 16.0000 mg | INJECTION | Freq: Once | SUBCUTANEOUS | 0 refills | Status: AC

## 2023-12-23 NOTE — Research (Addendum)
 [facility]     FOLLOW-UP PHONE CALLS  SUBJECT ID:  [id number] Trainer's name: Lynel Forester Trainer's signature: on Delegation of Authority Log Date of the Follow up call:  01-Dec-2023  Visit Number: 2 Start time of the Follow up call:    1000             End time of call: 1010    Q1;  Was the follow up phone call done with the subject?  [x]   YES  []   NO           If NO, check the following:      Q2:    Was the follow up phone call done with a family member               Or caregiver?    []   YES   [x]   NO          Make note about reasons ___________________________________    AUTOINJECTOR LABEL:  Still legible?  [x]   Yes  []   No     Q3:  Did any of the following occur?   []   Death       []   Hospitalization (any cause)         []   Use of autoinjector  Make note about the type of event, and when it occurred. In case of hospitalization: the Location of the hospital and/or the treating physician's contact details  ____________ ____________________________________________________________________ ____________________________________________________________________    Q4:  Did the subject develop any condition which is an          exclusion criterion?  []   YES  [x]   NO   Take note about the occurrence of any exclusion criterion after randomization: _________________________________________________________________ __________________________________________________________________  Q5: Was there any change in subject's antithrombotic therapy?   []   YES  [x]   NO     Take note about any change of antithrombotic treatment: _______________________ ____________________________________________________________________ ____________________________________________________________________      SHARYLE OF THE STUDY-SPECFIC TRAINING  Q6: Did the subject correctly reply to the following questions?       A.  What are common heart attack symptoms?      [x]   YES    []   NO      B.  What has to be done in case any of those symptoms occurs? [x]   YES  []   NO      C.  What are the main steps to perform a self-injection?  [x]   YES  []   NO             If NO, then report which step/s was/were missing:        []   Choose injection site (abdomen or thigh)       []   Twist cap off       []   Pinch skin and place the study autoinjector       []   Firmly push down and hold for 3 seconds       D. What has to be done immediately after an injection?  [x]   YES   []   NO          If NO, then report which step/s was/were missing:       []   Call  for emergency medical help       []   Show the autoinjector to the emergency medical responder       E.  Does the subject recall where s/he keeps/ stores the autoinjectors? [  x] YES  []  NO          Note the place of storage and any corrective explanation if needed below __________________________________________________________________ __________________________________________________________________  TRAINING REFRESHER   Q7:  Is a training refresher needed?      []  YES  [x]  NO        If YES, indicate items that have to be refreshed. More than one may apply:               []   Heart attack symptoms             []   Actions to be taken following heart attack symptoms             []   Steps to perform the self-injection and follow-up actions to be taken   []   Other, Specify  _________________________________________

## 2023-12-24 ENCOUNTER — Telehealth: Payer: Self-pay | Admitting: Cardiovascular Disease

## 2023-12-24 ENCOUNTER — Telehealth (HOSPITAL_COMMUNITY): Payer: Self-pay

## 2023-12-24 NOTE — Telephone Encounter (Signed)
 Office called to say that a pior auth needs to come from our office for the patient. Please advise   The cpt code they use are: 06201; [id number]; [id number]; [phone]

## 2023-12-24 NOTE — Telephone Encounter (Signed)
 Called [employer] to verify insurance, they stated all CR codes need prior authorization. Also checked using their website, confirmed all codes need prior auth. Spoke with Joan on 12/24/2023 @ 10:00am, ref# [id number].  Called Dr. Margurite office, spoke with receptionist, informed them they need to send request for prior auth. Gave them CR CPT codes. They confirmed they will send the prior auth request.

## 2023-12-27 ENCOUNTER — Other Ambulatory Visit: Payer: Self-pay

## 2023-12-27 DIAGNOSIS — Z006 Encounter for examination for normal comparison and control in clinical research program: Secondary | ICD-10-CM

## 2023-12-27 NOTE — Research (Signed)
 [facility]  Left message on patients phone and on wife's phone for patient to call back for follow up for [facility] study.

## 2023-12-28 ENCOUNTER — Telehealth: Payer: Self-pay | Admitting: *Deleted

## 2023-12-28 ENCOUNTER — Encounter: Payer: Self-pay | Admitting: *Deleted

## 2023-12-28 DIAGNOSIS — Z006 Encounter for examination for normal comparison and control in clinical research program: Secondary | ICD-10-CM

## 2023-12-28 NOTE — Telephone Encounter (Signed)
 I called patient to let him know his nitroglycerin   prescription was called in by Dr. Wonda. He should pick up his medication at his pharmacy. Patient verbalized understanding.

## 2023-12-28 NOTE — Research (Addendum)
 [facility]     FOLLOW-UP PHONE CALLS  SUBJECT ID:  [id number] Trainer's name: Mercer Israel, RN Trainer's signature: on Delegation of Authority Log Date of the Follow up call:     12/Aug/2025 Visit Number: 003 Start time of the Follow up call: 9:30am                End time of call: 09:45am    Q1;  Was the follow up phone call done with the subject?  [x]   YES  []   NO           If NO, check the following:      Q2:    Was the follow up phone call done with a family member               Or caregiver?    []   YES   [x]   NO          Make note about reasons ___________________________________    AUTOINJECTOR LABEL:  Still legible?  [x]   Yes  []   No     Q3:  Did any of the following occur?   []   Death       []   Hospitalization (any cause)         []   Use of autoinjector  Make note about the type of event, and when it occurred. In case of hospitalization: the Location of the hospital and/or the treating physician's contact details  ____________ No events_________________________________________________________________ ____________________________________________________________________    Q4:  Did the subject develop any condition which is an          exclusion criterion?  []   YES  [x]   NO   Take note about the occurrence of any exclusion criterion after randomization: _________________________________________________________________ __________________________________________________________________  Q5: Was there any change in subject's antithrombotic therapy?   []   YES  [x]   NO     Take note about any change of antithrombotic treatment: _______________________ ____________________________________________________________________ ____________________________________________________________________      SHARYLE OF THE STUDY-SPECFIC TRAINING  Q6: Did the subject correctly reply to the following questions?       A.  What are common heart attack  symptoms?      [x]   YES   []   NO      B.  What has to be done in case any of those symptoms occurs? [x]   YES  []   NO      C.  What are the main steps to perform a self-injection?  [x]   YES  []   NO             If NO, then report which step/s was/were missing:        []   Choose injection site (abdomen or thigh)       []   Twist cap off       []   Pinch skin and place the study autoinjector       []   Firmly push down and hold for 3 seconds       D. What has to be done immediately after an injection?  [x]   YES   []   NO          If NO, then report which step/s was/were missing:       []   Call  for emergency medical help       []   Show the autoinjector to the emergency medical responder       E.  Does the subject recall where s/he keeps/  stores the autoinjectors? [x]  YES  []  NO          Note the place of storage and any corrective explanation if needed below __________________________________________________________________ __________________________________________________________________  TRAINING REFRESHER   Q7:  Is a training refresher needed?      []  YES  [x]  NO        If YES, indicate items that have to be refreshed. More than one may apply:               []   Heart attack symptoms             []   Actions to be taken following heart attack symptoms             []   Steps to perform the self-injection and follow-up actions to be taken   []   Other, Specify  _________________________________________        Current Outpatient Medications:    acetaminophen  (TYLENOL ) 325 MG tablet, Take 2 tablets (650 mg total) by mouth every 6 (six) hours as needed for mild pain (pain score 1-3)., Disp: , Rfl:    amLODipine  (NORVASC ) 5 MG tablet, Take 1 tablet (5 mg total) by mouth daily., Disp: 30 tablet, Rfl: 1   amLODipine  (NORVASC ) 5 MG tablet, Take 1 tablet (5 mg total) by mouth daily., Disp: 90 tablet, Rfl: 0   aspirin  EC 81 MG tablet, Take 81 mg by mouth daily. Swallow whole., Disp: , Rfl:     clopidogrel  (PLAVIX ) 75 MG tablet, Take 1 tablet (75 mg total) by mouth daily., Disp: 90 tablet, Rfl: 3   Dulaglutide  (TRULICITY ) 3 MG/0.5ML SOAJ, Inject 3 mg into the skin once a week., Disp: 6 mL, Rfl: 1   empagliflozin  (JARDIANCE ) 10 MG TABS tablet, Take 1 tablet (10 mg total) by mouth daily., Disp: 30 tablet, Rfl: 1   ezetimibe  (ZETIA ) 10 MG tablet, Take 1 tablet (10 mg total) by mouth daily., Disp: 90 tablet, Rfl: 3   furosemide  (LASIX ) 40 MG tablet, Take 1 tablet (40 mg total) by mouth daily for 3 days then take as directed by physician., Disp: 30 tablet, Rfl: 0   glucose blood (ONETOUCH VERIO) test strip, Use as directed., Disp: 100 strip, Rfl: 0   Insulin  Pen Needle (PEN NEEDLES) 32G X 4 MM MISC, Use with Victoza  to inject once daily, Disp: 90 each, Rfl: 3   Lancets (ONETOUCH DELICA PLUS LANCET33G) MISC, USE AS DIRECTED TO TEST BLOOD GLUCOSE, Disp: 100 each, Rfl: 1   metFORMIN  (GLUCOPHAGE ) 1000 MG tablet, Take 1 tablet (1,000 mg total) by mouth 2 (two) times daily with a meal., Disp: 180 tablet, Rfl: 1   oxyCODONE  (OXY IR/ROXICODONE ) 5 MG immediate release tablet, Take 1 tablet (5 mg total) by mouth every 6 (six) hours as needed for severe pain (pain score 7-10)., Disp: 28 tablet, Rfl: 0   potassium chloride  (KLOR-CON ) 10 MEQ tablet, Take 1 tablet (10 mEq total) by mouth daily for 3 days then take as directed by physician., Disp: 30 tablet, Rfl: 0   rosuvastatin  (CRESTOR ) 40 MG tablet, Take 1 tablet (40 mg total) by mouth daily., Disp: 90 tablet, Rfl: 3

## 2023-12-29 ENCOUNTER — Other Ambulatory Visit (HOSPITAL_COMMUNITY): Payer: Self-pay | Admitting: Pharmacist

## 2023-12-29 ENCOUNTER — Other Ambulatory Visit: Payer: Self-pay

## 2023-12-29 ENCOUNTER — Telehealth: Payer: Self-pay | Admitting: *Deleted

## 2023-12-29 MED ORDER — STUDY - SOS-AMI - SELATOGREL 16 MG/0.5 ML OR PLACEBO SQ INJECTION (PI-CHRISTOPHER): 16.0000 mg | INJECTION | SUBCUTANEOUS | 0 refills | Status: DC | PRN

## 2023-12-29 MED ORDER — NITROGLYCERIN 0.4 MG SL SUBL
0.4000 mg | SUBLINGUAL_TABLET | SUBLINGUAL | 3 refills | Status: AC | PRN
  Filled 2023-12-29: qty 25, 9d supply, fill #0

## 2023-12-29 NOTE — Telephone Encounter (Signed)
-----   Message from Ozell Fell sent at 12/28/2023  3:37 PM EDT ----- Please call in prn NTG for this patient. Thank you

## 2023-12-29 NOTE — Telephone Encounter (Signed)
 Called and spoke to pt. He has not picked up any RX for SL NTG at any pharmacy. RX sent in, per Dr. Wonda to pt's preferred pharm. Reviewed appropriate use of this medication with the patient. He verbalized understanding. No other concerns.

## 2023-12-30 ENCOUNTER — Emergency Department (HOSPITAL_COMMUNITY)
Admission: EM | Admit: 2023-12-30 | Discharge: 2023-12-30 | Disposition: A | Discharge status: Against Medical Advice | Attending: Emergency Medicine | Admitting: Emergency Medicine

## 2023-12-30 ENCOUNTER — Encounter (HOSPITAL_COMMUNITY): Payer: Self-pay | Admitting: Emergency Medicine

## 2023-12-30 ENCOUNTER — Emergency Department (HOSPITAL_COMMUNITY)

## 2023-12-30 ENCOUNTER — Other Ambulatory Visit: Payer: Self-pay

## 2023-12-30 ENCOUNTER — Observation Stay (HOSPITAL_BASED_OUTPATIENT_CLINIC_OR_DEPARTMENT_OTHER)
Admission: EM | Admit: 2023-12-30 | Discharge: 2024-01-02 | Disposition: A | Discharge status: Home / Self Care | Attending: Family Medicine | Admitting: Family Medicine

## 2023-12-30 ENCOUNTER — Emergency Department (HOSPITAL_BASED_OUTPATIENT_CLINIC_OR_DEPARTMENT_OTHER)

## 2023-12-30 ENCOUNTER — Encounter (HOSPITAL_BASED_OUTPATIENT_CLINIC_OR_DEPARTMENT_OTHER): Payer: Self-pay

## 2023-12-30 DIAGNOSIS — Z955 Presence of coronary angioplasty implant and graft: Secondary | ICD-10-CM | POA: Diagnosis not present

## 2023-12-30 DIAGNOSIS — R109 Unspecified abdominal pain: Secondary | ICD-10-CM | POA: Diagnosis present

## 2023-12-30 DIAGNOSIS — K828 Other specified diseases of gallbladder: Secondary | ICD-10-CM

## 2023-12-30 DIAGNOSIS — Z7902 Long term (current) use of antithrombotics/antiplatelets: Secondary | ICD-10-CM | POA: Insufficient documentation

## 2023-12-30 DIAGNOSIS — R748 Abnormal levels of other serum enzymes: Secondary | ICD-10-CM | POA: Diagnosis present

## 2023-12-30 DIAGNOSIS — Z5321 Procedure and treatment not carried out due to patient leaving prior to being seen by health care provider: Secondary | ICD-10-CM | POA: Insufficient documentation

## 2023-12-30 DIAGNOSIS — E785 Hyperlipidemia, unspecified: Secondary | ICD-10-CM | POA: Diagnosis not present

## 2023-12-30 DIAGNOSIS — R079 Chest pain, unspecified: Secondary | ICD-10-CM | POA: Insufficient documentation

## 2023-12-30 DIAGNOSIS — R1084 Generalized abdominal pain: Secondary | ICD-10-CM

## 2023-12-30 DIAGNOSIS — Z951 Presence of aortocoronary bypass graft: Secondary | ICD-10-CM

## 2023-12-30 DIAGNOSIS — I2510 Atherosclerotic heart disease of native coronary artery without angina pectoris: Secondary | ICD-10-CM | POA: Diagnosis not present

## 2023-12-30 DIAGNOSIS — R1013 Epigastric pain: Secondary | ICD-10-CM | POA: Insufficient documentation

## 2023-12-30 DIAGNOSIS — D649 Anemia, unspecified: Secondary | ICD-10-CM | POA: Diagnosis not present

## 2023-12-30 DIAGNOSIS — R7989 Other specified abnormal findings of blood chemistry: Principal | ICD-10-CM

## 2023-12-30 DIAGNOSIS — E1165 Type 2 diabetes mellitus with hyperglycemia: Secondary | ICD-10-CM | POA: Diagnosis present

## 2023-12-30 DIAGNOSIS — K810 Acute cholecystitis: Secondary | ICD-10-CM

## 2023-12-30 DIAGNOSIS — K819 Cholecystitis, unspecified: Principal | ICD-10-CM | POA: Insufficient documentation

## 2023-12-30 LAB — BASIC METABOLIC PANEL WITH GFR
Anion gap: 12 (ref 5–15)
BUN: 12 mg/dL (ref 6–20)
CO2: 23 mmol/L (ref 22–32)
Calcium: 9.3 mg/dL (ref 8.9–10.3)
Chloride: 102 mmol/L (ref 98–111)
Creatinine, Ser: 0.74 mg/dL (ref 0.61–1.24)
GFR, Estimated: >60 mL/min (ref >60)
Glucose, Bld: 129 mg/dL — ABNORMAL HIGH (ref 70–99)
Potassium: 4.3 mmol/L (ref 3.5–5.1)
Sodium: 137 mmol/L (ref 135–145)

## 2023-12-30 LAB — TROPONIN I (HIGH SENSITIVITY): Troponin I (High Sensitivity): 11 ng/L (ref <18)

## 2023-12-30 LAB — CBC
HCT: 38.6 % — ABNORMAL LOW (ref 39.0–52.0)
Hemoglobin: 11.9 g/dL — ABNORMAL LOW (ref 13.0–17.0)
MCH: 26.6 pg (ref 26.0–34.0)
MCHC: 30.8 g/dL (ref 30.0–36.0)
MCV: 86.2 fL (ref 80.0–100.0)
Platelets: 199 K/uL (ref 150–400)
RBC: 4.48 MIL/uL (ref 4.22–5.81)
RDW: 13.3 % (ref 11.5–15.5)
WBC: 6.8 K/uL (ref 4.0–10.5)
nRBC: 0 % (ref 0.0–0.2)

## 2023-12-30 LAB — HEPATIC FUNCTION PANEL
ALT: 299 U/L — ABNORMAL HIGH (ref 0–44)
AST: 524 U/L — ABNORMAL HIGH (ref 15–41)
Albumin: 4.3 g/dL (ref 3.5–5.0)
Alkaline Phosphatase: 154 U/L — ABNORMAL HIGH (ref 38–126)
Bilirubin, Direct: 1.1 mg/dL — ABNORMAL HIGH (ref 0.0–0.2)
Indirect Bilirubin: 0.4 mg/dL (ref 0.3–0.9)
Total Bilirubin: 1.5 mg/dL — ABNORMAL HIGH (ref 0.0–1.2)
Total Protein: 7 g/dL (ref 6.5–8.1)

## 2023-12-30 LAB — TROPONIN T, HIGH SENSITIVITY: Troponin T High Sensitivity: <15 ng/L (ref 0–19)

## 2023-12-30 LAB — LIPASE, BLOOD: Lipase: 65 U/L — ABNORMAL HIGH (ref 11–51)

## 2023-12-30 MED ORDER — IOHEXOL 300 MG/ML  SOLN
100.0000 mL | Freq: Once | INTRAMUSCULAR | Status: AC | PRN
  Administered 2023-12-30: 100 mL via INTRAVENOUS

## 2023-12-30 MED ORDER — SODIUM CHLORIDE 0.9 % IV SOLN
2.0000 g | Freq: Once | INTRAVENOUS | Status: AC
  Administered 2023-12-30: 2 g via INTRAVENOUS
  Filled 2023-12-30: qty 20

## 2023-12-30 NOTE — ED Triage Notes (Signed)
 Pt reports epigastric pain since this AM. Denies N/V/D. CABG surgery 6 weeks ago.

## 2023-12-30 NOTE — ED Provider Triage Note (Signed)
 Emergency Medicine Provider Triage Evaluation Note  Marcus Hart , a 58 y.o. male  was evaluated in triage.  Pt complains of epigastric to left lower chest pain since this morning.  He reports initially 10/10, now around 6 or 7 out of 10.  Worse with laying flat.  Recent CABG performed.  Denies nausea, vomiting, diarrhea.  Denies any fever, chills.  His postoperative course was complicated by pleural effusion.  Has been compliant with his Plavix ..  Review of Systems  Positive: Chest pain, epigastric pain Negative:   Physical Exam  BP 129/84 (BP Location: Right Arm)   Pulse 91   Temp 98.2 F (36.8 C) (Oral)   Resp 20   SpO2 100%  Gen:   Awake, no distress   Resp:  Normal effort  MSK:   Moves extremities without difficulty  Other:    Medical Decision Making  Medically screening exam initiated at 1:16 PM.  Appropriate orders placed.  Marcus Hart was informed that the remainder of the evaluation will be completed by another provider, this initial triage assessment does not replace that evaluation, and the importance of remaining in the ED until their evaluation is complete.  Workup initiated in triage    Marcus Sherlean DEL, PA-C 12/30/23 1320

## 2023-12-30 NOTE — ED Provider Notes (Addendum)
 [place] EMERGENCY DEPARTMENT AT [facility] Provider Note   CSN: [id number] Arrival date & time: 12/30/23  2000     Patient presents with: Abdominal Pain   Marcus Hart is a 58 y.o. male who presents to the ED today with complaints of epigastric pain that also extends to the upper abdomen just above the umbilicus.  Originally presented to [facility], ED with the same, had 10/10 pain, initial workup completed there which did not show any acute elevations in his troponin and nor did show any abnormalities in his CBC or CMP.  X-ray imaging obtained at that point did show an elevated left hemidiaphragm indicative of increased intestinal gas.  They left [place] without being seen after a prolonged wait time.  Since that time he has had a reduction of his pain where he is currently stating it is 4-5/10, and he does endorse increased belching as well as increased flatus.  He has been tolerating both solid and liquid oral intake without difficulty, has had normal bowel movements with the last one being this morning.  Denies any dysuria.  Discharged from [facility] on 25 June after having an NSTEMI and requiring CABG.  Only other previous medical history noted is hypertension and type 2 diabetes.    Abdominal Pain      Prior to Admission medications   Medication Sig Start Date End Date Taking? Authorizing Provider  acetaminophen  (TYLENOL ) 325 MG tablet Take 2 tablets (650 mg total) by mouth every 6 (six) hours as needed for mild pain (pain score 1-3). 11/13/23   Raguel Con RAMAN, PA-C  amLODipine  (NORVASC ) 5 MG tablet Take 1 tablet (5 mg total) by mouth daily. 11/13/23   Raguel Con RAMAN, PA-C  amLODipine  (NORVASC ) 5 MG tablet Take 1 tablet (5 mg total) by mouth daily. 12/20/23   Rutha Manuelita HERO, PA-C  aspirin  EC 81 MG tablet Take 81 mg by mouth daily. Swallow whole.    [provider]  clopidogrel  (PLAVIX ) 75 MG tablet Take 1 tablet (75 mg total) by mouth  daily. 12/10/23   Trudy Birmingham, PA-C  Dulaglutide  (TRULICITY ) 3 MG/0.5ML SOAJ Inject 3 mg into the skin once a week. 10/08/23   Levora Reyes SAUNDERS, MD  empagliflozin  (JARDIANCE ) 10 MG TABS tablet Take 1 tablet (10 mg total) by mouth daily. 11/17/23   Tabori, Katherine E, MD  ezetimibe  (ZETIA ) 10 MG tablet Take 1 tablet (10 mg total) by mouth daily. 12/22/23 03/26/24  Williams, Evan, PA-C  furosemide  (LASIX ) 40 MG tablet Take 1 tablet (40 mg total) by mouth daily for 3 days then take as directed by physician. 11/29/23   Trudy Birmingham, PA-C  glucose blood (ONETOUCH VERIO) test strip Use as directed. 12/10/23   Levora Reyes SAUNDERS, MD  Insulin  Pen Needle (PEN NEEDLES) 32G X 4 MM MISC Use with Victoza  to inject once daily 06/30/21   Levora Reyes SAUNDERS, MD  Lancets [facility] DELICA PLUS [place]) MISC USE AS DIRECTED TO TEST BLOOD GLUCOSE 11/03/21   Levora Reyes SAUNDERS, MD  metFORMIN  (GLUCOPHAGE ) 1000 MG tablet Take 1 tablet (1,000 mg total) by mouth 2 (two) times daily with a meal. 12/16/23   Levora Reyes SAUNDERS, MD  nitroGLYCERIN  (NITROSTAT ) 0.4 MG SL tablet Place 1 tablet (0.4 mg total) under the tongue every 5 (five) minutes as needed for chest pain. If you need more than 3 doses in 24 hours, call 911 or go to nearest ER 12/29/23   Wonda Sharper, MD  oxyCODONE  (  OXY IR/ROXICODONE ) 5 MG immediate release tablet Take 1 tablet (5 mg total) by mouth every 6 (six) hours as needed for severe pain (pain score 7-10). 11/13/23   Raguel Con RAMAN, PA-C  potassium chloride  (KLOR-CON ) 10 MEQ tablet Take 1 tablet (10 mEq total) by mouth daily for 3 days then take as directed by physician. 11/29/23   Williams, Evan, PA-C  rosuvastatin  (CRESTOR ) 40 MG tablet Take 1 tablet (40 mg total) by mouth daily. 12/10/23   Trudy Birmingham, PA-C  Study - SOS-AMI - selatogrel 16 mg/0.5 mL or placebo SQ injection (PI-Christopher) Inject 0.5 mLs (16 mg total) into the skin as needed (As needed for for symptoms of a heart attack (chest pain).). 12/29/23    Lonni Slain, MD    Allergies: Pork-derived products    Review of Systems  Gastrointestinal:  Positive for abdominal pain.  All other systems reviewed and are negative.   Updated Vital Signs BP 125/82   Pulse 83   Temp 98.3 F (36.8 C) (Oral)   Resp 18   Ht 5' 9 (1.753 m)   Wt 74.4 kg   SpO2 100%   BMI 24.22 kg/m   Physical Exam Vitals and nursing note reviewed.  Constitutional:      General: He is not in acute distress.    Appearance: Normal appearance.  HENT:     Head: Normocephalic and atraumatic.     Mouth/Throat:     Mouth: Mucous membranes are moist.     Pharynx: Oropharynx is clear.  Eyes:     Extraocular Movements: Extraocular movements intact.     Conjunctiva/sclera: Conjunctivae normal.     Pupils: Pupils are equal, round, and reactive to light.  Cardiovascular:     Rate and Rhythm: Normal rate and regular rhythm.     Pulses: Normal pulses.     Heart sounds: Normal heart sounds. No murmur heard.    No friction rub. No gallop.  Pulmonary:     Effort: Pulmonary effort is normal.     Breath sounds: Normal breath sounds.  Abdominal:     General: Abdomen is flat. Bowel sounds are normal.     Palpations: Abdomen is soft.     Tenderness: There is no abdominal tenderness.  Musculoskeletal:        General: Normal range of motion.     Cervical back: Normal range of motion and neck supple.     Right lower leg: No edema.     Left lower leg: No edema.  Skin:    General: Skin is warm and dry.     Capillary Refill: Capillary refill takes less than 2 seconds.  Neurological:     General: No focal deficit present.     Mental Status: He is alert and oriented to person, place, and time. Mental status is at baseline.  Psychiatric:        Mood and Affect: Mood normal.     (all labs ordered are listed, but only abnormal results are displayed) Labs Reviewed  HEPATIC FUNCTION PANEL  LIPASE, BLOOD  TROPONIN T, HIGH SENSITIVITY     EKG: None  Radiology: CT ABDOMEN PELVIS W CONTRAST Result Date: 12/30/2023 CLINICAL DATA:  Epigastric pain. EXAM: CT ABDOMEN AND PELVIS WITH CONTRAST TECHNIQUE: Multidetector CT imaging of the abdomen and pelvis was performed using the standard protocol following bolus administration of intravenous contrast. RADIATION DOSE REDUCTION: This exam was performed according to the departmental dose-optimization program which includes automated exposure control, adjustment of the mA  and/or kV according to patient size and/or use of iterative reconstruction technique. CONTRAST:  OMNIPAQUE  IOHEXOL  300 MG/ML  SOLN COMPARISON:  None Available. FINDINGS: Lower chest: Multiple sternal wires are noted. No acute abnormality. Hepatobiliary: No focal liver abnormality is seen. Mild diffuse gallbladder wall thickening is seen without evidence of gallstones or pericholecystic inflammatory fat stranding. Pancreas: Unremarkable. No pancreatic ductal dilatation or surrounding inflammatory changes. Spleen: Normal in size without focal abnormality. Adrenals/Urinary Tract: Adrenal glands are unremarkable. Kidneys are normal in size, without renal calculi or hydronephrosis. A subcentimeter simple cyst is seen within the mid left kidney. Bladder is unremarkable. Stomach/Bowel: Stomach is within normal limits. The appendix is not clearly identified. No evidence of bowel wall thickening, distention, or inflammatory changes. Vascular/Lymphatic: No significant vascular findings are present. No enlarged abdominal or pelvic lymph nodes. Reproductive: The prostate gland is mildly enlarged. Other: No abdominal wall hernia or abnormality. No abdominopelvic ascites. Musculoskeletal: No acute or significant osseous findings. IMPRESSION: 1. Mild diffuse gallbladder wall thickening without evidence of gallstones or pericholecystic inflammatory fat stranding. Further evaluation with a right upper quadrant ultrasound is recommended if acute  cholecystitis is of clinical concern. 2. Mildly enlarged prostate gland. Correlation with PSA levels is recommended. Electronically Signed   By: Suzen Dials M.D.   On: 12/30/2023 22:17   DG Chest 1 View Result Date: 12/30/2023 CLINICAL DATA:  Acute epigastric and chest pain. EXAM: CHEST  1 VIEW COMPARISON:  December 20, 2023. FINDINGS: The heart size and mediastinal contours are within normal limits. Status post coronary artery bypass graft. Right lung is clear. Elevated left hemidiaphragm is noted with minimal left pleural effusion. The visualized skeletal structures are unremarkable. IMPRESSION: Elevated left hemidiaphragm with minimal left pleural effusion. Electronically Signed   By: Lynwood Landy Raddle M.D.   On: 12/30/2023 13:41     Procedures   Medications Ordered in the ED  iohexol  (OMNIPAQUE ) 300 MG/ML solution 100 mL (100 mLs Intravenous Contrast Given 12/30/23 2124)                                    Medical Decision Making Amount and/or Complexity of Data Reviewed Labs: ordered. Radiology: ordered.  Risk Prescription drug management.   Medical Decision Making:   Marcus Hart is a 58 y.o. male who presented to the ED today with upper abdominal pain detailed above.    External chart has been reviewed including previous admission records along with labs and imaging. Patient's presentation is complicated by their history of previous NSTEMI/CABG.  Patient placed on continuous vitals and telemetry monitoring while in ED which was reviewed periodically.  Complete initial physical exam performed, notably the patient  was alert and oriented no apparent distress with no notable exam findings..    Reviewed and confirmed nursing documentation for past medical history, family history, social history.    Initial Assessment:   With the patient's presentation of abdominal discomfort, most likely diagnosis is increased bowel gas. Other diagnoses were considered including  (but not limited to) bowel obstruction, mesenteric ischemia, pancreatitis, hepatobiliary disease. These are considered less likely due to history of present illness and physical exam findings.     Initial Plan:  Obtain CT of the abdomen pelvis to assess for presence of possible bowel obstruction. Screening labs were obtained at [facility] earlier today therefore we will defer reevaluation of his labs at this time. EKG to evaluate for  cardiac pathology Obtain hepatic function panel as well as serum lipase secondary to upper abdominal pain to rule out hepatobiliary or pancreatic etiology. Obtain single troponin to compare to previously noted troponin from [facility]. Objective evaluation as below reviewed   Initial Study Results:   Laboratory  All laboratory results reviewed without evidence of clinically relevant pathology.   Exceptions include: Hemoglobin was 11.9 which is increased from this patient compared to 1 month prior.  EKG EKG was reviewed independently. Rate, rhythm, axis, intervals all examined and without medically relevant abnormality. ST segments without concerns for elevations.    Radiology:  All images reviewed independently. Agree with radiology report at this time.   CT ABDOMEN PELVIS W CONTRAST Result Date: 12/30/2023 CLINICAL DATA:  Epigastric pain. EXAM: CT ABDOMEN AND PELVIS WITH CONTRAST TECHNIQUE: Multidetector CT imaging of the abdomen and pelvis was performed using the standard protocol following bolus administration of intravenous contrast. RADIATION DOSE REDUCTION: This exam was performed according to the departmental dose-optimization program which includes automated exposure control, adjustment of the mA and/or kV according to patient size and/or use of iterative reconstruction technique. CONTRAST:  OMNIPAQUE  IOHEXOL  300 MG/ML  SOLN COMPARISON:  None Available. FINDINGS: Lower chest: Multiple sternal wires are noted. No acute abnormality. Hepatobiliary: No  focal liver abnormality is seen. Mild diffuse gallbladder wall thickening is seen without evidence of gallstones or pericholecystic inflammatory fat stranding. Pancreas: Unremarkable. No pancreatic ductal dilatation or surrounding inflammatory changes. Spleen: Normal in size without focal abnormality. Adrenals/Urinary Tract: Adrenal glands are unremarkable. Kidneys are normal in size, without renal calculi or hydronephrosis. A subcentimeter simple cyst is seen within the mid left kidney. Bladder is unremarkable. Stomach/Bowel: Stomach is within normal limits. The appendix is not clearly identified. No evidence of bowel wall thickening, distention, or inflammatory changes. Vascular/Lymphatic: No significant vascular findings are present. No enlarged abdominal or pelvic lymph nodes. Reproductive: The prostate gland is mildly enlarged. Other: No abdominal wall hernia or abnormality. No abdominopelvic ascites. Musculoskeletal: No acute or significant osseous findings. IMPRESSION: 1. Mild diffuse gallbladder wall thickening without evidence of gallstones or pericholecystic inflammatory fat stranding. Further evaluation with a right upper quadrant ultrasound is recommended if acute cholecystitis is of clinical concern. 2. Mildly enlarged prostate gland. Correlation with PSA levels is recommended. Electronically Signed   By: Suzen Dials M.D.   On: 12/30/2023 22:17   DG Chest 1 View Result Date: 12/30/2023 CLINICAL DATA:  Acute epigastric and chest pain. EXAM: CHEST  1 VIEW COMPARISON:  December 20, 2023. FINDINGS: The heart size and mediastinal contours are within normal limits. Status post coronary artery bypass graft. Right lung is clear. Elevated left hemidiaphragm is noted with minimal left pleural effusion. The visualized skeletal structures are unremarkable. IMPRESSION: Elevated left hemidiaphragm with minimal left pleural effusion. Electronically Signed   By: Lynwood Landy Raddle M.D.   On: 12/30/2023 13:41   DG  Chest 2 View Result Date: 12/20/2023 CLINICAL DATA:  s/p cabg. EXAM: CHEST - 2 VIEW COMPARISON:  11/26/2023. FINDINGS: There is residual subtle left pleural effusion, significantly decreased since the prior study. Bilateral lung fields are clear. Right costophrenic angle is clear. No pneumothorax. Normal cardio-mediastinal silhouette. There are surgical staples along the heart border and sternotomy wires, status post CABG (coronary artery bypass graft). No acute osseous abnormalities. The soft tissues are within normal limits. IMPRESSION: Residual subtle left pleural effusion, significantly decreased since the prior study. Electronically Signed   By: Ree Molt M.D.   On: 12/20/2023  13:53    Reassessment and Plan:   At time of the care handoff, CT imaging of the abdomen and pelvis was pending.  Secondary to upper abdominal discomfort and to rule out hepatobiliary or pancreatic etiology, waiting for results of hepatic function panel and lipase.    Troponin resulted negative, and with his negative EKG and previously negative troponin, do not believe he has any concern for ACS at this time.  Plan for at this time is pending imaging results and results of hepatic function panel and lipase.  Still considering potential hepatobiliary disease given the nature of his pain and preliminary viewing of CT imaging.  CT scan did show diffuse thickening of the gallbladder wall without evidence of gallstones or pericystic colic inflammatory fat stranding.  Again disposition will be pending hepatic function panel and lipase, Limited ultrasound of the upper abdomen was recommended however is not available at this time.  Care handed off to J.Barrett, PA-C.       Final diagnoses:  None    ED Discharge Orders     None          Myriam Dorn BROCKS, PA 12/30/23 2201    Myriam Dorn BROCKS, [place] 12/30/23 2225    Patsey Lot, MD 12/31/23 1451

## 2023-12-30 NOTE — ED Triage Notes (Signed)
 Pt POV reporting epigastric abd pain that began this morning, denies n/v/d, afebrile. Seen in ED earlier, blood work and xray obtained, left due to wait times. CABG 6 weeks ago.

## 2023-12-30 NOTE — ED Provider Notes (Signed)
  Physical Exam  BP 125/82   Pulse 83   Temp 98.3 F (36.8 C) (Oral)   Resp 18   Ht 5' 9 (1.753 m)   Wt 74.4 kg   SpO2 100%   BMI 24.22 kg/m   Physical Exam Constitutional:      Appearance: He is well-developed and normal weight.  Cardiovascular:     Rate and Rhythm: Normal rate and regular rhythm.  Abdominal:     General: Abdomen is flat. Bowel sounds are normal.     Palpations: Abdomen is soft.     Tenderness: There is abdominal tenderness. There is no right CVA tenderness or left CVA tenderness.  Neurological:     Mental Status: He is alert.     Procedures  Procedures  ED Course / MDM   Clinical Course as of 12/30/23 2349  Thu Dec 30, 2023  2237 Dr Teresa Gen Surg on call for [facility].  [JB]  2346 Dr Perri [JB]    Clinical Course User Index [JB] Demetrios Byron, Warren SAILOR, PA-C   Medical Decision Making Amount and/or Complexity of Data Reviewed Labs: ordered. Radiology: ordered.  Risk Prescription drug management. Decision regarding hospitalization.   Patient received from prior ED provider at sign off.  In short patient had upper abdominal pain around epigastric area associated with nausea and vomiting that started this morning.  He denies any nausea vomiting diarrhea.  Of note he recently had CABG and has been compliant with his postop medications.  CT scan here shows mild diffuse gallbladder wall thickening without stones or Perry cholecystic changes.  Also has bump in LFTs and total bili.  I did discuss this with general surgery.  They are recommending right upper quadrant ultrasound.  They would recommend bringing him in to the hospital with medicine admit and they will see in consult. Added on EtOH, Acet and Hepatitis panel.   I will reach out to hospitalist team for admission.       Leora Platt N, PA-C 12/30/23 2350    Carita Senior, MD 12/31/23 [phone]

## 2023-12-30 NOTE — Treatment Plan (Signed)
 [facility] Hospitalist Service  58 yo with hx recent CABG (10/2023), dyslipidemia, T2DM presenting with epigastric pain.  Workup so far notable for elevated LFT's.  Denies etoh.  CT with diffuse gallbladder thickening (mild).  EDP discussed with gen surg who recommended follow up with US  first to help decide next steps.  Pending APAP, etoh, and acute hepatitis panel.  Labs notable for elevated AST/ALT (524/299), lipase 65, alk phos 154.  bili 1.5.  Mild hyperglycemia.  Mild anemia.  Will admit to med surg tele for additional w/u of his epigastric pain and elevated LFT's.

## 2023-12-31 ENCOUNTER — Ambulatory Visit: Admitting: Family Medicine

## 2023-12-31 ENCOUNTER — Encounter (HOSPITAL_BASED_OUTPATIENT_CLINIC_OR_DEPARTMENT_OTHER): Payer: Self-pay | Admitting: Family Medicine

## 2023-12-31 ENCOUNTER — Inpatient Hospital Stay (HOSPITAL_COMMUNITY)

## 2023-12-31 DIAGNOSIS — E1165 Type 2 diabetes mellitus with hyperglycemia: Secondary | ICD-10-CM | POA: Diagnosis not present

## 2023-12-31 DIAGNOSIS — K810 Acute cholecystitis: Secondary | ICD-10-CM

## 2023-12-31 DIAGNOSIS — R109 Unspecified abdominal pain: Secondary | ICD-10-CM

## 2023-12-31 LAB — CBC WITH DIFFERENTIAL/PLATELET
Abs Immature Granulocytes: 0 K/uL (ref 0.00–0.07)
Basophils Absolute: 0 K/uL (ref 0.0–0.1)
Basophils Relative: 1 %
Eosinophils Absolute: 0 K/uL (ref 0.0–0.5)
Eosinophils Relative: 1 %
HCT: 36.8 % — ABNORMAL LOW (ref 39.0–52.0)
Hemoglobin: 11.8 g/dL — ABNORMAL LOW (ref 13.0–17.0)
Immature Granulocytes: 0 %
Lymphocytes Relative: 30 %
Lymphs Abs: 0.9 K/uL (ref 0.7–4.0)
MCH: 26.5 pg (ref 26.0–34.0)
MCHC: 32.1 g/dL (ref 30.0–36.0)
MCV: 82.5 fL (ref 80.0–100.0)
Monocytes Absolute: 0.2 K/uL (ref 0.1–1.0)
Monocytes Relative: 6 %
Neutro Abs: 1.7 K/uL (ref 1.7–7.7)
Neutrophils Relative %: 62 %
Platelets: 179 K/uL (ref 150–400)
RBC: 4.46 MIL/uL (ref 4.22–5.81)
RDW: 13.5 % (ref 11.5–15.5)
WBC: 2.8 K/uL — ABNORMAL LOW (ref 4.0–10.5)
nRBC: 0 % (ref 0.0–0.2)

## 2023-12-31 LAB — GLUCOSE, CAPILLARY
Glucose-Capillary: 168 mg/dL — ABNORMAL HIGH (ref 70–99)
Glucose-Capillary: 152 mg/dL — ABNORMAL HIGH (ref 70–99)
Glucose-Capillary: 109 mg/dL — ABNORMAL HIGH (ref 70–99)
Glucose-Capillary: 106 mg/dL — ABNORMAL HIGH (ref 70–99)
Glucose-Capillary: 96 mg/dL (ref 70–99)
Glucose-Capillary: 182 mg/dL — ABNORMAL HIGH (ref 70–99)
Glucose-Capillary: 172 mg/dL — ABNORMAL HIGH (ref 70–99)
Glucose-Capillary: 226 mg/dL — ABNORMAL HIGH (ref 70–99)

## 2023-12-31 LAB — TROPONIN I (HIGH SENSITIVITY): Troponin I (High Sensitivity): 13 ng/L (ref <18)

## 2023-12-31 LAB — BASIC METABOLIC PANEL WITH GFR
Anion gap: 9 (ref 5–15)
BUN: 13 mg/dL (ref 6–20)
CO2: 21 mmol/L — ABNORMAL LOW (ref 22–32)
Calcium: 9 mg/dL (ref 8.9–10.3)
Chloride: 106 mmol/L (ref 98–111)
Creatinine, Ser: 0.61 mg/dL (ref 0.61–1.24)
GFR, Estimated: >60 mL/min (ref >60)
Glucose, Bld: 157 mg/dL — ABNORMAL HIGH (ref 70–99)
Potassium: 4 mmol/L (ref 3.5–5.1)
Sodium: 136 mmol/L (ref 135–145)

## 2023-12-31 LAB — HEPATITIS PANEL, ACUTE
HCV Ab: NONREACTIVE
HCV Ab: NONREACTIVE
Hep A IgM: NONREACTIVE
Hep A IgM: NONREACTIVE
Hep B C IgM: NONREACTIVE
Hep B C IgM: NONREACTIVE
Hepatitis B Surface Ag: NONREACTIVE
Hepatitis B Surface Ag: NONREACTIVE

## 2023-12-31 LAB — HEPATIC FUNCTION PANEL
ALT: 625 U/L — ABNORMAL HIGH (ref 0–44)
AST: 1041 U/L — ABNORMAL HIGH (ref 15–41)
Albumin: 3.5 g/dL (ref 3.5–5.0)
Alkaline Phosphatase: 169 U/L — ABNORMAL HIGH (ref 38–126)
Bilirubin, Direct: 0.7 mg/dL — ABNORMAL HIGH (ref 0.0–0.2)
Indirect Bilirubin: 0.5 mg/dL (ref 0.3–0.9)
Total Bilirubin: 1.2 mg/dL (ref 0.0–1.2)
Total Protein: 6.4 g/dL — ABNORMAL LOW (ref 6.5–8.1)

## 2023-12-31 LAB — SURGICAL PCR SCREEN
MRSA, PCR: NEGATIVE
Staphylococcus aureus: NEGATIVE

## 2023-12-31 LAB — PROTIME-INR
INR: 1 (ref 0.8–1.2)
Prothrombin Time: 13.2 s (ref 11.4–15.2)

## 2023-12-31 LAB — ETHANOL: Alcohol, Ethyl (B): <15 mg/dL (ref <15)

## 2023-12-31 LAB — ACETAMINOPHEN LEVEL: Acetaminophen (Tylenol), Serum: <10 ug/mL — ABNORMAL LOW (ref 10–30)

## 2023-12-31 MED ORDER — EMPAGLIFLOZIN 10 MG PO TABS
10.0000 mg | ORAL_TABLET | Freq: Every day | ORAL | Status: DC
  Administered 2024-01-02: 10 mg via ORAL
  Filled 2023-12-31: qty 1

## 2023-12-31 MED ORDER — EZETIMIBE 10 MG PO TABS
10.0000 mg | ORAL_TABLET | Freq: Every day | ORAL | Status: DC
  Filled 2023-12-31 (×2): qty 1

## 2023-12-31 MED ORDER — LACTATED RINGERS IV SOLN
INTRAVENOUS | Status: AC
Start: 2023-12-31 — End: 2024-01-01

## 2023-12-31 MED ORDER — ASPIRIN 81 MG PO TBEC
81.0000 mg | DELAYED_RELEASE_TABLET | Freq: Every day | ORAL | Status: DC
  Administered 2023-12-31 – 2024-01-02 (×3): 81 mg via ORAL
  Filled 2023-12-31 (×2): qty 1

## 2023-12-31 MED ORDER — INSULIN ASPART 100 UNIT/ML IJ SOLN
0.0000 [IU] | INTRAMUSCULAR | Status: DC
  Administered 2023-12-31 (×2): 1 [IU] via SUBCUTANEOUS
  Administered 2023-12-31 – 2024-01-01 (×2): 2 [IU] via SUBCUTANEOUS
  Administered 2024-01-01: 1 [IU] via SUBCUTANEOUS

## 2023-12-31 MED ORDER — PIPERACILLIN-TAZOBACTAM 3.375 G IVPB
3.3750 g | Freq: Three times a day (TID) | INTRAVENOUS | Status: DC
  Administered 2023-12-31 – 2024-01-01 (×4): 3.375 g via INTRAVENOUS
  Filled 2023-12-31 (×5): qty 50

## 2023-12-31 MED ORDER — CLOPIDOGREL BISULFATE 75 MG PO TABS
75.0000 mg | ORAL_TABLET | Freq: Every day | ORAL | Status: DC
  Administered 2023-12-31 – 2024-01-02 (×3): 75 mg via ORAL
  Filled 2023-12-31 (×3): qty 1

## 2023-12-31 MED ORDER — EMPAGLIFLOZIN 10 MG PO TABS
10.0000 mg | ORAL_TABLET | Freq: Every day | ORAL | Status: DC
  Filled 2023-12-31: qty 1

## 2023-12-31 NOTE — Progress Notes (Signed)
 2nd PIV started for IVF. Patient/family updated on nursing POC. Patient c/o minimal abdominal pain and requested to eat and speak to MD in regards to his morning medication. Orders explained. Notify MD of pt/family's concerns.

## 2023-12-31 NOTE — Consult Note (Signed)
 Consulting Physician: Deward PARAS Alijah Akram  Referring Provider: Dr. Franky  Chief Complaint: Abdominal pain  Reason for Consult: Cholecystitis   Subjective   HPI: Marcus Hart is an 58 y.o. male who is here for abdominal pain.  Epigastric pain.  Associated with nausea and vomiting.  Pain started yesterday morning after tea and cookies.  It reached a 10 out of 10.  They were seen at one ER and all the focus was on his heart which was fine, so then they went to another ER and got a CT which showed signs of cholecystitis.  He had a CABG about 6 weeks ago.  Past Medical History:  Diagnosis Date   Diabetes mellitus (HCC)    Diabetes mellitus without complication (HCC)    Phreesia 11/10/2019   Hyperlipidemia    STEMI (ST elevation myocardial infarction) [facility])     Past Surgical History:  Procedure Laterality Date   COLONOSCOPY     CORONARY ARTERY BYPASS GRAFT N/A 11/08/2023   Procedure: CORONARY ARTERY BYPASS GRAFTING X TWO USING LEFT INTERNAL MAMMARY ARTERY AND LEFT RADIAL ARTERY;  Surgeon: Kerrin Elspeth BROCKS, MD;  Location: [facility] OR;  Service: Open Heart Surgery;  Laterality: N/A;   CORONARY PRESSURE/FFR STUDY N/A 11/04/2023   Procedure: CORONARY PRESSURE/FFR STUDY;  Surgeon: Elmira Newman PARAS, MD;  Location: [facility] INVASIVE CV LAB;  Service: Cardiovascular;  Laterality: N/A;   CORONARY ULTRASOUND/IVUS N/A 11/04/2023   Procedure: Coronary Ultrasound/IVUS;  Surgeon: Elmira Newman PARAS, MD;  Location: [facility] INVASIVE CV LAB;  Service: Cardiovascular;  Laterality: N/A;   INTRAOPERATIVE TRANSESOPHAGEAL ECHOCARDIOGRAM N/A 11/08/2023   Procedure: ECHOCARDIOGRAM, TRANSESOPHAGEAL, INTRAOPERATIVE;  Surgeon: Kerrin Elspeth BROCKS, MD;  Location: [facility] OR;  Service: Open Heart Surgery;  Laterality: N/A;   LEFT HEART CATH AND CORONARY ANGIOGRAPHY N/A 11/04/2023   Procedure: LEFT HEART CATH AND CORONARY ANGIOGRAPHY;  Surgeon: Elmira Newman PARAS, MD;  Location: [facility] INVASIVE CV LAB;   Service: Cardiovascular;  Laterality: N/A;   NO PAST SURGERIES     RADIAL ARTERY HARVEST Left 11/08/2023   Procedure: SURGICAL PROCUREMENT, ARTERY, RADIAL;  Surgeon: Kerrin Elspeth BROCKS, MD;  Location: [facility] OR;  Service: Open Heart Surgery;  Laterality: Left;   UPPER GASTROINTESTINAL ENDOSCOPY      Family History  Problem Relation Age of Onset   Diabetes Mother    Bladder Cancer Mother    Hypertension Mother    Ulcers Father    Benign prostatic hyperplasia Father    Colon cancer Neg Hx    Colon polyps Neg Hx    Esophageal cancer Neg Hx    Rectal cancer Neg Hx    Stomach cancer Neg Hx     Social:  reports that he has never smoked. He has never used smokeless tobacco. He reports that he does not drink alcohol and does not use drugs.  Allergies:  Allergies  Allergen Reactions   Pork-Derived Products Other (See Comments)    Religious     Medications: Current Outpatient Medications  Medication Instructions   acetaminophen  (TYLENOL ) 650 mg, Oral, Every 6 hours PRN   amLODipine  (NORVASC ) 5 mg, Oral, Daily   aspirin  EC 81 mg, Daily   clopidogrel  (PLAVIX ) 75 mg, Oral, Daily   ezetimibe  (ZETIA ) 10 mg, Oral, Daily   furosemide  (LASIX ) 40 MG tablet Take 1 tablet (40 mg total) by mouth daily for 3 days then take as directed by physician.   Insulin  Pen Needle (PEN NEEDLES) 32G X 4 MM MISC Use with Victoza  to inject  once daily   Jardiance  10 mg, Oral, Daily   Lancets (ONETOUCH DELICA PLUS LANCET33G) MISC USE AS DIRECTED TO TEST BLOOD GLUCOSE   metFORMIN  (GLUCOPHAGE ) 1,000 mg, Oral, 2 times daily with meals   Multiple Vitamins-Minerals (MULTIVITAMIN WITH MINERALS) tablet 1 tablet, Oral, Daily   nitroGLYCERIN  (NITROSTAT ) 0.4 mg, Sublingual, Every 5 min PRN, If you need more than 3 doses in 24 hours, call 911 or go to nearest ER   oxyCODONE  (OXY IR/ROXICODONE ) 5 mg, Oral, Every 6 hours PRN   potassium chloride  (KLOR-CON ) 10 MEQ tablet Take 1 tablet (10 mEq total) by mouth daily for 3 days  then take as directed by physician.   predniSONE  (DELTASONE ) 40 mg, Daily   rosuvastatin  (CRESTOR ) 40 mg, Oral, Daily   SOS-AMI selatogrel or placebo 16 mg, Subcutaneous, As needed   Trulicity  3 mg, Subcutaneous, Weekly    ROS - all of the below systems have been reviewed with the patient and positives are indicated with bold text General: chills, fever or night sweats Eyes: blurry vision or double vision ENT: epistaxis or sore throat Allergy/Immunology: itchy/watery eyes or nasal congestion Hematologic/Lymphatic: bleeding problems, blood clots or swollen lymph nodes Endocrine: temperature intolerance or unexpected weight changes Breast: new or changing breast lumps or nipple discharge Resp: cough, shortness of breath, or wheezing CV: chest pain or dyspnea on exertion GI: as per HPI GU: dysuria, trouble voiding, or hematuria MSK: joint pain or joint stiffness Neuro: TIA or stroke symptoms Derm: pruritus and skin lesion changes Psych: anxiety and depression  Objective   PE Blood pressure 122/87, pulse 67, temperature 97.8 F (36.6 C), temperature source Oral, resp. rate 17, height 5' 9 (1.753 m), weight 74.4 kg, SpO2 100%. Constitutional: NAD; conversant; no deformities Eyes: Moist conjunctiva; no lid lag; anicteric; PERRL Neck: Trachea midline; no thyromegaly Lungs: Normal respiratory effort; no tactile fremitus CV: RRR; no palpable thrills; no pitting edema GI: Abd Soft, nontender, mild RUQ tenderness; no palpable hepatosplenomegaly MSK: Normal range of motion of extremities; no clubbing/cyanosis Psychiatric: Appropriate affect; alert and oriented x3 Lymphatic: No palpable cervical or axillary lymphadenopathy  Results for orders placed or performed during the hospital encounter of 12/30/23 (from the past 24 hours)  Troponin T, High Sensitivity     Status: None   Collection Time: 12/30/23  9:18 PM  Result Value Ref Range   Troponin T High Sensitivity <15 0 - 19 ng/L   Hepatic function panel     Status: Abnormal   Collection Time: 12/30/23  9:30 PM  Result Value Ref Range   Total Protein 7.0 6.5 - 8.1 g/dL   Albumin  4.3 3.5 - 5.0 g/dL   AST 475 (H) 15 - 41 U/L   ALT 299 (H) 0 - 44 U/L   Alkaline Phosphatase 154 (H) 38 - 126 U/L   Total Bilirubin 1.5 (H) 0.0 - 1.2 mg/dL   Bilirubin, Direct 1.1 (H) 0.0 - 0.2 mg/dL   Indirect Bilirubin 0.4 0.3 - 0.9 mg/dL  Lipase, blood     Status: Abnormal   Collection Time: 12/30/23  9:30 PM  Result Value Ref Range   Lipase 65 (H) 11 - 51 U/L  Protime-INR     Status: None   Collection Time: 12/30/23 11:25 PM  Result Value Ref Range   Prothrombin Time 13.2 11.4 - 15.2 seconds   INR 1.0 0.8 - 1.2  Hepatitis panel, acute     Status: None   Collection Time: 12/30/23 11:26 PM  Result Value Ref  Range   Hepatitis B Surface Ag NON REACTIVE NON REACTIVE   HCV Ab NON REACTIVE NON REACTIVE   Hep A IgM NON REACTIVE NON REACTIVE   Hep B C IgM NON REACTIVE NON REACTIVE  Acetaminophen  level     Status: Abnormal   Collection Time: 12/30/23 11:26 PM  Result Value Ref Range   Acetaminophen  (Tylenol ), Serum <10 (L) 10 - 30 ug/mL  Ethanol     Status: None   Collection Time: 12/30/23 11:26 PM  Result Value Ref Range   Alcohol, Ethyl (B) <15 <15 mg/dL  Glucose, capillary     Status: Abnormal   Collection Time: 12/31/23  1:24 AM  Result Value Ref Range   Glucose-Capillary 168 (H) 70 - 99 mg/dL  Glucose, capillary     Status: Abnormal   Collection Time: 12/31/23  5:42 AM  Result Value Ref Range   Glucose-Capillary 152 (H) 70 - 99 mg/dL  Basic metabolic panel     Status: Abnormal   Collection Time: 12/31/23  6:44 AM  Result Value Ref Range   Sodium 136 135 - 145 mmol/L   Potassium 4.0 3.5 - 5.1 mmol/L   Chloride 106 98 - 111 mmol/L   CO2 21 (L) 22 - 32 mmol/L   Glucose, Bld 157 (H) 70 - 99 mg/dL   BUN 13 6 - 20 mg/dL   Creatinine, Ser 9.38 0.61 - 1.24 mg/dL   Calcium  9.0 8.9 - 10.3 mg/dL   GFR, Estimated >39 >39  mL/min   Anion gap 9 5 - 15  Hepatic function panel     Status: Abnormal   Collection Time: 12/31/23  6:44 AM  Result Value Ref Range   Total Protein 6.4 (L) 6.5 - 8.1 g/dL   Albumin  3.5 3.5 - 5.0 g/dL   AST 8,958 (H) 15 - 41 U/L   ALT 625 (H) 0 - 44 U/L   Alkaline Phosphatase 169 (H) 38 - 126 U/L   Total Bilirubin 1.2 0.0 - 1.2 mg/dL   Bilirubin, Direct 0.7 (H) 0.0 - 0.2 mg/dL   Indirect Bilirubin 0.5 0.3 - 0.9 mg/dL  CBC with Differential/Platelet     Status: Abnormal   Collection Time: 12/31/23  6:44 AM  Result Value Ref Range   WBC 2.8 (L) 4.0 - 10.5 K/uL   RBC 4.46 4.22 - 5.81 MIL/uL   Hemoglobin 11.8 (L) 13.0 - 17.0 g/dL   HCT 63.1 (L) 60.9 - 47.9 %   MCV 82.5 80.0 - 100.0 fL   MCH 26.5 26.0 - 34.0 pg   MCHC 32.1 30.0 - 36.0 g/dL   RDW 86.4 88.4 - 84.4 %   Platelets 179 150 - 400 K/uL   nRBC 0.0 0.0 - 0.2 %   Neutrophils Relative % 62 %   Neutro Abs 1.7 1.7 - 7.7 K/uL   Lymphocytes Relative 30 %   Lymphs Abs 0.9 0.7 - 4.0 K/uL   Monocytes Relative 6 %   Monocytes Absolute 0.2 0.1 - 1.0 K/uL   Eosinophils Relative 1 %   Eosinophils Absolute 0.0 0.0 - 0.5 K/uL   Basophils Relative 1 %   Basophils Absolute 0.0 0.0 - 0.1 K/uL   Immature Granulocytes 0 %   Abs Immature Granulocytes 0.00 0.00 - 0.07 K/uL  Glucose, capillary     Status: Abnormal   Collection Time: 12/31/23  8:20 AM  Result Value Ref Range   Glucose-Capillary 109 (H) 70 - 99 mg/dL  Glucose, capillary  Status: Abnormal   Collection Time: 12/31/23  9:58 AM  Result Value Ref Range   Glucose-Capillary 106 (H) 70 - 99 mg/dL  Glucose, capillary     Status: None   Collection Time: 12/31/23 11:46 AM  Result Value Ref Range   Glucose-Capillary 96 70 - 99 mg/dL     Imaging Orders         CT ABDOMEN PELVIS W CONTRAST         US  Abdomen Limited RUQ (LIVER/GB)      Assessment and Plan   Marcus Hart is an 58 y.o. male with abdominal pain and a thickened gallbladder wall concerning for  acalculous cholecystitis.  I think he likely has cholecystitis, but his abdominal pain is improved, though his LFTs are worsening.  I recommend checking a HIDA scan to evaluate.  If that is positive we would plan on cholecystectomy in the morning.  I would not delay surgery to wait Aspirin  and Plavix  to wash out as the transaminitis elevation may signify severe gangrenous cholecystitis.  This would also avoid interruption in his antiplatelet therapy soon after a CABG.  If the HIDA is normal, I would defer to the medical team to evaluate for medical reasons for his presentation.  Surgery team will follow closely.  Okay for diet after HIDA before midnight.  NPO at Midnight if HIDA positive for cholecystitis.    ICD-10-CM   1. LFT elevation  R79.89     2. Generalized abdominal pain  R10.84     3. Thickening of wall of gallbladder  K82.8        Deward JINNY Foy, MD  [facility] Use [email or web address] to contact on call provider  New Patient Billing: [phone] - High MDM

## 2023-12-31 NOTE — Plan of Care (Signed)
   Problem: Education: Goal: Knowledge of General Education information will improve Description: Including pain rating scale, medication(s)/side effects and non-pharmacologic comfort measures Outcome: Progressing   Problem: Nutrition: Goal: Adequate nutrition will be maintained Outcome: Progressing

## 2023-12-31 NOTE — Plan of Care (Signed)

## 2023-12-31 NOTE — Progress Notes (Signed)
 Discussed plan for HIDA with pt and family. Explained that pt will be NPO and no pain medications after midnight. Verbalized understanding.

## 2023-12-31 NOTE — H&P (Addendum)
 History and Physical    Marcus Hart FMW:[id number] DOB: 1966/05/16 DOA: 12/30/2023  Patient coming from: Home.  Chief Complaint: Abdominal pain.  HPI: Gerlad Pelzel Schafer is a 58 y.o. male with history of CAD status post recent CABG on June 2025 with history of diabetes mellitus type 2 hyperlipidemia, anemia presents to the ER with complaints of abdominal pain.  Patient states his abdominal pain started yesterday after having tea and cookies in the morning.  Pain is epigastric persistent with some nausea and so patient presents to the ER.  Denies any chest pain or shortness of breath.  Denies any fever chills or diarrhea.  Patient's statin dose was increased last month and also Zetia  was added recently.  ED Course: In ER patient's labs show elevated LFTs and mildly elevated lipase.  CT abdomen pelvis shows features concerning for acute cholecystitis.  ER physician discussed with on-call general surgeon who requested getting ultrasound abdomen and they will see patient in consult.  Ultrasound abdomen was done after patient reaching at [facility] which shows features concerning for acute cholecystitis.  Labs show alkaline phosphatase of 154 lipase 65 AST 524 ALT 299 total bilirubin 1.5.  Troponins were negative.  Hemoglobin Levan 0.9 WBC 6.8.  Review of Systems: As per HPI, rest all negative.   Past Medical History:  Diagnosis Date   Diabetes mellitus (HCC)    Diabetes mellitus without complication (HCC)    Phreesia 11/10/2019   Hyperlipidemia    STEMI (ST elevation myocardial infarction) [facility])     Past Surgical History:  Procedure Laterality Date   COLONOSCOPY     CORONARY ARTERY BYPASS GRAFT N/A 11/08/2023   Procedure: CORONARY ARTERY BYPASS GRAFTING X TWO USING LEFT INTERNAL MAMMARY ARTERY AND LEFT RADIAL ARTERY;  Surgeon: Kerrin Elspeth BROCKS, MD;  Location: [facility] OR;  Service: Open Heart Surgery;  Laterality: N/A;   CORONARY PRESSURE/FFR STUDY N/A  11/04/2023   Procedure: CORONARY PRESSURE/FFR STUDY;  Surgeon: Elmira Newman PARAS, MD;  Location: [facility] INVASIVE CV LAB;  Service: Cardiovascular;  Laterality: N/A;   CORONARY ULTRASOUND/IVUS N/A 11/04/2023   Procedure: Coronary Ultrasound/IVUS;  Surgeon: Elmira Newman PARAS, MD;  Location: [facility] INVASIVE CV LAB;  Service: Cardiovascular;  Laterality: N/A;   INTRAOPERATIVE TRANSESOPHAGEAL ECHOCARDIOGRAM N/A 11/08/2023   Procedure: ECHOCARDIOGRAM, TRANSESOPHAGEAL, INTRAOPERATIVE;  Surgeon: Kerrin Elspeth BROCKS, MD;  Location: [facility] OR;  Service: Open Heart Surgery;  Laterality: N/A;   LEFT HEART CATH AND CORONARY ANGIOGRAPHY N/A 11/04/2023   Procedure: LEFT HEART CATH AND CORONARY ANGIOGRAPHY;  Surgeon: Elmira Newman PARAS, MD;  Location: [facility] INVASIVE CV LAB;  Service: Cardiovascular;  Laterality: N/A;   NO PAST SURGERIES     RADIAL ARTERY HARVEST Left 11/08/2023   Procedure: SURGICAL PROCUREMENT, ARTERY, RADIAL;  Surgeon: Kerrin Elspeth BROCKS, MD;  Location: [facility] OR;  Service: Open Heart Surgery;  Laterality: Left;   UPPER GASTROINTESTINAL ENDOSCOPY       reports that he has never smoked. He has never used smokeless tobacco. He reports that he does not drink alcohol and does not use drugs.  Allergies  Allergen Reactions   Pork-Derived Products Other (See Comments)    Family History  Problem Relation Age of Onset   Diabetes Mother    Bladder Cancer Mother    Hypertension Mother    Ulcers Father    Benign prostatic hyperplasia Father    Colon cancer Neg Hx    Colon polyps Neg Hx    Esophageal cancer Neg Hx  Rectal cancer Neg Hx    Stomach cancer Neg Hx     Prior to Admission medications   Medication Sig Start Date End Date Taking? Authorizing Provider  acetaminophen  (TYLENOL ) 325 MG tablet Take 2 tablets (650 mg total) by mouth every 6 (six) hours as needed for mild pain (pain score 1-3). 11/13/23   Raguel Con RAMAN, PA-C  amLODipine  (NORVASC ) 5 MG tablet Take 1 tablet (5 mg total) by  mouth daily. 11/13/23   Raguel Con RAMAN, PA-C  amLODipine  (NORVASC ) 5 MG tablet Take 1 tablet (5 mg total) by mouth daily. 12/20/23   Rutha Manuelita HERO, PA-C  aspirin  EC 81 MG tablet Take 81 mg by mouth daily. Swallow whole.    [provider]  clopidogrel  (PLAVIX ) 75 MG tablet Take 1 tablet (75 mg total) by mouth daily. 12/10/23   Trudy Birmingham, PA-C  Dulaglutide  (TRULICITY ) 3 MG/0.5ML SOAJ Inject 3 mg into the skin once a week. 10/08/23   Levora Reyes SAUNDERS, MD  empagliflozin  (JARDIANCE ) 10 MG TABS tablet Take 1 tablet (10 mg total) by mouth daily. 11/17/23   Tabori, Katherine E, MD  ezetimibe  (ZETIA ) 10 MG tablet Take 1 tablet (10 mg total) by mouth daily. 12/22/23 03/26/24  Williams, Evan, PA-C  furosemide  (LASIX ) 40 MG tablet Take 1 tablet (40 mg total) by mouth daily for 3 days then take as directed by physician. 11/29/23   Trudy Birmingham, PA-C  glucose blood (ONETOUCH VERIO) test strip Use as directed. 12/10/23   Levora Reyes SAUNDERS, MD  Insulin  Pen Needle (PEN NEEDLES) 32G X 4 MM MISC Use with Victoza  to inject once daily 06/30/21   Levora Reyes SAUNDERS, MD  Lancets [facility] DELICA PLUS [place]) MISC USE AS DIRECTED TO TEST BLOOD GLUCOSE 11/03/21   Levora Reyes SAUNDERS, MD  metFORMIN  (GLUCOPHAGE ) 1000 MG tablet Take 1 tablet (1,000 mg total) by mouth 2 (two) times daily with a meal. 12/16/23   Levora Reyes SAUNDERS, MD  nitroGLYCERIN  (NITROSTAT ) 0.4 MG SL tablet Place 1 tablet (0.4 mg total) under the tongue every 5 (five) minutes as needed for chest pain. If you need more than 3 doses in 24 hours, call 911 or go to nearest ER 12/29/23   Wonda Sharper, MD  oxyCODONE  (OXY IR/ROXICODONE ) 5 MG immediate release tablet Take 1 tablet (5 mg total) by mouth every 6 (six) hours as needed for severe pain (pain score 7-10). 11/13/23   Raguel Con RAMAN, PA-C  potassium chloride  (KLOR-CON ) 10 MEQ tablet Take 1 tablet (10 mEq total) by mouth daily for 3 days then take as directed by physician. 11/29/23   Williams,  Evan, PA-C  rosuvastatin  (CRESTOR ) 40 MG tablet Take 1 tablet (40 mg total) by mouth daily. 12/10/23   Trudy Birmingham, PA-C  Study - SOS-AMI - selatogrel 16 mg/0.5 mL or placebo SQ injection (PI-Christopher) Inject 0.5 mLs (16 mg total) into the skin as needed (As needed for for symptoms of a heart attack (chest pain).). 12/29/23   Lonni Slain, MD    Physical Exam: Constitutional: Moderately built and nourished. Vitals:   12/31/23 0000 12/31/23 0009 12/31/23 0015 12/31/23 0119  BP: 116/75  (!) 127/90 121/76  Pulse: 88  91 89  Resp:   18 15  Temp:  98 F (36.7 C)  98.3 F (36.8 C)  TempSrc:  Oral  Oral  SpO2: 98%  100% 100%  Weight:      Height:       Eyes: Anicteric no pallor. ENMT: No discharge  from the ears eyes nose or mouth. Neck: No mass felt.  No neck rigidity. Respiratory: No rhonchi or crepitations. Cardiovascular: S1-S2 heard. Abdomen: Mild epigastric tenderness no guarding or rigidity. Musculoskeletal: No edema. Skin: No rash. Neurologic: Alert awake oriented time place and person.  Moves all extremities. Psychiatric: Appears normal.  Normal affect.   Labs on Admission: I have personally reviewed following labs and imaging studies  CBC: Recent Labs  Lab 12/30/23 1322  WBC 6.8  HGB 11.9*  HCT 38.6*  MCV 86.2  PLT 199   Basic Metabolic Panel: Recent Labs  Lab 12/30/23 1322  NA 137  K 4.3  CL 102  CO2 23  GLUCOSE 129*  BUN 12  CREATININE 0.74  CALCIUM  9.3   GFR: Estimated Creatinine Clearance: 101.9 mL/min (by C-G formula based on SCr of 0.74 mg/dL). Liver Function Tests: Recent Labs  Lab 12/30/23 2130  AST 524*  ALT 299*  ALKPHOS 154*  BILITOT 1.5*  PROT 7.0  ALBUMIN  4.3   Recent Labs  Lab 12/30/23 2130  LIPASE 65*   No results for input(s): AMMONIA in the last 168 hours. Coagulation Profile: Recent Labs  Lab 12/30/23 2325  INR 1.0   Cardiac Enzymes: No results for input(s): CKTOTAL, CKMB, CKMBINDEX,  TROPONINI in the last 168 hours. BNP (last 3 results) No results for input(s): PROBNP in the last 8760 hours. HbA1C: No results for input(s): HGBA1C in the last 72 hours. CBG: Recent Labs  Lab 12/31/23 0124  GLUCAP 168*   Lipid Profile: No results for input(s): CHOL, HDL, LDLCALC, TRIG, CHOLHDL, LDLDIRECT in the last 72 hours. Thyroid  Function Tests: No results for input(s): TSH, T4TOTAL, FREET4, T3FREE, THYROIDAB in the last 72 hours. Anemia Panel: No results for input(s): VITAMINB12, FOLATE, FERRITIN, TIBC, IRON, RETICCTPCT in the last 72 hours. Urine analysis:    Component Value Date/Time   COLORURINE STRAW (A) 11/08/2023 0540   APPEARANCEUR CLEAR 11/08/2023 0540   LABSPEC 1.004 (L) 11/08/2023 0540   PHURINE 6.0 11/08/2023 0540   GLUCOSEU NEGATIVE 11/08/2023 0540   HGBUR NEGATIVE 11/08/2023 0540   BILIRUBINUR NEGATIVE 11/08/2023 0540   BILIRUBINUR negative 07/24/2015 1131   BILIRUBINUR neg 02/13/2013 1400   KETONESUR NEGATIVE 11/08/2023 0540   PROTEINUR NEGATIVE 11/08/2023 0540   UROBILINOGEN 0.2 07/24/2015 1131   NITRITE NEGATIVE 11/08/2023 0540   LEUKOCYTESUR NEGATIVE 11/08/2023 0540   Sepsis Labs: @LABRCNTIP (procalcitonin:4,lacticidven:4) )No results found for this or any previous visit (from the past 240 hours).   Radiological Exams on Admission: US  Abdomen Limited RUQ (LIVER/GB) Result Date: 12/31/2023 CLINICAL DATA:  Right upper quadrant pain EXAM: ULTRASOUND ABDOMEN LIMITED RIGHT UPPER QUADRANT COMPARISON:  CT from the previous day. FINDINGS: Gallbladder: Gallbladder is incompletely distended accentuating the degree of wall thickening pericholecystic fluid is noted. No definitive gallstones are seen. Common bile duct: Diameter: 4 mm Liver: No focal lesion identified. Within normal limits in parenchymal echogenicity. Portal vein is patent on color Doppler imaging with normal direction of blood flow towards the liver. Other:  None. IMPRESSION: Gallbladder wall thickening and pericholecystic fluid suspicious for acute cholecystitis. No gallstones are seen. The overall appearance is similar to that seen on prior CT. Electronically Signed   By: Oneil Devonshire M.D.   On: 12/31/2023 03:28   CT ABDOMEN PELVIS W CONTRAST Result Date: 12/30/2023 CLINICAL DATA:  Epigastric pain. EXAM: CT ABDOMEN AND PELVIS WITH CONTRAST TECHNIQUE: Multidetector CT imaging of the abdomen and pelvis was performed using the standard protocol following bolus administration of intravenous contrast.  RADIATION DOSE REDUCTION: This exam was performed according to the departmental dose-optimization program which includes automated exposure control, adjustment of the mA and/or kV according to patient size and/or use of iterative reconstruction technique. CONTRAST:  OMNIPAQUE  IOHEXOL  300 MG/ML  SOLN COMPARISON:  None Available. FINDINGS: Lower chest: Multiple sternal wires are noted. No acute abnormality. Hepatobiliary: No focal liver abnormality is seen. Mild diffuse gallbladder wall thickening is seen without evidence of gallstones or pericholecystic inflammatory fat stranding. Pancreas: Unremarkable. No pancreatic ductal dilatation or surrounding inflammatory changes. Spleen: Normal in size without focal abnormality. Adrenals/Urinary Tract: Adrenal glands are unremarkable. Kidneys are normal in size, without renal calculi or hydronephrosis. A subcentimeter simple cyst is seen within the mid left kidney. Bladder is unremarkable. Stomach/Bowel: Stomach is within normal limits. The appendix is not clearly identified. No evidence of bowel wall thickening, distention, or inflammatory changes. Vascular/Lymphatic: No significant vascular findings are present. No enlarged abdominal or pelvic lymph nodes. Reproductive: The prostate gland is mildly enlarged. Other: No abdominal wall hernia or abnormality. No abdominopelvic ascites. Musculoskeletal: No acute or significant  osseous findings. IMPRESSION: 1. Mild diffuse gallbladder wall thickening without evidence of gallstones or pericholecystic inflammatory fat stranding. Further evaluation with a right upper quadrant ultrasound is recommended if acute cholecystitis is of clinical concern. 2. Mildly enlarged prostate gland. Correlation with PSA levels is recommended. Electronically Signed   By: Suzen Dials M.D.   On: 12/30/2023 22:17   DG Chest 1 View Result Date: 12/30/2023 CLINICAL DATA:  Acute epigastric and chest pain. EXAM: CHEST  1 VIEW COMPARISON:  December 20, 2023. FINDINGS: The heart size and mediastinal contours are within normal limits. Status post coronary artery bypass graft. Right lung is clear. Elevated left hemidiaphragm is noted with minimal left pleural effusion. The visualized skeletal structures are unremarkable. IMPRESSION: Elevated left hemidiaphragm with minimal left pleural effusion. Electronically Signed   By: Lynwood Landy Raddle M.D.   On: 12/30/2023 13:41    EKG: Independently reviewed.  Sinus rhythm.  Assessment/Plan Principal Problem:   Acute cholecystitis Active Problems:   Type 2 diabetes mellitus with hyperglycemia, without long-term current use of insulin  (HCC)   Hyperlipidemia   S/P CABG x 2   Elevated liver enzymes   Abdominal pain    Abdominal pain with elevated LFTs with ultrasound showing features concerning for acute cholecystitis -    will keep patient n.p.o. and on IV antibiotics.  General surgery has been consulted.  Acute hepatitis panel pending. Diabetes mellitus type 2 last hemoglobin A1c was 7.6 a month ago.  Patient takes Ozempic  and Jardiance  and metformin  at home.  Will keep patient on sliding scale coverage. CAD status post CABG in June 2025.  Takes statin Zetia  aspirin  Plavix  and metoprolol  presently NPO.  Holding statins statins for now given elevated LFTs. Anemia appears to be chronic follow CBC.  Since patient has elevated LFTs with concern for possible  cholecystitis will need further management and more than 2 midnight stay.   DVT prophylaxis: SCDs. Code Status: Full code. Family Communication: Discussed with patient. Disposition Plan: Medical floor. Consults called: General Surgery. Admission status: Inpatient.

## 2023-12-31 NOTE — Progress Notes (Addendum)
 This RN received report from RN Clarita at [facility] and is ready to receive the PT to 5N. Charge Nurse is aware of incoming Pt.    Ultrasound tech at bedside to perform scan. Admitting MD is aware of PT's arrival. RN is waiting for orders. Pt placed on Tele Pt is settled in. No acute distress noted at this time.

## 2024-01-01 ENCOUNTER — Encounter (HOSPITAL_COMMUNITY): Payer: Self-pay | Admitting: Anesthesiology

## 2024-01-01 ENCOUNTER — Inpatient Hospital Stay (HOSPITAL_COMMUNITY)

## 2024-01-01 ENCOUNTER — Encounter (HOSPITAL_COMMUNITY)
Admission: EM | Disposition: A | Discharge status: Home / Self Care | Payer: Self-pay | Source: Home / Self Care | Attending: Emergency Medicine

## 2024-01-01 DIAGNOSIS — R7989 Other specified abnormal findings of blood chemistry: Secondary | ICD-10-CM | POA: Diagnosis not present

## 2024-01-01 DIAGNOSIS — R1013 Epigastric pain: Secondary | ICD-10-CM

## 2024-01-01 DIAGNOSIS — K810 Acute cholecystitis: Secondary | ICD-10-CM | POA: Diagnosis not present

## 2024-01-01 LAB — HEMOGLOBIN A1C
Hgb A1c MFr Bld: 7 % — ABNORMAL HIGH (ref 4.8–5.6)
Mean Plasma Glucose: 154.2 mg/dL

## 2024-01-01 LAB — MAGNESIUM: Magnesium: 2 mg/dL (ref 1.7–2.4)

## 2024-01-01 LAB — COMPREHENSIVE METABOLIC PANEL WITH GFR
ALT: 374 U/L — ABNORMAL HIGH (ref 0–44)
AST: 232 U/L — ABNORMAL HIGH (ref 15–41)
Albumin: 3.5 g/dL (ref 3.5–5.0)
Alkaline Phosphatase: 147 U/L — ABNORMAL HIGH (ref 38–126)
Anion gap: 9 (ref 5–15)
BUN: 13 mg/dL (ref 6–20)
CO2: 22 mmol/L (ref 22–32)
Calcium: 9.1 mg/dL (ref 8.9–10.3)
Chloride: 107 mmol/L (ref 98–111)
Creatinine, Ser: 0.69 mg/dL (ref 0.61–1.24)
GFR, Estimated: >60 mL/min (ref >60)
Glucose, Bld: 130 mg/dL — ABNORMAL HIGH (ref 70–99)
Potassium: 4.1 mmol/L (ref 3.5–5.1)
Sodium: 138 mmol/L (ref 135–145)
Total Bilirubin: 0.9 mg/dL (ref 0.0–1.2)
Total Protein: 6.3 g/dL — ABNORMAL LOW (ref 6.5–8.1)

## 2024-01-01 LAB — GLUCOSE, CAPILLARY
Glucose-Capillary: 111 mg/dL — ABNORMAL HIGH (ref 70–99)
Glucose-Capillary: 123 mg/dL — ABNORMAL HIGH (ref 70–99)
Glucose-Capillary: 127 mg/dL — ABNORMAL HIGH (ref 70–99)
Glucose-Capillary: 138 mg/dL — ABNORMAL HIGH (ref 70–99)
Glucose-Capillary: 142 mg/dL — ABNORMAL HIGH (ref 70–99)
Glucose-Capillary: 155 mg/dL — ABNORMAL HIGH (ref 70–99)
Glucose-Capillary: 203 mg/dL — ABNORMAL HIGH (ref 70–99)

## 2024-01-01 LAB — CBC
HCT: 36.4 % — ABNORMAL LOW (ref 39.0–52.0)
Hemoglobin: 11.5 g/dL — ABNORMAL LOW (ref 13.0–17.0)
MCH: 26.5 pg (ref 26.0–34.0)
MCHC: 31.6 g/dL (ref 30.0–36.0)
MCV: 83.9 fL (ref 80.0–100.0)
Platelets: 167 K/uL (ref 150–400)
RBC: 4.34 MIL/uL (ref 4.22–5.81)
RDW: 13.5 % (ref 11.5–15.5)
WBC: 2.4 K/uL — ABNORMAL LOW (ref 4.0–10.5)
nRBC: 0 % (ref 0.0–0.2)

## 2024-01-01 LAB — PHOSPHORUS: Phosphorus: 4.6 mg/dL (ref 2.5–4.6)

## 2024-01-01 LAB — APTT: aPTT: 23 s — ABNORMAL LOW (ref 24–36)

## 2024-01-01 LAB — PROTIME-INR
INR: 1.1 (ref 0.8–1.2)
Prothrombin Time: 14.4 s (ref 11.4–15.2)

## 2024-01-01 SURGERY — LAPAROSCOPIC CHOLECYSTECTOMY
Anesthesia: General

## 2024-01-01 MED ORDER — LACTATED RINGERS IV SOLN: INTRAVENOUS | Status: DC

## 2024-01-01 MED ORDER — TECHNETIUM TC 99M MEBROFENIN IV KIT
5.4000 | PACK | Freq: Once | INTRAVENOUS | Status: AC | PRN
  Administered 2024-01-01: 5.4 via INTRAVENOUS

## 2024-01-01 MED ORDER — BUPIVACAINE-EPINEPHRINE (PF) 0.25% -1:200000 IJ SOLN
INTRAMUSCULAR | Status: AC
  Filled 2024-01-01: qty 30

## 2024-01-01 NOTE — Consult Note (Addendum)
 Consultation  Referring Provider: TRH/Sira Primary Care Physician:  Levora Reyes SAUNDERS, MD Primary Gastroenterologist:  Dr. Legrand  Reason for Consultation: Acute severe epigastric pain and elevated LFTs  HPI: Marcus Hart is a 58 y.o. male with history of diabetes mellitus, hyperlipidemia, hypertension and coronary artery disease who is status post CABG June 2025 after ST EMI He is on aspirin  and Plavix . Patient presented to the emergency room at [facility]  on the evening of 12/30/2023 with complaint of acute epigastric pain.  Apparently he had come to the emergency room at [facility] that same day related to 10 out of 10 pain and had initial cardiac evaluation which were unremarkable.  He left being for being seen by a provider.  Patient related that with initial onset the pain was 10 out of 10, without radiation to the back or chest, no nausea or vomiting, thinks he had a chill but did not have any fever.  The pain lasted for total of 5 to 6 hours and then gradually decreased to a level of 4-5/10 when he was seen at [facility].  He and his wife also stated that he had a lot of belching and burping with this episode which is very unusual for him. He underwent CT of the abdomen and pelvis with contrast that showed multiple sternal wires, no focal liver abnormality, mild diffuse gallbladder wall thickening without evidence of gallstones normal-appearing stomach and mildly enlarged prostate.  He was admitted here and then underwent ultrasound yesterday which shows incompletely distended gallbladder accentuating the degree of wall thickening and pericholecystic fluid no definite gallstones, CBD 4 mm no liver lesions and portal vein patent.  Initial labs on 12/30/2023 showed T. bili 1.5/alk phos 154/AST 524/ALT 299/lipase 65 WBC 6.8/hemoglobin 11.9/hematocrit 38.6/platelets 199  Acute hepatitis panel negative, acetaminophen  negative, EtOH negative  Yesterday 8/15-T. Bili 1.2/alk  phos 169/AST 1041/ALT 625 WBC 2.8/hemoglobin 11.8/hematocrit 36.8/platelets 179 T. bili 0.9/alk phos 147/AST 232/ALT 374 INR 1.1/pro time 14.4  HIDA scan today does show filling of the gallbladder, no evidence for acute cholecystitis  Patient says he feels better since yesterday and has not had any degree of abdominal pain, he has been able to eat denies nausea.  No fever  He and his wife are concerned about medications causing problems.  He had been on Crestor  previously but dose was increased on 12/20/2023 from 5 mg/day to 40 mg/day and he also was just started on Zetia  at that same time for risk modification.  He has not had any prior history of liver issues.  He is on Plavix  and aspirin  here.   Past Medical History:  Diagnosis Date   Diabetes mellitus (HCC)    Diabetes mellitus without complication (HCC)    Phreesia 11/10/2019   Hyperlipidemia    STEMI (ST elevation myocardial infarction) [facility])     Past Surgical History:  Procedure Laterality Date   COLONOSCOPY     CORONARY ARTERY BYPASS GRAFT N/A 11/08/2023   Procedure: CORONARY ARTERY BYPASS GRAFTING X TWO USING LEFT INTERNAL MAMMARY ARTERY AND LEFT RADIAL ARTERY;  Surgeon: Kerrin Elspeth BROCKS, MD;  Location: [facility] OR;  Service: Open Heart Surgery;  Laterality: N/A;   CORONARY PRESSURE/FFR STUDY N/A 11/04/2023   Procedure: CORONARY PRESSURE/FFR STUDY;  Surgeon: Elmira Newman PARAS, MD;  Location: [facility] INVASIVE CV LAB;  Service: Cardiovascular;  Laterality: N/A;   CORONARY ULTRASOUND/IVUS N/A 11/04/2023   Procedure: Coronary Ultrasound/IVUS;  Surgeon: Elmira Newman PARAS, MD;  Location: [facility] INVASIVE CV LAB;  Service: Cardiovascular;  Laterality: N/A;   INTRAOPERATIVE TRANSESOPHAGEAL ECHOCARDIOGRAM N/A 11/08/2023   Procedure: ECHOCARDIOGRAM, TRANSESOPHAGEAL, INTRAOPERATIVE;  Surgeon: Kerrin Elspeth BROCKS, MD;  Location: [facility] OR;  Service: Open Heart Surgery;  Laterality: N/A;   LEFT HEART CATH AND CORONARY ANGIOGRAPHY N/A 11/04/2023    Procedure: LEFT HEART CATH AND CORONARY ANGIOGRAPHY;  Surgeon: Elmira Newman PARAS, MD;  Location: [facility] INVASIVE CV LAB;  Service: Cardiovascular;  Laterality: N/A;   NO PAST SURGERIES     RADIAL ARTERY HARVEST Left 11/08/2023   Procedure: SURGICAL PROCUREMENT, ARTERY, RADIAL;  Surgeon: Kerrin Elspeth BROCKS, MD;  Location: [facility] OR;  Service: Open Heart Surgery;  Laterality: Left;   UPPER GASTROINTESTINAL ENDOSCOPY      Prior to Admission medications   Medication Sig Start Date End Date Taking? Authorizing Provider  acetaminophen  (TYLENOL ) 325 MG tablet Take 2 tablets (650 mg total) by mouth every 6 (six) hours as needed for mild pain (pain score 1-3). Patient taking differently: Take 325 mg by mouth daily as needed for mild pain (pain score 1-3) or moderate pain (pain score 4-6). 11/13/23  Yes Raguel Con RAMAN, PA-C  amLODipine  (NORVASC ) 5 MG tablet Take 1 tablet (5 mg total) by mouth daily. 11/13/23  Yes Raguel Con RAMAN, PA-C  aspirin  EC 81 MG tablet Take 81 mg by mouth daily. Swallow whole.   Yes [provider]  clopidogrel  (PLAVIX ) 75 MG tablet Take 1 tablet (75 mg total) by mouth daily. 12/10/23  Yes Williams, Evan, PA-C  Dulaglutide  (TRULICITY ) 3 MG/0.5ML SOAJ Inject 3 mg into the skin once a week. Patient taking differently: Inject 3 mg into the skin once a week. Fridays 10/08/23  Yes Levora Reyes SAUNDERS, MD  empagliflozin  (JARDIANCE ) 10 MG TABS tablet Take 1 tablet (10 mg total) by mouth daily. 11/17/23  Yes Tabori, Katherine E, MD  ezetimibe  (ZETIA ) 10 MG tablet Take 1 tablet (10 mg total) by mouth daily. 12/22/23 03/26/24 Yes Williams, Evan, PA-C  metFORMIN  (GLUCOPHAGE ) 1000 MG tablet Take 1 tablet (1,000 mg total) by mouth 2 (two) times daily with a meal. 12/16/23  Yes Levora Reyes SAUNDERS, MD  Multiple Vitamins-Minerals (MULTIVITAMIN WITH MINERALS) tablet Take 1 tablet by mouth daily.   Yes [provider]  rosuvastatin  (CRESTOR ) 40 MG tablet Take 1 tablet (40 mg total) by mouth  daily. Patient taking differently: Take 40 mg by mouth at bedtime. 12/10/23  Yes Trudy Birmingham, PA-C  Study - SOS-AMI - selatogrel 16 mg/0.5 mL or placebo SQ injection (PI-Christopher) Inject 0.5 mLs (16 mg total) into the skin as needed (As needed for for symptoms of a heart attack (chest pain).). 12/29/23  Yes Lonni Slain, MD  furosemide  (LASIX ) 40 MG tablet Take 1 tablet (40 mg total) by mouth daily for 3 days then take as directed by physician. Patient not taking: Reported on 12/31/2023 11/29/23   Trudy Birmingham, PA-C  Insulin  Pen Needle (PEN NEEDLES) 32G X 4 MM MISC Use with Victoza  to inject once daily 06/30/21   Levora Reyes SAUNDERS, MD  Lancets [facility] DELICA PLUS [employer]) MISC USE AS DIRECTED TO TEST BLOOD GLUCOSE 11/03/21   Levora Reyes SAUNDERS, MD  nitroGLYCERIN  (NITROSTAT ) 0.4 MG SL tablet Place 1 tablet (0.4 mg total) under the tongue every 5 (five) minutes as needed for chest pain. If you need more than 3 doses in 24 hours, call 911 or go to nearest ER Patient not taking: Reported on 12/31/2023 12/29/23   Wonda Sharper, MD  oxyCODONE  (OXY IR/ROXICODONE ) 5 MG immediate  release tablet Take 1 tablet (5 mg total) by mouth every 6 (six) hours as needed for severe pain (pain score 7-10). Patient not taking: Reported on 12/31/2023 11/13/23   Raguel Con RAMAN, PA-C  potassium chloride  (KLOR-CON ) 10 MEQ tablet Take 1 tablet (10 mEq total) by mouth daily for 3 days then take as directed by physician. Patient not taking: Reported on 12/31/2023 11/29/23   Trudy Birmingham, PA-C  predniSONE  (DELTASONE ) 20 MG tablet Take 40 mg by mouth daily. Patient not taking: Reported on 12/31/2023    [provider]    Current Facility-Administered Medications  Medication Dose Route Frequency Provider Last Rate Last Admin   aspirin  EC tablet 81 mg  81 mg Oral Daily Sira, Zackery, MD   81 mg at 01/01/24 1448   clopidogrel  (PLAVIX ) tablet 75 mg  75 mg Oral Daily Sira, Zackery, MD   75 mg at 01/01/24  1448   [START ON 01/02/2024] empagliflozin  (JARDIANCE ) tablet 10 mg  10 mg Oral Daily Sira, Zackery, MD       ezetimibe  (ZETIA ) tablet 10 mg  10 mg Oral Daily Sira, Zackery, MD       insulin  aspart (novoLOG ) injection 0-6 Units  0-6 Units Subcutaneous Q4H Franky Redia SAILOR, MD   2 Units at 12/31/23 2129   lactated ringers  infusion   Intravenous Continuous Sira, Zackery, MD        Allergies as of 12/30/2023 - Review Complete 12/30/2023  Allergen Reaction Noted   Pork-derived products Other (See Comments) 11/09/2023    Family History  Problem Relation Age of Onset   Diabetes Mother    Bladder Cancer Mother    Hypertension Mother    Ulcers Father    Benign prostatic hyperplasia Father    Colon cancer Neg Hx    Colon polyps Neg Hx    Esophageal cancer Neg Hx    Rectal cancer Neg Hx    Stomach cancer Neg Hx     Social History   Socioeconomic History   Marital status: Married    Spouse name: [name]   Number of children: 4   Years of education: Not on file   Highest education level: Bachelor's degree (e.g., BA, AB, BS)  Occupational History   Occupation: Designer, television/film set  Tobacco Use   Smoking status: Never   Smokeless tobacco: Never  Vaping Use   Vaping status: Never Used  Substance and Sexual Activity   Alcohol use: Never    Alcohol/week: 0.0 standard drinks of alcohol   Drug use: No   Sexual activity: Yes  Other Topics Concern   Not on file  Social History Narrative   Married   Education: Automotive engineer   Exercise: No   Social Drivers of Corporate investment banker Strain: Medium Risk (06/18/2023)   Overall Financial Resource Strain (CARDIA)    Difficulty of Paying Living Expenses: Somewhat hard  Food Insecurity: No Food Insecurity (12/31/2023)   Hunger Vital Sign    Worried About Running Out of Food in the Last Year: Never true    Ran Out of Food in the Last Year: Never true  Recent Concern: Food Insecurity - Food Insecurity Present (11/06/2023)   Hunger Vital Sign     Worried About Running Out of Food in the Last Year: Sometimes true    Ran Out of Food in the Last Year: Sometimes true  Transportation Needs: No Transportation Needs (12/31/2023)   PRAPARE - Administrator, Civil Service (Medical): No  Lack of Transportation (Non-Medical): No  Physical Activity: Insufficiently Active (06/18/2023)   Exercise Vital Sign    Days of Exercise per Week: 1 day    Minutes of Exercise per Session: 30 min  Stress: No Stress Concern Present (06/18/2023)   [employer] of Occupational Health - Occupational Stress Questionnaire    Feeling of Stress : Not at all  Social Connections: Socially Integrated (12/31/2023)   Social Connection and Isolation Panel    Frequency of Communication with Friends and Family: Twice a week    Frequency of Social Gatherings with Friends and Family: Twice a week    Attends Religious Services: 1 to 4 times per year    Active Member of [employer] or Organizations: Yes    Attends Banker Meetings: 1 to 4 times per year    Marital Status: Married  Catering manager Violence: Not At Risk (12/31/2023)   Humiliation, Afraid, Rape, and Kick questionnaire    Fear of Current or Ex-Partner: No    Emotionally Abused: No    Physically Abused: No    Sexually Abused: No    Review of Systems: Pertinent positive and negative review of systems were noted in the above HPI section.  All other review of systems was otherwise negative.   Physical Exam: Vital signs in last 24 hours: Temp:  [97.9 F (36.6 C)-98.9 F (37.2 C)] 98.6 F (37 C) (08/16 1509) Pulse Rate:  [62-83] 83 (08/16 1509) Resp:  [15-17] 17 (08/16 1509) BP: (97-118)/(65-77) 118/73 (08/16 1509) SpO2:  [100 %] 100 % (08/16 1509) Last BM Date : 12/30/23 General:   Alert,  Well-developed, older male pleasant and cooperative in NAD, wife at bedside Head:  Normocephalic and atraumatic. Eyes:  Sclera clear, no icterus.   Conjunctiva pink. Ears:  Normal auditory  acuity. Nose:  No deformity, discharge,  or lesions. Mouth:  No deformity or lesions.   Neck:  Supple; no masses or thyromegaly. Lungs:  Clear throughout to auscultation.   No wheezes, crackles, or rhonchi.  Sternal incisional scars healing  heart:  Regular rate and rhythm; no murmurs, clicks, rubs,  or gallops. Abdomen:  Soft, mildly tender with deep palpation in the epigastrium no guarding or rebound BS active,nonpalp mass or hsm.   Rectal:  Deferred  Msk:  Symmetrical without gross deformities. . Pulses:  Normal pulses noted. Extremities:  Without clubbing or edema. Neurologic:  Alert and  oriented x4;  grossly normal neurologically. Skin:  Intact without significant lesions or rashes.. Psych:  Alert and cooperative. Normal mood and affect.  Intake/Output from previous day: 08/15 0701 - 08/16 0700 In: 806.2 [P.O.:240; I.V.:517.9; IV Piggyback:48.2] Out: -  Intake/Output this shift: No intake/output data recorded.  Lab Results: Recent Labs    12/30/23 1322 12/31/23 0644 01/01/24 0752  WBC 6.8 2.8* 2.4*  HGB 11.9* 11.8* 11.5*  HCT 38.6* 36.8* 36.4*  PLT 199 179 167   BMET Recent Labs    12/30/23 1322 12/31/23 0644 01/01/24 0752  NA 137 136 138  K 4.3 4.0 4.1  CL 102 106 107  CO2 23 21* 22  GLUCOSE 129* 157* 130*  BUN 12 13 13   CREATININE 0.74 0.61 0.69  CALCIUM  9.3 9.0 9.1   LFT Recent Labs    12/31/23 0644 01/01/24 0752  PROT 6.4* 6.3*  ALBUMIN  3.5 3.5  AST 1,041* 232*  ALT 625* 374*  ALKPHOS 169* 147*  BILITOT 1.2 0.9  BILIDIR 0.7*  --   IBILI 0.5  --  PT/INR Recent Labs    12/30/23 2325 01/01/24 0752  LABPROT 13.2 14.4  INR 1.0 1.1   Hepatitis Panel Recent Labs    12/31/23 2124  HEPBSAG NON REACTIVE  HCVAB NON REACTIVE  HEPAIGM NON REACTIVE  HEPBIGM NON REACTIVE    IMPRESSION:  #61 58 year old male with coronary artery disease status post STE MI 10/2023 now status post CABG 10/2023 On aspirin  and Plavix   #2 diabetes mellitus #3.   Hyperlipidemia  #4 acute episode of severe epigastric pain onset 12/30/2023 lasting 7 to 8 hours and rated as a 10 out of 10 per patient no associated nausea or vomiting.  By the time he was transferred here to the ER pain was 5 out of 10 and then gradually resolved.  He is not having any pain today. Initial workup at [facility] ER concerning for acute cholecystitis with CT scan showing gallbladder wall thickening. Also with elevated LFTs.  Ultrasound yesterday with incompletely distended gallbladder accentuating the degree of pericholecystic fluid and wall thickening still concerning for cholecystitis, CBD normal  HIDA scan today negative  Patient had a significant bump in his LFTs yesterday, improved today Patient pain-free today though minimally tender on deep palpation in the epigastrium  Etiology for this acute episode and associated significant transaminitis not clear at present.  Unusual for DILI to cause severe pain, still concerned this was a biliary episode. Negative acute hep panel  Plan; MRI/MRCP Continue to trend LFTs Patient is declining his Crestor  and Zetia  today-okay to hold Diet as tolerates  Other GI recommendations pending MRI/MRCP. (Patient currently still on aspirin  and Plavix )    Amy Esterwood  PA-C 01/01/2024, 3:57 PM     Attending physician's note   I have taken history, reviewed the chart and examined the patient. I performed a substantive portion of this encounter, including complete performance of at least one of the key components, in conjunction with the APP. I agree with the Advanced Practitioner's note, impression and recommendations.   New onset Abn LFTs with abdo pain- now getting better. ?etiology. US  with ?cholecystitis, but HIDA is neg. Agree with Sx that above is not consistent with acute cholecystitis. Could be DILI-Zetia , high-dose Crestor , doubt ischemic. Neg acute hepatitis panel, neg Tylenol  level.  No alcohol.  No further abdominal  pain.  CAD s/p STEMI reg recent CABG 10/2023 on ASA/Plavix   Associated uncontrolled DM (hemoglobin A1c 7), HTN, HLD  Plan: - To complete workup, proceed with MRCP - Trend LFTs. If LFTs better and above is neg, D/C home in AM with GI FU - No need for liver Bx at this time. - Avoid hepatotoxic medications including Crestor /Zetia . - D/W pt and wife.   Wife is a physician who went to med school in [place]. Unable to practice here yet.  Anselm Bring, MD [facility] [phone]

## 2024-01-01 NOTE — Progress Notes (Signed)
 Assessment & Plan: HD#2 - abdominal pain, elevated liver enzymes - NPO for HIDA this AM, possible OR - HIDA scan pending this AM  Discussed with patient and family at bedside.  Await HIDA scan this AM.  Dr. Lyndel to decide on operative intervention based on results.        Krystal Spinner, MD [facility] practice Office: [phone]        Chief Complaint: Abdominal pain  Subjective: Patient in bed, comfortable.  Wife at bedside.  Objective: Vital signs in last 24 hours: Temp:  [97.9 F (36.6 C)-98.9 F (37.2 C)] 97.9 F (36.6 C) (08/16 0731) Pulse Rate:  [62-80] 62 (08/16 0731) Resp:  [15-18] 15 (08/16 0731) BP: (97-118)/(65-77) 118/72 (08/16 0731) SpO2:  [100 %] 100 % (08/16 0731) Last BM Date : 12/30/23  Intake/Output from previous day: 08/15 0701 - 08/16 0700 In: 806.2 [P.O.:240; I.V.:517.9; IV Piggyback:48.2] Out: -  Intake/Output this shift: No intake/output data recorded.  Physical Exam: HEENT - sclerae clear, mucous membranes moist Abdomen - soft without distension; non-tender; no mass Ext - no edema, non-tender  Lab Results:  Recent Labs    12/31/23 0644 01/01/24 0752  WBC 2.8* 2.4*  HGB 11.8* 11.5*  HCT 36.8* 36.4*  PLT 179 167   BMET Recent Labs    12/31/23 0644 01/01/24 0752  NA 136 138  K 4.0 4.1  CL 106 107  CO2 21* 22  GLUCOSE 157* 130*  BUN 13 13  CREATININE 0.61 0.69  CALCIUM  9.0 9.1   PT/INR Recent Labs    12/30/23 2325 01/01/24 0752  LABPROT 13.2 14.4  INR 1.0 1.1   Comprehensive Metabolic Panel:    Component Value Date/Time   NA 138 01/01/2024 0752   NA 136 12/31/2023 0644   NA 138 03/15/2020 1635   NA 140 12/14/2019 1619   K 4.1 01/01/2024 0752   K 4.0 12/31/2023 0644   CL 107 01/01/2024 0752   CL 106 12/31/2023 0644   CO2 22 01/01/2024 0752   CO2 21 (L) 12/31/2023 0644   BUN 13 01/01/2024 0752   BUN 13 12/31/2023 0644   BUN 14 03/15/2020 1635   BUN 11 12/14/2019 1619    CREATININE 0.69 01/01/2024 0752   CREATININE 0.61 12/31/2023 0644   CREATININE 0.78 10/31/2015 0918   CREATININE 0.85 04/06/2015 1119   GLUCOSE 130 (H) 01/01/2024 0752   GLUCOSE 157 (H) 12/31/2023 0644   CALCIUM  9.1 01/01/2024 0752   CALCIUM  9.0 12/31/2023 0644   AST 232 (H) 01/01/2024 0752   AST 1,041 (H) 12/31/2023 0644   ALT 374 (H) 01/01/2024 0752   ALT 625 (H) 12/31/2023 0644   ALKPHOS 147 (H) 01/01/2024 0752   ALKPHOS 169 (H) 12/31/2023 0644   BILITOT 0.9 01/01/2024 0752   BILITOT 1.2 12/31/2023 0644   BILITOT 0.3 12/20/2023 0844   BILITOT 0.3 03/15/2020 1635   PROT 6.3 (L) 01/01/2024 0752   PROT 6.4 (L) 12/31/2023 0644   PROT 7.1 12/20/2023 0844   PROT 7.2 03/15/2020 1635   ALBUMIN  3.5 01/01/2024 0752   ALBUMIN  3.5 12/31/2023 0644   ALBUMIN  4.4 12/20/2023 0844   ALBUMIN  4.6 03/15/2020 1635    Studies/Results: US  Abdomen Limited RUQ (LIVER/GB) Result Date: 12/31/2023 CLINICAL DATA:  Right upper quadrant pain EXAM: ULTRASOUND ABDOMEN LIMITED RIGHT UPPER QUADRANT COMPARISON:  CT from the previous day. FINDINGS: Gallbladder: Gallbladder is incompletely distended accentuating the degree of wall thickening pericholecystic fluid is noted. No definitive  gallstones are seen. Common bile duct: Diameter: 4 mm Liver: No focal lesion identified. Within normal limits in parenchymal echogenicity. Portal vein is patent on color Doppler imaging with normal direction of blood flow towards the liver. Other: None. IMPRESSION: Gallbladder wall thickening and pericholecystic fluid suspicious for acute cholecystitis. No gallstones are seen. The overall appearance is similar to that seen on prior CT. Electronically Signed   By: Oneil Devonshire M.D.   On: 12/31/2023 03:28   CT ABDOMEN PELVIS W CONTRAST Result Date: 12/30/2023 CLINICAL DATA:  Epigastric pain. EXAM: CT ABDOMEN AND PELVIS WITH CONTRAST TECHNIQUE: Multidetector CT imaging of the abdomen and pelvis was performed using the standard protocol  following bolus administration of intravenous contrast. RADIATION DOSE REDUCTION: This exam was performed according to the departmental dose-optimization program which includes automated exposure control, adjustment of the mA and/or kV according to patient size and/or use of iterative reconstruction technique. CONTRAST:  OMNIPAQUE  IOHEXOL  300 MG/ML  SOLN COMPARISON:  None Available. FINDINGS: Lower chest: Multiple sternal wires are noted. No acute abnormality. Hepatobiliary: No focal liver abnormality is seen. Mild diffuse gallbladder wall thickening is seen without evidence of gallstones or pericholecystic inflammatory fat stranding. Pancreas: Unremarkable. No pancreatic ductal dilatation or surrounding inflammatory changes. Spleen: Normal in size without focal abnormality. Adrenals/Urinary Tract: Adrenal glands are unremarkable. Kidneys are normal in size, without renal calculi or hydronephrosis. A subcentimeter simple cyst is seen within the mid left kidney. Bladder is unremarkable. Stomach/Bowel: Stomach is within normal limits. The appendix is not clearly identified. No evidence of bowel wall thickening, distention, or inflammatory changes. Vascular/Lymphatic: No significant vascular findings are present. No enlarged abdominal or pelvic lymph nodes. Reproductive: The prostate gland is mildly enlarged. Other: No abdominal wall hernia or abnormality. No abdominopelvic ascites. Musculoskeletal: No acute or significant osseous findings. IMPRESSION: 1. Mild diffuse gallbladder wall thickening without evidence of gallstones or pericholecystic inflammatory fat stranding. Further evaluation with a right upper quadrant ultrasound is recommended if acute cholecystitis is of clinical concern. 2. Mildly enlarged prostate gland. Correlation with PSA levels is recommended. Electronically Signed   By: Suzen Dials M.D.   On: 12/30/2023 22:17   DG Chest 1 View Result Date: 12/30/2023 CLINICAL DATA:  Acute  epigastric and chest pain. EXAM: CHEST  1 VIEW COMPARISON:  December 20, 2023. FINDINGS: The heart size and mediastinal contours are within normal limits. Status post coronary artery bypass graft. Right lung is clear. Elevated left hemidiaphragm is noted with minimal left pleural effusion. The visualized skeletal structures are unremarkable. IMPRESSION: Elevated left hemidiaphragm with minimal left pleural effusion. Electronically Signed   By: Lynwood Landy Raddle M.D.   On: 12/30/2023 13:41      Krystal Spinner 01/01/2024  Patient ID: Marcus Hart, male   DOB: [birth year], 58 y.o.   MRN: [id number]

## 2024-01-01 NOTE — Progress Notes (Signed)
 HIDA scan is negative for cholecystitis.  The patient can have a regular diet and follow up with me in the office.  I'm unsure the etiology of his transaminitis or the appearance of his gallbladder on imaging, but it does not appear he will require cholecystectomy this admission.  Please call surgery team if needed.   Deward JINNY Foy, MD General, Bariatric and Minimally Invasive Surgery [facility]

## 2024-01-01 NOTE — Progress Notes (Signed)
  Progress Note   Patient: Marcus Hart Dock FMW:[id number] DOB: [birth year] DOA: 12/30/2023     1 DOS: the patient was seen and examined on 01/01/2024    Assessment and Plan: Abdominal pain with deranged LFTs - HIDA is negative --> No acute cholecystitis  - Acute hepatitis panel is negative  - Regular diet  - LFTs improving but remain elevated --> [facility] consulted 01/01/24 - IV LR 75 cc/hr   DM2 (A1c 7.0) - Novolog  SS q4 hr   CAD s/p CABG  - ASA 81 mg PO daily - Plavix  75 mg PO daily  - Zetia  10 mg PO daily  - Jardiance  10 mg PO daily   Anemia  - Monitor   Subjective: Pt seen and examined at the bedside. HIDA is negative. Diet advanced to regular. GI consulted as pt's LFTs remain elevated but are improving.   Physical Exam: Vitals:   12/31/23 1550 12/31/23 2057 01/01/24 0432 01/01/24 0731  BP: 114/74 97/65 109/77 118/72  Pulse: 76 80 70 62  Resp: 18 16 17 15   Temp: 98.3 F (36.8 C) 98.9 F (37.2 C) 98 F (36.7 C) 97.9 F (36.6 C)  TempSrc:  Oral Oral Oral  SpO2: 100% 100% 100% 100%  Weight:      Height:       Physical Exam HENT:     Head: Normocephalic.  Cardiovascular:     Rate and Rhythm: Normal rate.  Pulmonary:     Effort: Pulmonary effort is normal.  Abdominal:     General: Abdomen is flat.     Palpations: Abdomen is soft.  Neurological:     Mental Status: He is alert.      Disposition: Status is: Inpatient Remains inpatient appropriate because: IV fluids, serial labs and GI consult   Planned Discharge Destination: Home    Time spent: 35 minutes  Author: Amahd Morino , MD 01/01/2024 2:59 PM  For on call review [email or web address].

## 2024-01-01 NOTE — Plan of Care (Signed)
  Problem: Safety: Goal: Ability to remain free from injury will improve Outcome: Progressing   Problem: Metabolic: Goal: Ability to maintain appropriate glucose levels will improve Outcome: Progressing   Problem: Skin Integrity: Goal: Risk for impaired skin integrity will decrease Outcome: Progressing

## 2024-01-02 ENCOUNTER — Inpatient Hospital Stay (HOSPITAL_COMMUNITY)

## 2024-01-02 DIAGNOSIS — K810 Acute cholecystitis: Secondary | ICD-10-CM | POA: Diagnosis not present

## 2024-01-02 LAB — COMPREHENSIVE METABOLIC PANEL WITH GFR
ALT: 235 U/L — ABNORMAL HIGH (ref 0–44)
AST: 81 U/L — ABNORMAL HIGH (ref 15–41)
Albumin: 3.4 g/dL — ABNORMAL LOW (ref 3.5–5.0)
Alkaline Phosphatase: 124 U/L (ref 38–126)
Anion gap: 11 (ref 5–15)
BUN: 12 mg/dL (ref 6–20)
CO2: 22 mmol/L (ref 22–32)
Calcium: 9 mg/dL (ref 8.9–10.3)
Chloride: 104 mmol/L (ref 98–111)
Creatinine, Ser: 0.71 mg/dL (ref 0.61–1.24)
GFR, Estimated: >60 mL/min (ref >60)
Glucose, Bld: 174 mg/dL — ABNORMAL HIGH (ref 70–99)
Potassium: 4.1 mmol/L (ref 3.5–5.1)
Sodium: 137 mmol/L (ref 135–145)
Total Bilirubin: 0.8 mg/dL (ref 0.0–1.2)
Total Protein: 6.2 g/dL — ABNORMAL LOW (ref 6.5–8.1)

## 2024-01-02 LAB — CBC
HCT: 36.3 % — ABNORMAL LOW (ref 39.0–52.0)
Hemoglobin: 11.4 g/dL — ABNORMAL LOW (ref 13.0–17.0)
MCH: 26.5 pg (ref 26.0–34.0)
MCHC: 31.4 g/dL (ref 30.0–36.0)
MCV: 84.4 fL (ref 80.0–100.0)
Platelets: 169 K/uL (ref 150–400)
RBC: 4.3 MIL/uL (ref 4.22–5.81)
RDW: 13.6 % (ref 11.5–15.5)
WBC: 2.7 K/uL — ABNORMAL LOW (ref 4.0–10.5)
nRBC: 0 % (ref 0.0–0.2)

## 2024-01-02 LAB — GLUCOSE, CAPILLARY
Glucose-Capillary: 106 mg/dL — ABNORMAL HIGH (ref 70–99)
Glucose-Capillary: 149 mg/dL — ABNORMAL HIGH (ref 70–99)
Glucose-Capillary: 164 mg/dL — ABNORMAL HIGH (ref 70–99)

## 2024-01-02 LAB — PHOSPHORUS: Phosphorus: 4 mg/dL (ref 2.5–4.6)

## 2024-01-02 LAB — MAGNESIUM: Magnesium: 1.8 mg/dL (ref 1.7–2.4)

## 2024-01-02 LAB — LIPASE, BLOOD: Lipase: 42 U/L (ref 11–51)

## 2024-01-02 LAB — C-REACTIVE PROTEIN: CRP: 0.5 mg/dL (ref <1.0)

## 2024-01-02 MED ORDER — PREDNISONE 20 MG PO TABS
20.0000 mg | ORAL_TABLET | Freq: Every day | ORAL | 0 refills | Status: AC
  Filled 2024-01-02: qty 3, 3d supply, fill #0

## 2024-01-02 MED ORDER — GADOBUTROL 1 MMOL/ML IV SOLN
7.0000 mL | Freq: Once | INTRAVENOUS | Status: AC | PRN
  Administered 2024-01-02: 7 mL via INTRAVENOUS

## 2024-01-02 NOTE — Plan of Care (Signed)
   Problem: Safety: Goal: Ability to remain free from injury will improve Outcome: Progressing   Problem: Skin Integrity: Goal: Risk for impaired skin integrity will decrease Outcome: Progressing

## 2024-01-02 NOTE — Discharge Summary (Signed)
 Physician Discharge Summary  Hughes Wyndham [place] Baltazar FMW:[id number] DOB: 09-Dec-1965 DOA: 12/30/2023  PCP: Levora Reyes SAUNDERS, MD  Admit date: 12/30/2023 Discharge date: 01/02/2024  Time spent: 26 minutes  Recommendations for Outpatient Follow-up:  Chem-12 INR CBC in 2 weeks Likely requires outpatient management of lipids cautiously under guidance of either lipidologist or cardiology--- I will CC Dr. Wonda to make sure that he is aware of the situation  Discharge Diagnoses:  MAIN problem for hospitalization   DI LI Recent CAD with CABG  Please see below for itemized issues addressed in [facility]- refer to other progress notes for clarity if needed  Discharge Condition: Improved  Diet recommendation: Diabetic heart healthy  Filed Weights   12/30/23 2009  Weight: 74.4 kg    History of present illness:  58 year old [place] male now lives locally CABG X2 10/2023--discharge aspirin  and Plavix  DM TY 2 relatively well-controlled with A1c of 7 , HLD on Zetia  and increased dose of Crestor  since CABG for secondary prevention  8/15 admitted with nausea and vomiting-imaging?  Thickening gall bladder wall?  Acalculous cholecystitis HIDA scan eventually showed patent common bile duct and cystic duct no acute cholecystitis General surgery saw the patient and felt he could follow-up in the office if needed but no acute workup of this in the inpatient surgery no surgery As a result of elevated liver enzymes with negative hepatitis panel (peak AST 1041/ALT 625 bilirubin 1.5) GI was consulted who felt that this was DISI likely secondary to his diarrhea and elevated dose of Crestor  CRP was 0.5  He continued coverage during the hospital and we ultimately resumed his Jardiance  metformin  and Trulicity  at discharge and sugars were quite well-controlled predominantly below 180  Patient's LFTs gradually trended downwards to an AST of 81 and ALT of 235 bili of 0.8 He had met maximal hospital benefit  on 8/17 was eating and drinking looks stable no other issues no chest pain no fever and was felt to be able to go home  Addressed several other questions that he had with regards to gout of his left toe and gave him a small prescription of prednisone  for 3 days to take home in case he has an attack It may benefit him to follow-up with lipid clinic associated with cardiology for discussion about options for secondary prevention and I will CC Dr. Wonda   Discharge Exam: Vitals:   01/02/24 0429 01/02/24 0733  BP: 117/73 120/77  Pulse: 71 60  Resp: 17 17  Temp: 97.8 F (36.6 C) 97.7 F (36.5 C)  SpO2: 100% 100%    Subj on day of d/c   Awake coherent no distress looks well feels well No icterus no pallor Wife in the room  General Exam on discharge  EOMI NCAT no focal deficit looks about stated age S1-S2 no murmur No lower extremity edema Abdomen soft no rebound no guarding Cannot appreciate HSM splenomegaly Chest is clear no wheeze no rales no rhonchi  Discharge Instructions   Discharge Instructions     Diet - low sodium heart healthy   Complete by: As directed    Discharge instructions   Complete by: As directed    Follow-up with primary physician in about a week and get labs for LFTs etc. in about 2 weeks per Dr. Pasty will completely resolve and probably does not need general surgery input in the outpatient setting You have good questions about lipoprotein a etc.-cardiology should be able to feel those questions usually does not require endocrinology  input for noncomplicated diabetes unless A1c is greater than 10/very difficult to control her brittle diabetes with significant complications Do not take Tylenol  for the time being until you are LFTs resolve Good luck to you   Increase activity slowly   Complete by: As directed       Allergies as of 01/02/2024       Reactions   Pork-derived Products Other (See Comments)   Religious         Medication List      STOP taking these medications    acetaminophen  325 MG tablet Commonly known as: Tylenol    ezetimibe  10 MG tablet Commonly known as: ZETIA    furosemide  40 MG tablet Commonly known as: LASIX    potassium chloride  10 MEQ tablet Commonly known as: KLOR-CON    rosuvastatin  40 MG tablet Commonly known as: CRESTOR        TAKE these medications    amLODipine  5 MG tablet Commonly known as: NORVASC  Take 1 tablet (5 mg total) by mouth daily.   aspirin  EC 81 MG tablet Take 81 mg by mouth daily. Swallow whole.   clopidogrel  75 MG tablet Commonly known as: PLAVIX  Take 1 tablet (75 mg total) by mouth daily.   Jardiance  10 MG Tabs tablet Generic drug: empagliflozin  Take 1 tablet (10 mg total) by mouth daily.   metFORMIN  1000 MG tablet Commonly known as: GLUCOPHAGE  Take 1 tablet (1,000 mg total) by mouth 2 (two) times daily with a meal.   multivitamin with minerals tablet Take 1 tablet by mouth daily.   nitroGLYCERIN  0.4 MG SL tablet Commonly known as: NITROSTAT  Place 1 tablet (0.4 mg total) under the tongue every 5 (five) minutes as needed for chest pain. If you need more than 3 doses in 24 hours, call 911 or go to nearest ER   OneTouch Delica Plus Lancet33G Misc USE AS DIRECTED TO TEST BLOOD GLUCOSE   oxyCODONE  5 MG immediate release tablet Commonly known as: Oxy IR/ROXICODONE  Take 1 tablet (5 mg total) by mouth every 6 (six) hours as needed for severe pain (pain score 7-10).   Pen Needles 32G X 4 MM Misc Use with Victoza  to inject once daily   predniSONE  20 MG tablet Commonly known as: DELTASONE  Take 1 tab daily in case of gout flare-must follow-up with primary care physician if does not resolve with this What changed:  how much to take how to take this when to take this additional instructions   SOS-AMI selatogrel or placebo 16 mg/0.5 mL SQ injection Inject 0.5 mLs (16 mg total) into the skin as needed (As needed for for symptoms of a heart attack (chest  pain).).   Trulicity  3 MG/0.5ML Soaj Generic drug: Dulaglutide  Inject 3 mg into the skin once a week. What changed: additional instructions       Allergies  Allergen Reactions   Pork-Derived Products Other (See Comments)    Religious     Follow-up Information     Stechschulte, Deward PARAS, MD Follow up in 4 week(s).   Specialty: Surgery Contact information: [address] [place] [address] [phone]                  The results of significant diagnostics from this hospitalization (including imaging, microbiology, ancillary and laboratory) are listed below for reference.    Significant Diagnostic Studies: MR ABDOMEN MRCP W WO CONTAST Result Date: 01/02/2024 EXAM: MRCP WITH AND WITHOUT IV CONTRAST 01/02/2024 12:40:25 AM TECHNIQUE: Multisequence, multiplanar magnetic resonance images of  the abdomen with and without intravenous contrast. MRCP sequences were performed. COMPARISON: None available. CLINICAL HISTORY: Epigastric pain; Ultrasound and CT concerning for cholecystitis, negative HIDA scan, significant LFT elevation. FINDINGS: PLEASE REFER TO REPORT FROM ACCESSION NUMBER [id number] CHL Electronically signed by: Waddell Calk MD 01/02/2024 06:53 AM EDT RP Workstation: [id number]   MR 3D Recon At Scanner Result Date: 01/02/2024 EXAM: MRCP WITHOUT IV CONTRAST 01/02/2024 12:41:15 AM TECHNIQUE: Multisequence, multiplanar magnetic resonance images of the abdomen without intravenous contrast. MRCP sequences were performed. COMPARISON: CT from 12/30/2023. CLINICAL HISTORY: Epigastric pain. FINDINGS: LIVER: Hemangioma identified within segment 2 of the left lobe of liver measuring 1.5 cm. No suspicious liver lesion. GALLBLADDER AND BILIARY SYSTEM: Gallbladder wall thickness measures 4 mm, image 11/5. No pericholecystic inflammation. No gallstones. Normal caliber common bile duct without signs of choledocholithiasis. SPLEEN: Spleen appears normal.  PANCREAS/PANCREATIC DUCT: No pancreatic ductal dilatation, inflammation or mass. ADRENAL GLANDS: Normal adrenal glands. KIDNEYS: Simple-appearing cyst within the medial cortex of the left mid kidney measures 1 cm, image 18/6. No follow up imaging recommended. Normal appearance of the right kidney. LYMPH NODES: No enlarged abdominal lymph nodes. VASULATURE: Unremarkable. PERITONEUM: No ascites. ABDOMINAL WALL: No hernia. No mass. BOWEL: No dilated bowel loops. BONES: Visualized osseous structures are unremarkable. SOFT TISSUES: Unremarkable. MISCELLANEOUS: Unremarkable. IMPRESSION: 1. Partially decompressed gallbladder with gallbladder wall thickening measuring 4 mm. The mildly thickened gallbladder is nonspecific and may reflect incomplete distention. Although this may be seen with acute and chronic cholecystitis, inflammatory or infectious hepatitis may also account for this finding. 2. Hemangioma in segment 2 of the left lobe of the liver measuring 1.5 cm. No suspicious liver lesion. 3. Simple-appearing cyst in the medial cortex of the left mid kidney measuring 1 cm. No follow-up imaging recommended. Electronically signed by: Waddell Calk MD 01/02/2024 05:17 AM EDT RP Workstation: [id number]   NM Hepatobiliary Liver Func Result Date: 01/01/2024 EXAM: NM HEPATOBILLARY SCAN 01/01/2024 12:43:52 PM TECHNIQUE: RADIOPHARMACEUTICAL: 5.4 mCi Tc-30m mebrofenin  (CHOLETEC ) injection Dynamic images of the abdomen and pelvis were obtained in the anterior projection for 1 hour after intravenous administration of radiopharmaceutical. COMPARISON: None available. CLINICAL HISTORY: RUQ abdominal pain, biliary disease suspected, US  nondiagnostic. FINDINGS: Homogenous uptake within the liver. Normal clearance of the blood pool. Appropriate excretion into the biliary system. Activity is visualized in the small bowel. Activity is visualized in the gallbladder. IMPRESSION: 1. The common bile duct and cystic duct are patent. No acute  cholecystitis. Electronically signed by: Waddell Calk MD 01/01/2024 01:14 PM EDT RP Workstation: [id number]   US  Abdomen Limited RUQ (LIVER/GB) Result Date: 12/31/2023 CLINICAL DATA:  Right upper quadrant pain EXAM: ULTRASOUND ABDOMEN LIMITED RIGHT UPPER QUADRANT COMPARISON:  CT from the previous day. FINDINGS: Gallbladder: Gallbladder is incompletely distended accentuating the degree of wall thickening pericholecystic fluid is noted. No definitive gallstones are seen. Common bile duct: Diameter: 4 mm Liver: No focal lesion identified. Within normal limits in parenchymal echogenicity. Portal vein is patent on color Doppler imaging with normal direction of blood flow towards the liver. Other: None. IMPRESSION: Gallbladder wall thickening and pericholecystic fluid suspicious for acute cholecystitis. No gallstones are seen. The overall appearance is similar to that seen on prior CT. Electronically Signed   By: Oneil Devonshire M.D.   On: 12/31/2023 03:28   CT ABDOMEN PELVIS W CONTRAST Result Date: 12/30/2023 CLINICAL DATA:  Epigastric pain. EXAM: CT ABDOMEN AND PELVIS WITH CONTRAST TECHNIQUE: Multidetector CT imaging of the abdomen and pelvis was performed using the standard protocol following bolus administration  of intravenous contrast. RADIATION DOSE REDUCTION: This exam was performed according to the departmental dose-optimization program which includes automated exposure control, adjustment of the mA and/or kV according to patient size and/or use of iterative reconstruction technique. CONTRAST:  OMNIPAQUE  IOHEXOL  300 MG/ML  SOLN COMPARISON:  None Available. FINDINGS: Lower chest: Multiple sternal wires are noted. No acute abnormality. Hepatobiliary: No focal liver abnormality is seen. Mild diffuse gallbladder wall thickening is seen without evidence of gallstones or pericholecystic inflammatory fat stranding. Pancreas: Unremarkable. No pancreatic ductal dilatation or surrounding inflammatory changes.  Spleen: Normal in size without focal abnormality. Adrenals/Urinary Tract: Adrenal glands are unremarkable. Kidneys are normal in size, without renal calculi or hydronephrosis. A subcentimeter simple cyst is seen within the mid left kidney. Bladder is unremarkable. Stomach/Bowel: Stomach is within normal limits. The appendix is not clearly identified. No evidence of bowel wall thickening, distention, or inflammatory changes. Vascular/Lymphatic: No significant vascular findings are present. No enlarged abdominal or pelvic lymph nodes. Reproductive: The prostate gland is mildly enlarged. Other: No abdominal wall hernia or abnormality. No abdominopelvic ascites. Musculoskeletal: No acute or significant osseous findings. IMPRESSION: 1. Mild diffuse gallbladder wall thickening without evidence of gallstones or pericholecystic inflammatory fat stranding. Further evaluation with a right upper quadrant ultrasound is recommended if acute cholecystitis is of clinical concern. 2. Mildly enlarged prostate gland. Correlation with PSA levels is recommended. Electronically Signed   By: Suzen Dials M.D.   On: 12/30/2023 22:17   DG Chest 1 View Result Date: 12/30/2023 CLINICAL DATA:  Acute epigastric and chest pain. EXAM: CHEST  1 VIEW COMPARISON:  December 20, 2023. FINDINGS: The heart size and mediastinal contours are within normal limits. Status post coronary artery bypass graft. Right lung is clear. Elevated left hemidiaphragm is noted with minimal left pleural effusion. The visualized skeletal structures are unremarkable. IMPRESSION: Elevated left hemidiaphragm with minimal left pleural effusion. Electronically Signed   By: Lynwood Landy Raddle M.D.   On: 12/30/2023 13:41   DG Chest 2 View Result Date: 12/20/2023 CLINICAL DATA:  s/p cabg. EXAM: CHEST - 2 VIEW COMPARISON:  11/26/2023. FINDINGS: There is residual subtle left pleural effusion, significantly decreased since the prior study. Bilateral lung fields are clear. Right  costophrenic angle is clear. No pneumothorax. Normal cardio-mediastinal silhouette. There are surgical staples along the heart border and sternotomy wires, status post CABG (coronary artery bypass graft). No acute osseous abnormalities. The soft tissues are within normal limits. IMPRESSION: Residual subtle left pleural effusion, significantly decreased since the prior study. Electronically Signed   By: Ree Molt M.D.   On: 12/20/2023 13:53    Microbiology: Recent Results (from the past 240 hours)  Surgical pcr screen     Status: None   Collection Time: 12/31/23 11:50 AM   Specimen: Nasal Mucosa; Nasal Swab  Result Value Ref Range Status   MRSA, PCR NEGATIVE NEGATIVE Final   Staphylococcus aureus NEGATIVE NEGATIVE Final    Comment: (NOTE) The Xpert SA Assay (FDA approved for NASAL specimens in patients 71 years of age and older), is one component of a comprehensive surveillance program. It is not intended to diagnose infection nor to guide or monitor treatment. Performed at [facility], [address], [place], [place] [address]      Labs: Basic Metabolic Panel: Recent Labs  Lab 12/30/23 1322 12/31/23 0644 01/01/24 0752 01/02/24 0704  NA 137 136 138 137  K 4.3 4.0 4.1 4.1  CL 102 106 107 104  CO2 23 21* 22  22  GLUCOSE 129* 157* 130* 174*  BUN 12 13 13 12   CREATININE 0.74 0.61 0.69 0.71  CALCIUM  9.3 9.0 9.1 9.0  MG  --   --  2.0 1.8  PHOS  --   --  4.6 4.0   Liver Function Tests: Recent Labs  Lab 12/30/23 2130 12/31/23 0644 01/01/24 0752 01/02/24 0704  AST 524* 1,041* 232* 81*  ALT 299* 625* 374* 235*  ALKPHOS 154* 169* 147* 124  BILITOT 1.5* 1.2 0.9 0.8  PROT 7.0 6.4* 6.3* 6.2*  ALBUMIN  4.3 3.5 3.5 3.4*   Recent Labs  Lab 12/30/23 2130 01/02/24 0704  LIPASE 65* 42   No results for input(s): AMMONIA in the last 168 hours. CBC: Recent Labs  Lab 12/30/23 1322 12/31/23 0644 01/01/24 0752 01/02/24 0704  WBC 6.8 2.8* 2.4* 2.7*  NEUTROABS  --   1.7  --   --   HGB 11.9* 11.8* 11.5* 11.4*  HCT 38.6* 36.8* 36.4* 36.3*  MCV 86.2 82.5 83.9 84.4  PLT 199 179 167 169   Cardiac Enzymes: No results for input(s): CKTOTAL, CKMB, CKMBINDEX, TROPONINI in the last 168 hours. BNP: BNP (last 3 results) No results for input(s): BNP in the last 8760 hours.  ProBNP (last 3 results) No results for input(s): PROBNP in the last 8760 hours.  CBG: Recent Labs  Lab 01/01/24 1941 01/01/24 2304 01/02/24 0428 01/02/24 0749 01/02/24 0905  GLUCAP 155* 123* 106* 149* 164*    Signed:  Colen Grimes MD   [facility] 01/02/2024, 11:24 AM

## 2024-01-03 ENCOUNTER — Telehealth: Payer: Self-pay

## 2024-01-03 ENCOUNTER — Other Ambulatory Visit (HOSPITAL_COMMUNITY): Payer: Self-pay

## 2024-01-04 ENCOUNTER — Other Ambulatory Visit: Payer: Self-pay

## 2024-01-04 DIAGNOSIS — R7989 Other specified abnormal findings of blood chemistry: Secondary | ICD-10-CM

## 2024-01-04 NOTE — Telephone Encounter (Signed)
 Pt made aware of Dr. Charlanne recommendations. Pt was notified to mark his Calendar for 01/17/2024 top have LFT drawn. Location to lab provided. Pt was scheduled to see Deanna May NP on 01/19/2024 at 1:30 PM. Pt made aware.  ( 7 day hold) Pt verbalized understanding with all questions answered.

## 2024-01-06 ENCOUNTER — Telehealth: Payer: Self-pay | Admitting: Cardiovascular Disease

## 2024-01-06 ENCOUNTER — Ambulatory Visit: Admitting: Family Medicine

## 2024-01-06 ENCOUNTER — Other Ambulatory Visit: Payer: Self-pay

## 2024-01-06 ENCOUNTER — Telehealth (HOSPITAL_COMMUNITY): Payer: Self-pay

## 2024-01-06 VITALS — BP 106/80 | HR 71 | Temp 97.9°F | Resp 23 | Ht 69.0 in | Wt 164.6 lb

## 2024-01-06 DIAGNOSIS — M109 Gout, unspecified: Secondary | ICD-10-CM

## 2024-01-06 DIAGNOSIS — E1165 Type 2 diabetes mellitus with hyperglycemia: Secondary | ICD-10-CM

## 2024-01-06 DIAGNOSIS — R7989 Other specified abnormal findings of blood chemistry: Secondary | ICD-10-CM

## 2024-01-06 DIAGNOSIS — Z951 Presence of aortocoronary bypass graft: Secondary | ICD-10-CM

## 2024-01-06 DIAGNOSIS — R748 Abnormal levels of other serum enzymes: Secondary | ICD-10-CM

## 2024-01-06 DIAGNOSIS — Z7985 Long-term (current) use of injectable non-insulin antidiabetic drugs: Secondary | ICD-10-CM

## 2024-01-06 DIAGNOSIS — Z7984 Long term (current) use of oral hypoglycemic drugs: Secondary | ICD-10-CM

## 2024-01-06 LAB — CBC
HCT: 37.4 % — ABNORMAL LOW (ref 39.0–52.0)
Hemoglobin: 11.9 g/dL — ABNORMAL LOW (ref 13.0–17.0)
MCHC: 32 g/dL (ref 30.0–36.0)
MCV: 82.4 fl (ref 78.0–100.0)
Platelets: 176 K/uL (ref 150.0–400.0)
RBC: 4.53 Mil/uL (ref 4.22–5.81)
RDW: 15.2 % (ref 11.5–15.5)
WBC: 3.1 K/uL — ABNORMAL LOW (ref 4.0–10.5)

## 2024-01-06 LAB — COMPREHENSIVE METABOLIC PANEL WITH GFR
ALT: 74 U/L — ABNORMAL HIGH (ref 0–53)
AST: 17 U/L (ref 0–37)
Albumin: 4.4 g/dL (ref 3.5–5.2)
Alkaline Phosphatase: 97 U/L (ref 39–117)
BUN: 18 mg/dL (ref 6–23)
CO2: 25 meq/L (ref 19–32)
Calcium: 9.3 mg/dL (ref 8.4–10.5)
Chloride: 102 meq/L (ref 96–112)
Creatinine, Ser: 0.69 mg/dL (ref 0.40–1.50)
GFR: 102.95 mL/min (ref >60.00)
Glucose, Bld: 89 mg/dL (ref 70–99)
Potassium: 4.3 meq/L (ref 3.5–5.1)
Sodium: 137 meq/L (ref 135–145)
Total Bilirubin: 0.5 mg/dL (ref 0.2–1.2)
Total Protein: 6.9 g/dL (ref 6.0–8.3)

## 2024-01-06 LAB — URIC ACID: Uric Acid, Serum: 2.5 mg/dL — ABNORMAL LOW (ref 4.0–7.8)

## 2024-01-06 LAB — LIPASE: Lipase: 22 U/L (ref 11.0–59.0)

## 2024-01-06 MED ORDER — EMPAGLIFLOZIN 25 MG PO TABS: 25.0000 mg | ORAL_TABLET | Freq: Every day | ORAL | 1 refills | Status: DC

## 2024-01-06 MED ORDER — EMPAGLIFLOZIN 25 MG PO TABS
25.0000 mg | ORAL_TABLET | Freq: Every day | ORAL | 1 refills | Status: AC
Start: 2024-01-06 — End: ?
  Filled 2024-01-06 – 2024-01-14 (×5): qty 90, 90d supply, fill #0
  Filled 2024-03-24 – 2024-04-07 (×3): qty 90, 90d supply, fill #1

## 2024-01-06 NOTE — Patient Instructions (Addendum)
 I would recommend restarting the trulicity  if readings after meals are persistently elevated. I will recheck lipase and liver tests today. I will refer you to endocrinology to discuss treatment of diabetes. We will increase jardiance  to 25mg  for now.   Follow up with cardiology and pharmacist as planned to discuss cholesterol treatment options.   Xray of left toe at location below. I suspect you may have gout, but no new meds for now until liver tests stabilize. Recheck in 1 month - could consider allopurinol at that time. Ok to use the prednisone  if needed for flare only.   Return to the clinic or go to the nearest emergency room if any of your symptoms worsen or new symptoms occur.   [facility] or xray: Walk in 8:30-4:30 during weekdays, no appointment needed [address]  [place], [place] [address]

## 2024-01-06 NOTE — Progress Notes (Signed)
 Subjective:  Patient ID: Marcus Hart, male    DOB: 1965/10/11  Age: 58 y.o. MRN: [id number]  CC:  Chief Complaint  Patient presents with   Follow-up    Follow up from ED. Admitted 12/31/23 for Acute cholecystitis. Discharged 01/02/24. Doing well. Has a lot of questions and concerns. Would like a endo referral. Also wants to be treated for chronic gout    HPI Marcus Hart presents for   Hospital follow-up.  TOC call has not been completed. Discharge summary reviewed. Admitted 8/14 through 01/02/2024   Here with spouse.   Elevated liver enzymes, question of acalculus cholecystitis Initially with nausea and vomiting, thickened gallbladder wall on imaging, HIDA scan showed patent common bile duct, cystic duct with no acute cholecystitis.  General surgery saw patient and felt he could follow-up in office if needed but no acute workup inpatient.  Elevated liver enzymes with negative hepatitis panel, peak AST 1041, ALT 625, bilirubin 1.5.  GI consulted, drug-induced liver injury versus biliary source.  Crestor  had been increased from 5 mg to 40 mg daily in addition to Zetia  being added for risk modification from CAD.   LFTs down trended to an AST of 81, ALT of 235, bili of 0.8.  Eating drinking and stable symptoms on 8/17.  Plan for follow-up with cardiology, lipid clinic to discuss secondary prevention -held Crestor  and Zetia .  Cardiology Dr. Wonda. Plan for outpatient GI follow-up.  Dr. Charlanne.  Avoid hepatotoxic medications including Crestor  or Zetia , Tylenol . MRCP on 8/17, partially decompressed gallbladder with gallbladder wall thickening measuring 4 mm.  Nonspecific.  Hemangioma in the left lobe of the liver measuring 1.5 cm, no suspicious liver lesions.  Simple appearing cyst in the medial cortex of the left mid kidney, 1 cm without follow-up imaging recommended. Appointment scheduled tomorrow with [facility], and visit with Dr. Wonda October 31.  No  further pain. No nausea/vomiting since leaving hospital. Eating normally. Normal urination- no dark urine, no scleral icterus.no fever.  Feels ok. Still some fatigue. No acute changes.    Cardiac CAD with recent CABGx2  in June after NSTEMI.  Treated with Plavix , aspirin , beta-blocker, Crestor  initially.   Diabetes: Initially seen July 2 for hospital follow-up by my colleague.  Jardiance  was added for improved diabetic control as well as additional cardioprotection.  Continued on 1000 mg twice daily metformin , Trulicity  3 mg weekly.  Recent A1c improved at 7.0. He stopped trulicity  after spouse checked into med - rare case of liver injury with trulicity , and elevated lipase at 65 on 8/14. Normal 42 on 8/17. Usual dose would have been on 8/15 - last dose on 8/8.  Self stopped amlodipine  - spouse also researched possible liver elevation. Was told to use prior after cath for at least 2 months, and he had completed 2 months.  Still taking jardiance  and metformin . No mycotic or uti symptoms.  Home readings: Fasting 111 today. Usually around 120's.  Postprandial 180-200. No symptomatic lows.  Would like to meet with endocrine.   Lab Results  Component Value Date   HGBA1C 7.0 (H) 01/01/2024   HGBA1C 7.6 (H) 11/04/2023   HGBA1C 8.0 (H) 10/01/2023   Lab Results  Component Value Date   MICROALBUR <0.2 04/06/2015   LDLCALC 79 12/20/2023   CREATININE 0.71 01/02/2024   Lab Results  Component Value Date   WBC 2.7 (L) 01/02/2024   HGB 11.4 (L) 01/02/2024   HCT 36.3 (L) 01/02/2024   MCV 84.4  01/02/2024   PLT 169 01/02/2024     Toe pain, possible gout Noted on discharge summary, left toe pain, thought to be possible gout flare, treated with prednisone  20 mg daily for 3 days if needed.  On chart review he was treated by urgent care in August 1 for left great toe pain..  Prednisone  40 mg daily for 5 days at that time for possible gout. Ist episode.  Only slight feeling of pian in toe in  hospital, only slight pain - did not take prednisone . NKI. Slight swelling without redness in past. Not now.   No results found for: [facility]   History Patient Active Problem List   Diagnosis Date Noted   Abdominal pain 12/31/2023   Acute cholecystitis 12/31/2023   Elevated liver enzymes 12/30/2023   Elevated lipoprotein A level 11/30/2023   Pleural effusion 11/30/2023   S/P CABG x 2 11/08/2023   NSTEMI (non-ST elevated myocardial infarction) (HCC) 11/03/2023   Type 2 diabetes mellitus with hyperglycemia, without long-term current use of insulin  (HCC) 02/13/2013   Hyperlipidemia 02/13/2013   Past Medical History:  Diagnosis Date   Diabetes mellitus (HCC)    Diabetes mellitus without complication (HCC)    Phreesia 11/10/2019   Hyperlipidemia    STEMI (ST elevation myocardial infarction) [facility])    Past Surgical History:  Procedure Laterality Date   COLONOSCOPY     CORONARY ARTERY BYPASS GRAFT N/A 11/08/2023   Procedure: CORONARY ARTERY BYPASS GRAFTING X TWO USING LEFT INTERNAL MAMMARY ARTERY AND LEFT RADIAL ARTERY;  Surgeon: Kerrin Elspeth BROCKS, MD;  Location: [facility] OR;  Service: Open Heart Surgery;  Laterality: N/A;   CORONARY PRESSURE/FFR STUDY N/A 11/04/2023   Procedure: CORONARY PRESSURE/FFR STUDY;  Surgeon: Elmira Newman PARAS, MD;  Location: [facility] INVASIVE CV LAB;  Service: Cardiovascular;  Laterality: N/A;   CORONARY ULTRASOUND/IVUS N/A 11/04/2023   Procedure: Coronary Ultrasound/IVUS;  Surgeon: Elmira Newman PARAS, MD;  Location: [facility] INVASIVE CV LAB;  Service: Cardiovascular;  Laterality: N/A;   INTRAOPERATIVE TRANSESOPHAGEAL ECHOCARDIOGRAM N/A 11/08/2023   Procedure: ECHOCARDIOGRAM, TRANSESOPHAGEAL, INTRAOPERATIVE;  Surgeon: Kerrin Elspeth BROCKS, MD;  Location: [facility] OR;  Service: Open Heart Surgery;  Laterality: N/A;   LEFT HEART CATH AND CORONARY ANGIOGRAPHY N/A 11/04/2023   Procedure: LEFT HEART CATH AND CORONARY ANGIOGRAPHY;  Surgeon: Elmira Newman PARAS, MD;  Location: [facility]  INVASIVE CV LAB;  Service: Cardiovascular;  Laterality: N/A;   NO PAST SURGERIES     RADIAL ARTERY HARVEST Left 11/08/2023   Procedure: SURGICAL PROCUREMENT, ARTERY, RADIAL;  Surgeon: Kerrin Elspeth BROCKS, MD;  Location: [facility] OR;  Service: Open Heart Surgery;  Laterality: Left;   UPPER GASTROINTESTINAL ENDOSCOPY     Allergies  Allergen Reactions   Pork-Derived Products Other (See Comments)    Religious    Prior to Admission medications   Medication Sig Start Date End Date Taking? Authorizing Provider  aspirin  EC 81 MG tablet Take 81 mg by mouth daily. Swallow whole.   Yes [provider]  clopidogrel  (PLAVIX ) 75 MG tablet Take 1 tablet (75 mg total) by mouth daily. 12/10/23  Yes Trudy Birmingham, PA-C  empagliflozin  (JARDIANCE ) 10 MG TABS tablet Take 1 tablet (10 mg total) by mouth daily. 11/17/23  Yes Mahlon Comer BRAVO, MD  Insulin  Pen Needle (PEN NEEDLES) 32G X 4 MM MISC Use with Victoza  to inject once daily 06/30/21  Yes Levora Reyes SAUNDERS, MD  Lancets [facility] DELICA PLUS [place]) MISC USE AS DIRECTED TO TEST BLOOD GLUCOSE 11/03/21  Yes Levora Reyes SAUNDERS, MD  metFORMIN  (GLUCOPHAGE ) 1000 MG tablet Take 1 tablet (1,000 mg total) by mouth 2 (two) times daily with a meal. 12/16/23  Yes Levora Reyes SAUNDERS, MD  Multiple Vitamins-Minerals (MULTIVITAMIN WITH MINERALS) tablet Take 1 tablet by mouth daily.   Yes [provider]  nitroGLYCERIN  (NITROSTAT ) 0.4 MG SL tablet Place 1 tablet (0.4 mg total) under the tongue every 5 (five) minutes as needed for chest pain. If you need more than 3 doses in 24 hours, call 911 or go to nearest ER 12/29/23  Yes Wonda Sharper, MD  amLODipine  (NORVASC ) 5 MG tablet Take 1 tablet (5 mg total) by mouth daily. Patient not taking: Reported on 01/06/2024 11/13/23   Raguel Con RAMAN, PA-C  Dulaglutide  (TRULICITY ) 3 MG/0.5ML SOAJ Inject 3 mg into the skin once a week. Patient not taking: Reported on 01/06/2024 10/08/23   Levora Reyes SAUNDERS, MD  oxyCODONE  (OXY  IR/ROXICODONE ) 5 MG immediate release tablet Take 1 tablet (5 mg total) by mouth every 6 (six) hours as needed for severe pain (pain score 7-10). Patient not taking: Reported on 01/06/2024 11/13/23   Raguel Con RAMAN, PA-C  predniSONE  (DELTASONE ) 20 MG tablet Take 1 tablet (20 mg total) by mouth daily in case of gout flare-must follow-up with primary care physician if does not resolve with this Patient not taking: Reported on 01/06/2024 01/02/24   Samtani, Jai-Gurmukh, MD  Study - SOS-AMI - selatogrel 16 mg/0.5 mL or placebo SQ injection (PI-Christopher) Inject 0.5 mLs (16 mg total) into the skin as needed (As needed for for symptoms of a heart attack (chest pain).). Patient not taking: Reported on 01/06/2024 12/29/23   Lonni Slain, MD   Social History   Socioeconomic History   Marital status: Married    Spouse name: [name]   Number of children: 4   Years of education: Not on file   Highest education level: Bachelor's degree (e.g., BA, AB, BS)  Occupational History   Occupation: Designer, television/film set  Tobacco Use   Smoking status: Never   Smokeless tobacco: Never  Vaping Use   Vaping status: Never Used  Substance and Sexual Activity   Alcohol use: Never    Alcohol/week: 0.0 standard drinks of alcohol   Drug use: No   Sexual activity: Yes  Other Topics Concern   Not on file  Social History Narrative   Married   Education: Automotive engineer   Exercise: No   Social Drivers of Corporate investment banker Strain: Medium Risk (06/18/2023)   Overall Financial Resource Strain (CARDIA)    Difficulty of Paying Living Expenses: Somewhat hard  Food Insecurity: No Food Insecurity (12/31/2023)   Hunger Vital Sign    Worried About Running Out of Food in the Last Year: Never true    Ran Out of Food in the Last Year: Never true  Recent Concern: Food Insecurity - Food Insecurity Present (11/06/2023)   Hunger Vital Sign    Worried About Running Out of Food in the Last Year: Sometimes true    Ran Out of Food  in the Last Year: Sometimes true  Transportation Needs: No Transportation Needs (12/31/2023)   PRAPARE - Administrator, Civil Service (Medical): No    Lack of Transportation (Non-Medical): No  Physical Activity: Insufficiently Active (06/18/2023)   Exercise Vital Sign    Days of Exercise per Week: 1 day    Minutes of Exercise per Session: 30 min  Stress: No Stress Concern Present (06/18/2023)   [employer] of Occupational Health - Occupational  Stress Questionnaire    Feeling of Stress : Not at all  Social Connections: Socially Integrated (12/31/2023)   Social Connection and Isolation Panel    Frequency of Communication with Friends and Family: Twice a week    Frequency of Social Gatherings with Friends and Family: Twice a week    Attends Religious Services: 1 to 4 times per year    Active Member of [employer] or Organizations: Yes    Attends Banker Meetings: 1 to 4 times per year    Marital Status: Married  Catering manager Violence: Not At Risk (12/31/2023)   Humiliation, Afraid, Rape, and Kick questionnaire    Fear of Current or Ex-Partner: No    Emotionally Abused: No    Physically Abused: No    Sexually Abused: No    Review of Systems Per HPI  Objective:   Vitals:   01/06/24 1113  BP: 106/80  Pulse: 71  Resp: (!) 23  Temp: 97.9 F (36.6 C)  TempSrc: Temporal  SpO2: 100%  Weight: 164 lb 9.6 oz (74.7 kg)  Height: 5' 9 (1.753 m)     Physical Exam Vitals reviewed.  Constitutional:      Appearance: He is well-developed.  HENT:     Head: Normocephalic and atraumatic.  Eyes:     Comments: No scleral icterus.  Neck:     Vascular: No carotid bruit or JVD.  Cardiovascular:     Rate and Rhythm: Normal rate and regular rhythm.     Heart sounds: Normal heart sounds. No murmur heard. Pulmonary:     Effort: Pulmonary effort is normal.     Breath sounds: Normal breath sounds. No rales.  Abdominal:     General: Abdomen is flat. There is no  distension.     Tenderness: There is no abdominal tenderness. There is no guarding or rebound.     Comments: Abdomen nontender, nondistended, negative Murphy's.  Musculoskeletal:     Right lower leg: No edema.     Left lower leg: No edema.  Skin:    General: Skin is warm and dry.  Neurological:     Mental Status: He is alert and oriented to person, place, and time.  Psychiatric:        Mood and Affect: Mood normal.       62 minutes spent during visit, including chart review, hospital record review regarding LFTs, surgical and gastroenterology consults, labs and plan from hospitalization, discussion of current medication including diabetic treatments, self discontinuation of Trulicity  as above, review of plan moving forward, counseling and assimilation of information, exam, discussion of plan, and chart completion.   Assessment & Plan:  Marcus Hart is a 58 y.o. male . Type 2 diabetes mellitus with hyperglycemia, without long-term current use of insulin  (HCC) - Plan: Ambulatory referral to Endocrinology, empagliflozin  (JARDIANCE ) 25 MG TABS tablet, DISCONTINUED: empagliflozin  (JARDIANCE ) 25 MG TABS tablet  - I understand the possible concern with patient, spouse with Trulicity , slightly elevated lipase, but improved on recheck, unlikely cause.  We discussed concerns of elevated postprandials.  Will increase dose of Jardiance , would consider restarting Trulicity , but also referring to endocrinology for management of diabetes.  S/P CABG x 2  - Asymptomatic, continue follow-up with cardiology, including discussion of lipid management given possible drug-induced liver injury and concern for statin cause. . Elevated LFTs - Plan: Comprehensive metabolic panel with GFR, CBC  - As above, possible drug-induced liver injury versus other intrinsic cause but overall reassuring imaging as  above.  Check updated CMP, keep follow-up with gastroenterology as planned with RTC/ER precautions  given.  Elevated lipase - Plan: Lipase  - Off Trulicity  as above, however lipase was normal in August 17.  Last dose of bacillus that he had been on August 8 though.  Check updated lipase, option of restart of Trulicity  as above or can be discussed with endocrinology.  May need to adjust diabetic coverage if persistent elevated postprandials.  Gout involving toe of left foot, unspecified cause, unspecified chronicity - Plan: Uric acid, DG Toe Great Left  - Clinically appears to be gout with initial podagra.  Check uric acid, held on prednisone  for now.  Will hold on allopurinol for now and a recent flare and sorting out elevated LFTs, above.  Will check imaging to verify underlying bony injury as cause of symptoms.  RTC precautions.  Meds ordered this encounter  Medications   DISCONTD: empagliflozin  (JARDIANCE ) 25 MG TABS tablet    Sig: Take 1 tablet (25 mg total) by mouth daily before breakfast.    Dispense:  90 tablet    Refill:  1   empagliflozin  (JARDIANCE ) 25 MG TABS tablet    Sig: Take 1 tablet (25 mg total) by mouth daily before breakfast.    Dispense:  90 tablet    Refill:  1   Patient Instructions  I would recommend restarting the trulicity  if readings after meals are persistently elevated. I will recheck lipase and liver tests today. I will refer you to endocrinology to discuss treatment of diabetes. We will increase jardiance  to 25mg  for now.   Follow up with cardiology and pharmacist as planned to discuss cholesterol treatment options.   Xray of left toe at location below. I suspect you may have gout, but no new meds for now until liver tests stabilize. Recheck in 1 month - could consider allopurinol at that time. Ok to use the prednisone  if needed for flare only.   Return to the clinic or go to the nearest emergency room if any of your symptoms worsen or new symptoms occur.   [facility] or xray: Walk in 8:30-4:30 during weekdays, no appointment needed [address]   [place], [place] [address]         Signed,   Reyes Pines, MD [facility], [facility] 01/06/24 11:29 AM

## 2024-01-06 NOTE — Telephone Encounter (Signed)
 Called [facility], left a message for Dr. Margurite office regarding the prior authorization request they were supposed to have sent for this patient's insurance.

## 2024-01-06 NOTE — Telephone Encounter (Signed)
 Office calling for the cardiac clearance for rehab for the patient insurance. Please advise

## 2024-01-07 ENCOUNTER — Ambulatory Visit: Attending: Internal Medicine | Admitting: Pharmacist

## 2024-01-07 ENCOUNTER — Telehealth (HOSPITAL_COMMUNITY): Payer: Self-pay

## 2024-01-07 ENCOUNTER — Other Ambulatory Visit (HOSPITAL_COMMUNITY): Payer: Self-pay

## 2024-01-07 DIAGNOSIS — E7841 Elevated Lipoprotein(a): Secondary | ICD-10-CM | POA: Diagnosis not present

## 2024-01-07 MED ORDER — METOPROLOL TARTRATE 25 MG PO TABS
12.5000 mg | ORAL_TABLET | Freq: Two times a day (BID) | ORAL | 3 refills | Status: DC
  Filled 2024-01-07: qty 90, 90d supply, fill #0

## 2024-01-07 NOTE — Telephone Encounter (Signed)
 [facility] called back, stated patient has not seen Dr. Wonda yet and doesn't have an appt with him until October. Explained the prior authorization request, the office said they would work on it and get back to us .

## 2024-01-07 NOTE — Telephone Encounter (Signed)
 Spoke with Hailey from cardiac rehab who stated that they need a prior auth to be sent to [employer]. Asked Hailey if she knew how to start that or who to send that to and she said normally we will have to go onto the insurance website. Went and printed off the prior auth form from [employer] to fill out.

## 2024-01-07 NOTE — Assessment & Plan Note (Addendum)
 Assessment: Last LDL-C was 79 on rosuvastatin  40 mg daily Patient is now off of rosuvastatin  and ezetimibe  due to extremely elevated LFTs on concurrent therapy Episode appears to be potentially from statin and/or Zetia  and therefore out of caution we will avoid statin and Zetia  use. LFTs have declined nicely off of statin and Zetia  We discussed PCSK9 and that data suggest it has a greater benefit in patients with elevated LP(a).  Low risk of elevated LFTs.  Injection technique reviewed. Patient walks 15 to 30 minutes about 3 times a day  Plan: I confirmed with Dr. Charlanne that use of Repatha is okay from a GI standpoint Will have team submit prior authorization for Repatha

## 2024-01-07 NOTE — Progress Notes (Signed)
 Patient ID: Marcus Hart                 DOB: [birth year]                    MRN: [id number]      HPI: Marcus Hart is a 58 y.o. male patient referred to lipid clinic by Dr. Elgie Pouch. PMH is significant for NSTEMI s/p CABGx2 10/2023, DM, HLD, elevated LP(a) 219. Rosuvastatin  increased to 40mg  post CABG. Zetia  was added 8/6 when LDL-C resulted at 79 on rosuvastatin  40mg  daily. LFT normal on 8/4. He was admitted 8/14 with nausea/vomiting, found to have significantly elevated LFT. (peak AST 1041/ALT 625 bilirubin 1.5),  Per GI Etiology for this acute episode and associated significant transaminitis not clear at present.  Unusual for DILI to cause severe pain, still concerned this was a biliary episode ?etiology. US  with ?cholecystitis, but HIDA is neg. Agree with Sx that above is not consistent with acute cholecystitis. Could be DILI-Zetia , high-dose Crestor , doubt ischemic. Neg acute hepatitis panel, neg Tylenol  level.  No alcohol.  Avoid hepatotoxic medications including Crestor /Zetia .   Patient presents today accompanied by his wife.  Patient's wife has a very good medical knowledge.  Per previous note wife went to medical school in [place]. We reviewed the lack of targeted LP(a) treatment currently available although I anticipate potentially something being approved within the next year depending on outcomes of trial.  At least 4 drugs in the pipeline.  We discussed PCSK9 and that data suggest it has a greater benefit in patients with elevated LP(a).  Low risk of elevated LFTs.  Injection technique reviewed.   Current Medications: none Intolerances: Rosuvastatin  40 mg, ezetimibe  10 mg (elevated LFTs) Risk Factors: Diabetes, non-STEMI status post CABG, elevated LP(a) LDL-C goal: Less than 70 but certainly could consider less than 55 ApoB goal: Less than 80 but certainly could consider less than 70  Diet:  3 meals per day Breakfast: light- milk w/ tea  w/ 2% milk, cookie or fruit Lunch: bean, vegetables, salads, eggs, falafal Dinner: rice, grilled chicken, vegetables- eats w/ 1/2 -1 pita bread Snack: bean nuts, raisin, dried apricot Drink: tea  Exercise: walks 15-30 min 3 times a day, plans to do cardiac rehab depending on insurance coverage  Family History:  Family History  Problem Relation Age of Onset   Diabetes Mother    Bladder Cancer Mother    Hypertension Mother    Ulcers Father    Benign prostatic hyperplasia Father    Colon cancer Neg Hx    Colon polyps Neg Hx    Esophageal cancer Neg Hx    Rectal cancer Neg Hx    Stomach cancer Neg Hx      Social History: no tobacco, no ETOH  Labs: Lipid Panel     Component Value Date/Time   CHOL 147 12/20/2023 0844   TRIG 67 12/20/2023 0844   HDL 55 12/20/2023 0844   CHOLHDL 2.7 12/20/2023 0844   CHOLHDL 2.6 11/04/2023 0356   VLDL 13 11/04/2023 0356   LDLCALC 79 12/20/2023 0844   LABVLDL 13 12/20/2023 0844    Past Medical History:  Diagnosis Date   Diabetes mellitus (HCC)    Diabetes mellitus without complication (HCC)    Phreesia 11/10/2019   Hyperlipidemia    STEMI (ST elevation myocardial infarction) (HCC)     Current Outpatient Medications on File Prior to Visit  Medication Sig Dispense Refill   amLODipine  (NORVASC )  5 MG tablet Take 1 tablet (5 mg total) by mouth daily. (Patient not taking: Reported on 01/06/2024) 30 tablet 1   aspirin  EC 81 MG tablet Take 81 mg by mouth daily. Swallow whole.     clopidogrel  (PLAVIX ) 75 MG tablet Take 1 tablet (75 mg total) by mouth daily. 90 tablet 3   Dulaglutide  (TRULICITY ) 3 MG/0.5ML SOAJ Inject 3 mg into the skin once a week. (Patient not taking: Reported on 01/06/2024) 6 mL 1   empagliflozin  (JARDIANCE ) 25 MG TABS tablet Take 1 tablet (25 mg total) by mouth daily before breakfast. 90 tablet 1   Insulin  Pen Needle (PEN NEEDLES) 32G X 4 MM MISC Use with Victoza  to inject once daily 90 each 3   Lancets (ONETOUCH DELICA PLUS  LANCET33G) MISC USE AS DIRECTED TO TEST BLOOD GLUCOSE 100 each 1   metFORMIN  (GLUCOPHAGE ) 1000 MG tablet Take 1 tablet (1,000 mg total) by mouth 2 (two) times daily with a meal. 180 tablet 1   Multiple Vitamins-Minerals (MULTIVITAMIN WITH MINERALS) tablet Take 1 tablet by mouth daily.     nitroGLYCERIN  (NITROSTAT ) 0.4 MG SL tablet Place 1 tablet (0.4 mg total) under the tongue every 5 (five) minutes as needed for chest pain. If you need more than 3 doses in 24 hours, call 911 or go to nearest ER 25 tablet 3   oxyCODONE  (OXY IR/ROXICODONE ) 5 MG immediate release tablet Take 1 tablet (5 mg total) by mouth every 6 (six) hours as needed for severe pain (pain score 7-10). (Patient not taking: Reported on 01/06/2024) 28 tablet 0   predniSONE  (DELTASONE ) 20 MG tablet Take 1 tablet (20 mg total) by mouth daily in case of gout flare-must follow-up with primary care physician if does not resolve with this (Patient not taking: Reported on 01/06/2024) 3 tablet 0   Study - SOS-AMI - selatogrel 16 mg/0.5 mL or placebo SQ injection (PI-Christopher) Inject 0.5 mLs (16 mg total) into the skin as needed (As needed for for symptoms of a heart attack (chest pain).). (Patient not taking: Reported on 01/06/2024) 1 mL 0   No current facility-administered medications on file prior to visit.    Allergies  Allergen Reactions   Pork-Derived Products Other (See Comments)    Religious     Assessment/Plan:  1. Hyperlipidemia -  Hyperlipidemia Assessment: Last LDL-C was 79 on rosuvastatin  40 mg daily Patient is now off of rosuvastatin  and ezetimibe  due to extremely elevated LFTs on concurrent therapy Episode appears to be potentially from statin and/or Zetia  and therefore out of caution we will avoid statin and Zetia  use. LFTs have declined nicely off of statin and Zetia  We discussed PCSK9 and that data suggest it has a greater benefit in patients with elevated LP(a).  Low risk of elevated LFTs.  Injection technique  reviewed. Patient walks 15 to 30 minutes about 3 times a day  Plan: I will confirm with GI that PCSK9 use is okay and we will then submit for prior authorization for Repatha   Patient will resume taking amlodipine  and metoprolol   Thank you,  Kayle Passarelli D Azure Barrales, Pharm.JONETTA SARAN, CPP [facility] of [facility] [address] [place], [place] [address]  Phone: [phone]; Fax: [phone]

## 2024-01-07 NOTE — Patient Instructions (Signed)

## 2024-01-09 ENCOUNTER — Encounter: Payer: Self-pay | Admitting: Family Medicine

## 2024-01-10 ENCOUNTER — Telehealth: Payer: Self-pay

## 2024-01-10 ENCOUNTER — Other Ambulatory Visit (HOSPITAL_COMMUNITY): Payer: Self-pay

## 2024-01-10 NOTE — Telephone Encounter (Signed)
-----   Message from Marcus Hart sent at 01/07/2024  4:35 PM EDT ----- Please do prior authorization for Repatha , patient had significantly elevated LFTs to rosuvastatin  and ezetimibe

## 2024-01-10 NOTE — Telephone Encounter (Signed)
 Pharmacy Patient Advocate Encounter   Received notification from Physician's Office that prior authorization for REPATHA  is required/requested.   Insurance verification completed.   The patient is insured through [employer] .   Per test claim: PA required; PA submitted to above mentioned insurance via Latent Key/confirmation #/EOC [id number] Status is pending

## 2024-01-11 ENCOUNTER — Ambulatory Visit: Payer: Self-pay | Admitting: Family Medicine

## 2024-01-11 ENCOUNTER — Telehealth (HOSPITAL_COMMUNITY): Payer: Self-pay

## 2024-01-11 NOTE — Telephone Encounter (Signed)
  DENIED DUE TO NOT BEING COUNSELED TO STOP SMOKING? RESUBMITTING

## 2024-01-11 NOTE — Telephone Encounter (Signed)
 Pharmacy Patient Advocate Encounter   Received notification from Physician's Office that prior authorization for REPATHA  is required/requested.   Insurance verification completed.   The patient is insured through [facility] .   Per test claim: PA required; PA submitted to above mentioned insurance via Latent Key/confirmation #/EOC [id number] Status is pending

## 2024-01-11 NOTE — Telephone Encounter (Signed)
 Pt called wanting to scheduling for cardiac rehab, I advised pt that we are waiting for his cardiologist office to complete the prior authorization through his insurance for cardiac rehab. Pt stated that he is going to call his cardiologist office to check on the prior auth

## 2024-01-13 ENCOUNTER — Other Ambulatory Visit: Payer: Self-pay

## 2024-01-13 ENCOUNTER — Other Ambulatory Visit (HOSPITAL_COMMUNITY): Payer: Self-pay

## 2024-01-13 MED ORDER — REPATHA SURECLICK 140 MG/ML ~~LOC~~ SOAJ
1.0000 mL | SUBCUTANEOUS | 11 refills | Status: AC
  Filled 2024-01-13: qty 2, 28d supply, fill #0
  Filled 2024-02-11: qty 2, 28d supply, fill #1
  Filled 2024-03-24: qty 2, 28d supply, fill #2
  Filled 2024-04-07 – 2024-04-21 (×2): qty 2, 28d supply, fill #3
  Filled 2024-05-16: qty 2, 28d supply, fill #4
  Filled 2024-06-02 – 2024-06-08 (×2): qty 2, 28d supply, fill #5

## 2024-01-13 NOTE — Telephone Encounter (Signed)
 Pharmacy Patient Advocate Encounter  Received notification from [employer] that Prior Authorization for REPATHA  has been APPROVED from 01/12/24 to 04/13/24. Ran test claim, Copay is $40. This test claim was processed through [facility]- copay amounts may vary at other pharmacies due to pharmacy/plan contracts, or as the patient moves through the different stages of their insurance plan.

## 2024-01-13 NOTE — Telephone Encounter (Addendum)
 Patient made aware of approval. GI (Dr. Charlanne) approved use of medication as well.  Will send to [employer].  I will ask my team to get him a co-pay card and put in [place].

## 2024-01-13 NOTE — Addendum Note (Signed)
 Addended by: Giovoni Bunch D on: 01/13/2024 04:48 PM   Modules accepted: Orders

## 2024-01-14 ENCOUNTER — Telehealth: Payer: Self-pay | Admitting: Pharmacy Technician

## 2024-01-14 ENCOUNTER — Other Ambulatory Visit: Payer: Self-pay

## 2024-01-14 ENCOUNTER — Other Ambulatory Visit (HOSPITAL_COMMUNITY): Payer: Self-pay

## 2024-01-14 NOTE — Telephone Encounter (Signed)
 Copay card obtained and put in [place]:

## 2024-01-14 NOTE — Telephone Encounter (Signed)
 OV scheduled

## 2024-01-18 ENCOUNTER — Other Ambulatory Visit (INDEPENDENT_AMBULATORY_CARE_PROVIDER_SITE_OTHER)

## 2024-01-18 DIAGNOSIS — R7989 Other specified abnormal findings of blood chemistry: Secondary | ICD-10-CM | POA: Diagnosis not present

## 2024-01-18 DIAGNOSIS — M109 Gout, unspecified: Secondary | ICD-10-CM

## 2024-01-18 LAB — HEPATIC FUNCTION PANEL
ALT: 16 U/L (ref 0–53)
AST: 17 U/L (ref 0–37)
Albumin: 4.4 g/dL (ref 3.5–5.2)
Alkaline Phosphatase: 75 U/L (ref 39–117)
Bilirubin, Direct: 0.1 mg/dL (ref 0.0–0.3)
Total Bilirubin: 0.5 mg/dL (ref 0.2–1.2)
Total Protein: 7.4 g/dL (ref 6.0–8.3)

## 2024-01-19 ENCOUNTER — Ambulatory Visit (INDEPENDENT_AMBULATORY_CARE_PROVIDER_SITE_OTHER): Admitting: Gastroenterology

## 2024-01-19 ENCOUNTER — Encounter: Payer: Self-pay | Admitting: Gastroenterology

## 2024-01-19 VITALS — BP 90/52 | HR 87 | Ht 69.0 in | Wt 164.8 lb

## 2024-01-19 DIAGNOSIS — Z794 Long term (current) use of insulin: Secondary | ICD-10-CM | POA: Diagnosis not present

## 2024-01-19 DIAGNOSIS — I214 Non-ST elevation (NSTEMI) myocardial infarction: Secondary | ICD-10-CM | POA: Diagnosis not present

## 2024-01-19 DIAGNOSIS — R748 Abnormal levels of other serum enzymes: Secondary | ICD-10-CM

## 2024-01-19 DIAGNOSIS — E1165 Type 2 diabetes mellitus with hyperglycemia: Secondary | ICD-10-CM | POA: Diagnosis not present

## 2024-01-19 DIAGNOSIS — R1013 Epigastric pain: Secondary | ICD-10-CM

## 2024-01-19 NOTE — Progress Notes (Signed)
 Chief Complaint: Hart follow-up  Primary GI Doctor:Dr. Legrand  HPI: Marcus Hart is a 58 y.o. male with history of diabetes mellitus, hyperlipidemia, hypertension and coronary artery disease who is status post CABG June 2025 after STEMI. He is on aspirin  and Plavix .  Patient presented to the emergency room at [facility] 12/30/2023 with complaint of acute epigastric pain. Apparently he had come to the emergency room at [facility] that same day related to 10 out of 10 pain and had initial cardiac evaluation which were unremarkable. He left without being seen by a provider.   He underwent CT of the abdomen and pelvis with contrast that showed multiple sternal wires, no focal liver abnormality, mild diffuse gallbladder wall thickening without evidence of gallstones normal-appearing stomach and mildly enlarged prostate.   He was admitted and then underwent ultrasound 8/15 which shows incompletely distended gallbladder accentuating the degree of wall thickening and pericholecystic fluid no definite gallstones, CBD 4 mm no liver lesions and portal vein patent.   Initial labs on 12/30/2023 showed T. bili 1.5/alk phos 154/AST 524/ALT 299/lipase 65 WBC 6.8/hemoglobin 11.9/hematocrit 38.6/platelets 199   Acute hepatitis panel negative, acetaminophen  negative, EtOH negative  He and his wife are concerned about medications causing problems.  He had been on Crestor  previously but dose was increased on 12/20/2023 from 5 mg/day to 40 mg/day and he also was just started on Zetia  at that same time for risk modification.   He has not had any prior history of liver issues.  HIDA scan does show filling of the gallbladder, no evidence for acute cholecystitis    Interval History   Patient presents for follow-up after recent hospitalization. He reports he has not had epigastric pain since hospitalization. Patient denies nausea, vomiting, or weight loss. No blood in stool. No dark tarry stools.   Patient denies altered bowel habits, abdominal pain, or rectal bleeding.  Patient has been off Crestor  and Zetia . He was started the Repatha  and received one injection last week, due biweekly.   Wt Readings from Last 3 Encounters:  01/19/24 164 lb 12.8 oz (74.8 kg)  01/06/24 164 lb 9.6 oz (74.7 kg)  12/30/23 164 lb (74.4 kg)    Past Medical History:  Diagnosis Date   Diabetes mellitus (HCC)    Diabetes mellitus without complication (HCC)    Phreesia 11/10/2019   Hyperlipidemia    STEMI (ST elevation myocardial infarction) [facility])    Past Surgical History:  Procedure Laterality Date   COLONOSCOPY     CORONARY ARTERY BYPASS GRAFT N/A 11/08/2023   Procedure: CORONARY ARTERY BYPASS GRAFTING X TWO USING LEFT INTERNAL MAMMARY ARTERY AND LEFT RADIAL ARTERY;  Surgeon: Kerrin Elspeth BROCKS, MD;  Location: [facility] OR;  Service: Open Heart Surgery;  Laterality: N/A;   CORONARY PRESSURE/FFR STUDY N/A 11/04/2023   Procedure: CORONARY PRESSURE/FFR STUDY;  Surgeon: Elmira Newman PARAS, MD;  Location: [facility] INVASIVE CV LAB;  Service: Cardiovascular;  Laterality: N/A;   CORONARY ULTRASOUND/IVUS N/A 11/04/2023   Procedure: Coronary Ultrasound/IVUS;  Surgeon: Elmira Newman PARAS, MD;  Location: [facility] INVASIVE CV LAB;  Service: Cardiovascular;  Laterality: N/A;   INTRAOPERATIVE TRANSESOPHAGEAL ECHOCARDIOGRAM N/A 11/08/2023   Procedure: ECHOCARDIOGRAM, TRANSESOPHAGEAL, INTRAOPERATIVE;  Surgeon: Kerrin Elspeth BROCKS, MD;  Location: [facility] OR;  Service: Open Heart Surgery;  Laterality: N/A;   LEFT HEART CATH AND CORONARY ANGIOGRAPHY N/A 11/04/2023   Procedure: LEFT HEART CATH AND CORONARY ANGIOGRAPHY;  Surgeon: Elmira Newman PARAS, MD;  Location: [facility] INVASIVE CV LAB;  Service: Cardiovascular;  Laterality: N/A;   NO PAST SURGERIES     RADIAL ARTERY HARVEST Left 11/08/2023   Procedure: SURGICAL PROCUREMENT, ARTERY, RADIAL;  Surgeon: Kerrin Elspeth BROCKS, MD;  Location: [facility] OR;  Service: Open Heart Surgery;  Laterality: Left;    UPPER GASTROINTESTINAL ENDOSCOPY      Current Outpatient Medications  Medication Sig Dispense Refill   amLODipine  (NORVASC ) 5 MG tablet Take 1 tablet (5 mg total) by mouth daily. 30 tablet 1   aspirin  EC 81 MG tablet Take 81 mg by mouth daily. Swallow whole.     clopidogrel  (PLAVIX ) 75 MG tablet Take 1 tablet (75 mg total) by mouth daily. 90 tablet 3   Dulaglutide  (TRULICITY ) 3 MG/0.5ML SOAJ Inject 3 mg into the skin once a week. 6 mL 1   empagliflozin  (JARDIANCE ) 25 MG TABS tablet Take 1 tablet (25 mg total) by mouth daily before breakfast. 90 tablet 1   Evolocumab  (REPATHA  SURECLICK) 140 MG/ML SOAJ Inject 140 mg into the skin every 14 (fourteen) days. 2 mL 11   Lancets (ONETOUCH DELICA PLUS LANCET33G) MISC USE AS DIRECTED TO TEST BLOOD GLUCOSE 100 each 1   metFORMIN  (GLUCOPHAGE ) 1000 MG tablet Take 1 tablet (1,000 mg total) by mouth 2 (two) times daily with a meal. 180 tablet 1   metoprolol  tartrate (LOPRESSOR ) 25 MG tablet Take 1/2 tablets (12.5 mg total) by mouth 2 (two) times daily. 180 tablet 3   Multiple Vitamins-Minerals (MULTIVITAMIN WITH MINERALS) tablet Take 1 tablet by mouth daily.     nitroGLYCERIN  (NITROSTAT ) 0.4 MG SL tablet Place 1 tablet (0.4 mg total) under the tongue every 5 (five) minutes as needed for chest pain. If you need more than 3 doses in 24 hours, call 911 or go to nearest ER 25 tablet 3   predniSONE  (DELTASONE ) 20 MG tablet Take 1 tablet (20 mg total) by mouth daily in case of gout flare-must follow-up with primary care physician if does not resolve with this (Patient taking differently: Take 20 mg by mouth as needed.) 3 tablet 0   Study - SOS-AMI - selatogrel 16 mg/0.5 mL or placebo SQ injection (PI-Christopher) Inject 0.5 mLs (16 mg total) into the skin as needed (As needed for for symptoms of a heart attack (chest pain).). 1 mL 0   Insulin  Pen Needle (PEN NEEDLES) 32G X 4 MM MISC Use with Victoza  to inject once daily 90 each 3   No current facility-administered  medications for this visit.    Allergies as of 01/19/2024 - Review Complete 01/19/2024  Allergen Reaction Noted   Pork-derived products Other (See Comments) 11/09/2023    Family History  Problem Relation Age of Onset   Diabetes Mother    Bladder Cancer Mother    Hypertension Mother    Ulcers Father    Benign prostatic hyperplasia Father    Colon cancer Neg Hx    Colon polyps Neg Hx    Esophageal cancer Neg Hx    Rectal cancer Neg Hx    Stomach cancer Neg Hx     Review of Systems:    Constitutional: No weight loss, fever, chills, weakness or fatigue HEENT: Eyes: No change in vision               Ears, Nose, Throat:  No change in hearing or congestion Skin: No rash or itching Cardiovascular: No chest pain, chest pressure or palpitations   Respiratory: No SOB or cough Gastrointestinal: See HPI and otherwise negative Genitourinary: No dysuria or change in urinary  frequency Neurological: No headache, dizziness or syncope Musculoskeletal: No new muscle or joint pain Hematologic: No bleeding or bruising Psychiatric: No history of depression or anxiety    Physical Exam:  Vital signs: BP (!) 90/52   Pulse 87   Ht 5' 9 (1.753 m)   Wt 164 lb 12.8 oz (74.8 kg)   BMI 24.34 kg/m   Constitutional:   Pleasant male appears to be in NAD, Well developed, Well nourished, alert and cooperative Throat: Oral cavity and pharynx without inflammation, swelling or lesion.  Respiratory: Respirations even and unlabored. Lungs clear to auscultation bilaterally.   No wheezes, crackles, or rhonchi.  Cardiovascular: Normal S1, S2. Regular rate and rhythm. No peripheral edema, cyanosis or pallor.  Gastrointestinal:  Soft, nondistended, nontender. No rebound or guarding. Normal bowel sounds. No appreciable masses or hepatomegaly. Rectal:  Not performed.  Msk:  Symmetrical without gross deformities. Without edema, no deformity or joint abnormality.  Neurologic:  Alert and  oriented x4;  grossly  normal neurologically.  Skin:   Dry and intact without significant lesions or rashes.  RELEVANT LABS AND IMAGING: CBC    Latest Ref Rng & Units 01/06/2024   12:26 PM 01/02/2024    7:04 AM 01/01/2024    7:52 AM  CBC  WBC 4.0 - 10.5 K/uL 3.1  2.7  2.4   Hemoglobin 13.0 - 17.0 g/dL 88.0  88.5  88.4   Hematocrit 39.0 - 52.0 % 37.4  36.3  36.4   Platelets 150.0 - 400.0 K/uL 176.0  169  167      CMP     Latest Ref Rng & Units 01/18/2024    9:27 AM 01/06/2024   12:26 PM 01/02/2024    7:04 AM  CMP  Glucose 70 - 99 mg/dL  89  825   BUN 6 - 23 mg/dL  18  12   Creatinine 9.59 - 1.50 mg/dL  9.30  9.28   Sodium 864 - 145 mEq/L  137  137   Potassium 3.5 - 5.1 mEq/L  4.3  4.1   Chloride 96 - 112 mEq/L  102  104   CO2 19 - 32 mEq/L  25  22   Calcium  8.4 - 10.5 mg/dL  9.3  9.0   Total Protein 6.0 - 8.3 g/dL 7.4  6.9  6.2   Total Bilirubin 0.2 - 1.2 mg/dL 0.5  0.5  0.8   Alkaline Phos 39 - 117 U/L 75  97  124   AST 0 - 37 U/L 17  17  81   ALT 0 - 53 U/L 16  74  235      Lab Results  Component Value Date   TSH 1.231 11/04/2023    Lab Results  Component Value Date   LIPASE 22.0 01/06/2024   GI procedures: 02/06/21 EGD 1. Surgical [P], gastric antrum - GASTRIC ANTRAL MUCOSA WITH CHRONIC GASTRITIS - SEE COMMENT 2. Surgical [P], gastric body - GASTRIC OXYNTIC MUCOSA WITH CHRONIC GASTRITIS - SEE COMMENT 10/27/2017 colonoscopy One 3 mm polyp in the rectum, removed with a cold biopsy forceps. Resected and retrieved. - The examination was otherwise normal on direct and retroflexion views. Path: Diagnosis Surgical [P], rectum, polyp - INFLAMMATORY POLYP (2 OF 2 FRAGMENTS) - NO HIGH GRADE DYSPLASIA OR MALIGNANCY IDENTIFIED  Imaging:  01/01/24 NM hepatobiliary IMPRESSION: 1. The common bile duct and cystic duct are patent. No acute cholecystitis. 12/31/23 US  Abd RUQ IMPRESSION: Gallbladder wall thickening and pericholecystic fluid suspicious for acute cholecystitis.  No gallstones are  seen. The overall appearance is similar to that seen on prior CT.  12/30/23 CTAP IMPRESSION: 1. Mild diffuse gallbladder wall thickening without evidence of gallstones or pericholecystic inflammatory fat stranding. Further evaluation with a right upper quadrant ultrasound is recommended if acute cholecystitis is of clinical concern. 2. Mildly enlarged prostate gland. Correlation with PSA levels is recommended.  01/01/24 MRI/MRCP IMPRESSION: 1. Partially decompressed gallbladder with gallbladder wall thickening measuring 4 mm. The mildly thickened gallbladder is nonspecific and Marcus Hart reflect incomplete distention. Although this Marcus Hart be seen with acute and chronic cholecystitis, inflammatory or infectious hepatitis Marcus Hart also account for this finding. 2. Hemangioma in segment 2 of the left lobe of the liver measuring 1.5 cm. No suspicious liver lesion. 3. Simple-appearing cyst in the medial cortex of the left mid kidney measuring 1 cm. No follow-up imaging recommended.   Assessment/Plan: Encounter Diagnoses  Name Primary?   Elevated liver enzymes Yes   Epigastric pain    NSTEMI (non-ST elevated myocardial infarction) (HCC)    Type 2 diabetes mellitus with hyperglycemia, without long-term current use of insulin  Marcus Hart)     #73 58 year old male with coronary artery disease status post STEMI 10/2023 now status post CABG 10/2023 --On aspirin  and Plavix  #2 diabetes mellitus  #3.acute episode of severe epigastric pain #4  Elevated LFT's- resolved Ultrasound with incompletely distended gallbladder accentuating the degree of pericholecystic fluid and wall thickening still concerning for cholecystitis, CBD normal.  HIDA scan negative.  neg Tylenol  level.  No alcohol. Negative acute hep panel. General surgery saw the patient and felt he could follow-up in the office if needed but no acute workup of this in the inpatient surgery no surgery. Suspected to be DILI-Zetia , high-dose Crestor , Patient taken  off both medications and started patient on PCSK9 Repatha  which unlike statins does not rely on the hepatic metabolism so direct liver toxicity is unlikely.  Will defer to Dr. Legrand if he has any reservation to patient continuing medication. LFTs back to tomorrow on yesterday's labs. No abd pain. -defer follow-up lab work to Dr. Legrand  #6 Recall colonoscopy 10/2027    Thank you for the courtesy of this consult. Please call me with any questions or concerns.   Denya Buckingham, FNP-C [facility] 01/19/2024, 4:19 PM  Cc: Marcus Reyes SAUNDERS, MD

## 2024-01-19 NOTE — Patient Instructions (Addendum)
 Elevated liver enzymes Your livers are back to normal, this is good news! If you start to develop epigastric pain again please let the office know. I will discuss with Dr. Legrand if you need any further lab work. We will contact you to schedule.  _______________________________________________________  If your blood pressure at your visit was 140/90 or greater, please contact your primary care physician to follow up on this.  _______________________________________________________  If you are age 56 or older, your body mass index should be between 23-30. Your Body mass index is 24.34 kg/m. If this is out of the aforementioned range listed, please consider follow up with your Primary Care Provider.  If you are age 43 or younger, your body mass index should be between 19-25. Your Body mass index is 24.34 kg/m. If this is out of the aformentioned range listed, please consider follow up with your Primary Care Provider.   ________________________________________________________  The [facility] providers would like to encourage you to use MYCHART to communicate with providers for non-urgent requests or questions.  Due to long hold times on the telephone, sending your provider a message by [facility] may be a faster and more efficient way to get a response.  Please allow 48 business hours for a response.  Please remember that this is for non-urgent requests.  _______________________________________________________  [facility] is using a team-based approach to care.  Your team is made up of your doctor and two to three APPS. Our APPS (Nurse Practitioners and Physician Assistants) work with your physician to ensure care continuity for you. They are fully qualified to address your health concerns and develop a treatment plan. They communicate directly with your gastroenterologist to care for you. Seeing the Advanced Practice Practitioners on your physician's team can help you by facilitating  care more promptly, often allowing for earlier appointments, access to diagnostic testing, procedures, and other specialty referrals.   Thank you for trusting me with your gastrointestinal care. Deanna May, FNP-C

## 2024-01-19 NOTE — Telephone Encounter (Signed)
 Pt called in asking for an update on prior auth for cardiac rehab. Please advise.

## 2024-01-21 ENCOUNTER — Telehealth (HOSPITAL_COMMUNITY): Payer: Self-pay

## 2024-01-21 NOTE — Telephone Encounter (Signed)
 Patient's wife called asking about status of getting scheduled, informed her we still have not heard from Dr. Margurite office regarding prior auth request despite having started requesting this on 8/08 and following up multiple times with the office. She stated they are very upset that it has taken so long to try to get scheduled when his event was back in June and they have diligently called and tried to get scheduled immediately afterwards.  Called Dr. Margurite office again to get status on prior auth request. Neither Dr. Wonda nor his nurse are in office today, left a message with [facility] receptionist. She stated she is sending a message directly to his nurse, [facility], so hopefully this will be handled next week.

## 2024-01-21 NOTE — Telephone Encounter (Signed)
 Hailey from Cardiac Rehab is calling back because they have not heard anything from us  about Cardiac Rehab. She said patient is upset because he has been trying  to go to Cardiac Rehab since 12/24/23. I will send this directly to his nurse as well as triage.

## 2024-01-24 ENCOUNTER — Telehealth (HOSPITAL_COMMUNITY): Payer: Self-pay

## 2024-01-24 NOTE — Telephone Encounter (Signed)
 Marcus Hart's wife called Friday to check status of referral, informed her we are still waiting on prior auth request to be completed by Dr. Margurite office. Notes below reflect the conversation I had with [facility] receptionist as neither Dr. Wonda nor his nurse were in office Friday.  Tereso called this morning to also check on status of referral, informed him we are still waiting for the prior auth and we will check in with Dr. Margurite office again today to make sure they are completing this.

## 2024-01-25 ENCOUNTER — Telehealth (HOSPITAL_COMMUNITY): Payer: Self-pay

## 2024-01-25 NOTE — Telephone Encounter (Signed)
 Pt obtained new insurance other than [employer] which required a prior auth for cardiac rehab. Pt's new insurance [facility] Medicaid does not require prior auth for cardiac rehab. Proceeded with scheduling.

## 2024-01-25 NOTE — Telephone Encounter (Signed)
 Pt insurance is active and benefits verified through [facility] Co-pay $4, DED 0/0 met, out of pocket 0/0 met, co-insurance 0%. no pre-authorization required for TCR ONLY, Deja/[employer] 01/25/2024@3 :38, REF# [id number]  TCR/ICR? TCR ONLY Visit(date of service)limitation? No limit Can multiple codes be used on the same date of service/visit?(IF ITS A LIMIT) n/a  Is this a lifetime maximum or an annual maximum? annual Has the member used any of these services to date? no Is there a time limit (weeks/months) on start of program and/or program completion? no

## 2024-01-25 NOTE — Telephone Encounter (Signed)
 Pt will come in for cardiac rehab orientation on 9/11@1045  and will attend the 6:45am exercise class time.   Sent packet

## 2024-01-25 NOTE — Telephone Encounter (Signed)
 Calling back about the prior auth that is needed for rehab. Please advise

## 2024-01-26 NOTE — Progress Notes (Signed)
 ____________________________________________________________  Attending physician addendum:  Thank you for sending this case to me. I have reviewed the entire note and agree with the plan.  Excellent comprehensive review on a complex issue.  With a negative acute viral hepatitis panel, I agree that this overall picture looks most like drug-induced liver injury that has now resolved with normalization of LFTs.  The overall clinical picture favors to be nonspecific gallbladder wall thickening having been related to the hepatitis.  While we cannot know for sure, the statin was a more likely culprit than the Zetia .  Repatha  is also not known to commonly cause this, so that seems like a better alternative in my opinion.  Having seen the liver labs returned to normal and his symptoms resolved, I do not think he needs any further GI/hepatology testing at this point.  Victory Brand, MD  ____________________________________________________________

## 2024-01-27 ENCOUNTER — Ambulatory Visit: Payer: Self-pay | Admitting: Gastroenterology

## 2024-01-27 ENCOUNTER — Telehealth: Payer: Self-pay

## 2024-01-27 ENCOUNTER — Encounter (HOSPITAL_COMMUNITY)
Admission: RE | Admit: 2024-01-27 | Discharge: 2024-01-27 | Disposition: A | Discharge status: Home / Self Care | Source: Ambulatory Visit | Attending: Cardiovascular Disease | Admitting: Cardiovascular Disease

## 2024-01-27 VITALS — BP 110/70 | HR 60 | Ht 69.0 in | Wt 165.6 lb

## 2024-01-27 DIAGNOSIS — I214 Non-ST elevation (NSTEMI) myocardial infarction: Secondary | ICD-10-CM

## 2024-01-27 DIAGNOSIS — Z48812 Encounter for surgical aftercare following surgery on the circulatory system: Secondary | ICD-10-CM | POA: Diagnosis present

## 2024-01-27 DIAGNOSIS — Z951 Presence of aortocoronary bypass graft: Secondary | ICD-10-CM | POA: Insufficient documentation

## 2024-01-27 NOTE — Progress Notes (Signed)
 Cardiac Individual Treatment Plan  Patient Details  Name: Marcus Hart MRN: [id number] Date of Birth: Jul 29, 1965 Referring Provider:   Flowsheet Row CARDIAC REHAB PHASE II ORIENTATION from 01/27/2024 in [facility] for Heart, Vascular, [facility]  Referring Provider Dr. Ozell Fell NP    Initial Encounter Date:  Flowsheet Row CARDIAC REHAB PHASE II ORIENTATION from 01/27/2024 in [facility] for Heart, Vascular, & Lung Health  Date 01/27/24    Visit Diagnosis: S/P CABG x 2  NSTEMI (non-ST elevated myocardial infarction) [facility])  Patient's Home Medications on Admission:  Current Outpatient Medications:    amLODipine  (NORVASC ) 5 MG tablet, Take 1 tablet (5 mg total) by mouth daily. (Patient taking differently: Take 5 mg by mouth daily. Cardiologist is now aware patient is not taking medication. He took himself off this medication), Disp: 30 tablet, Rfl: 1   aspirin  EC 81 MG tablet, Take 81 mg by mouth daily. Swallow whole., Disp: , Rfl:    clopidogrel  (PLAVIX ) 75 MG tablet, Take 1 tablet (75 mg total) by mouth daily., Disp: 90 tablet, Rfl: 3   Dulaglutide  (TRULICITY ) 3 MG/0.5ML SOAJ, Inject 3 mg into the skin once a week., Disp: 6 mL, Rfl: 1   empagliflozin  (JARDIANCE ) 25 MG TABS tablet, Take 1 tablet (25 mg total) by mouth daily before breakfast., Disp: 90 tablet, Rfl: 1   Evolocumab  (REPATHA  SURECLICK) 140 MG/ML SOAJ, Inject 140 mg into the skin every 14 (fourteen) days., Disp: 2 mL, Rfl: 11   Insulin  Pen Needle (PEN NEEDLES) 32G X 4 MM MISC, Use with Victoza  to inject once daily, Disp: 90 each, Rfl: 3   Lancets (ONETOUCH DELICA PLUS LANCET33G) MISC, USE AS DIRECTED TO TEST BLOOD GLUCOSE, Disp: 100 each, Rfl: 1   metFORMIN  (GLUCOPHAGE ) 1000 MG tablet, Take 1 tablet (1,000 mg total) by mouth 2 (two) times daily with a meal., Disp: 180 tablet, Rfl: 1   metoprolol  tartrate (LOPRESSOR ) 25 MG tablet, Take 1/2 tablets (12.5 mg  total) by mouth 2 (two) times daily., Disp: 180 tablet, Rfl: 3   Multiple Vitamins-Minerals (MULTIVITAMIN WITH MINERALS) tablet, Take 1 tablet by mouth daily., Disp: , Rfl:    nitroGLYCERIN  (NITROSTAT ) 0.4 MG SL tablet, Place 1 tablet (0.4 mg total) under the tongue every 5 (five) minutes as needed for chest pain. If you need more than 3 doses in 24 hours, call 911 or go to nearest ER, Disp: 25 tablet, Rfl: 3   predniSONE  (DELTASONE ) 20 MG tablet, Take 1 tablet (20 mg total) by mouth daily in case of gout flare-must follow-up with primary care physician if does not resolve with this, Disp: 3 tablet, Rfl: 0   Study - SOS-AMI - selatogrel 16 mg/0.5 mL or placebo SQ injection (PI-Christopher), Inject 0.5 mLs (16 mg total) into the skin as needed (As needed for for symptoms of a heart attack (chest pain).)., Disp: 1 mL, Rfl: 0  Past Medical History: Past Medical History:  Diagnosis Date   Diabetes mellitus (HCC)    Diabetes mellitus without complication (HCC)    [employer]reesia 11/10/2019   Hyperlipidemia    STEMI (ST elevation myocardial infarction) (HCC)     Tobacco Use: Social History   Tobacco Use  Smoking Status Never  Smokeless Tobacco Never    Labs: Review Flowsheet  More data exists      Latest Ref Rng & Units 11/07/2023 11/08/2023 11/09/2023 12/20/2023 01/01/2024  Labs for ITP Cardiac and Pulmonary Rehab  Cholestrol 100 -  199 mg/dL - - - 852  -  LDL (calc) 0 - 99 mg/dL - - - 79  -  HDL-C >60 mg/dL - - - 55  -  Trlycerides 0 - 149 mg/dL - - - 67  -  Hemoglobin A1c 4.8 - 5.6 % - - - - 7.0   PH, Arterial 7.35 - 7.45 7.42  7.280  7.420  7.430  7.446  7.399  7.367  7.294  - -  PCO2 arterial 32 - 48 mmHg 42  42.9  32.2  32.5  32.7  36.5  38.7  43.7  - -  Bicarbonate 20.0 - 28.0 mmol/L 27.2  20.0  21.1  21.6  22.5  24.4  22.6  22.2  21.0  - -  TCO2 22 - 32 mmol/L - 21  22  23  25  23  24  26  24  23  23  27  22   - -  Acid-base deficit 0.0 - 2.0 mmol/L - 6.0  3.0  2.0  1.0  2.0  3.0  5.0  - -   O2 Saturation % 98.8  99  100  100  100  78  100  100  99  - -    Details       Multiple values from one day are sorted in reverse-chronological order         Capillary Blood Glucose: Lab Results  Component Value Date   GLUCAP 164 (H) 01/02/2024   GLUCAP 149 (H) 01/02/2024   GLUCAP 106 (H) 01/02/2024   GLUCAP 123 (H) 01/01/2024   GLUCAP 155 (H) 01/01/2024     Exercise Target Goals: Exercise Program Goal: Individual exercise prescription set using results from initial 6 min walk test and THRR while considering  patient's activity barriers and safety.   Exercise Prescription Goal: Initial exercise prescription builds to 30-45 minutes a day of aerobic activity, 2-3 days per week.  Home exercise guidelines will be given to patient during program as part of exercise prescription that the participant will acknowledge.  Activity Barriers & Risk Stratification:  Activity Barriers & Cardiac Risk Stratification - 01/27/24 1407       Activity Barriers & Cardiac Risk Stratification   Activity Barriers Deconditioning    Cardiac Risk Stratification High   Under 5 MET on walk test         6 Minute Walk:  6 Minute Walk     Row Name 01/27/24 1405         6 Minute Walk   Phase Initial     Distance 1370 feet     Walk Time 6 minutes     # of Rest Breaks 0     MPH 2.5     METS 2.78     RPE 8     Perceived Dyspnea  0     VO2 Peak 9.73     Symptoms Yes (comment)     Comments Pre-orthostatic sitting 110/70 standing 100/62     Resting HR 60 bpm     Resting BP 110/70     Resting Oxygen Saturation  100 %     Exercise Oxygen Saturation  during 6 min walk 100 %     Max Ex. HR 83 bpm     Max Ex. BP 124/68     2 Minute Post BP 114/62        Oxygen Initial Assessment:   Oxygen Re-Evaluation:   Oxygen Discharge (Final Oxygen Re-Evaluation):  Initial Exercise Prescription:  Initial Exercise Prescription - 01/27/24 1400       Date of Initial Exercise RX and  Referring Provider   Date 01/27/24    Referring Provider Dr. Ozell Fell NP    Expected Discharge Date 03/08/24      Recumbant Bike   Level 2    RPM 50    Watts 15    Minutes 15    METs 2.5      NuStep   Level 2    SPM 75    Minutes 15    METs 1.9      Prescription Details   Frequency (times per week) 3    Duration Progress to 30 minutes of continuous aerobic without signs/symptoms of physical distress      Intensity   THRR 40-80% of Max Heartrate 65-131    Ratings of Perceived Exertion 11-13    Perceived Dyspnea 0-4      Progression   Progression Continue progressive overload as per policy without signs/symptoms or physical distress.      Resistance Training   Training Prescription Yes    Weight 3    Reps 10-15          Perform Capillary Blood Glucose checks as needed.  Exercise Prescription Changes:   Exercise Comments:   Exercise Goals and Review:   Exercise Goals     Row Name 01/27/24 1410             Exercise Goals   Increase Physical Activity Yes       Intervention Provide advice, education, support and counseling about physical activity/exercise needs.;Develop an individualized exercise prescription for aerobic and resistive training based on initial evaluation findings, risk stratification, comorbidities and participant's personal goals.       Expected Outcomes Short Term: Attend rehab on a regular basis to increase amount of physical activity.;Long Term: Exercising regularly at least 3-5 days a week.;Long Term: Add in home exercise to make exercise part of routine and to increase amount of physical activity.       Increase Strength and Stamina Yes       Intervention Provide advice, education, support and counseling about physical activity/exercise needs.;Develop an individualized exercise prescription for aerobic and resistive training based on initial evaluation findings, risk stratification, comorbidities and participant's personal goals.        Expected Outcomes Short Term: Increase workloads from initial exercise prescription for resistance, speed, and METs.;Short Term: Perform resistance training exercises routinely during rehab and add in resistance training at home;Long Term: Improve cardiorespiratory fitness, muscular endurance and strength as measured by increased METs and functional capacity ( )       Able to understand and use rate of perceived exertion (RPE) scale Yes       Intervention Provide education and explanation on how to use RPE scale       Expected Outcomes Short Term: Able to use RPE daily in rehab to express subjective intensity level;Long Term:  Able to use RPE to guide intensity level when exercising independently       Knowledge and understanding of Target Heart Rate Range (THRR) Yes       Intervention Provide education and explanation of THRR including how the numbers were predicted and where they are located for reference       Expected Outcomes Short Term: Able to state/look up THRR;Long Term: Able to use THRR to govern intensity when exercising independently;Short Term: Able to use daily as guideline for intensity  in rehab       Understanding of Exercise Prescription Yes       Intervention Provide education, explanation, and written materials on patient's individual exercise prescription       Expected Outcomes Short Term: Able to explain program exercise prescription;Long Term: Able to explain home exercise prescription to exercise independently          Exercise Goals Re-Evaluation :   Discharge Exercise Prescription (Final Exercise Prescription Changes):   Nutrition:  Target Goals: Understanding of nutrition guidelines, daily intake of sodium 1500mg , cholesterol 200mg , calories 30% from fat and 7% or less from saturated fats, daily to have 5 or more servings of fruits and vegetables.  Biometrics:  Pre Biometrics - 01/27/24 1400       Pre Biometrics   Waist Circumference 35 inches    Hip  Circumference 38 inches    Waist to Hip Ratio 0.92 %    Triceps Skinfold 16 mm    % Body Fat 23.8 %    Grip Strength 34 kg    Flexibility 14.25 in    Single Leg Stand 30 seconds           Nutrition Therapy Plan and Nutrition Goals:   Nutrition Assessments:  MEDIFICTS Score Key: >=70 Need to make dietary changes  40-70 Heart Healthy Diet <= 40 Therapeutic Level Cholesterol Diet    Picture Your Plate Scores: <59 Unhealthy dietary pattern with much room for improvement. 41-50 Dietary pattern unlikely to meet recommendations for good health and room for improvement. 51-60 More healthful dietary pattern, with some room for improvement.  >60 Healthy dietary pattern, although there may be some specific behaviors that could be improved.    Nutrition Goals Re-Evaluation:   Nutrition Goals Re-Evaluation:   Nutrition Goals Discharge (Final Nutrition Goals Re-Evaluation):   Psychosocial: Target Goals: Acknowledge presence or absence of significant depression and/or stress, maximize coping skills, provide positive support system. Participant is able to verbalize types and ability to use techniques and skills needed for reducing stress and depression.  Initial Review & Psychosocial Screening:  Initial Psych Review & Screening - 01/27/24 1411       Initial Review   Current issues with None Identified      Family Dynamics   Good Support System? Yes   His wife and family are good support. He has no needs at this time     Barriers   Psychosocial barriers to participate in program The patient should benefit from training in stress management and relaxation.      Screening Interventions   Interventions Encouraged to exercise;Provide feedback about the scores to participant    Expected Outcomes Short Term goal: Identification and review with participant of any Quality of Life or Depression concerns found by scoring the questionnaire.;Long Term goal: The participant improves  quality of Life and PHQ9 Scores as seen by post scores and/or verbalization of changes;Long Term Goal: Stressors or current issues are controlled or eliminated.          Quality of Life Scores:  Scores of 19 and below usually indicate a poorer quality of life in these areas.  A difference of  2-3 points is a clinically meaningful difference.  A difference of 2-3 points in the total score of the Quality of Life Index has been associated with significant improvement in overall quality of life, self-image, physical symptoms, and general health in studies assessing change in quality of life.  PHQ-9: Review Flowsheet  More data exists  01/27/2024 01/06/2024 10/01/2023 06/18/2023 02/19/2023  Depression screen PHQ 2/9  Decreased Interest 0 0 0 0 0  Down, Depressed, Hopeless 0 0 0 0 0  PHQ - 2 Score 0 0 0 0 0  Altered sleeping 0 0 0 0 0  Tired, decreased energy 0 2 1 0 1  Change in appetite 0 0 1 0 0  Feeling bad or failure about yourself  0 0 0 0 0  Trouble concentrating 0 0 0 0 0  Moving slowly or fidgety/restless 0 0 0 0 0  Suicidal thoughts 0 0 0 0 0  PHQ-9 Score 0 2 2 0 1  Difficult doing work/chores - Not difficult at all Not difficult at all - -   Interpretation of Total Score  Total Score Depression Severity:  1-4 = Minimal depression, 5-9 = Mild depression, 10-14 = Moderate depression, 15-19 = Moderately severe depression, 20-27 = Severe depression   Psychosocial Evaluation and Intervention:   Psychosocial Re-Evaluation:   Psychosocial Discharge (Final Psychosocial Re-Evaluation):   Vocational Rehabilitation: Provide vocational rehab assistance to qualifying candidates.   Vocational Rehab Evaluation & Intervention:  Vocational Rehab - 01/27/24 1415       Initial Vocational Rehab Evaluation & Intervention   Assessment shows need for Vocational Rehabilitation No   He is on short term disablility through his work.         Education: Education Goals: Education  classes will be provided on a weekly basis, covering required topics. Participant will state understanding/return demonstration of topics presented.     Core Videos: Exercise    Move It!  Clinical staff conducted group or individual video education with verbal and written material and guidebook.  Patient learns the recommended Pritikin exercise program. Exercise with the goal of living a long, healthy life. Some of the health benefits of exercise include controlled diabetes, healthier blood pressure levels, improved cholesterol levels, improved heart and lung capacity, improved sleep, and better body composition. Everyone should speak with their doctor before starting or changing an exercise routine.  Biomechanical Limitations Clinical staff conducted group or individual video education with verbal and written material and guidebook.  Patient learns how biomechanical limitations can impact exercise and how we can mitigate and possibly overcome limitations to have an impactful and balanced exercise routine.  Body Composition Clinical staff conducted group or individual video education with verbal and written material and guidebook.  Patient learns that body composition (ratio of muscle mass to fat mass) is a key component to assessing overall fitness, rather than body weight alone. Increased fat mass, especially visceral belly fat, can put us  at increased risk for metabolic syndrome, type 2 diabetes, heart disease, and even death. It is recommended to combine diet and exercise (cardiovascular and resistance training) to improve your body composition. Seek guidance from your physician and exercise physiologist before implementing an exercise routine.  Exercise Action Plan Clinical staff conducted group or individual video education with verbal and written material and guidebook.  Patient learns the recommended strategies to achieve and enjoy long-term exercise adherence, including variety,  self-motivation, self-efficacy, and positive decision making. Benefits of exercise include fitness, good health, weight management, more energy, better sleep, less stress, and overall well-being.  Medical   Heart Disease Risk Reduction Clinical staff conducted group or individual video education with verbal and written material and guidebook.  Patient learns our heart is our most vital organ as it circulates oxygen, nutrients, white blood cells, and hormones throughout the entire body, and carries  waste away. Data supports a plant-based eating plan like the Pritikin Program for its effectiveness in slowing progression of and reversing heart disease. The video provides a number of recommendations to address heart disease.   Metabolic Syndrome and Belly Fat  Clinical staff conducted group or individual video education with verbal and written material and guidebook.  Patient learns what metabolic syndrome is, how it leads to heart disease, and how one can reverse it and keep it from coming back. You have metabolic syndrome if you have 3 of the following 5 criteria: abdominal obesity, high blood pressure, high triglycerides, low HDL cholesterol, and high blood sugar.  Hypertension and Heart Disease Clinical staff conducted group or individual video education with verbal and written material and guidebook.  Patient learns that high blood pressure, or hypertension, is very common in the [place] . Hypertension is largely due to excessive salt intake, but other important risk factors include being overweight, physical inactivity, drinking too much alcohol, smoking, and not eating enough potassium from fruits and vegetables. High blood pressure is a leading risk factor for heart attack, stroke, congestive heart failure, dementia, kidney failure, and premature death. Long-term effects of excessive salt intake include stiffening of the arteries and thickening of heart muscle and organ damage. Recommendations  include ways to reduce hypertension and the risk of heart disease.  Diseases of Our Time - Focusing on Diabetes Clinical staff conducted group or individual video education with verbal and written material and guidebook.  Patient learns why the best way to stop diseases of our time is prevention, through food and other lifestyle changes. Medicine (such as prescription pills and surgeries) is often only a Band-Aid on the problem, not a long-term solution. Most common diseases of our time include obesity, type 2 diabetes, hypertension, heart disease, and cancer. The Pritikin Program is recommended and has been proven to help reduce, reverse, and/or prevent the damaging effects of metabolic syndrome.  Nutrition   Overview of the Pritikin Eating Plan  Clinical staff conducted group or individual video education with verbal and written material and guidebook.  Patient learns about the Pritikin Eating Plan for disease risk reduction. The Pritikin Eating Plan emphasizes a wide variety of unrefined, minimally-processed carbohydrates, like fruits, vegetables, whole grains, and legumes. Go, Caution, and Stop food choices are explained. Plant-based and lean animal proteins are emphasized. Rationale provided for low sodium intake for blood pressure control, low added sugars for blood sugar stabilization, and low added fats and oils for coronary artery disease risk reduction and weight management.  Calorie Density  Clinical staff conducted group or individual video education with verbal and written material and guidebook.  Patient learns about calorie density and how it impacts the Pritikin Eating Plan. Knowing the characteristics of the food you choose will help you decide whether those foods will lead to weight gain or weight loss, and whether you want to consume more or less of them. Weight loss is usually a side effect of the Pritikin Eating Plan because of its focus on low calorie-dense foods.  Label Reading   Clinical staff conducted group or individual video education with verbal and written material and guidebook.  Patient learns about the Pritikin recommended label reading guidelines and corresponding recommendations regarding calorie density, added sugars, sodium content, and whole grains.  Dining Out - Part 1  Clinical staff conducted group or individual video education with verbal and written material and guidebook.  Patient learns that restaurant meals can be sabotaging because they can be  so high in calories, fat, sodium, and/or sugar. Patient learns recommended strategies on how to positively address this and avoid unhealthy pitfalls.  Facts on Fats  Clinical staff conducted group or individual video education with verbal and written material and guidebook.  Patient learns that lifestyle modifications can be just as effective, if not more so, as many medications for lowering your risk of heart disease. A Pritikin lifestyle can help to reduce your risk of inflammation and atherosclerosis (cholesterol build-up, or plaque, in the artery walls). Lifestyle interventions such as dietary choices and physical activity address the cause of atherosclerosis. A review of the types of fats and their impact on blood cholesterol levels, along with dietary recommendations to reduce fat intake is also included.  Nutrition Action Plan  Clinical staff conducted group or individual video education with verbal and written material and guidebook.  Patient learns how to incorporate Pritikin recommendations into their lifestyle. Recommendations include planning and keeping personal health goals in mind as an important part of their success.  Healthy Mind-Set    Healthy Minds, Bodies, Hearts  Clinical staff conducted group or individual video education with verbal and written material and guidebook.  Patient learns how to identify when they are stressed. Video will discuss the impact of that stress, as well as the  many benefits of stress management. Patient will also be introduced to stress management techniques. The way we think, act, and feel has an impact on our hearts.  How Our Thoughts Can Heal Our Hearts  Clinical staff conducted group or individual video education with verbal and written material and guidebook.  Patient learns that negative thoughts can cause depression and anxiety. This can result in negative lifestyle behavior and serious health problems. Cognitive behavioral therapy is an effective method to help control our thoughts in order to change and improve our emotional outlook.  Additional Videos:  Exercise    Improving Performance  Clinical staff conducted group or individual video education with verbal and written material and guidebook.  Patient learns to use a non-linear approach by alternating intensity levels and lengths of time spent exercising to help burn more calories and lose more body fat. Cardiovascular exercise helps improve heart health, metabolism, hormonal balance, blood sugar control, and recovery from fatigue. Resistance training improves strength, endurance, balance, coordination, reaction time, metabolism, and muscle mass. Flexibility exercise improves circulation, posture, and balance. Seek guidance from your physician and exercise physiologist before implementing an exercise routine and learn your capabilities and proper form for all exercise.  Introduction to Yoga  Clinical staff conducted group or individual video education with verbal and written material and guidebook.  Patient learns about yoga, a discipline of the coming together of mind, breath, and body. The benefits of yoga include improved flexibility, improved range of motion, better posture and core strength, increased lung function, weight loss, and positive self-image. Yoga's heart health benefits include lowered blood pressure, healthier heart rate, decreased cholesterol and triglyceride levels, improved  immune function, and reduced stress. Seek guidance from your physician and exercise physiologist before implementing an exercise routine and learn your capabilities and proper form for all exercise.  Medical   Aging: Enhancing Your Quality of Life  Clinical staff conducted group or individual video education with verbal and written material and guidebook.  Patient learns key strategies and recommendations to stay in good physical health and enhance quality of life, such as prevention strategies, having an advocate, securing a Health Care Proxy and Power of [place], and keeping a  list of medications and system for tracking them. It also discusses how to avoid risk for bone loss.  Biology of Weight Control  Clinical staff conducted group or individual video education with verbal and written material and guidebook.  Patient learns that weight gain occurs because we consume more calories than we burn (eating more, moving less). Even if your body weight is normal, you may have higher ratios of fat compared to muscle mass. Too much body fat puts you at increased risk for cardiovascular disease, heart attack, stroke, type 2 diabetes, and obesity-related cancers. In addition to exercise, following the Pritikin Eating Plan can help reduce your risk.  Decoding Lab Results  Clinical staff conducted group or individual video education with verbal and written material and guidebook.  Patient learns that lab test reflects one measurement whose values change over time and are influenced by many factors, including medication, stress, sleep, exercise, food, hydration, pre-existing medical conditions, and more. It is recommended to use the knowledge from this video to become more involved with your lab results and evaluate your numbers to speak with your doctor.   Diseases of Our Time - Overview  Clinical staff conducted group or individual video education with verbal and written material and guidebook.  Patient  learns that according to the CDC, 50% to 70% of chronic diseases (such as obesity, type 2 diabetes, elevated lipids, hypertension, and heart disease) are avoidable through lifestyle improvements including healthier food choices, listening to satiety cues, and increased physical activity.  Sleep Disorders Clinical staff conducted group or individual video education with verbal and written material and guidebook.  Patient learns how good quality and duration of sleep are important to overall health and well-being. Patient also learns about sleep disorders and how they impact health along with recommendations to address them, including discussing with a physician.  Nutrition  Dining Out - Part 2 Clinical staff conducted group or individual video education with verbal and written material and guidebook.  Patient learns how to plan ahead and communicate in order to maximize their dining experience in a healthy and nutritious manner. Included are recommended food choices based on the type of restaurant the patient is visiting.   Fueling a Banker conducted group or individual video education with verbal and written material and guidebook.  There is a strong connection between our food choices and our health. Diseases like obesity and type 2 diabetes are very prevalent and are in large-part due to lifestyle choices. The Pritikin Eating Plan provides plenty of food and hunger-curbing satisfaction. It is easy to follow, affordable, and helps reduce health risks.  Menu Workshop  Clinical staff conducted group or individual video education with verbal and written material and guidebook.  Patient learns that restaurant meals can sabotage health goals because they are often packed with calories, fat, sodium, and sugar. Recommendations include strategies to plan ahead and to communicate with the manager, chef, or server to help order a healthier meal.  Planning Your Eating Strategy   Clinical staff conducted group or individual video education with verbal and written material and guidebook.  Patient learns about the Pritikin Eating Plan and its benefit of reducing the risk of disease. The Pritikin Eating Plan does not focus on calories. Instead, it emphasizes high-quality, nutrient-rich foods. By knowing the characteristics of the foods, we choose, we can determine their calorie density and make informed decisions.  Targeting Your Nutrition Priorities  Clinical staff conducted group or individual video education with verbal  and written material and guidebook.  Patient learns that lifestyle habits have a tremendous impact on disease risk and progression. This video provides eating and physical activity recommendations based on your personal health goals, such as reducing LDL cholesterol, losing weight, preventing or controlling type 2 diabetes, and reducing high blood pressure.  Vitamins and Minerals  Clinical staff conducted group or individual video education with verbal and written material and guidebook.  Patient learns different ways to obtain key vitamins and minerals, including through a recommended healthy diet. It is important to discuss all supplements you take with your doctor.   Healthy Mind-Set    Smoking Cessation  Clinical staff conducted group or individual video education with verbal and written material and guidebook.  Patient learns that cigarette smoking and tobacco addiction pose a serious health risk which affects millions of people. Stopping smoking will significantly reduce the risk of heart disease, lung disease, and many forms of cancer. Recommended strategies for quitting are covered, including working with your doctor to develop a successful plan.  Culinary   Becoming a Set designer conducted group or individual video education with verbal and written material and guidebook.  Patient learns that cooking at home can be healthy,  cost-effective, quick, and puts them in control. Keys to cooking healthy recipes will include looking at your recipe, assessing your equipment needs, planning ahead, making it simple, choosing cost-effective seasonal ingredients, and limiting the use of added fats, salts, and sugars.  Cooking - Breakfast and Snacks  Clinical staff conducted group or individual video education with verbal and written material and guidebook.  Patient learns how important breakfast is to satiety and nutrition through the entire day. Recommendations include key foods to eat during breakfast to help stabilize blood sugar levels and to prevent overeating at meals later in the day. Planning ahead is also a key component.  Cooking - Educational psychologist conducted group or individual video education with verbal and written material and guidebook.  Patient learns eating strategies to improve overall health, including an approach to cook more at home. Recommendations include thinking of animal protein as a side on your plate rather than center stage and focusing instead on lower calorie dense options like vegetables, fruits, whole grains, and plant-based proteins, such as beans. Making sauces in large quantities to freeze for later and leaving the skin on your vegetables are also recommended to maximize your experience.  Cooking - Healthy Salads and Dressing Clinical staff conducted group or individual video education with verbal and written material and guidebook.  Patient learns that vegetables, fruits, whole grains, and legumes are the foundations of the Pritikin Eating Plan. Recommendations include how to incorporate each of these in flavorful and healthy salads, and how to create homemade salad dressings. Proper handling of ingredients is also covered. Cooking - Soups and [employer] - Soups and Desserts Clinical staff conducted group or individual video education with verbal and written material and  guidebook.  Patient learns that Pritikin soups and desserts make for easy, nutritious, and delicious snacks and meal components that are low in sodium, fat, sugar, and calorie density, while high in vitamins, minerals, and filling fiber. Recommendations include simple and healthy ideas for soups and desserts.   Overview     The Pritikin Solution Program Overview Clinical staff conducted group or individual video education with verbal and written material and guidebook.  Patient learns that the results of the Pritikin Program have been documented in  more than 100 articles published in peer-reviewed journals, and the benefits include reducing risk factors for (and, in some cases, even reversing) high cholesterol, high blood pressure, type 2 diabetes, obesity, and more! An overview of the three key pillars of the Pritikin Program will be covered: eating well, doing regular exercise, and having a healthy mind-set.  WORKSHOPS  Exercise: Exercise Basics: Building Your Action Plan Clinical staff led group instruction and group discussion with PowerPoint presentation and patient guidebook. To enhance the learning environment the use of posters, models and videos may be added. At the conclusion of this workshop, patients will comprehend the difference between physical activity and exercise, as well as the benefits of incorporating both, into their routine. Patients will understand the FITT (Frequency, Intensity, Time, and Type) principle and how to use it to build an exercise action plan. In addition, safety concerns and other considerations for exercise and cardiac rehab will be addressed by the presenter. The purpose of this lesson is to promote a comprehensive and effective weekly exercise routine in order to improve patients' overall level of fitness.   Managing Heart Disease: Your Path to a Healthier Heart Clinical staff led group instruction and group discussion with PowerPoint presentation and  patient guidebook. To enhance the learning environment the use of posters, models and videos may be added.At the conclusion of this workshop, patients will understand the anatomy and physiology of the heart. Additionally, they will understand how Pritikin's three pillars impact the risk factors, the progression, and the management of heart disease.  The purpose of this lesson is to provide a high-level overview of the heart, heart disease, and how the Pritikin lifestyle positively impacts risk factors.  Exercise Biomechanics Clinical staff led group instruction and group discussion with PowerPoint presentation and patient guidebook. To enhance the learning environment the use of posters, models and videos may be added. Patients will learn how the structural parts of their bodies function and how these functions impact their daily activities, movement, and exercise. Patients will learn how to promote a neutral spine, learn how to manage pain, and identify ways to improve their physical movement in order to promote healthy living. The purpose of this lesson is to expose patients to common physical limitations that impact physical activity. Participants will learn practical ways to adapt and manage aches and pains, and to minimize their effect on regular exercise. Patients will learn how to maintain good posture while sitting, walking, and lifting.  Balance Training and Fall Prevention  Clinical staff led group instruction and group discussion with PowerPoint presentation and patient guidebook. To enhance the learning environment the use of posters, models and videos may be added. At the conclusion of this workshop, patients will understand the importance of their sensorimotor skills (vision, proprioception, and the vestibular system) in maintaining their ability to balance as they age. Patients will apply a variety of balancing exercises that are appropriate for their current level of function.  Patients will understand the common causes for poor balance, possible solutions to these problems, and ways to modify their physical environment in order to minimize their fall risk. The purpose of this lesson is to teach patients about the importance of maintaining balance as they age and ways to minimize their risk of falling.  WORKSHOPS   Nutrition:  Fueling a Ship broker led group instruction and group discussion with PowerPoint presentation and patient guidebook. To enhance the learning environment the use of posters, models and videos may be added. Patients will  review the foundational principles of the Pritikin Eating Plan and understand what constitutes a serving size in each of the food groups. Patients will also learn Pritikin-friendly foods that are better choices when away from home and review make-ahead meal and snack options. Calorie density will be reviewed and applied to three nutrition priorities: weight maintenance, weight loss, and weight gain. The purpose of this lesson is to reinforce (in a group setting) the key concepts around what patients are recommended to eat and how to apply these guidelines when away from home by planning and selecting Pritikin-friendly options. Patients will understand how calorie density may be adjusted for different weight management goals.  Mindful Eating  Clinical staff led group instruction and group discussion with PowerPoint presentation and patient guidebook. To enhance the learning environment the use of posters, models and videos may be added. Patients will briefly review the concepts of the Pritikin Eating Plan and the importance of low-calorie dense foods. The concept of mindful eating will be introduced as well as the importance of paying attention to internal hunger signals. Triggers for non-hunger eating and techniques for dealing with triggers will be explored. The purpose of this lesson is to provide patients with the  opportunity to review the basic principles of the Pritikin Eating Plan, discuss the value of eating mindfully and how to measure internal cues of hunger and fullness using the Hunger Scale. Patients will also discuss reasons for non-hunger eating and learn strategies to use for controlling emotional eating.  Targeting Your Nutrition Priorities Clinical staff led group instruction and group discussion with PowerPoint presentation and patient guidebook. To enhance the learning environment the use of posters, models and videos may be added. Patients will learn how to determine their genetic susceptibility to disease by reviewing their family history. Patients will gain insight into the importance of diet as part of an overall healthy lifestyle in mitigating the impact of genetics and other environmental insults. The purpose of this lesson is to provide patients with the opportunity to assess their personal nutrition priorities by looking at their family history, their own health history and current risk factors. Patients will also be able to discuss ways of prioritizing and modifying the Pritikin Eating Plan for their highest risk areas  Menu  Clinical staff led group instruction and group discussion with PowerPoint presentation and patient guidebook. To enhance the learning environment the use of posters, models and videos may be added. Using menus brought in from [employer], or printed from [employer], patients will apply the Pritikin dining out guidelines that were presented in the [employer] video. Patients will also be able to practice these guidelines in a variety of provided scenarios. The purpose of this lesson is to provide patients with the opportunity to practice hands-on learning of the Pritikin Dining Out guidelines with actual menus and practice scenarios.  Label Reading Clinical staff led group instruction and group discussion with PowerPoint presentation and  patient guidebook. To enhance the learning environment the use of posters, models and videos may be added. Patients will review and discuss the Pritikin label reading guidelines presented in Pritikin's Label Reading Educational series video. Using fool labels brought in from local grocery stores and markets, patients will apply the label reading guidelines and determine if the packaged food meet the Pritikin guidelines. The purpose of this lesson is to provide patients with the opportunity to review, discuss, and practice hands-on learning of the Pritikin Label Reading guidelines with actual packaged food labels.  Cooking School  Pritikin's [employer] are designed to teach patients ways to prepare quick, simple, and affordable recipes at home. The importance of nutrition's role in chronic disease risk reduction is reflected in its emphasis in the overall Pritikin program. By learning how to prepare essential core Pritikin Eating Plan recipes, patients will increase control over what they eat; be able to customize the flavor of foods without the use of added salt, sugar, or fat; and improve the quality of the food they consume. By learning a set of core recipes which are easily assembled, quickly prepared, and affordable, patients are more likely to prepare more healthy foods at home. These workshops focus on convenient breakfasts, simple entres, side dishes, and desserts which can be prepared with minimal effort and are consistent with nutrition recommendations for cardiovascular risk reduction. Cooking [employer] are taught by a Armed forces logistics/support/administrative officer (RD) who has been trained by the [employer]. The chef or RD has a clear understanding of the importance of minimizing - if not completely eliminating - added fat, sugar, and sodium in recipes. Throughout the series of Cooking School Workshop sessions, patients will learn about healthy ingredients and efficient methods of  cooking to build confidence in their capability to prepare    Cooking School weekly topics:  Adding Flavor- Sodium-Free  Fast and Healthy Breakfasts  Powerhouse Plant-Based Proteins  Satisfying Salads and Dressings  Simple Sides and Sauces  International Cuisine-Spotlight on the [employer] Zones  Delicious Desserts  Savory Soups  [employer] - Meals in a Astronomer Appetizers and Snacks  Comforting Weekend Breakfasts  One-Pot Wonders   Fast Evening Meals  Landscape architect Your Pritikin Plate  WORKSHOPS   Healthy Mindset (Psychosocial):  Focused Goals, Sustainable Changes Clinical staff led group instruction and group discussion with PowerPoint presentation and patient guidebook. To enhance the learning environment the use of posters, models and videos may be added. Patients will be able to apply effective goal setting strategies to establish at least one personal goal, and then take consistent, meaningful action toward that goal. They will learn to identify common barriers to achieving personal goals and develop strategies to overcome them. Patients will also gain an understanding of how our mind-set can impact our ability to achieve goals and the importance of cultivating a positive and growth-oriented mind-set. The purpose of this lesson is to provide patients with a deeper understanding of how to set and achieve personal goals, as well as the tools and strategies needed to overcome common obstacles which may arise along the way.  From Head to Heart: The Power of a Healthy Outlook  Clinical staff led group instruction and group discussion with PowerPoint presentation and patient guidebook. To enhance the learning environment the use of posters, models and videos may be added. Patients will be able to recognize and describe the impact of emotions and mood on physical health. They will discover the importance of self-care and explore self-care practices which may work  for them. Patients will also learn how to utilize the 4 C's to cultivate a healthier outlook and better manage stress and challenges. The purpose of this lesson is to demonstrate to patients how a healthy outlook is an essential part of maintaining good health, especially as they continue their cardiac rehab journey.  Healthy Sleep for a Healthy Heart Clinical staff led group instruction and group discussion with PowerPoint presentation and patient guidebook. To enhance the learning environment the use of posters,  models and videos may be added. At the conclusion of this workshop, patients will be able to demonstrate knowledge of the importance of sleep to overall health, well-being, and quality of life. They will understand the symptoms of, and treatments for, common sleep disorders. Patients will also be able to identify daytime and nighttime behaviors which impact sleep, and they will be able to apply these tools to help manage sleep-related challenges. The purpose of this lesson is to provide patients with a general overview of sleep and outline the importance of quality sleep. Patients will learn about a few of the most common sleep disorders. Patients will also be introduced to the concept of "sleep hygiene," and discover ways to self-manage certain sleeping problems through simple daily behavior changes. Finally, the workshop will motivate patients by clarifying the links between quality sleep and their goals of heart-healthy living.   Recognizing and Reducing Stress Clinical staff led group instruction and group discussion with PowerPoint presentation and patient guidebook. To enhance the learning environment the use of posters, models and videos may be added. At the conclusion of this workshop, patients will be able to understand the types of stress reactions, differentiate between acute and chronic stress, and recognize the impact that chronic stress has on their health. They will also be able to  apply different coping mechanisms, such as reframing negative self-talk. Patients will have the opportunity to practice a variety of stress management techniques, such as deep abdominal breathing, progressive muscle relaxation, and/or guided imagery.  The purpose of this lesson is to educate patients on the role of stress in their lives and to provide healthy techniques for coping with it.  Learning Barriers/Preferences:  Learning Barriers/Preferences - 01/27/24 1413       Learning Barriers/Preferences   Learning Barriers None    Learning Preferences Skilled Demonstration;Pictoral;Written Material;Computer/Internet;Group Instruction;Individual Instruction          Education Topics:  Knowledge Questionnaire Score:  Knowledge Questionnaire Score - 01/27/24 1413       Knowledge Questionnaire Score   Pre Score 22/24          Core Components/Risk Factors/Patient Goals at Admission:  Personal Goals and Risk Factors at Admission - 01/27/24 1416       Core Components/Risk Factors/Patient Goals on Admission   Diabetes Yes    Intervention Provide education about signs/symptoms and action to take for hypo/hyperglycemia.;Provide education about proper nutrition, including hydration, and aerobic/resistive exercise prescription along with prescribed medications to achieve blood glucose in normal ranges: Fasting glucose 65-99 mg/dL    Expected Outcomes Short Term: Participant verbalizes understanding of the signs/symptoms and immediate care of hyper/hypoglycemia, proper foot care and importance of medication, aerobic/resistive exercise and nutrition plan for blood glucose control.;Long Term: Attainment of HbA1C < 7%.    Hypertension Yes    Intervention Provide education on lifestyle modifcations including regular physical activity/exercise, weight management, moderate sodium restriction and increased consumption of fresh fruit, vegetables, and low fat dairy, alcohol moderation, and smoking  cessation.;Monitor prescription use compliance.    Expected Outcomes Short Term: Continued assessment and intervention until BP is < 140/95mm HG in hypertensive participants. < 130/45mm HG in hypertensive participants with diabetes, heart failure or chronic kidney disease.;Long Term: Maintenance of blood pressure at goal levels.    Lipids Yes    Intervention Provide education and support for participant on nutrition & aerobic/resistive exercise along with prescribed medications to achieve LDL 70mg , HDL >40mg .    Expected Outcomes Short Term: Participant states understanding of  desired cholesterol values and is compliant with medications prescribed. Participant is following exercise prescription and nutrition guidelines.;Long Term: Cholesterol controlled with medications as prescribed, with individualized exercise RX and with personalized nutrition plan. Value goals: LDL < 70mg , HDL > 40 mg.    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 Introduction to the Pritikin education program/intensive cardiac rehab. Initial orientation packet reviewed with patient.          Comments: Participant attended orientation for the cardiac rehabilitation program on  01/27/2024  to perform initial intake and exercise walk test. Patient introduced to the Pritikin Program education and orientation packet was reviewed. Completed 6-minute walk test, measurements, initial ITP, and exercise prescription. Vital signs stable. Telemetry-normal sinus  rhythm, asymptomatic.   Service time was from 1050 to 1245.

## 2024-01-27 NOTE — Telephone Encounter (Signed)
 Called patient to discuss hypotension. He was at cardiac rehab and told nurse that he stopped taking amlodipine  yesterday due to hypotension. Home blood pressure readings were 80s-90s systolic.  At cardiac rehab appointment blood pressure 110/70 sitting and 100/62 standing.    Discussed with him that amlodipine  can be stopped after 3 months from surgery. This is next week but due to hypotension he should continue to not use this medication.  He should continue to check his blood pressure at home and report readings to his cardiologist and PCP.  All questions answered at this time.

## 2024-01-27 NOTE — Progress Notes (Signed)
 Patient informed.

## 2024-01-27 NOTE — Progress Notes (Signed)
 Pt in today for cardiac rehab orientation.  Reported to rehab staff that he had low bp readings at home in the 80's systolic.  Pt opted to cut the amlodipine  dose in half to 2.5 for three days and stopped altogether on yesterday.  Taking his other medications as prescribed. Pt did not take his bp on yesterday.  Today's readings 110/70 sitting and 100/62 standing. Denies any symptoms.  Sent message to supervising provider - Orren Fabry PA  and sent message to Manuelita Rough PA ( prescribing provider for the amlodipine  due to use of Right radial artery).  Received the following from [place]:  Advised pt to aim for 64 ounces of water compression socks/hoses. Received the following from [place]: discontinue the amlodipine .  Pt and wife verbalized understanding.  Pt tolerated walk test with no complaints and the  bp wnl. Saturnino Bernett PEAK, BSN Cardiac and Emergency planning/management officer

## 2024-01-27 NOTE — Progress Notes (Signed)
 Cardiac Rehab Medication Review   Does the patient  feel that his/her medications are working for him/her?  yes  Has the patient been experiencing any side effects to the medications prescribed?  no  Does the patient measure his/her own blood pressure or blood glucose at home?  yes   Does the patient have any problems obtaining medications due to transportation or finances?   no  Understanding of regimen: good Understanding of indications: good Potential of compliance: good    Comments: Patient was having low BP readings so self-stopped amlodipine . Cardiologist now aware for follow up. He also checks his glucose daily via onetouch meter.     [name] Finder 01/27/2024 2:02 PM

## 2024-01-31 ENCOUNTER — Telehealth: Payer: Self-pay | Admitting: Cardiovascular Disease

## 2024-01-31 ENCOUNTER — Encounter (HOSPITAL_COMMUNITY)
Admission: RE | Admit: 2024-01-31 | Discharge: 2024-01-31 | Disposition: A | Discharge status: Home / Self Care | Source: Ambulatory Visit | Attending: Cardiovascular Disease | Admitting: Cardiovascular Disease

## 2024-01-31 DIAGNOSIS — I214 Non-ST elevation (NSTEMI) myocardial infarction: Secondary | ICD-10-CM

## 2024-01-31 DIAGNOSIS — Z48812 Encounter for surgical aftercare following surgery on the circulatory system: Secondary | ICD-10-CM | POA: Diagnosis not present

## 2024-01-31 DIAGNOSIS — Z951 Presence of aortocoronary bypass graft: Secondary | ICD-10-CM

## 2024-01-31 DIAGNOSIS — Z0279 Encounter for issue of other medical certificate: Secondary | ICD-10-CM

## 2024-01-31 LAB — GLUCOSE, CAPILLARY: Glucose-Capillary: 158 mg/dL — ABNORMAL HIGH (ref 70–99)

## 2024-01-31 NOTE — Telephone Encounter (Signed)
 Left detailed message on secure line explaining that a prior-auth form from [employer] has been faxed to the insurance company with information for cardiac rehab. Fax transmission resulted as successful. Cardiac rehab office told to call our office and let us  know if there any questions.

## 2024-01-31 NOTE — Telephone Encounter (Signed)
 Forms and payment were received on __9/15/25___ from patient. Placed in MD's box.

## 2024-01-31 NOTE — Progress Notes (Signed)
 Daily Session Note  Patient Details  Name: Marcus Hart MRN: [id number] Date of Birth: 1965-10-23 Referring Provider:   Flowsheet Row CARDIAC REHAB PHASE II ORIENTATION from 01/27/2024 in [facility]  Referring Provider Dr. Ozell Fell NP    Encounter Date: 01/31/2024  Check In:  Session Check In - 01/31/24 0711       Check-In   Supervising physician immediately available to respond to emergencies [facility] MD immediately available    Physician(s) Rosabel Mose, NP    Location [facility]    Staff Present Con Pereyra, MS, Exercise Physiologist;Jetta Vannie BS, ACSM-CEP, Exercise Physiologist;Olinty Valere, MS, ACSM-CEP, Exercise Physiologist;Kaylee Nicholaus, MS, ACSM-CEP, Exercise Physiologist;Raghav Verrilli Lennon, RN, BSN;Johnny Porter, MS, Exercise Physiologist    Virtual Visit No    Medication changes reported     No    Fall or balance concerns reported    No    Tobacco Cessation No Change    Warm-up and Cool-down Performed as group-led instruction    Resistance Training Performed Yes    VAD Patient? No    PAD/SET Patient? No      Pain Assessment   Currently in Pain? No/denies    Pain Score 0-No pain    Multiple Pain Sites No          Capillary Blood Glucose: Results for orders placed or performed during the hospital encounter of 01/31/24 (from the past 24 hours)  Glucose, capillary     Status: Abnormal   Collection Time: 01/31/24  7:03 AM  Result Value Ref Range   Glucose-Capillary 158 (H) 70 - 99 mg/dL     Exercise Prescription Changes - 01/31/24 0822       Response to Exercise   Blood Pressure (Admit) 110/62    Blood Pressure (Exercise) 118/54    Blood Pressure (Exit) 108/60    Heart Rate (Admit) 76 bpm    Heart Rate (Exercise) 100 bpm    Heart Rate (Exit) 85 bpm    Rating of Perceived Exertion (Exercise) 8    Perceived Dyspnea (Exercise) 0    Symptoms none    Comments Pt  first day in the [facility] program    Duration Progress to 30 minutes of  aerobic without signs/symptoms of physical distress    Intensity THRR unchanged      Progression   Progression Continue to progress workloads to maintain intensity without signs/symptoms of physical distress.    Average METs 2.2      Resistance Training   Training Prescription Yes    Weight 3    Reps 10-15    Time 10 Minutes      Recumbant Bike   Level 2    RPM 68    Watts 19    Minutes 15    METs 2.4      NuStep   Level 2    SPM 67    Minutes 15    METs 2          Social History   Tobacco Use  Smoking Status Never  Smokeless Tobacco Never    Goals Met:  Personal goals reviewed No report of concerns or symptoms today Strength training completed today  Goals Unmet:  Not Applicable  Comments: Pt started cardiac rehab today.  Pt tolerated light exercise without difficulty. VSS, telemetry-NSR, asymptomatic.  Medication list reconciled. Pt denies barriers to medication compliance.  PSYCHOSOCIAL ASSESSMENT:  PHQ-0. Pt exhibits positive  coping skills, hopeful outlook with supportive family. No psychosocial needs identified at this time, no psychosocial interventions necessary.  Pt oriented to exercise equipment and routine.    Understanding verbalized.     Dr. Wilbert Bihari is Medical Director for Cardiac Rehab at [facility].

## 2024-02-01 ENCOUNTER — Telehealth: Payer: Self-pay | Admitting: Cardiovascular Disease

## 2024-02-01 NOTE — Telephone Encounter (Signed)
 She said they need the start date and end date and how many for the request for prior auth for coronary bypass grafting.  She said nurse Keven sent the request.

## 2024-02-02 ENCOUNTER — Encounter (HOSPITAL_COMMUNITY)
Admission: RE | Admit: 2024-02-02 | Discharge: 2024-02-02 | Disposition: A | Discharge status: Home / Self Care | Source: Ambulatory Visit | Attending: Cardiovascular Disease | Admitting: Cardiovascular Disease

## 2024-02-02 DIAGNOSIS — Z951 Presence of aortocoronary bypass graft: Secondary | ICD-10-CM

## 2024-02-02 DIAGNOSIS — Z48812 Encounter for surgical aftercare following surgery on the circulatory system: Secondary | ICD-10-CM | POA: Diagnosis not present

## 2024-02-02 DIAGNOSIS — I214 Non-ST elevation (NSTEMI) myocardial infarction: Secondary | ICD-10-CM

## 2024-02-02 LAB — GLUCOSE, CAPILLARY
Glucose-Capillary: 153 mg/dL — ABNORMAL HIGH (ref 70–99)
Glucose-Capillary: 145 mg/dL — ABNORMAL HIGH (ref 70–99)

## 2024-02-02 NOTE — Telephone Encounter (Signed)
 LVM with [facility] cardiac re[facility] to get this information and make sure it is correct in order for us  to call the insurance company back.

## 2024-02-03 ENCOUNTER — Telehealth (HOSPITAL_COMMUNITY): Payer: Self-pay

## 2024-02-03 NOTE — Telephone Encounter (Signed)
 Meade with [facility] following up. She says they received the form but it was incomplete. She says 2 out of 3 sections on the form were completed and she will be re-faxing it to the office.

## 2024-02-03 NOTE — Telephone Encounter (Signed)
 Received medicaid form back from Dr. Ricardo office incomplete. Called Dr. Ricardo office and spoke with Rueben and advised her the form is not completed, she stated that she will reach out to Dr. Wonda and his nurse to see if they can complete the medicaid form. Will resend the form back to be completed.

## 2024-02-04 ENCOUNTER — Encounter (HOSPITAL_COMMUNITY)
Admission: RE | Admit: 2024-02-04 | Discharge: 2024-02-04 | Disposition: A | Discharge status: Home / Self Care | Source: Ambulatory Visit | Attending: Cardiovascular Disease | Admitting: Cardiovascular Disease

## 2024-02-04 DIAGNOSIS — Z951 Presence of aortocoronary bypass graft: Secondary | ICD-10-CM

## 2024-02-04 DIAGNOSIS — Z48812 Encounter for surgical aftercare following surgery on the circulatory system: Secondary | ICD-10-CM | POA: Diagnosis not present

## 2024-02-04 DIAGNOSIS — I214 Non-ST elevation (NSTEMI) myocardial infarction: Secondary | ICD-10-CM

## 2024-02-04 NOTE — Telephone Encounter (Signed)
 Dr. Wonda walked paperwork down to to Bernardino Sprang, RN at [facility] on 4th floor to complete on 02/03/24.

## 2024-02-07 ENCOUNTER — Encounter (HOSPITAL_COMMUNITY)
Admission: RE | Admit: 2024-02-07 | Discharge: 2024-02-07 | Disposition: A | Discharge status: Home / Self Care | Source: Ambulatory Visit | Attending: Cardiovascular Disease | Admitting: Cardiovascular Disease

## 2024-02-07 DIAGNOSIS — I214 Non-ST elevation (NSTEMI) myocardial infarction: Secondary | ICD-10-CM

## 2024-02-07 DIAGNOSIS — Z951 Presence of aortocoronary bypass graft: Secondary | ICD-10-CM

## 2024-02-07 DIAGNOSIS — Z48812 Encounter for surgical aftercare following surgery on the circulatory system: Secondary | ICD-10-CM | POA: Diagnosis not present

## 2024-02-09 ENCOUNTER — Encounter (HOSPITAL_COMMUNITY)
Admission: RE | Admit: 2024-02-09 | Discharge: 2024-02-09 | Disposition: A | Discharge status: Home / Self Care | Source: Ambulatory Visit | Attending: Cardiovascular Disease

## 2024-02-09 DIAGNOSIS — I214 Non-ST elevation (NSTEMI) myocardial infarction: Secondary | ICD-10-CM

## 2024-02-09 DIAGNOSIS — Z48812 Encounter for surgical aftercare following surgery on the circulatory system: Secondary | ICD-10-CM | POA: Diagnosis not present

## 2024-02-09 DIAGNOSIS — Z951 Presence of aortocoronary bypass graft: Secondary | ICD-10-CM

## 2024-02-11 ENCOUNTER — Other Ambulatory Visit: Payer: Self-pay

## 2024-02-11 ENCOUNTER — Encounter (HOSPITAL_COMMUNITY)
Admission: RE | Admit: 2024-02-11 | Discharge: 2024-02-11 | Disposition: A | Discharge status: Home / Self Care | Source: Ambulatory Visit | Attending: Cardiovascular Disease | Admitting: Cardiovascular Disease

## 2024-02-11 DIAGNOSIS — I214 Non-ST elevation (NSTEMI) myocardial infarction: Secondary | ICD-10-CM

## 2024-02-11 DIAGNOSIS — Z48812 Encounter for surgical aftercare following surgery on the circulatory system: Secondary | ICD-10-CM | POA: Diagnosis not present

## 2024-02-11 DIAGNOSIS — Z951 Presence of aortocoronary bypass graft: Secondary | ICD-10-CM

## 2024-02-12 ENCOUNTER — Encounter: Payer: Self-pay | Admitting: Cardiovascular Disease

## 2024-02-14 ENCOUNTER — Other Ambulatory Visit (HOSPITAL_COMMUNITY): Payer: Self-pay

## 2024-02-14 ENCOUNTER — Other Ambulatory Visit: Payer: Self-pay

## 2024-02-14 ENCOUNTER — Telehealth: Payer: Self-pay

## 2024-02-14 ENCOUNTER — Encounter (HOSPITAL_COMMUNITY)
Admission: RE | Admit: 2024-02-14 | Discharge: 2024-02-14 | Disposition: A | Discharge status: Home / Self Care | Source: Ambulatory Visit | Attending: Cardiovascular Disease | Admitting: Cardiovascular Disease

## 2024-02-14 ENCOUNTER — Telehealth: Payer: Self-pay | Admitting: Pharmacy Technician

## 2024-02-14 ENCOUNTER — Telehealth (HOSPITAL_COMMUNITY): Payer: Self-pay

## 2024-02-14 DIAGNOSIS — I214 Non-ST elevation (NSTEMI) myocardial infarction: Secondary | ICD-10-CM

## 2024-02-14 DIAGNOSIS — Z48812 Encounter for surgical aftercare following surgery on the circulatory system: Secondary | ICD-10-CM | POA: Diagnosis not present

## 2024-02-14 DIAGNOSIS — Z951 Presence of aortocoronary bypass graft: Secondary | ICD-10-CM

## 2024-02-14 NOTE — Telephone Encounter (Signed)
 Pharmacy Patient Advocate Encounter   Received notification from Onbase that prior authorization for Trulicity  3MG /0.5ML auto-injectors  is required/requested.   Insurance verification completed.   The patient is insured through [facility] MEDICAID .   Per test claim: PA required; PA submitted to above mentioned insurance via Latent Key/confirmation #/EOC [id number] Status is pending

## 2024-02-14 NOTE — Telephone Encounter (Signed)
 [id number]  Pharmacy Patient Advocate Encounter   Received notification from CoverMyMeds that prior authorization for REPATHA  is required/requested.   Insurance verification completed.   The patient is insured through [facility] MEDICAID .   Per test claim: PA required; PA submitted to above mentioned insurance via Latent Key/confirmation #/EOC [id number] Status is pending

## 2024-02-14 NOTE — Telephone Encounter (Signed)
 PMH is significant for NSTEMI s/p CABGx2 10/2023    Faxed [phone] insurance showing the notes were sent he has had a nstemi.

## 2024-02-14 NOTE — Telephone Encounter (Addendum)
     And has copay card  Sent message to have rebilled  [place] is termed. Started pa on new ins

## 2024-02-14 NOTE — Telephone Encounter (Signed)
 Completed Medicaid form received from Dr. Margurite office.

## 2024-02-15 ENCOUNTER — Other Ambulatory Visit (HOSPITAL_COMMUNITY): Payer: Self-pay

## 2024-02-15 ENCOUNTER — Other Ambulatory Visit: Payer: Self-pay

## 2024-02-15 NOTE — Telephone Encounter (Signed)
 Pharmacy Patient Advocate Encounter  Received notification from [employer] that Prior Authorization for repatha  has been APPROVED from 02/14/24 to 02/13/25   I asked team to fill 4.00

## 2024-02-15 NOTE — Telephone Encounter (Signed)
 Pharmacy Patient Advocate Encounter  Received notification from [facility] MEDICAID that Prior Authorization for Trulicity  3MG /0.5ML auto-injectors  has been APPROVED from 02/14/24 to 01/24/25. Ran test claim, Copay is $4. This test claim was processed through [facility]- copay amounts may vary at other pharmacies due to pharmacy/plan contracts, or as the patient moves through the different stages of their insurance plan.   PA #/Case ID/Reference #: [id number]

## 2024-02-16 ENCOUNTER — Encounter (HOSPITAL_COMMUNITY)
Admission: RE | Admit: 2024-02-16 | Discharge: 2024-02-16 | Disposition: A | Discharge status: Home / Self Care | Source: Ambulatory Visit | Attending: Cardiovascular Disease | Admitting: Cardiovascular Disease

## 2024-02-16 DIAGNOSIS — I214 Non-ST elevation (NSTEMI) myocardial infarction: Secondary | ICD-10-CM | POA: Diagnosis present

## 2024-02-16 DIAGNOSIS — Z951 Presence of aortocoronary bypass graft: Secondary | ICD-10-CM | POA: Insufficient documentation

## 2024-02-18 ENCOUNTER — Encounter (HOSPITAL_COMMUNITY)
Admission: RE | Admit: 2024-02-18 | Discharge: 2024-02-18 | Disposition: A | Discharge status: Home / Self Care | Source: Ambulatory Visit | Attending: Cardiovascular Disease | Admitting: Cardiovascular Disease

## 2024-02-18 ENCOUNTER — Other Ambulatory Visit: Payer: Self-pay

## 2024-02-18 ENCOUNTER — Other Ambulatory Visit: Payer: Self-pay | Admitting: Family Medicine

## 2024-02-18 ENCOUNTER — Ambulatory Visit: Admitting: Family Medicine

## 2024-02-18 DIAGNOSIS — E1165 Type 2 diabetes mellitus with hyperglycemia: Secondary | ICD-10-CM

## 2024-02-18 DIAGNOSIS — Z951 Presence of aortocoronary bypass graft: Secondary | ICD-10-CM

## 2024-02-18 DIAGNOSIS — I214 Non-ST elevation (NSTEMI) myocardial infarction: Secondary | ICD-10-CM

## 2024-02-18 MED ORDER — TRULICITY 3 MG/0.5ML ~~LOC~~ SOAJ
3.0000 mg | SUBCUTANEOUS | 1 refills | Status: AC
  Filled 2024-03-24: qty 2, 28d supply, fill #0
  Filled 2024-06-02 – 2024-06-23 (×3): qty 2, 28d supply, fill #1

## 2024-02-18 NOTE — Telephone Encounter (Unsigned)
 Copied from CRM [id number]. Topic: Clinical - Medication Refill >> Feb 18, 2024  1:37 PM Marcus Hart wrote: Medication: trulicity   Has the patient contacted their pharmacy? Yes (Agent: If no, request that the patient contact the pharmacy for the refill. If patient does not wish to contact the pharmacy document the reason why and proceed with request.) (Agent: If yes, when and what did the pharmacy advise?)  This is the patient's preferred pharmacy:   [facility] [address] [place] [address] Phone: [phone] Fax: [phone]  Is this the correct pharmacy for this prescription? Yes If no, delete pharmacy and type the correct one.   Has the prescription been filled recently? No  Is the patient out of the medication? Yes  Has the patient been seen for an appointment in the last year OR does the patient have an upcoming appointment? Yes  Can we respond through MyChart? Yes  Agent: Please be advised that Rx refills may take up to 3 business days. We ask that you follow-up with your pharmacy.

## 2024-02-21 ENCOUNTER — Encounter (HOSPITAL_COMMUNITY)
Admission: RE | Admit: 2024-02-21 | Discharge: 2024-02-21 | Disposition: A | Discharge status: Home / Self Care | Source: Ambulatory Visit | Attending: Cardiovascular Disease | Admitting: Cardiovascular Disease

## 2024-02-21 DIAGNOSIS — I214 Non-ST elevation (NSTEMI) myocardial infarction: Secondary | ICD-10-CM

## 2024-02-21 DIAGNOSIS — Z951 Presence of aortocoronary bypass graft: Secondary | ICD-10-CM

## 2024-02-22 ENCOUNTER — Other Ambulatory Visit: Payer: Self-pay

## 2024-02-22 NOTE — Progress Notes (Signed)
 Cardiac Individual Treatment Plan  Patient Details  Name: Marcus Hart MRN: [id number] Date of Birth: [birth year] Referring Provider:   Flowsheet Row CARDIAC REHAB PHASE II ORIENTATION from 01/27/2024 in [facility] for Heart, Vascular, & Lung Health  Referring Provider Dr. Ozell Fell NP    Initial Encounter Date:  Flowsheet Row CARDIAC REHAB PHASE II ORIENTATION from 01/27/2024 in [facility] for Heart, Vascular, & Lung Health  Date 01/27/24    Visit Diagnosis: NSTEMI (non-ST elevated myocardial infarction) (HCC)  S/P CABG x 2  Patient's Home Medications on Admission:  Current Outpatient Medications:    amLODipine  (NORVASC ) 5 MG tablet, Take 1 tablet (5 mg total) by mouth daily. (Patient taking differently: Take 5 mg by mouth daily. Cardiologist is now aware patient is not taking medication. He took himself off this medication), Disp: 30 tablet, Rfl: 1   aspirin  EC 81 MG tablet, Take 81 mg by mouth daily. Swallow whole., Disp: , Rfl:    clopidogrel  (PLAVIX ) 75 MG tablet, Take 1 tablet (75 mg total) by mouth daily., Disp: 90 tablet, Rfl: 3   Dulaglutide  (TRULICITY ) 3 MG/0.5ML SOAJ, Inject 3 mg into the skin once a week., Disp: 6 mL, Rfl: 1   empagliflozin  (JARDIANCE ) 25 MG TABS tablet, Take 1 tablet (25 mg total) by mouth daily before breakfast., Disp: 90 tablet, Rfl: 1   Evolocumab  (REPATHA  SURECLICK) 140 MG/ML SOAJ, Inject 140 mg into the skin every 14 (fourteen) days., Disp: 2 mL, Rfl: 11   Insulin  Pen Needle (PEN NEEDLES) 32G X 4 MM MISC, Use with Victoza  to inject once daily, Disp: 90 each, Rfl: 3   Lancets (ONETOUCH DELICA PLUS LANCET33G) MISC, USE AS DIRECTED TO TEST BLOOD GLUCOSE, Disp: 100 each, Rfl: 1   metFORMIN  (GLUCOPHAGE ) 1000 MG tablet, Take 1 tablet (1,000 mg total) by mouth 2 (two) times daily with a meal., Disp: 180 tablet, Rfl: 1   metoprolol  tartrate (LOPRESSOR ) 25 MG tablet, Take 1/2 tablets (12.5 mg  total) by mouth 2 (two) times daily., Disp: 180 tablet, Rfl: 3   Multiple Vitamins-Minerals (MULTIVITAMIN WITH MINERALS) tablet, Take 1 tablet by mouth daily., Disp: , Rfl:    nitroGLYCERIN  (NITROSTAT ) 0.4 MG SL tablet, Place 1 tablet (0.4 mg total) under the tongue every 5 (five) minutes as needed for chest pain. If you need more than 3 doses in 24 hours, call 911 or go to nearest ER, Disp: 25 tablet, Rfl: 3   predniSONE  (DELTASONE ) 20 MG tablet, Take 1 tablet (20 mg total) by mouth daily in case of gout flare-must follow-up with primary care physician if does not resolve with this, Disp: 3 tablet, Rfl: 0   Study - SOS-AMI - selatogrel 16 mg/0.5 mL or placebo SQ injection (PI-Christopher), Inject 0.5 mLs (16 mg total) into the skin as needed (As needed for for symptoms of a heart attack (chest pain).)., Disp: 1 mL, Rfl: 0  Past Medical History: Past Medical History:  Diagnosis Date   Diabetes mellitus (HCC)    Diabetes mellitus without complication (HCC)    Phreesia 11/10/2019   Hyperlipidemia    STEMI (ST elevation myocardial infarction) (HCC)     Tobacco Use: Social History   Tobacco Use  Smoking Status Never  Smokeless Tobacco Never    Labs: Review Flowsheet  More data exists      Latest Ref Rng & Units 11/07/2023 11/08/2023 11/09/2023 12/20/2023 01/01/2024  Labs for ITP Cardiac and Pulmonary Rehab  Cholestrol 100 -  199 mg/dL - - - 852  -  LDL (calc) 0 - 99 mg/dL - - - 79  -  HDL-C >60 mg/dL - - - 55  -  Trlycerides 0 - 149 mg/dL - - - 67  -  Hemoglobin A1c 4.8 - 5.6 % - - - - 7.0   PH, Arterial 7.35 - 7.45 7.42  7.280  7.420  7.430  7.446  7.399  7.367  7.294  - -  PCO2 arterial 32 - 48 mmHg 42  42.9  32.2  32.5  32.7  36.5  38.7  43.7  - -  Bicarbonate 20.0 - 28.0 mmol/L 27.2  20.0  21.1  21.6  22.5  24.4  22.6  22.2  21.0  - -  TCO2 22 - 32 mmol/L - 21  22  23  25  23  24  26  24  23  23  27  22   - -  Acid-base deficit 0.0 - 2.0 mmol/L - 6.0  3.0  2.0  1.0  2.0  3.0  5.0  - -   O2 Saturation % 98.8  99  100  100  100  78  100  100  99  - -    Details       Multiple values from one day are sorted in reverse-chronological order          Exercise Target Goals: Exercise Program Goal: Individual exercise prescription set using results from initial 6 min walk test and THRR while considering  patient's activity barriers and safety.   Exercise Prescription Goal: Initial exercise prescription builds to 30-45 minutes a day of aerobic activity, 2-3 days per week.  Home exercise guidelines will be given to patient during program as part of exercise prescription that the participant will acknowledge.   Education: Aerobic Exercise: - Group verbal and visual presentation on the components of exercise prescription. Introduces F.I.T.T principle from ACSM for exercise prescriptions.  Reviews F.I.T.T. principles of aerobic exercise including progression. Written material provided at class time.   Education: Resistance Exercise: - Group verbal and visual presentation on the components of exercise prescription. Introduces F.I.T.T principle from ACSM for exercise prescriptions  Reviews F.I.T.T. principles of resistance exercise including progression. Written material provided at class time.    Education: Exercise & Equipment Safety: - Individual verbal instruction and demonstration of equipment use and safety with use of the equipment.   Education: Exercise Physiology & General Exercise Guidelines: - Group verbal and written instruction with models to review the exercise physiology of the cardiovascular system and associated critical values. Provides general exercise guidelines with specific guidelines to those with heart or lung disease. Written material provided at class time.   Education: Flexibility, Balance, Mind/Body Relaxation: - Group verbal and visual presentation with interactive activity on the components of exercise prescription. Introduces F.I.T.T principle  from ACSM for exercise prescriptions. Reviews F.I.T.T. principles of flexibility and balance exercise training including progression. Also discusses the mind body connection.  Reviews various relaxation techniques to help reduce and manage stress (i.e. Deep breathing, progressive muscle relaxation, and visualization). Balance handout provided to take home. Written material provided at class time.   Activity Barriers & Risk Stratification:  Activity Barriers & Cardiac Risk Stratification - 01/27/24 1407       Activity Barriers & Cardiac Risk Stratification   Activity Barriers Deconditioning    Cardiac Risk Stratification High   Under 5 MET on walk test  6 Minute Walk:  6 Minute Walk     Row Name 01/27/24 1405         6 Minute Walk   Phase Initial     Distance 1370 feet     Walk Time 6 minutes     # of Rest Breaks 0     MPH 2.5     METS 2.78     RPE 8     Perceived Dyspnea  0     VO2 Peak 9.73     Symptoms Yes (comment)     Comments Pre-orthostatic sitting 110/70 standing 100/62     Resting HR 60 bpm     Resting BP 110/70     Resting Oxygen Saturation  100 %     Exercise Oxygen Saturation  during 6 min walk 100 %     Max Ex. HR 83 bpm     Max Ex. BP 124/68     2 Minute Post BP 114/62        Oxygen Initial Assessment:   Oxygen Re-Evaluation:   Oxygen Discharge (Final Oxygen Re-Evaluation):   Initial Exercise Prescription:  Initial Exercise Prescription - 01/27/24 1400       Date of Initial Exercise RX and Referring Provider   Date 01/27/24    Referring Provider Dr. Ozell Fell NP    Expected Discharge Date 03/08/24      Recumbant Bike   Level 2    RPM 50    Watts 15    Minutes 15    METs 2.5      NuStep   Level 2    SPM 75    Minutes 15    METs 1.9      Prescription Details   Frequency (times per week) 3    Duration Progress to 30 minutes of continuous aerobic without signs/symptoms of physical distress      Intensity   THRR  40-80% of Max Heartrate 65-131    Ratings of Perceived Exertion 11-13    Perceived Dyspnea 0-4      Progression   Progression Continue progressive overload as per policy without signs/symptoms or physical distress.      Resistance Training   Training Prescription Yes    Weight 3    Reps 10-15          Perform Capillary Blood Glucose checks as needed.  Exercise Prescription Changes:   Exercise Prescription Changes     Row Name 01/31/24 [phone] 02/18/24 0849           Response to Exercise   Blood Pressure (Admit) 110/62 118/64      Blood Pressure (Exercise) 118/54 --      Blood Pressure (Exit) 108/60 110/60      Heart Rate (Admit) 76 bpm 80 bpm      Heart Rate (Exercise) 100 bpm 123 bpm      Heart Rate (Exit) 85 bpm 83 bpm      Rating of Perceived Exertion (Exercise) 8 10      Perceived Dyspnea (Exercise) 0 0      Symptoms none none      Comments Pt first day in the [facility] program Reviewed MET's, goals and home ExRx      Duration Progress to 30 minutes of  aerobic without signs/symptoms of physical distress Progress to 30 minutes of  aerobic without signs/symptoms of physical distress      Intensity THRR unchanged THRR unchanged  Progression   Progression Continue to progress workloads to maintain intensity without signs/symptoms of physical distress. Continue to progress workloads to maintain intensity without signs/symptoms of physical distress.      Average METs 2.2 3.6        Resistance Training   Training Prescription Yes Yes      Weight 3 4      Reps 10-15 10-15      Time 10 Minutes 10 Minutes        Recumbant Bike   Level 2 3      RPM 68 87      Watts 19 49      Minutes 15 15      METs 2.4 3.6        NuStep   Level 2 2      SPM 67 109      Minutes 15 15      METs 2 3        Home Exercise Plan   Plans to continue exercise at -- [employer] (comment)      Frequency -- Add 2 additional days to program exercise sessions.      Initial  Home Exercises Provided -- 02/18/24         Exercise Comments:   Exercise Comments     Row Name 01/31/24 0825 02/18/24 0852         Exercise Comments Pt first day in the [facility] program. Pt tolerated exercise well with an average MET level of 2.2. Pt is off to a good start and is learning his THRR, RPE and ExRx Reviewed MET's, goals and home ExRx. Pt tolerated exercise well with an average MET level of 3.6. Pt is doing well and progressing WL's and MET's. INC WL on nustep today and set some new goals he's doing well overall and is increasing strength and endurance. He plans to add in exercise on his own for 1-2 days for 30 mins at the [facility], walking or treadmill.         Exercise Goals and Review:   Exercise Goals     Row Name 01/27/24 1410             Exercise Goals   Increase Physical Activity Yes       Intervention Provide advice, education, support and counseling about physical activity/exercise needs.;Develop an individualized exercise prescription for aerobic and resistive training based on initial evaluation findings, risk stratification, comorbidities and participant's personal goals.       Expected Outcomes Short Term: Attend rehab on a regular basis to increase amount of physical activity.;Long Term: Exercising regularly at least 3-5 days a week.;Long Term: Add in home exercise to make exercise part of routine and to increase amount of physical activity.       Increase Strength and Stamina Yes       Intervention Provide advice, education, support and counseling about physical activity/exercise needs.;Develop an individualized exercise prescription for aerobic and resistive training based on initial evaluation findings, risk stratification, comorbidities and participant's personal goals.       Expected Outcomes Short Term: Increase workloads from initial exercise prescription for resistance, speed, and METs.;Short Term: Perform resistance training exercises  routinely during rehab and add in resistance training at home;Long Term: Improve cardiorespiratory fitness, muscular endurance and strength as measured by increased METs and functional capacity ( )       Able to understand and use rate of perceived exertion (RPE) scale Yes  Intervention Provide education and explanation on how to use RPE scale       Expected Outcomes Short Term: Able to use RPE daily in rehab to express subjective intensity level;Long Term:  Able to use RPE to guide intensity level when exercising independently       Knowledge and understanding of Target Heart Rate Range (THRR) Yes       Intervention Provide education and explanation of THRR including how the numbers were predicted and where they are located for reference       Expected Outcomes Short Term: Able to state/look up THRR;Long Term: Able to use THRR to govern intensity when exercising independently;Short Term: Able to use daily as guideline for intensity in rehab       Understanding of Exercise Prescription Yes       Intervention Provide education, explanation, and written materials on patient's individual exercise prescription       Expected Outcomes Short Term: Able to explain program exercise prescription;Long Term: Able to explain home exercise prescription to exercise independently          Exercise Goals Re-Evaluation :  Exercise Goals Re-Evaluation     Row Name 01/31/24 0825 02/18/24 0850           Exercise Goal Re-Evaluation   Exercise Goals Review Increase Physical Activity;Understanding of Exercise Prescription;Increase Strength and Stamina;Knowledge and understanding of Target Heart Rate Range (THRR);Able to understand and use rate of perceived exertion (RPE) scale Increase Physical Activity;Understanding of Exercise Prescription;Increase Strength and Stamina;Knowledge and understanding of Target Heart Rate Range (THRR);Able to understand and use rate of perceived exertion (RPE) scale       Comments Pt first day in the [facility] program. Pt tolerated exercise well with an average MET level of 2.2. Pt is off to a good start and is learning his THRR, RPE and ExRx Reviewed MET's, goals and home ExRx. Pt tolerated exercise well with an average MET level of 3.6. Pt is doing well and progressing WL's and MET's. INC WL on nustep today and set some new goals he's doing well overall and is increasing strength and endurance. He plans to add in exercise on his own for 1-2 days for 30 mins at the [facility], walking or treadmill.      Expected Outcomes Will continue to monitor pt and progress workloads as tolerated without sign or symptom Will continue to monitor pt and progress workloads as tolerated without sign or symptom         Discharge Exercise Prescription (Final Exercise Prescription Changes):  Exercise Prescription Changes - 02/18/24 0849       Response to Exercise   Blood Pressure (Admit) 118/64    Blood Pressure (Exit) 110/60    Heart Rate (Admit) 80 bpm    Heart Rate (Exercise) 123 bpm    Heart Rate (Exit) 83 bpm    Rating of Perceived Exertion (Exercise) 10    Perceived Dyspnea (Exercise) 0    Symptoms none    Comments Reviewed MET's, goals and home ExRx    Duration Progress to 30 minutes of  aerobic without signs/symptoms of physical distress    Intensity THRR unchanged      Progression   Progression Continue to progress workloads to maintain intensity without signs/symptoms of physical distress.    Average METs 3.6      Resistance Training   Training Prescription Yes    Weight 4    Reps 10-15    Time 10 Minutes  Recumbant Bike   Level 3    RPM 87    Watts 49    Minutes 15    METs 3.6      NuStep   Level 2    SPM 109    Minutes 15    METs 3      Home Exercise Plan   Plans to continue exercise at [employer] (comment)    Frequency Add 2 additional days to program exercise sessions.    Initial Home Exercises Provided 02/18/24           Nutrition:  Target Goals: Understanding of nutrition guidelines, daily intake of sodium 1500mg , cholesterol 200mg , calories 30% from fat and 7% or less from saturated fats, daily to have 5 or more servings of fruits and vegetables.  Education: Nutrition 1 -Group instruction provided by verbal, written material, interactive activities, discussions, models, and posters to present general guidelines for heart healthy nutrition including macronutrients, label reading, and promoting whole foods over processed counterparts. Education serves as Pensions consultant of discussion of heart healthy eating for all. Written material provided at class time.    Education: Nutrition 2 -Group instruction provided by verbal, written material, interactive activities, discussions, models, and posters to present general guidelines for heart healthy nutrition including sodium, cholesterol, and saturated fat. Providing guidance of habit forming to improve blood pressure, cholesterol, and body weight. Written material provided at class time.     Biometrics:  Pre Biometrics - 01/27/24 1400       Pre Biometrics   Waist Circumference 35 inches    Hip Circumference 38 inches    Waist to Hip Ratio 0.92 %    Triceps Skinfold 16 mm    % Body Fat 23.8 %    Grip Strength 34 kg    Flexibility 14.25 in    Single Leg Stand 30 seconds           Nutrition Therapy Plan and Nutrition Goals:   Nutrition Assessments:  Nutrition Assessments - 02/18/24 1215       Rate Your Plate Scores   Pre Score 74         MEDIFICTS Score Key: >=70 Need to make dietary changes  40-70 Heart Healthy Diet <= 40 Therapeutic Level Cholesterol Diet  Flowsheet Row CARDIAC REHAB PHASE II EXERCISE from 02/16/2024 in [facility] for Heart, Vascular, & Lung Health  Picture Your Plate Total Score on Admission 74   Picture Your Plate Scores: <59 Unhealthy dietary pattern with much room for improvement. 41-50  Dietary pattern unlikely to meet recommendations for good health and room for improvement. 51-60 More healthful dietary pattern, with some room for improvement.  >60 Healthy dietary pattern, although there may be some specific behaviors that could be improved.    Nutrition Goals Re-Evaluation:   Nutrition Goals Discharge (Final Nutrition Goals Re-Evaluation):   Psychosocial: Target Goals: Acknowledge presence or absence of significant depression and/or stress, maximize coping skills, provide positive support system. Participant is able to verbalize types and ability to use techniques and skills needed for reducing stress and depression.   Education: Stress, Anxiety, and Depression - Group verbal and visual presentation to define topics covered.  Reviews how body is impacted by stress, anxiety, and depression.  Also discusses healthy ways to reduce stress and to treat/manage anxiety and depression. Written material provided at class time.   Education: Sleep Hygiene -Provides group verbal and written instruction about how sleep can affect your health.  Define sleep hygiene,  discuss sleep cycles and impact of sleep habits. Review good sleep hygiene tips.   Initial Review & Psychosocial Screening:  Initial Psych Review & Screening - 01/27/24 1411       Initial Review   Current issues with None Identified      Family Dynamics   Good Support System? Yes   His wife and family are good support. He has no needs at this time     Barriers   Psychosocial barriers to participate in program The patient should benefit from training in stress management and relaxation.      Screening Interventions   Interventions Encouraged to exercise;Provide feedback about the scores to participant    Expected Outcomes Short Term goal: Identification and review with participant of any Quality of Life or Depression concerns found by scoring the questionnaire.;Long Term goal: The participant improves quality of  Life and PHQ9 Scores as seen by post scores and/or verbalization of changes;Long Term Goal: Stressors or current issues are controlled or eliminated.          Quality of Life Scores:   Quality of Life - 02/16/24 0854       Quality of Life   Select Quality of Life   Pre     Quality of Life Scores   Health/Function Pre 23.08 %    Socioeconomic Pre 25 %    Psych/Spiritual Pre 24 %    Family Pre 30 %    GLOBAL Pre 24.77 %         Scores of 19 and below usually indicate a poorer quality of life in these areas.  A difference of  2-3 points is a clinically meaningful difference.  A difference of 2-3 points in the total score of the Quality of Life Index has been associated with significant improvement in overall quality of life, self-image, physical symptoms, and general health in studies assessing change in quality of life.  PHQ-9: Review Flowsheet  More data exists      01/27/2024 01/06/2024 10/01/2023 06/18/2023 02/19/2023  Depression screen PHQ 2/9  Decreased Interest 0 0 0 0 0  Down, Depressed, Hopeless 0 0 0 0 0  PHQ - 2 Score 0 0 0 0 0  Altered sleeping 0 0 0 0 0  Tired, decreased energy 0 2 1 0 1  Change in appetite 0 0 1 0 0  Feeling bad or failure about yourself  0 0 0 0 0  Trouble concentrating 0 0 0 0 0  Moving slowly or fidgety/restless 0 0 0 0 0  Suicidal thoughts 0 0 0 0 0  PHQ-9 Score 0 2 2 0 1  Difficult doing work/chores - Not difficult at all Not difficult at all - -   Interpretation of Total Score  Total Score Depression Severity:  1-4 = Minimal depression, 5-9 = Mild depression, 10-14 = Moderate depression, 15-19 = Moderately severe depression, 20-27 = Severe depression   Psychosocial Evaluation and Intervention:   Psychosocial Re-Evaluation:  Psychosocial Re-Evaluation     Row Name 01/31/24 [phone] 02/15/24 0855           Psychosocial Re-Evaluation   Current issues with None Identified None Identified      Comments Ormond voiced no pyschosocial  concerns today on his first day of exercise at cardiac rehab Johan has not voiced any increased concerns or stressors during exercise at cardiac rehab      Expected Outcomes Hakeem will have controlled or decreased stressors upon completion of cardiac rehab --  Interventions Stress management education;Relaxation education;Encouraged to attend Cardiac Rehabilitation for the exercise Encouraged to attend Cardiac Rehabilitation for the exercise;Relaxation education      Continue Psychosocial Services  No Follow up required No Follow up required         Psychosocial Discharge (Final Psychosocial Re-Evaluation):  Psychosocial Re-Evaluation - 02/15/24 0855       Psychosocial Re-Evaluation   Current issues with None Identified    Comments Jadin has not voiced any increased concerns or stressors during exercise at cardiac rehab    Interventions Encouraged to attend Cardiac Rehabilitation for the exercise;Relaxation education    Continue Psychosocial Services  No Follow up required          Vocational Rehabilitation: Provide vocational rehab assistance to qualifying candidates.   Vocational Rehab Evaluation & Intervention:  Vocational Rehab - 01/27/24 1415       Initial Vocational Rehab Evaluation & Intervention   Assessment shows need for Vocational Rehabilitation No   He is on short term disablility through his work.         Education: Education Goals: Education classes will be provided on a variety of topics geared toward better understanding of heart health and risk factor modification. Participant will state understanding/return demonstration of topics presented as noted by education test scores.  Learning Barriers/Preferences:  Learning Barriers/Preferences - 01/27/24 1413       Learning Barriers/Preferences   Learning Barriers None    Learning Preferences Skilled Demonstration;Pictoral;Written Material;Computer/Internet;Group Instruction;Individual Instruction           General Cardiac Education Topics:  AED/CPR: - Group verbal and written instruction with the use of models to demonstrate the basic use of the AED with the basic ABC's of resuscitation.   Test and Procedures: - Group verbal and visual presentation and models provide information about basic cardiac anatomy and function. Reviews the testing methods done to diagnose heart disease and the outcomes of the test results. Describes the treatment choices: Medical Management, Angioplasty, or Coronary Bypass Surgery for treating various heart conditions including Myocardial Infarction, Angina, Valve Disease, and Cardiac Arrhythmias. Written material provided at class time.   Medication Safety: - Group verbal and visual instruction to review commonly prescribed medications for heart and lung disease. Reviews the medication, class of the drug, and side effects. Includes the steps to properly store meds and maintain the prescription regimen. Written material provided at class time.   Intimacy: - Group verbal instruction through game format to discuss how heart and lung disease can affect sexual intimacy. Written material provided at class time.   Know Your Numbers and Heart Failure: - Group verbal and visual instruction to discuss disease risk factors for cardiac and pulmonary disease and treatment options.  Reviews associated critical values for Overweight/Obesity, Hypertension, Cholesterol, and Diabetes.  Discusses basics of heart failure: signs/symptoms and treatments.  Introduces Heart Failure Zone chart for action plan for heart failure. Written material provided at class time.   Infection Prevention: - Provides verbal and written material to individual with discussion of infection control including proper hand washing and proper equipment cleaning during exercise session.   Falls Prevention: - Provides verbal and written material to individual with discussion of falls prevention and  safety.   Other: -Provides group and verbal instruction on various topics (see comments)   Knowledge Questionnaire Score:  Knowledge Questionnaire Score - 01/27/24 1413       Knowledge Questionnaire Score   Pre Score 22/24  Core Components/Risk Factors/Patient Goals at Admission:  Personal Goals and Risk Factors at Admission - 01/27/24 1416       Core Components/Risk Factors/Patient Goals on Admission   Diabetes Yes    Intervention Provide education about signs/symptoms and action to take for hypo/hyperglycemia.;Provide education about proper nutrition, including hydration, and aerobic/resistive exercise prescription along with prescribed medications to achieve blood glucose in normal ranges: Fasting glucose 65-99 mg/dL    Expected Outcomes Short Term: Participant verbalizes understanding of the signs/symptoms and immediate care of hyper/hypoglycemia, proper foot care and importance of medication, aerobic/resistive exercise and nutrition plan for blood glucose control.;Long Term: Attainment of HbA1C < 7%.    Hypertension Yes    Intervention Provide education on lifestyle modifcations including regular physical activity/exercise, weight management, moderate sodium restriction and increased consumption of fresh fruit, vegetables, and low fat dairy, alcohol moderation, and smoking cessation.;Monitor prescription use compliance.    Expected Outcomes Short Term: Continued assessment and intervention until BP is < 140/31mm HG in hypertensive participants. < 130/44mm HG in hypertensive participants with diabetes, heart failure or chronic kidney disease.;Long Term: Maintenance of blood pressure at goal levels.    Lipids Yes    Intervention Provide education and support for participant on nutrition & aerobic/resistive exercise along with prescribed medications to achieve LDL 70mg , HDL >40mg .    Expected Outcomes Short Term: Participant states understanding of desired cholesterol values  and is compliant with medications prescribed. Participant is following exercise prescription and nutrition guidelines.;Long Term: Cholesterol controlled with medications as prescribed, with individualized exercise RX and with personalized nutrition plan. Value goals: LDL < 70mg , HDL > 40 mg.    Personal Goal Other Yes    Personal Goal S.T Get back to work and travel L.T Feel better than before, endurance    Intervention Will continue to monitor pt and progress workloads as tolerated without sign or symptom    Expected Outcomes Pt will achive his goals          Education:Diabetes - Individual verbal and written instruction to review signs/symptoms of diabetes, desired ranges of glucose level fasting, after meals and with exercise. Acknowledge that pre and post exercise glucose checks will be done for 3 sessions at entry of program.   Core Components/Risk Factors/Patient Goals Review:   Goals and Risk Factor Review     Row Name 01/31/24 0837 02/15/24 0856           Core Components/Risk Factors/Patient Goals Review   Personal Goals Review Diabetes;Heart Failure;Lipids;Stress Diabetes;Heart Failure;Lipids;Stress      Review Reynaldo started cardiac rehab on 01/31/24. Destan did well with exercise. Vital signs and CBG's were stable. Taesean continues to tolerate cardiac rehab exercise well and has increased his METs since starting on 01/31/24      Expected Outcomes Tavish will continue to participate in cardiac rehab for exercise, nutriton and lifestyle modifications Gregoire will continue to participate in cardiac rehab for exercise, nutriton and lifestyle modifications         Core Components/Risk Factors/Patient Goals at Discharge (Final Review):   Goals and Risk Factor Review - 02/15/24 0856       Core Components/Risk Factors/Patient Goals Review   Personal Goals Review Diabetes;Heart Failure;Lipids;Stress    Review Aaban continues to tolerate cardiac rehab exercise well and has increased his  METs since starting on 01/31/24    Expected Outcomes Green will continue to participate in cardiac rehab for exercise, nutriton and lifestyle modifications  ITP Comments:  ITP Comments     Row Name 01/27/24 1354 01/31/24 0835 02/15/24 0854       ITP Comments Dr. Wilbert turner medical director. Introduction to the [facility] education program/intensive cardiac rehab. Initial orientation packet reviewed with patient. 30 Day ITP Review. Allison started cardiac rehab on 01/31/24. Iban did well with exercise. 30 Day ITP Review. Kahlen is doing well with exercise at cardiac rehab.        Comments: see ITP comments

## 2024-02-23 ENCOUNTER — Ambulatory Visit: Admitting: Family Medicine

## 2024-02-23 ENCOUNTER — Encounter (HOSPITAL_COMMUNITY)
Admission: RE | Admit: 2024-02-23 | Discharge: 2024-02-23 | Disposition: A | Discharge status: Home / Self Care | Source: Ambulatory Visit | Attending: Cardiovascular Disease | Admitting: Cardiovascular Disease

## 2024-02-23 DIAGNOSIS — I214 Non-ST elevation (NSTEMI) myocardial infarction: Secondary | ICD-10-CM

## 2024-02-23 DIAGNOSIS — Z951 Presence of aortocoronary bypass graft: Secondary | ICD-10-CM

## 2024-02-23 NOTE — Telephone Encounter (Signed)
 FMLA form faxed to [place] [place], scanned into chart and e-mailed to patient.  Billing notified

## 2024-02-25 ENCOUNTER — Encounter: Payer: Self-pay | Admitting: Family Medicine

## 2024-02-25 ENCOUNTER — Encounter (HOSPITAL_COMMUNITY)
Admission: RE | Admit: 2024-02-25 | Discharge: 2024-02-25 | Disposition: A | Discharge status: Home / Self Care | Source: Ambulatory Visit | Attending: Cardiovascular Disease | Admitting: Cardiovascular Disease

## 2024-02-25 ENCOUNTER — Ambulatory Visit: Admitting: Family Medicine

## 2024-02-25 VITALS — BP 110/60 | HR 77 | Temp 97.9°F | Wt 169.0 lb

## 2024-02-25 DIAGNOSIS — E1165 Type 2 diabetes mellitus with hyperglycemia: Secondary | ICD-10-CM

## 2024-02-25 DIAGNOSIS — I214 Non-ST elevation (NSTEMI) myocardial infarction: Secondary | ICD-10-CM

## 2024-02-25 DIAGNOSIS — R7989 Other specified abnormal findings of blood chemistry: Secondary | ICD-10-CM | POA: Diagnosis not present

## 2024-02-25 DIAGNOSIS — E785 Hyperlipidemia, unspecified: Secondary | ICD-10-CM | POA: Diagnosis not present

## 2024-02-25 DIAGNOSIS — Z23 Encounter for immunization: Secondary | ICD-10-CM | POA: Diagnosis not present

## 2024-02-25 DIAGNOSIS — Z951 Presence of aortocoronary bypass graft: Secondary | ICD-10-CM

## 2024-02-25 NOTE — Progress Notes (Signed)
 Subjective:  Patient ID: Marcus Hart, male    DOB: 08-29-1965  Age: 58 y.o. MRN: [id number]  CC:  Chief Complaint  Patient presents with   Medical Management of Chronic Issues    1 month follow up  Review labs     HPI Marcus Hart presents for   Diabetes, Follow-up from 01/06/2024 visit.  Jardiance  has been added by my colleague in July.  Continue on metformin  1000 mg twice daily, Trulicity  3 mg weekly with improved A1c.  With his previous liver issues, hospitalization for elevated LFTs, his spouse had recommended he stop the Trulicity  as she had researched possible liver injury with that medication and he had recently had elevated LFTs.  He was still taking his Jardiance  and metformin  without mycotic or UTI symptoms.  Had readings at last visit 111-120 fasting 180-200 postprandial.  Endocrine referral was requested.  We did increase his dose of Jardiance  and recommended to consider restarting Trulicity  given elevated postprandials.  Back on trulicity  3mg .  since last visit with normal lfts last month. No mycotic/uti sx;s or se's from higher dose jardiance . On Home readings - 114 Postprandial - 130 range. Appt with endocrine in December.   Microalbumin: nl ratio 02/19/23.    Health maintenance: Declines flu vaccine Due for optho visit - had to reschedule as in hospital.  Declines pna vaccine. Agrees to Hep B vaccine - 1st dose today.   Lab Results  Component Value Date   HGBA1C 7.0 (H) 01/01/2024   HGBA1C 7.6 (H) 11/04/2023   HGBA1C 8.0 (H) 10/01/2023   Lab Results  Component Value Date   MICROALBUR <0.2 04/06/2015   LDLCALC 79 12/20/2023   CREATININE 0.69 01/06/2024   Elevated LFTs In regards to elevated LFTs, see last visit, hospitalization for elevated LFTs with question of acalculous cholecystitis, no acute cholecystitis noted on HIDA scan.  Did have thickened gallbladder wall on imaging previously.  Biliary source versus drug-induced  liver injury is working diagnoses.  His Crestor  had been increased from 5 to 40 mg in addition to Zetia  being added for risk modification from CAD.  MRCP on 817, partially decompressed gallbladder with gallbladder wall thickening measuring 4 mm but nonspecific.  Hemangioma in the left lobe of the liver at 1.5 cm without suspicious liver lesions. At his August visit he was not having any further pain nausea or vomiting, eating normally at that time.  Asymptomatic with prior elevated LFTs. Plan for outpatient gastroenterology follow-up.  Normal LFTs on September 2 labs. GI appointment September 3 noted.  Thought to be likely due to Zetia  and high-dose Crestor  and now on PCSK9 inhibitor with Repatha .  Doing well. No abd pain.     Lab Results  Component Value Date   ALT 16 01/18/2024   AST 17 01/18/2024   ALKPHOS 75 01/18/2024   BILITOT 0.5 01/18/2024   Immunization History  Administered Date(s) Administered   Hepb-cpg 02/25/2024   Influenza,inj,Quad PF,6+ Mos 02/13/2013, 05/07/2014, 04/06/2015   Influenza-Unspecified 02/13/2013, 05/07/2014, 04/06/2015   Meningococcal B Recombinant 09/24/2017   Meningococcal Conjugate 09/08/2014, 09/24/2017   Meningococcal Mcv4o 09/08/2014   Pneumococcal Polysaccharide-23 06/18/2017   Pneumococcal-Unspecified 06/18/2017   Tdap 02/13/2013, 02/19/2023   Zoster Recombinant(Shingrix ) 02/14/2021, 09/02/2021      History Patient Active Problem List   Diagnosis Date Noted   Abdominal pain 12/31/2023   Acute cholecystitis 12/31/2023   Elevated liver enzymes 12/30/2023   Elevated lipoprotein A level 11/30/2023   Pleural effusion 11/30/2023  S/P CABG x 2 11/08/2023   NSTEMI (non-ST elevated myocardial infarction) (HCC) 11/03/2023   Type 2 diabetes mellitus with hyperglycemia, without long-term current use of insulin  (HCC) 02/13/2013   Hyperlipidemia 02/13/2013   Past Medical History:  Diagnosis Date   Diabetes mellitus (HCC)    Diabetes mellitus  without complication (HCC)    Phreesia 11/10/2019   Hyperlipidemia    STEMI (ST elevation myocardial infarction) [facility])    Past Surgical History:  Procedure Laterality Date   COLONOSCOPY     CORONARY ARTERY BYPASS GRAFT N/A 11/08/2023   Procedure: CORONARY ARTERY BYPASS GRAFTING X TWO USING LEFT INTERNAL MAMMARY ARTERY AND LEFT RADIAL ARTERY;  Surgeon: Kerrin Elspeth BROCKS, MD;  Location: [facility] OR;  Service: Open Heart Surgery;  Laterality: N/A;   CORONARY PRESSURE/FFR STUDY N/A 11/04/2023   Procedure: CORONARY PRESSURE/FFR STUDY;  Surgeon: Elmira Newman PARAS, MD;  Location: [facility] INVASIVE CV LAB;  Service: Cardiovascular;  Laterality: N/A;   CORONARY ULTRASOUND/IVUS N/A 11/04/2023   Procedure: Coronary Ultrasound/IVUS;  Surgeon: Elmira Newman PARAS, MD;  Location: [facility] INVASIVE CV LAB;  Service: Cardiovascular;  Laterality: N/A;   INTRAOPERATIVE TRANSESOPHAGEAL ECHOCARDIOGRAM N/A 11/08/2023   Procedure: ECHOCARDIOGRAM, TRANSESOPHAGEAL, INTRAOPERATIVE;  Surgeon: Kerrin Elspeth BROCKS, MD;  Location: [facility] OR;  Service: Open Heart Surgery;  Laterality: N/A;   LEFT HEART CATH AND CORONARY ANGIOGRAPHY N/A 11/04/2023   Procedure: LEFT HEART CATH AND CORONARY ANGIOGRAPHY;  Surgeon: Elmira Newman PARAS, MD;  Location: [facility] INVASIVE CV LAB;  Service: Cardiovascular;  Laterality: N/A;   NO PAST SURGERIES     RADIAL ARTERY HARVEST Left 11/08/2023   Procedure: SURGICAL PROCUREMENT, ARTERY, RADIAL;  Surgeon: Kerrin Elspeth BROCKS, MD;  Location: [facility] OR;  Service: Open Heart Surgery;  Laterality: Left;   UPPER GASTROINTESTINAL ENDOSCOPY     Allergies  Allergen Reactions   Porcine (Pork) Protein-Containing Drug Products Other (See Comments)    Religious    Prior to Admission medications   Medication Sig Start Date End Date Taking? Authorizing Provider  aspirin  EC 81 MG tablet Take 81 mg by mouth daily. Swallow whole.   Yes [provider]  clopidogrel  (PLAVIX ) 75 MG tablet Take 1 tablet (75 mg total) by  mouth daily. 12/10/23  Yes Trudy Birmingham, PA-C  Dulaglutide  (TRULICITY ) 3 MG/0.5ML SOAJ Inject 3 mg into the skin once a week. 02/18/24  Yes Levora Reyes SAUNDERS, MD  empagliflozin  (JARDIANCE ) 25 MG TABS tablet Take 1 tablet (25 mg total) by mouth daily before breakfast. 01/06/24  Yes Levora Reyes SAUNDERS, MD  Evolocumab  (REPATHA  SURECLICK) 140 MG/ML SOAJ Inject 140 mg into the skin every 14 (fourteen) days. 01/13/24  Yes Wonda Sharper, MD  Insulin  Pen Needle (PEN NEEDLES) 32G X 4 MM MISC Use with Victoza  to inject once daily 06/30/21  Yes Levora Reyes SAUNDERS, MD  Lancets [facility] DELICA PLUS [place]) MISC USE AS DIRECTED TO TEST BLOOD GLUCOSE 11/03/21  Yes Levora Reyes SAUNDERS, MD  metFORMIN  (GLUCOPHAGE ) 1000 MG tablet Take 1 tablet (1,000 mg total) by mouth 2 (two) times daily with a meal. 12/16/23  Yes Levora Reyes SAUNDERS, MD  metoprolol  tartrate (LOPRESSOR ) 25 MG tablet Take 1/2 tablets (12.5 mg total) by mouth 2 (two) times daily. 01/07/24  Yes Wonda Sharper, MD  Multiple Vitamins-Minerals (MULTIVITAMIN WITH MINERALS) tablet Take 1 tablet by mouth daily.   Yes [provider]  nitroGLYCERIN  (NITROSTAT ) 0.4 MG SL tablet Place 1 tablet (0.4 mg total) under the tongue every 5 (five) minutes as needed for chest pain. If  you need more than 3 doses in 24 hours, call 911 or go to nearest ER 12/29/23  Yes Wonda Sharper, MD  predniSONE  (DELTASONE ) 20 MG tablet Take 1 tablet (20 mg total) by mouth daily in case of gout flare-must follow-up with primary care physician if does not resolve with this 01/02/24  Yes Samtani, Jai-Gurmukh, MD  Study - SOS-AMI - selatogrel 16 mg/0.5 mL or placebo SQ injection (PI-Christopher) Inject 0.5 mLs (16 mg total) into the skin as needed (As needed for for symptoms of a heart attack (chest pain).). 12/29/23  Yes Lonni Slain, MD  amLODipine  (NORVASC ) 5 MG tablet Take 1 tablet (5 mg total) by mouth daily. Patient taking differently: Take 5 mg by mouth daily. Cardiologist  is now aware patient is not taking medication. He took himself off this medication 11/13/23   Marcus Con RAMAN, PA-C   Social History   Socioeconomic History   Marital status: Married    Spouse name: Marcus Hart   Number of children: 4   Years of education: Not on file   Highest education level: Bachelor's degree (e.g., BA, AB, BS)  Occupational History   Occupation: Designer, television/film set  Tobacco Use   Smoking status: Never   Smokeless tobacco: Never  Vaping Use   Vaping status: Never Used  Substance and Sexual Activity   Alcohol use: Never    Alcohol/week: 0.0 standard drinks of alcohol   Drug use: No   Sexual activity: Yes  Other Topics Concern   Not on file  Social History Narrative   Married   Education: Automotive engineer   Exercise: No   Social Drivers of Corporate investment banker Strain: Medium Risk (06/18/2023)   Overall Financial Resource Strain (CARDIA)    Difficulty of Paying Living Expenses: Somewhat hard  Food Insecurity: No Food Insecurity (12/31/2023)   Hunger Vital Sign    Worried About Running Out of Food in the Last Year: Never true    Ran Out of Food in the Last Year: Never true  Recent Concern: Food Insecurity - Food Insecurity Present (11/06/2023)   Hunger Vital Sign    Worried About Running Out of Food in the Last Year: Sometimes true    Ran Out of Food in the Last Year: Sometimes true  Transportation Needs: No Transportation Needs (12/31/2023)   PRAPARE - Administrator, Civil Service (Medical): No    Lack of Transportation (Non-Medical): No  Physical Activity: Insufficiently Active (06/18/2023)   Exercise Vital Sign    Days of Exercise per Week: 1 day    Minutes of Exercise per Session: 30 min  Stress: No Stress Concern Present (06/18/2023)   [employer] of Occupational Health - Occupational Stress Questionnaire    Feeling of Stress : Not at all  Social Connections: Socially Integrated (12/31/2023)   Social Connection and Isolation Panel    Frequency of  Communication with Friends and Family: Twice a week    Frequency of Social Gatherings with Friends and Family: Twice a week    Attends Religious Services: 1 to 4 times per year    Active Member of [employer] or Organizations: Yes    Attends Banker Meetings: 1 to 4 times per year    Marital Status: Married  Catering manager Violence: Not At Risk (12/31/2023)   Humiliation, Afraid, Rape, and Kick questionnaire    Fear of Current or Ex-Partner: No    Emotionally Abused: No    Physically Abused: No    Sexually Abused:  No    Review of Systems  Constitutional:  Negative for fatigue and unexpected weight change.  Eyes:  Negative for visual disturbance.  Respiratory:  Negative for cough, chest tightness and shortness of breath.   Cardiovascular:  Negative for chest pain, palpitations and leg swelling.  Gastrointestinal:  Negative for abdominal pain, blood in stool, nausea and vomiting.  Neurological:  Negative for dizziness, light-headedness and headaches.     Objective:   Vitals:   02/25/24 1118  BP: 110/60  Pulse: 77  Temp: 97.9 F (36.6 C)  TempSrc: Temporal  SpO2: 100%  Weight: 169 lb (76.7 kg)     Physical Exam Vitals reviewed.  Constitutional:      Appearance: He is well-developed.  HENT:     Head: Normocephalic and atraumatic.  Neck:     Vascular: No carotid bruit or JVD.  Cardiovascular:     Rate and Rhythm: Normal rate and regular rhythm.     Heart sounds: Normal heart sounds. No murmur heard. Pulmonary:     Effort: Pulmonary effort is normal.     Breath sounds: Normal breath sounds. No rales.  Musculoskeletal:     Right lower leg: No edema.     Left lower leg: No edema.  Skin:    General: Skin is warm and dry.  Neurological:     Mental Status: He is alert and oriented to person, place, and time.  Psychiatric:        Mood and Affect: Mood normal.        Assessment & Plan:  Marcus Hart is a 58 y.o. male . Type 2 diabetes  mellitus with hyperglycemia, without long-term current use of insulin  (HCC) - Plan: Microalbumin / creatinine urine ratio, Comprehensive metabolic panel with GFR, Hemoglobin A1c  - Tolerating current med regimen including back on GLP-1 as above.  No new side effects with higher dose of SGLT2.  Improved 10 readings.  Not quite due for A1c yet, will pend labs for lab only visit in 6 weeks.  Does plan on following up with endocrinology in the next few months, but can adjust regimen if needed prior to that time based on lab results.  If he does follow-up with endocrinology, then he can just follow-up with me for physical next year.  Elevated LFTs - Plan: Comprehensive metabolic panel with GFR  - Normalized on repeat testing, possibly due to ezetimibe , statin high dose previously.  Now on Repatha  as below.  Continue follow-up with cardiology.  Hyperlipidemia, unspecified hyperlipidemia type  - Now on Repatha , follow-up labs, monitoring per cardiology.  Need for hepatitis B vaccination - Plan: Heplisav-B (HepB-CPG) Vaccine   Initial dose given, repeat at nurse visit in 6 weeks.  No orders of the defined types were placed in this encounter.  Patient Instructions  Thank you for coming in today.  Glad to hear that you are tolerating current medicines and great news about the recent normal liver function testing  It does look like the previous issue was probably from Zetia  and the statin.  Repatha  should be tolerated much better.  Keep follow-up with cardiology regarding repeat testing for cholesterol.    You are little bit early for repeat test of diabetes today.  Let's do a lab only visit and nurse visit in 6 weeks for your electrolytes, kidney, liver tests and A1c at that time, then follow-up with endocrinology as planned.  First dose of hepatitis B vaccine was given today and the second dose can  be given at that 6-week nurse/lab visit.  Let me know if there are any questions in the meantime and take  care!  Okay to follow-up with me in 8 months for your next physical since you will be followed by endocrinology for diabetes and other meds by cardiology.  Happy to see you sooner if needed, let me know if there are questions.    Signed,   Reyes Pines, MD [facility], [facility] 02/25/24 1:54 PM

## 2024-02-25 NOTE — Patient Instructions (Addendum)
 Thank you for coming in today.  Glad to hear that you are tolerating current medicines and great news about the recent normal liver function testing  It does look like the previous issue was probably from Zetia  and the statin.  Repatha  should be tolerated much better.  Keep follow-up with cardiology regarding repeat testing for cholesterol.    You are little bit early for repeat test of diabetes today.  Let's do a lab only visit and nurse visit in 6 weeks for your electrolytes, kidney, liver tests and A1c at that time, then follow-up with endocrinology as planned.  First dose of hepatitis B vaccine was given today and the second dose can be given at that 6-week nurse/lab visit.  Let me know if there are any questions in the meantime and take care!  Okay to follow-up with me in 8 months for your next physical since you will be followed by endocrinology for diabetes and other meds by cardiology.  Happy to see you sooner if needed, let me know if there are questions.

## 2024-02-28 ENCOUNTER — Encounter (HOSPITAL_COMMUNITY)
Admission: RE | Admit: 2024-02-28 | Discharge: 2024-02-28 | Disposition: A | Discharge status: Home / Self Care | Source: Ambulatory Visit | Attending: Cardiovascular Disease | Admitting: Cardiovascular Disease

## 2024-02-28 DIAGNOSIS — I214 Non-ST elevation (NSTEMI) myocardial infarction: Secondary | ICD-10-CM

## 2024-02-28 DIAGNOSIS — Z951 Presence of aortocoronary bypass graft: Secondary | ICD-10-CM

## 2024-03-01 ENCOUNTER — Encounter (HOSPITAL_COMMUNITY)
Admission: RE | Admit: 2024-03-01 | Discharge: 2024-03-01 | Disposition: A | Source: Ambulatory Visit | Attending: Cardiovascular Disease

## 2024-03-01 DIAGNOSIS — Z951 Presence of aortocoronary bypass graft: Secondary | ICD-10-CM

## 2024-03-01 DIAGNOSIS — I214 Non-ST elevation (NSTEMI) myocardial infarction: Secondary | ICD-10-CM

## 2024-03-03 ENCOUNTER — Encounter (HOSPITAL_COMMUNITY)
Admission: RE | Admit: 2024-03-03 | Discharge: 2024-03-03 | Disposition: A | Source: Ambulatory Visit | Attending: Cardiovascular Disease

## 2024-03-03 DIAGNOSIS — Z951 Presence of aortocoronary bypass graft: Secondary | ICD-10-CM

## 2024-03-03 DIAGNOSIS — I214 Non-ST elevation (NSTEMI) myocardial infarction: Secondary | ICD-10-CM | POA: Diagnosis not present

## 2024-03-06 ENCOUNTER — Encounter (HOSPITAL_COMMUNITY)
Admission: RE | Admit: 2024-03-06 | Discharge: 2024-03-06 | Disposition: A | Discharge status: Home / Self Care | Source: Ambulatory Visit | Attending: Cardiovascular Disease | Admitting: Cardiovascular Disease

## 2024-03-06 DIAGNOSIS — I214 Non-ST elevation (NSTEMI) myocardial infarction: Secondary | ICD-10-CM

## 2024-03-06 DIAGNOSIS — Z951 Presence of aortocoronary bypass graft: Secondary | ICD-10-CM

## 2024-03-08 ENCOUNTER — Encounter (HOSPITAL_COMMUNITY)
Admission: RE | Admit: 2024-03-08 | Discharge: 2024-03-08 | Disposition: A | Source: Ambulatory Visit | Attending: Cardiovascular Disease

## 2024-03-08 DIAGNOSIS — Z951 Presence of aortocoronary bypass graft: Secondary | ICD-10-CM

## 2024-03-08 DIAGNOSIS — I214 Non-ST elevation (NSTEMI) myocardial infarction: Secondary | ICD-10-CM | POA: Diagnosis not present

## 2024-03-10 ENCOUNTER — Encounter (HOSPITAL_COMMUNITY)
Admission: RE | Admit: 2024-03-10 | Discharge: 2024-03-10 | Disposition: A | Discharge status: Home / Self Care | Source: Ambulatory Visit | Attending: Cardiovascular Disease | Admitting: Cardiovascular Disease

## 2024-03-10 DIAGNOSIS — I214 Non-ST elevation (NSTEMI) myocardial infarction: Secondary | ICD-10-CM | POA: Diagnosis not present

## 2024-03-10 DIAGNOSIS — Z951 Presence of aortocoronary bypass graft: Secondary | ICD-10-CM

## 2024-03-13 ENCOUNTER — Encounter (HOSPITAL_COMMUNITY)
Admission: RE | Admit: 2024-03-13 | Discharge: 2024-03-13 | Disposition: A | Discharge status: Home / Self Care | Source: Ambulatory Visit | Attending: Cardiovascular Disease | Admitting: Cardiovascular Disease

## 2024-03-13 DIAGNOSIS — I214 Non-ST elevation (NSTEMI) myocardial infarction: Secondary | ICD-10-CM

## 2024-03-13 DIAGNOSIS — Z951 Presence of aortocoronary bypass graft: Secondary | ICD-10-CM

## 2024-03-15 ENCOUNTER — Encounter (HOSPITAL_COMMUNITY)
Admission: RE | Admit: 2024-03-15 | Discharge: 2024-03-15 | Disposition: A | Discharge status: Home / Self Care | Source: Ambulatory Visit | Attending: Cardiovascular Disease | Admitting: Cardiovascular Disease

## 2024-03-15 DIAGNOSIS — I214 Non-ST elevation (NSTEMI) myocardial infarction: Secondary | ICD-10-CM | POA: Diagnosis not present

## 2024-03-15 DIAGNOSIS — Z951 Presence of aortocoronary bypass graft: Secondary | ICD-10-CM

## 2024-03-17 ENCOUNTER — Encounter (HOSPITAL_COMMUNITY)
Admission: RE | Admit: 2024-03-17 | Discharge: 2024-03-17 | Disposition: A | Discharge status: Home / Self Care | Source: Ambulatory Visit | Attending: Cardiovascular Disease | Admitting: Cardiovascular Disease

## 2024-03-17 ENCOUNTER — Encounter: Payer: Self-pay | Admitting: Cardiovascular Disease

## 2024-03-17 ENCOUNTER — Ambulatory Visit: Admitting: Cardiovascular Disease

## 2024-03-17 VITALS — BP 101/64 | HR 71 | Resp 16 | Ht 69.0 in | Wt 164.2 lb

## 2024-03-17 DIAGNOSIS — Z951 Presence of aortocoronary bypass graft: Secondary | ICD-10-CM | POA: Insufficient documentation

## 2024-03-17 DIAGNOSIS — E782 Mixed hyperlipidemia: Secondary | ICD-10-CM | POA: Insufficient documentation

## 2024-03-17 DIAGNOSIS — I214 Non-ST elevation (NSTEMI) myocardial infarction: Secondary | ICD-10-CM | POA: Diagnosis not present

## 2024-03-17 DIAGNOSIS — E7841 Elevated Lipoprotein(a): Secondary | ICD-10-CM | POA: Insufficient documentation

## 2024-03-17 DIAGNOSIS — E1165 Type 2 diabetes mellitus with hyperglycemia: Secondary | ICD-10-CM | POA: Insufficient documentation

## 2024-03-17 NOTE — Patient Instructions (Signed)
 Medication Instructions:  No medication changes were made at this visit. Continue current regimen.   *If you need a refill on your cardiac medications before your next appointment, please call your pharmacy*  Lab Work: None ordered today. If you have labs (blood work) drawn today and your tests are completely normal, you will receive your results only by: MyChart Message (if you have MyChart) OR A paper copy in the mail If you have any lab test that is abnormal or we need to change your treatment, we will call you to review the results.  Testing/Procedures: None ordered today.  Follow-Up: At [facility], you and your health needs are our priority.  As part of our continuing mission to provide you with exceptional heart care, our providers are all part of one team.  This team includes your primary Cardiologist (physician) and Advanced Practice Providers or APPs (Physician Assistants and Nurse Practitioners) who all work together to provide you with the care you need, when you need it.  Your next appointment:   6 month(s)  Provider:   APP

## 2024-03-17 NOTE — Assessment & Plan Note (Signed)
 The patient is now treated with evolocumab .  He developed markedly elevated LFTs with rosuvastatin  and ezetimibe .  Continue current management.

## 2024-03-17 NOTE — Assessment & Plan Note (Signed)
 Patient reports some residual dizziness.  He is going to see an endocrinologist soon.  While there is some cardiac benefit to Jardiance , I wonder if this could be contributing to his symptoms.  He will review when he sees endocrinology in the near future.  He is also treated with Trulicity  and metformin .

## 2024-03-17 NOTE — Assessment & Plan Note (Signed)
 No anginal symptoms since surgery.  Continue DAPT with aspirin  and clopidogrel  since he presented with non-STEMI.  Continue Repatha .

## 2024-03-17 NOTE — Progress Notes (Signed)
 Cardiology Office Note:    Date:  03/17/2024   ID:  Cyrena Kluver osman Kertesz, DOB Mar 04, 1966, MRN [id number]  PCP:  Levora Reyes SAUNDERS, MD   [facility] Providers Cardiologist:  Ozell Fell, MD     Referring MD: Levora Reyes SAUNDERS, MD   Chief Complaint  Patient presents with   Follow-up    3 months    History of Present Illness:    Ashwath Kluver ludwig Prats is a 58 y.o. male presenting for follow-up of coronary artery disease.  The patient underwent CABG in June 2025 after presenting with non-STEMI.  He was found to have severe two-vessel CAD and underwent CABG with a LIMA to LAD and radial artery to obtuse marginal.  He was noted to have elevated LP(a) of 380 mg/dL at the time of his presentation.  He was also diagnosed with diabetes.  He developed marked elevation of LFTs on rosuvastatin  and ezetimibe .  He was subsequently started on Repatha .  The patient is here with his wife today. 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 He reports feeling very tired at the end of the work day. No chest pain or pressure with physical activity. No edema or calf claudication symptoms. He has stopped amlodipine  and metoprolol  due to low blood pressure and associated dizziness.    Current Medications: Current Meds  Medication Sig   aspirin  EC 81 MG tablet Take 81 mg by mouth daily. Swallow whole.   clopidogrel  (PLAVIX ) 75 MG tablet Take 1 tablet (75 mg total) by mouth daily.   Dulaglutide  (TRULICITY ) 3 MG/0.5ML SOAJ Inject 3 mg into the skin once a week.   empagliflozin  (JARDIANCE ) 25 MG TABS tablet Take 1 tablet (25 mg total) by mouth daily before breakfast.   Evolocumab  (REPATHA  SURECLICK) 140 MG/ML SOAJ Inject 140 mg into the skin every 14 (fourteen) days.   Insulin  Pen Needle (PEN NEEDLES) 32G X 4 MM MISC Use with Victoza  to inject once daily   Lancets (ONETOUCH DELICA PLUS LANCET33G) MISC USE AS DIRECTED TO TEST BLOOD GLUCOSE   metFORMIN   (GLUCOPHAGE ) 1000 MG tablet Take 1 tablet (1,000 mg total) by mouth 2 (two) times daily with a meal.   Multiple Vitamins-Minerals (MULTIVITAMIN WITH MINERALS) tablet Take 1 tablet by mouth daily.   nitroGLYCERIN  (NITROSTAT ) 0.4 MG SL tablet Place 1 tablet (0.4 mg total) under the tongue every 5 (five) minutes as needed for chest pain. If you need more than 3 doses in 24 hours, call 911 or go to nearest ER   Study - SOS-AMI - selatogrel 16 mg/0.5 mL or placebo SQ injection (PI-Christopher) Inject 0.5 mLs (16 mg total) into the skin as needed (As needed for for symptoms of a heart attack (chest pain).).     Allergies:   Porcine (pork) protein-containing drug products   ROS:   Please see the history of present illness.    All other systems reviewed and are negative.  EKGs/Labs/Other Studies Reviewed:    The following studies were reviewed today: Cardiac Studies & Procedures   ______________________________________________________________________________________________ CARDIAC CATHETERIZATION  CARDIAC CATHETERIZATION 11/04/2023  Conclusion Images from the original result were not included. Coronary angiography 11/04/2023: LM: Essentially non existent with separate dual ostia to LAD and LCx LAD: Medium caliber vessel Severe dampening with 6 French EBU 3.0 guide engagement Ostial 70% stenosis, CSA 5.1 mm2 Another lesion under appreciated angiographically, but noted on IVUS about 26 mm from the ostium with severe 180 deg  calcification and CSA 3.2 mm2 RFR in mid LAD 0.83. Significant gradient was in prox-mid LAD, not in ostial LAD LCx: Large dominant vessel Mid to distal 99% stenosis (likely culprit) with TIMI I flow distally RCA: Nondominant RV marginal ostial 90% stenosis  LVEDP 14 mmHg    Challenging decision making re: revascularization given concern for severe LAD disease, in addition to likely culprit lesion in large dominant LCx, in a 58 y/o diabetic patient with NSTEMI and  normal LVEF. He is currently chest pain free. Recommend Aspirin , IV heparin , IV nitroglycerin . In case there is any acute chest pain overnight with EKG changes concerning for posterior injury, we could consider urgent revascularization to Lcx. If patient stays stable, I will discuss the case in multidisciplinary heart team meeting tomorrow morning if he could benefit from CABG better.  Newman JINNY Lawrence, MD  Findings Coronary Findings Diagnostic  Dominance: Left  Left Anterior Descending Ost LAD lesion is 70% stenosed. Prox LAD to Mid LAD lesion is 50% stenosed. The lesion is moderately calcified. Pressure gradient was performed on the lesion. RFR: 0.83. Ultrasound (IVUS) was performed. Cross-sectional area: 3.2 mm.  IVUS has determined that the lesion is calcified.  Left Circumflex Mid Cx to Dist Cx lesion is 99% stenosed.  Right Coronary Artery  Right Ventricular Branch RV Branch lesion is 90% stenosed.  Intervention  No interventions have been documented.     ECHOCARDIOGRAM  ECHOCARDIOGRAM COMPLETE 11/04/2023  Narrative ECHOCARDIOGRAM REPORT    Patient Name:   LEVORN OLESKI Date of Exam: 11/04/2023 Medical Rec #:  [id number]      Height:       70.7 in Accession #:    [id number]     Weight:       171.3 lb Date of Birth:  [birth year]       BSA:          1.969 m Patient Age:    58 years       BP:           96/63 mmHg Patient Gender: M              HR:           63 bpm. Exam Location:  Inpatient  Procedure: 2D Echo, Cardiac Doppler, Color Doppler and Saline Contrast Bubble Study (Both Spectral and Color Flow Doppler were utilized during procedure).  Indications:    NSTEMI  History:        Patient has prior history of Echocardiogram examinations and Patient has no prior history of Echocardiogram examinations.  Sonographer:    Therisa Crouch Referring Phys: [id number] GEORGANNA ARCHER  IMPRESSIONS   1. Left ventricular ejection fraction, by estimation, is 55 to 60%.  The left ventricle has normal function. The left ventricle has no regional wall motion abnormalities. Left ventricular diastolic parameters were normal. 2. Right ventricular systolic function is normal. The right ventricular size is normal. 3. The mitral valve is normal in structure. No evidence of mitral valve regurgitation. No evidence of mitral stenosis. 4. The aortic valve is normal in structure. There is mild calcification of the aortic valve. Aortic valve regurgitation is not visualized. No aortic stenosis is present. 5. The inferior vena cava is normal in size with greater than 50% respiratory variability, suggesting right atrial pressure of 3 mmHg.  FINDINGS Left Ventricle: Left ventricular ejection fraction, by estimation, is 55 to 60%. The left ventricle has normal function. The left ventricle has no regional wall motion abnormalities. The left ventricular  internal cavity size was normal in size. There is no left ventricular hypertrophy. Left ventricular diastolic parameters were normal.  Right Ventricle: The right ventricular size is normal. No increase in right ventricular wall thickness. Right ventricular systolic function is normal.  Left Atrium: Left atrial size was normal in size.  Right Atrium: Right atrial size was normal in size.  Pericardium: There is no evidence of pericardial effusion.  Mitral Valve: The mitral valve is normal in structure. No evidence of mitral valve regurgitation. No evidence of mitral valve stenosis.  Tricuspid Valve: The tricuspid valve is normal in structure. Tricuspid valve regurgitation is not demonstrated. No evidence of tricuspid stenosis.  Aortic Valve: The aortic valve is normal in structure. There is mild calcification of the aortic valve. Aortic valve regurgitation is not visualized. No aortic stenosis is present.  Pulmonic Valve: The pulmonic valve was normal in structure. Pulmonic valve regurgitation is not visualized. No evidence of  pulmonic stenosis.  Aorta: The aortic root is normal in size and structure.  Venous: The inferior vena cava is normal in size with greater than 50% respiratory variability, suggesting right atrial pressure of 3 mmHg.  IAS/Shunts: No atrial level shunt detected by color flow Doppler. Agitated saline contrast was given intravenously to evaluate for intracardiac shunting.   LEFT VENTRICLE PLAX 2D LVIDd:         4.50 cm      Diastology LVIDs:         2.80 cm      LV e' medial:    6.20 cm/s LV PW:         0.80 cm      LV E/e' medial:  9.2 LV IVS:        0.70 cm      LV e' lateral:   12.30 cm/s LVOT diam:     2.20 cm      LV E/e' lateral: 4.6 LVOT Area:     3.80 cm  LV Volumes (MOD) LV vol d, MOD A2C: 105.0 ml LV vol d, MOD A4C: 77.4 ml LV vol s, MOD A2C: 44.2 ml LV vol s, MOD A4C: 40.4 ml LV SV MOD A2C:     60.8 ml LV SV MOD A4C:     77.4 ml LV SV MOD BP:      55.0 ml  RIGHT VENTRICLE             IVC RV Basal diam:  3.10 cm     IVC diam: 1.00 cm RV S prime:     10.60 cm/s TAPSE (M-mode): 2.3 cm  LEFT ATRIUM             Index LA diam:        2.70 cm 1.37 cm/m LA Vol (A2C):   36.3 ml 18.43 ml/m LA Vol (A4C):   30.5 ml 15.49 ml/m LA Biplane Vol: 35.2 ml 17.87 ml/m  AORTA Ao Root diam: 3.60 cm Ao Asc diam:  3.10 cm  MITRAL VALVE MV Area (PHT): 3.95 cm    SHUNTS MV Decel Time: 192 msec    Systemic Diam: 2.20 cm MV E velocity: 57.10 cm/s MV A velocity: 64.50 cm/s MV E/A ratio:  0.89  Aditya Sabharwal Electronically signed by Ria Commander Signature Date/Time: 11/04/2023/1:12:10 PM    Final   TEE  ECHO INTRAOPERATIVE TEE 11/08/2023  Narrative *INTRAOPERATIVE TRANSESOPHAGEAL REPORT *    Patient Name:   YOHANCE HATHORNE Date of Exam: 11/08/2023 Medical Rec #:  [id number]  Height:       70.0 in Accession #:    [id number]     Weight:       164.2 lb Date of Birth:  Aug 30, 1965       BSA:          1.92 m Patient Age:    58 years       BP:           131/76  mmHg Patient Gender: M              HR:           77 bpm. Exam Location:  Anesthesiology  Transesophogeal exam was perform intraoperatively during surgical procedure. Patient was closely monitored under general anesthesia during the entirety of examination.  Indications:     I25.110 Atherosclerotic heart disease of native coronary artery with unstable angina pectoris Performing Phys: Bernardino Mango, MD  Complications: No known complications during this procedure. POST-OP IMPRESSIONS Overall, there were no significant changes from pre-bypass. _ Left Ventricle: The left ventricle is unchanged from pre-bypass. _ Right Ventricle: The right ventricle appears unchanged from pre-bypass. _ Aorta: The aorta appears unchanged from pre-bypass. _ Left Atrium: The left atrium appears unchanged from pre-bypass. _ Left Atrial Appendage: The left atrial appendage appears unchanged from pre-bypass. _ Aortic Valve: The aortic valve appears unchanged from pre-bypass. _ Mitral Valve: The mitral valve appears unchanged from pre-bypass. _ Tricuspid Valve: The tricuspid valve appears unchanged from pre-bypass. _ Pulmonic Valve: The pulmonic valve appears unchanged from pre-bypass. _ Interatrial Septum: The interatrial septum appears unchanged from pre-bypass. _ Interventricular Septum: The interventricular septum appears unchanged from pre-bypass. _ Pericardium: The pericardium appears unchanged from pre-bypass.  PRE-OP FINDINGS Left Ventricle: The left ventricle has mildly reduced systolic function, with an ejection fraction of 45-50%. The cavity size was normal. There is no increase in left ventricular wall thickness.  Right Ventricle: The right ventricle has normal systolic function. The cavity was normal. There is no increase in right ventricular wall thickness.  Left Atrium: Left atrial size was normal in size. No left atrial/left atrial appendage thrombus was detected.  Right Atrium: Right atrial  size was normal in size.  Interatrial Septum: No atrial level shunt detected by color flow Doppler.  Pericardium: There is no evidence of pericardial effusion.  Mitral Valve: The mitral valve is normal in structure. Mitral valve regurgitation is trivial by color flow Doppler.  Tricuspid Valve: The tricuspid valve was normal in structure. Tricuspid valve regurgitation is trivial by color flow Doppler.  Aortic Valve: The aortic valve is normal in structure. Aortic valve regurgitation is trivial by color flow Doppler.  Pulmonic Valve: The pulmonic valve was normal in structure. Pulmonic valve regurgitation is not visualized by color flow Doppler.    Bernardino Mango MD Electronically signed by Bernardino Mango MD Signature Date/Time: 11/08/2023/4:35:29 PM    Final        ______________________________________________________________________________________________      EKG:        Recent Labs: 11/04/2023: TSH 1.231 01/02/2024: Magnesium  1.8 01/06/2024: BUN 18; Creatinine, Ser 0.69; Hemoglobin 11.9; Platelets 176.0; Potassium 4.3; Sodium 137 01/18/2024: ALT 16  Recent Lipid Panel    Component Value Date/Time   CHOL 147 12/20/2023 0844   TRIG 67 12/20/2023 0844   HDL 55 12/20/2023 0844   CHOLHDL 2.7 12/20/2023 0844   CHOLHDL 2.6 11/04/2023 0356   VLDL 13 11/04/2023 0356   LDLCALC 79 12/20/2023 0844     Risk Assessment/Calculations:  Physical Exam:    VS:  BP 101/64 (BP Location: Left Arm, Patient Position: Sitting, Cuff Size: Normal)   Pulse 71   Resp 16   Ht 5' 9 (1.753 m)   Wt 164 lb 3.2 oz (74.5 kg)   SpO2 100%   BMI 24.25 kg/m     Wt Readings from Last 3 Encounters:  03/17/24 164 lb 3.2 oz (74.5 kg)  02/25/24 169 lb (76.7 kg)  01/27/24 165 lb 9.1 oz (75.1 kg)     GEN:  Well nourished, well developed thin male in no acute distress HEENT: Normal NECK: No JVD; No carotid bruits LYMPHATICS: No lymphadenopathy CARDIAC: RRR, no murmurs,  rubs, gallops RESPIRATORY:  Clear to auscultation without rales, wheezing or rhonchi  ABDOMEN: Soft, non-tender, non-distended MUSCULOSKELETAL:  No edema; No deformity  SKIN: Warm and dry NEUROLOGIC:  Alert and oriented x 3 PSYCHIATRIC:  Normal affect   Assessment & Plan S/P CABG x 2 No anginal symptoms since surgery.  Continue DAPT with aspirin  and clopidogrel  since he presented with non-STEMI.  Continue Repatha . Elevated lipoprotein(a) Now on a PCSK9 inhibitor.  Continue DAPT with aspirin  and clopidogrel . Type 2 diabetes mellitus with hyperglycemia, without long-term current use of insulin  [facility]) Patient reports some residual dizziness.  He is going to see an endocrinologist soon.  While there is some cardiac benefit to Jardiance , I wonder if this could be contributing to his symptoms.  He will review when he sees endocrinology in the near future.  He is also treated with Trulicity  and metformin . Mixed hyperlipidemia The patient is now treated with evolocumab .  He developed markedly elevated LFTs with rosuvastatin  and ezetimibe .  Continue current management.  Overall the patient is doing fairly well.  He is having some problems with fatigue and request that FMLA paperwork be filled out in case he needs to miss work on occasion when he is feeling poorly.  I think this is reasonable while he continues to adjust to his medical regimen following cardiac surgery.          Medication Adjustments/Labs and Tests Ordered: Current medicines are reviewed at length with the patient today.  Concerns regarding medicines are outlined above.  No orders of the defined types were placed in this encounter.  No orders of the defined types were placed in this encounter.   Patient Instructions  Medication Instructions:  No medication changes were made at this visit. Continue current regimen.   *If you need a refill on your cardiac medications before your next appointment, please call your  pharmacy*  Lab Work: None ordered today. If you have labs (blood work) drawn today and your tests are completely normal, you will receive your results only by: MyChart Message (if you have MyChart) OR A paper copy in the mail If you have any lab test that is abnormal or we need to change your treatment, we will call you to review the results.  Testing/Procedures: None ordered today.  Follow-Up: At [facility], you and your health needs are our priority.  As part of our continuing mission to provide you with exceptional heart care, our providers are all part of one team.  This team includes your primary Cardiologist (physician) and Advanced Practice Providers or APPs (Physician Assistants and Nurse Practitioners) who all work together to provide you with the care you need, when you need it.  Your next appointment:   6 month(s)  Provider:   APP      Signed, Ozell Fell, MD  03/17/2024 12:45 PM    [facility]

## 2024-03-17 NOTE — Assessment & Plan Note (Signed)
 Now on a PCSK9 inhibitor.  Continue DAPT with aspirin  and clopidogrel .

## 2024-03-20 ENCOUNTER — Encounter (HOSPITAL_COMMUNITY)
Admission: RE | Admit: 2024-03-20 | Discharge: 2024-03-20 | Disposition: A | Discharge status: Home / Self Care | Source: Ambulatory Visit | Attending: Cardiovascular Disease | Admitting: Cardiovascular Disease

## 2024-03-20 DIAGNOSIS — I214 Non-ST elevation (NSTEMI) myocardial infarction: Secondary | ICD-10-CM | POA: Diagnosis present

## 2024-03-20 DIAGNOSIS — Z48812 Encounter for surgical aftercare following surgery on the circulatory system: Secondary | ICD-10-CM | POA: Insufficient documentation

## 2024-03-20 DIAGNOSIS — Z951 Presence of aortocoronary bypass graft: Secondary | ICD-10-CM | POA: Diagnosis present

## 2024-03-20 DIAGNOSIS — Z006 Encounter for examination for normal comparison and control in clinical research program: Secondary | ICD-10-CM

## 2024-03-20 DIAGNOSIS — I252 Old myocardial infarction: Secondary | ICD-10-CM | POA: Diagnosis not present

## 2024-03-20 NOTE — Progress Notes (Signed)
 Cardiac Individual Treatment Plan  Patient Details  Name: Sierra Bissonette Howells MRN: [id number] Date of Birth: [birth year] Referring Provider:   Flowsheet Row CARDIAC REHAB PHASE II ORIENTATION from 01/27/2024 in [facility] for Heart, Vascular, & Lung Health  Referring Provider Dr. Ozell Fell NP    Initial Encounter Date:  Flowsheet Row CARDIAC REHAB PHASE II ORIENTATION from 01/27/2024 in [facility] for Heart, Vascular, & Lung Health  Date 01/27/24    Visit Diagnosis: S/P CABG x 2  NSTEMI (non-ST elevated myocardial infarction) [facility])  Patient's Home Medications on Admission:  Current Outpatient Medications:    aspirin  EC 81 MG tablet, Take 81 mg by mouth daily. Swallow whole., Disp: , Rfl:    clopidogrel  (PLAVIX ) 75 MG tablet, Take 1 tablet (75 mg total) by mouth daily., Disp: 90 tablet, Rfl: 3   Dulaglutide  (TRULICITY ) 3 MG/0.5ML SOAJ, Inject 3 mg into the skin once a week., Disp: 6 mL, Rfl: 1   empagliflozin  (JARDIANCE ) 25 MG TABS tablet, Take 1 tablet (25 mg total) by mouth daily before breakfast., Disp: 90 tablet, Rfl: 1   Evolocumab  (REPATHA  SURECLICK) 140 MG/ML SOAJ, Inject 140 mg into the skin every 14 (fourteen) days., Disp: 2 mL, Rfl: 11   Insulin  Pen Needle (PEN NEEDLES) 32G X 4 MM MISC, Use with Victoza  to inject once daily, Disp: 90 each, Rfl: 3   Lancets (ONETOUCH DELICA PLUS LANCET33G) MISC, USE AS DIRECTED TO TEST BLOOD GLUCOSE, Disp: 100 each, Rfl: 1   metFORMIN  (GLUCOPHAGE ) 1000 MG tablet, Take 1 tablet (1,000 mg total) by mouth 2 (two) times daily with a meal., Disp: 180 tablet, Rfl: 1   Multiple Vitamins-Minerals (MULTIVITAMIN WITH MINERALS) tablet, Take 1 tablet by mouth daily., Disp: , Rfl:    nitroGLYCERIN  (NITROSTAT ) 0.4 MG SL tablet, Place 1 tablet (0.4 mg total) under the tongue every 5 (five) minutes as needed for chest pain. If you need more than 3 doses in 24 hours, call 911 or go to nearest ER, Disp:  25 tablet, Rfl: 3   predniSONE  (DELTASONE ) 20 MG tablet, Take 1 tablet (20 mg total) by mouth daily in case of gout flare-must follow-up with primary care physician if does not resolve with this (Patient taking differently: Take 1 tablet (20 mg total) by mouth daily in case of gout flare-must follow-up with primary care physician if does not resolve with this), Disp: 3 tablet, Rfl: 0   Study - SOS-AMI - selatogrel 16 mg/0.5 mL or placebo SQ injection (PI-Christopher), Inject 0.5 mLs (16 mg total) into the skin as needed (As needed for for symptoms of a heart attack (chest pain).)., Disp: 1 mL, Rfl: 0  Past Medical History: Past Medical History:  Diagnosis Date   Diabetes mellitus (HCC)    Diabetes mellitus without complication (HCC)    Phreesia 11/10/2019   Hyperlipidemia    STEMI (ST elevation myocardial infarction) (HCC)     Tobacco Use: Social History   Tobacco Use  Smoking Status Never  Smokeless Tobacco Never    Labs: Review Flowsheet  More data exists      Latest Ref Rng & Units 11/07/2023 11/08/2023 11/09/2023 12/20/2023 01/01/2024  Labs for ITP Cardiac and Pulmonary Rehab  Cholestrol 100 - 199 mg/dL - - - 852  -  LDL (calc) 0 - 99 mg/dL - - - 79  -  HDL-C >60 mg/dL - - - 55  -  Trlycerides 0 - 149 mg/dL - - - 67  -  Hemoglobin A1c 4.8 - 5.6 % - - - - 7.0   PH, Arterial 7.35 - 7.45 7.42  7.280  7.420  7.430  7.446  7.399  7.367  7.294  - -  PCO2 arterial 32 - 48 mmHg 42  42.9  32.2  32.5  32.7  36.5  38.7  43.7  - -  Bicarbonate 20.0 - 28.0 mmol/L 27.2  20.0  21.1  21.6  22.5  24.4  22.6  22.2  21.0  - -  TCO2 22 - 32 mmol/L - 21  22  23  25  23  24  26  24  23  23  27  22   - -  Acid-base deficit 0.0 - 2.0 mmol/L - 6.0  3.0  2.0  1.0  2.0  3.0  5.0  - -  O2 Saturation % 98.8  99  100  100  100  78  100  100  99  - -    Details       Multiple values from one day are sorted in reverse-chronological order         Capillary Blood Glucose: Lab Results  Component Value  Date   GLUCAP 145 (H) 02/02/2024   GLUCAP 153 (H) 02/02/2024   GLUCAP 158 (H) 01/31/2024   GLUCAP 164 (H) 01/02/2024   GLUCAP 149 (H) 01/02/2024     Exercise Target Goals: Exercise Program Goal: Individual exercise prescription set using results from initial 6 min walk test and THRR while considering  patient's activity barriers and safety.   Exercise Prescription Goal: Initial exercise prescription builds to 30-45 minutes a day of aerobic activity, 2-3 days per week.  Home exercise guidelines will be given to patient during program as part of exercise prescription that the participant will acknowledge.  Activity Barriers & Risk Stratification:  Activity Barriers & Cardiac Risk Stratification - 01/27/24 1407       Activity Barriers & Cardiac Risk Stratification   Activity Barriers Deconditioning    Cardiac Risk Stratification High   Under 5 MET on walk test         6 Minute Walk:  6 Minute Walk     Row Name 01/27/24 1405         6 Minute Walk   Phase Initial     Distance 1370 feet     Walk Time 6 minutes     # of Rest Breaks 0     MPH 2.5     METS 2.78     RPE 8     Perceived Dyspnea  0     VO2 Peak 9.73     Symptoms Yes (comment)     Comments Pre-orthostatic sitting 110/70 standing 100/62     Resting HR 60 bpm     Resting BP 110/70     Resting Oxygen Saturation  100 %     Exercise Oxygen Saturation  during 6 min walk 100 %     Max Ex. HR 83 bpm     Max Ex. BP 124/68     2 Minute Post BP 114/62        Oxygen Initial Assessment:   Oxygen Re-Evaluation:   Oxygen Discharge (Final Oxygen Re-Evaluation):   Initial Exercise Prescription:  Initial Exercise Prescription - 01/27/24 1400       Date of Initial Exercise RX and Referring Provider   Date 01/27/24    Referring Provider Dr. Ozell Fell NP    Expected Discharge  Date 03/08/24      Recumbant Bike   Level 2    RPM 50    Watts 15    Minutes 15    METs 2.5      NuStep   Level 2    SPM  75    Minutes 15    METs 1.9      Prescription Details   Frequency (times per week) 3    Duration Progress to 30 minutes of continuous aerobic without signs/symptoms of physical distress      Intensity   THRR 40-80% of Max Heartrate 65-131    Ratings of Perceived Exertion 11-13    Perceived Dyspnea 0-4      Progression   Progression Continue progressive overload as per policy without signs/symptoms or physical distress.      Resistance Training   Training Prescription Yes    Weight 3    Reps 10-15          Perform Capillary Blood Glucose checks as needed.  Exercise Prescription Changes:   Exercise Prescription Changes     Row Name 01/31/24 [phone] 02/18/24 0849 03/06/24 1000         Response to Exercise   Blood Pressure (Admit) 110/62 118/64 114/64     Blood Pressure (Exercise) 118/54 -- --     Blood Pressure (Exit) 108/60 110/60 94/60     Heart Rate (Admit) 76 bpm 80 bpm 66 bpm     Heart Rate (Exercise) 100 bpm 123 bpm 130 bpm     Heart Rate (Exit) 85 bpm 83 bpm 75 bpm     Rating of Perceived Exertion (Exercise) 8 10 11      Perceived Dyspnea (Exercise) 0 0 0     Symptoms none none none     Comments Pt first day in the [employer] program Reviewed MET's, goals and home ExRx Reviewed METs     Duration Progress to 30 minutes of  aerobic without signs/symptoms of physical distress Progress to 30 minutes of  aerobic without signs/symptoms of physical distress Progress to 30 minutes of  aerobic without signs/symptoms of physical distress     Intensity THRR unchanged THRR unchanged THRR unchanged       Progression   Progression Continue to progress workloads to maintain intensity without signs/symptoms of physical distress. Continue to progress workloads to maintain intensity without signs/symptoms of physical distress. Continue to progress workloads to maintain intensity without signs/symptoms of physical distress.     Average METs 2.2 3.6 5       Resistance Training    Training Prescription Yes Yes Yes     Weight 3 4 5      Reps 10-15 10-15 10-15     Time 10 Minutes 10 Minutes 5 Minutes       Recumbant Bike   Level 2 3 4      RPM 68 87 82     Watts 19 49 73     Minutes 15 15 15      METs 2.4 3.6 4.9       NuStep   Level 2 2 3      SPM 67 109 129     Minutes 15 15 15      METs 2 3 5.1       Home Exercise Plan   Plans to continue exercise at -- [employer] (comment) --     Frequency -- Add 2 additional days to program exercise sessions. --     Initial Home  Exercises Provided -- 02/18/24 --        Exercise Comments:   Exercise Comments     Row Name 01/31/24 0825 02/18/24 0852 03/06/24 1004 03/17/24 0851 03/20/24 0851   Exercise Comments Pt first day in the [facility] program. Pt tolerated exercise well with an average MET level of 2.2. Pt is off to a good start and is learning his THRR, RPE and ExRx Reviewed MET's, goals and home ExRx. Pt tolerated exercise well with an average MET level of 3.6. Pt is doing well and progressing WL's and MET's. INC WL on nustep today and set some new goals he's doing well overall and is increasing strength and endurance. He plans to add in exercise on his own for 1-2 days for 30 mins at the [facility], walking or treadmill. Pt continuing to make great progress in program, RB increased to lvl 4. Reviewed MET's and goals. Pt tolerated exercise well with an average MET level of 5.35. Pt is doing well and plans to change equipment next session to change the routine. He feels good with his goals and says he's learning a lot. He is noticing an increase in strength and stamina --      Exercise Goals and Review:   Exercise Goals     Row Name 01/27/24 1410             Exercise Goals   Increase Physical Activity Yes       Intervention Provide advice, education, support and counseling about physical activity/exercise needs.;Develop an individualized exercise prescription for aerobic and resistive training based  on initial evaluation findings, risk stratification, comorbidities and participant's personal goals.       Expected Outcomes Short Term: Attend rehab on a regular basis to increase amount of physical activity.;Long Term: Exercising regularly at least 3-5 days a week.;Long Term: Add in home exercise to make exercise part of routine and to increase amount of physical activity.       Increase Strength and Stamina Yes       Intervention Provide advice, education, support and counseling about physical activity/exercise needs.;Develop an individualized exercise prescription for aerobic and resistive training based on initial evaluation findings, risk stratification, comorbidities and participant's personal goals.       Expected Outcomes Short Term: Increase workloads from initial exercise prescription for resistance, speed, and METs.;Short Term: Perform resistance training exercises routinely during rehab and add in resistance training at home;Long Term: Improve cardiorespiratory fitness, muscular endurance and strength as measured by increased METs and functional capacity ( )       Able to understand and use rate of perceived exertion (RPE) scale Yes       Intervention Provide education and explanation on how to use RPE scale       Expected Outcomes Short Term: Able to use RPE daily in rehab to express subjective intensity level;Long Term:  Able to use RPE to guide intensity level when exercising independently       Knowledge and understanding of Target Heart Rate Range (THRR) Yes       Intervention Provide education and explanation of THRR including how the numbers were predicted and where they are located for reference       Expected Outcomes Short Term: Able to state/look up THRR;Long Term: Able to use THRR to govern intensity when exercising independently;Short Term: Able to use daily as guideline for intensity in rehab       Understanding of Exercise Prescription Yes  Intervention Provide  education, explanation, and written materials on patient's individual exercise prescription       Expected Outcomes Short Term: Able to explain program exercise prescription;Long Term: Able to explain home exercise prescription to exercise independently          Exercise Goals Re-Evaluation :  Exercise Goals Re-Evaluation     Row Name 01/31/24 0825 02/18/24 0850 03/17/24 0844         Exercise Goal Re-Evaluation   Exercise Goals Review Increase Physical Activity;Understanding of Exercise Prescription;Increase Strength and Stamina;Knowledge and understanding of Target Heart Rate Range (THRR);Able to understand and use rate of perceived exertion (RPE) scale Increase Physical Activity;Understanding of Exercise Prescription;Increase Strength and Stamina;Knowledge and understanding of Target Heart Rate Range (THRR);Able to understand and use rate of perceived exertion (RPE) scale Increase Physical Activity;Understanding of Exercise Prescription;Increase Strength and Stamina;Knowledge and understanding of Target Heart Rate Range (THRR);Able to understand and use rate of perceived exertion (RPE) scale     Comments Pt first day in the [facility] program. Pt tolerated exercise well with an average MET level of 2.2. Pt is off to a good start and is learning his THRR, RPE and ExRx Reviewed MET's, goals and home ExRx. Pt tolerated exercise well with an average MET level of 3.6. Pt is doing well and progressing WL's and MET's. INC WL on nustep today and set some new goals he's doing well overall and is increasing strength and endurance. He plans to add in exercise on his own for 1-2 days for 30 mins at the [facility], walking or treadmill. Reviewed MET's and goals. Pt tolerated exercise well with an average MET level of 5.35. Pt is doing well and plans to change equipment next session to change the routine. He feels good with his goals and says he's learning a lot. He is noticing an increase in strength and  stamina     Expected Outcomes Will continue to monitor pt and progress workloads as tolerated without sign or symptom Will continue to monitor pt and progress workloads as tolerated without sign or symptom Will continue to monitor pt and progress workloads as tolerated without sign or symptom        Discharge Exercise Prescription (Final Exercise Prescription Changes):  Exercise Prescription Changes - 03/06/24 1000       Response to Exercise   Blood Pressure (Admit) 114/64    Blood Pressure (Exit) 94/60    Heart Rate (Admit) 66 bpm    Heart Rate (Exercise) 130 bpm    Heart Rate (Exit) 75 bpm    Rating of Perceived Exertion (Exercise) 11    Perceived Dyspnea (Exercise) 0    Symptoms none    Comments Reviewed METs    Duration Progress to 30 minutes of  aerobic without signs/symptoms of physical distress    Intensity THRR unchanged      Progression   Progression Continue to progress workloads to maintain intensity without signs/symptoms of physical distress.    Average METs 5      Resistance Training   Training Prescription Yes    Weight 5    Reps 10-15    Time 5 Minutes      Recumbant Bike   Level 4    RPM 82    Watts 73    Minutes 15    METs 4.9      NuStep   Level 3    SPM 129    Minutes 15    METs 5.1  Nutrition:  Target Goals: Understanding of nutrition guidelines, daily intake of sodium 1500mg , cholesterol 200mg , calories 30% from fat and 7% or less from saturated fats, daily to have 5 or more servings of fruits and vegetables.  Biometrics:  Pre Biometrics - 01/27/24 1400       Pre Biometrics   Waist Circumference 35 inches    Hip Circumference 38 inches    Waist to Hip Ratio 0.92 %    Triceps Skinfold 16 mm    % Body Fat 23.8 %    Grip Strength 34 kg    Flexibility 14.25 in    Single Leg Stand 30 seconds           Nutrition Therapy Plan and Nutrition Goals:   Nutrition Assessments:  Nutrition Assessments - 02/18/24 1215        Rate Your Plate Scores   Pre Score 74         MEDIFICTS Score Key: >=70 Need to make dietary changes  40-70 Heart Healthy Diet <= 40 Therapeutic Level Cholesterol Diet   Flowsheet Row CARDIAC REHAB PHASE II EXERCISE from 02/16/2024 in [facility] for Heart, Vascular, & Lung Health  Picture Your Plate Total Score on Admission 74   Picture Your Plate Scores: <59 Unhealthy dietary pattern with much room for improvement. 41-50 Dietary pattern unlikely to meet recommendations for good health and room for improvement. 51-60 More healthful dietary pattern, with some room for improvement.  >60 Healthy dietary pattern, although there may be some specific behaviors that could be improved.    Nutrition Goals Re-Evaluation:   Nutrition Goals Re-Evaluation:   Nutrition Goals Discharge (Final Nutrition Goals Re-Evaluation):   Psychosocial: Target Goals: Acknowledge presence or absence of significant depression and/or stress, maximize coping skills, provide positive support system. Participant is able to verbalize types and ability to use techniques and skills needed for reducing stress and depression.  Initial Review & Psychosocial Screening:  Initial Psych Review & Screening - 01/27/24 1411       Initial Review   Current issues with None Identified      Family Dynamics   Good Support System? Yes   His wife and family are good support. He has no needs at this time     Barriers   Psychosocial barriers to participate in program The patient should benefit from training in stress management and relaxation.      Screening Interventions   Interventions Encouraged to exercise;Provide feedback about the scores to participant    Expected Outcomes Short Term goal: Identification and review with participant of any Quality of Life or Depression concerns found by scoring the questionnaire.;Long Term goal: The participant improves quality of Life and PHQ9 Scores as seen by  post scores and/or verbalization of changes;Long Term Goal: Stressors or current issues are controlled or eliminated.          Quality of Life Scores:  Quality of Life - 02/16/24 0854       Quality of Life   Select Quality of Life   Pre     Quality of Life Scores   Health/Function Pre 23.08 %    Socioeconomic Pre 25 %    Psych/Spiritual Pre 24 %    Family Pre 30 %    GLOBAL Pre 24.77 %         Scores of 19 and below usually indicate a poorer quality of life in these areas.  A difference of  2-3 points is a clinically  meaningful difference.  A difference of 2-3 points in the total score of the Quality of Life Index has been associated with significant improvement in overall quality of life, self-image, physical symptoms, and general health in studies assessing change in quality of life.  PHQ-9: Review Flowsheet  More data exists      01/27/2024 01/06/2024 10/01/2023 06/18/2023 02/19/2023  Depression screen PHQ 2/9  Decreased Interest 0 0 0 0 0  Down, Depressed, Hopeless 0 0 0 0 0  PHQ - 2 Score 0 0 0 0 0  Altered sleeping 0 0 0 0 0  Tired, decreased energy 0 2 1 0 1  Change in appetite 0 0 1 0 0  Feeling bad or failure about yourself  0 0 0 0 0  Trouble concentrating 0 0 0 0 0  Moving slowly or fidgety/restless 0 0 0 0 0  Suicidal thoughts 0 0 0 0 0  PHQ-9 Score 0 2 2 0 1  Difficult doing work/chores - Not difficult at all Not difficult at all - -   Interpretation of Total Score  Total Score Depression Severity:  1-4 = Minimal depression, 5-9 = Mild depression, 10-14 = Moderate depression, 15-19 = Moderately severe depression, 20-27 = Severe depression   Psychosocial Evaluation and Intervention:   Psychosocial Re-Evaluation:  Psychosocial Re-Evaluation     Row Name 01/31/24 [phone] 02/15/24 0855 03/15/24 9185         Psychosocial Re-Evaluation   Current issues with None Identified None Identified None Identified     Comments Jovanne voiced no pyschosocial concerns  today on his first day of exercise at cardiac rehab Niranjan has not voiced any increased concerns or stressors during exercise at cardiac rehab Deklen has not voiced any increased concerns or stressors during exercise at cardiac rehab     Expected Outcomes Malakie will have controlled or decreased stressors upon completion of cardiac rehab -- --     Interventions Stress management education;Relaxation education;Encouraged to attend Cardiac Rehabilitation for the exercise Encouraged to attend Cardiac Rehabilitation for the exercise;Relaxation education Encouraged to attend Cardiac Rehabilitation for the exercise;Relaxation education     Continue Psychosocial Services  No Follow up required No Follow up required No Follow up required        Psychosocial Discharge (Final Psychosocial Re-Evaluation):  Psychosocial Re-Evaluation - 03/15/24 0814       Psychosocial Re-Evaluation   Current issues with None Identified    Comments Yakov has not voiced any increased concerns or stressors during exercise at cardiac rehab    Interventions Encouraged to attend Cardiac Rehabilitation for the exercise;Relaxation education    Continue Psychosocial Services  No Follow up required          Vocational Rehabilitation: Provide vocational rehab assistance to qualifying candidates.   Vocational Rehab Evaluation & Intervention:  Vocational Rehab - 01/27/24 1415       Initial Vocational Rehab Evaluation & Intervention   Assessment shows need for Vocational Rehabilitation No   He is on short term disablility through his work.         Education: Education Goals: Education classes will be provided on a weekly basis, covering required topics. Participant will state understanding/return demonstration of topics presented.    Education     Row Name 01/31/24 0800     Education   Cardiac Education Topics [facility]   Psychologist, Forensic Exercise  Education   Exercise  Education Biomechanial Limitations   Instruction Review Code 1- Verbalizes Understanding   Class Start Time [phone]   Class Stop Time 0846   Class Time Calculation (min) 35 min    Row Name 02/02/24 0900     Education   Cardiac Education Topics [facility]   Secondary School Teacher School   Educator Nurse   Weekly Topic Fast Evening Meals   Instruction Review Code 1- Verbalizes Understanding   Class Start Time 0815   Class Stop Time 0847   Class Time Calculation (min) 32 min    Row Name 02/04/24 0900     Education   Cardiac Education Topics [facility]   Select Core Videos     Core Videos   Educator Dietitian   Select Nutrition   Nutrition Vitamins and Minerals   Instruction Review Code 1- Verbalizes Understanding   Class Start Time 0815   Class Stop Time 0855   Class Time Calculation (min) 40 min    Row Name 02/07/24 0800     Education   Cardiac Education Topics [facility]   Select Core Videos     Core Videos   Educator Exercise Physiologist   Select Exercise Education   Exercise Education Improving Performance   Instruction Review Code 1- Verbalizes Understanding   Class Start Time [phone]   Class Stop Time 0849   Class Time Calculation (min) 37 min    Row Name 02/09/24 0900     Education   Cardiac Education Topics [facility]   Secondary School Teacher School   Educator Nurse;Respiratory Therapist   Weekly Topic International Cuisine- Spotlight on the [facility] Zones   Instruction Review Code 1- Verbalizes Understanding   Class Start Time 0815   Class Stop Time 0849   Class Time Calculation (min) 34 min    Row Name 02/11/24 0700     Education   Cardiac Education Topics [facility]   Glass Blower/designer Nutrition   Nutrition Workshop Fueling a Forensic Psychologist   Instruction Review Code 1- Verbalizes Understanding   Class Start Time 0815   Class Stop Time 0847   Class Time Calculation  (min) 32 min    Row Name 02/14/24 0900     Education   Cardiac Education Topics [facility]   Select Workshops     Workshops   Educator Exercise Physiologist   Select Psychosocial   Psychosocial Workshop Healthy Sleep for a Healthy Heart   Instruction Review Code 1- Verbalizes Understanding   Class Start Time 0815   Class Stop Time 0906   Class Time Calculation (min) 51 min    Row Name 02/18/24 0700     Education   Cardiac Education Topics [facility]   Select Core Videos     Core Videos   Educator Exercise Physiologist   Select Psychosocial   Psychosocial How Our Thoughts Can Heal Our Hearts   Instruction Review Code 1- Verbalizes Understanding   Class Start Time 0815   Class Stop Time 0900   Class Time Calculation (min) 45 min    Row Name 02/21/24 0900     Education   Cardiac Education Topics [facility]   Hospital Doctor Education   General Education Heart Disease Risk Reduction   Instruction Review Code 1- Verbalizes Understanding   Class Start Time [phone]  Class Stop Time 0853   Class Time Calculation (min) 35 min    Row Name 02/23/24 0900     Education   Cardiac Education Topics Pritikin   Secondary School Teacher School   Educator Nurse   Weekly Topic Powerhouse Plant-Based Proteins   Instruction Review Code 1- Verbalizes Understanding   Class Start Time 0815   Class Stop Time 0848   Class Time Calculation (min) 33 min    Row Name 02/25/24 0900     Education   Cardiac Education Topics Pritikin   Select Core Videos     Core Videos   Educator Dietitian   Select Nutrition   Nutrition Facts on Fat   Instruction Review Code 1- Verbalizes Understanding   Class Start Time [phone]   Class Stop Time 0853   Class Time Calculation (min) 37 min    Row Name 02/28/24 0800     Education   Cardiac Education Topics Pritikin   Arts Administrator Psychosocial   Psychosocial Workshop From Head to Heart: The Power of a Healthy Outlook   Instruction Review Code 1- Verbalizes Understanding   Class Start Time [phone]   Class Stop Time 0857   Class Time Calculation (min) 43 min    Row Name 03/01/24 0800     Education   Cardiac Education Topics --   Select --     Hospital Doctor --   Weekly Topic --   Instruction Review Code --    Row Name 03/03/24 0800     Education   Cardiac Education Topics Pritikin   Select Workshops     Workshops   Educator Exercise Physiologist   Select Exercise   Exercise Workshop Managing Heart Disease: Your Path to a Healthier Heart   Instruction Review Code 1- Verbalizes Understanding   Class Start Time [phone]   Class Stop Time 0900   Class Time Calculation (min) 41 min    Row Name 03/06/24 0700     Education   Cardiac Education Topics Pritikin   Select Core Videos     Core Videos   Educator Exercise Physiologist   Select Psychosocial   Psychosocial Healthy Minds, Bodies, Hearts   Instruction Review Code 1- Verbalizes Understanding   Class Start Time 0815   Class Stop Time 0847   Class Time Calculation (min) 32 min    Row Name 03/08/24 0700     Education   Cardiac Education Topics Pritikin   Secondary School Teacher School   Educator Nurse   Weekly Topic Adding Flavor - Sodium-Free   Instruction Review Code 1- Verbalizes Understanding   Class Start Time 0815   Class Stop Time 0847   Class Time Calculation (min) 32 min    Row Name 03/10/24 0800     Education   Cardiac Education Topics Pritikin   [employer]     Workshops   Educator Dietitian   Select Nutrition   Nutrition Workshop Label Reading   Instruction Review Code 1- Verbalizes Understanding   Class Start Time 0815   Class Stop Time [phone]   Class Time Calculation (min) 37 min    Row Name 03/15/24 1000     Education   Cardiac Education Topics Pritikin   Engineer, Agricultural   Weekly Topic  Fast and Healthy Breakfasts   Instruction Review Code 1- Verbalizes Understanding   Class Start Time 0815   Class Stop Time 0852   Class Time Calculation (min) 37 min    Row Name 03/20/24 0900     Education   Cardiac Education Topics Pritikin   Select Core Videos     Core Videos   Educator Exercise Physiologist   Select General Education   General Education Metabolic Syndrome and Belly Fat   Instruction Review Code 1- Verbalizes Understanding   Class Start Time [phone]   Class Stop Time 0849   Class Time Calculation (min) 38 min      Core Videos: Exercise    Move It!  Clinical staff conducted group or individual video education with verbal and written material and guidebook.  Patient learns the recommended Pritikin exercise program. Exercise with the goal of living a long, healthy life. Some of the health benefits of exercise include controlled diabetes, healthier blood pressure levels, improved cholesterol levels, improved heart and lung capacity, improved sleep, and better body composition. Everyone should speak with their doctor before starting or changing an exercise routine.  Biomechanical Limitations Clinical staff conducted group or individual video education with verbal and written material and guidebook.  Patient learns how biomechanical limitations can impact exercise and how we can mitigate and possibly overcome limitations to have an impactful and balanced exercise routine.  Body Composition Clinical staff conducted group or individual video education with verbal and written material and guidebook.  Patient learns that body composition (ratio of muscle mass to fat mass) is a key component to assessing overall fitness, rather than body weight alone. Increased fat mass, especially visceral belly fat, can put us  at increased risk for metabolic syndrome, type 2 diabetes, heart disease,  and even death. It is recommended to combine diet and exercise (cardiovascular and resistance training) to improve your body composition. Seek guidance from your physician and exercise physiologist before implementing an exercise routine.  Exercise Action Plan Clinical staff conducted group or individual video education with verbal and written material and guidebook.  Patient learns the recommended strategies to achieve and enjoy long-term exercise adherence, including variety, self-motivation, self-efficacy, and positive decision making. Benefits of exercise include fitness, good health, weight management, more energy, better sleep, less stress, and overall well-being.  Medical   Heart Disease Risk Reduction Clinical staff conducted group or individual video education with verbal and written material and guidebook.  Patient learns our heart is our most vital organ as it circulates oxygen, nutrients, white blood cells, and hormones throughout the entire body, and carries waste away. Data supports a plant-based eating plan like the Pritikin Program for its effectiveness in slowing progression of and reversing heart disease. The video provides a number of recommendations to address heart disease.   Metabolic Syndrome and Belly Fat  Clinical staff conducted group or individual video education with verbal and written material and guidebook.  Patient learns what metabolic syndrome is, how it leads to heart disease, and how one can reverse it and keep it from coming back. You have metabolic syndrome if you have 3 of the following 5 criteria: abdominal obesity, high blood pressure, high triglycerides, low HDL cholesterol, and high blood sugar.  Hypertension and Heart Disease Clinical staff conducted group or individual video education with verbal and written material and guidebook.  Patient learns that high blood pressure, or hypertension, is very common in the [place] . Hypertension is largely due to  excessive salt intake,  but other important risk factors include being overweight, physical inactivity, drinking too much alcohol, smoking, and not eating enough potassium from fruits and vegetables. High blood pressure is a leading risk factor for heart attack, stroke, congestive heart failure, dementia, kidney failure, and premature death. Long-term effects of excessive salt intake include stiffening of the arteries and thickening of heart muscle and organ damage. Recommendations include ways to reduce hypertension and the risk of heart disease.  Diseases of Our Time - Focusing on Diabetes Clinical staff conducted group or individual video education with verbal and written material and guidebook.  Patient learns why the best way to stop diseases of our time is prevention, through food and other lifestyle changes. Medicine (such as prescription pills and surgeries) is often only a Band-Aid on the problem, not a long-term solution. Most common diseases of our time include obesity, type 2 diabetes, hypertension, heart disease, and cancer. The Pritikin Program is recommended and has been proven to help reduce, reverse, and/or prevent the damaging effects of metabolic syndrome.  Nutrition   Overview of the Pritikin Eating Plan  Clinical staff conducted group or individual video education with verbal and written material and guidebook.  Patient learns about the Pritikin Eating Plan for disease risk reduction. The Pritikin Eating Plan emphasizes a wide variety of unrefined, minimally-processed carbohydrates, like fruits, vegetables, whole grains, and legumes. Go, Caution, and Stop food choices are explained. Plant-based and lean animal proteins are emphasized. Rationale provided for low sodium intake for blood pressure control, low added sugars for blood sugar stabilization, and low added fats and oils for coronary artery disease risk reduction and weight management.  Calorie Density  Clinical staff conducted  group or individual video education with verbal and written material and guidebook.  Patient learns about calorie density and how it impacts the Pritikin Eating Plan. Knowing the characteristics of the food you choose will help you decide whether those foods will lead to weight gain or weight loss, and whether you want to consume more or less of them. Weight loss is usually a side effect of the Pritikin Eating Plan because of its focus on low calorie-dense foods.  Label Reading  Clinical staff conducted group or individual video education with verbal and written material and guidebook.  Patient learns about the Pritikin recommended label reading guidelines and corresponding recommendations regarding calorie density, added sugars, sodium content, and whole grains.  Dining Out - Part 1  Clinical staff conducted group or individual video education with verbal and written material and guidebook.  Patient learns that restaurant meals can be sabotaging because they can be so high in calories, fat, sodium, and/or sugar. Patient learns recommended strategies on how to positively address this and avoid unhealthy pitfalls.  Facts on Fats  Clinical staff conducted group or individual video education with verbal and written material and guidebook.  Patient learns that lifestyle modifications can be just as effective, if not more so, as many medications for lowering your risk of heart disease. A Pritikin lifestyle can help to reduce your risk of inflammation and atherosclerosis (cholesterol build-up, or plaque, in the artery walls). Lifestyle interventions such as dietary choices and physical activity address the cause of atherosclerosis. A review of the types of fats and their impact on blood cholesterol levels, along with dietary recommendations to reduce fat intake is also included.  Nutrition Action Plan  Clinical staff conducted group or individual video education with verbal and written material and  guidebook.  Patient learns how to incorporate  Pritikin recommendations into their lifestyle. Recommendations include planning and keeping personal health goals in mind as an important part of their success.  Healthy Mind-Set    Healthy Minds, Bodies, Hearts  Clinical staff conducted group or individual video education with verbal and written material and guidebook.  Patient learns how to identify when they are stressed. Video will discuss the impact of that stress, as well as the many benefits of stress management. Patient will also be introduced to stress management techniques. The way we think, act, and feel has an impact on our hearts.  How Our Thoughts Can Heal Our Hearts  Clinical staff conducted group or individual video education with verbal and written material and guidebook.  Patient learns that negative thoughts can cause depression and anxiety. This can result in negative lifestyle behavior and serious health problems. Cognitive behavioral therapy is an effective method to help control our thoughts in order to change and improve our emotional outlook.  Additional Videos:  Exercise    Improving Performance  Clinical staff conducted group or individual video education with verbal and written material and guidebook.  Patient learns to use a non-linear approach by alternating intensity levels and lengths of time spent exercising to help burn more calories and lose more body fat. Cardiovascular exercise helps improve heart health, metabolism, hormonal balance, blood sugar control, and recovery from fatigue. Resistance training improves strength, endurance, balance, coordination, reaction time, metabolism, and muscle mass. Flexibility exercise improves circulation, posture, and balance. Seek guidance from your physician and exercise physiologist before implementing an exercise routine and learn your capabilities and proper form for all exercise.  Introduction to Yoga  Clinical staff  conducted group or individual video education with verbal and written material and guidebook.  Patient learns about yoga, a discipline of the coming together of mind, breath, and body. The benefits of yoga include improved flexibility, improved range of motion, better posture and core strength, increased lung function, weight loss, and positive self-image. Yoga's heart health benefits include lowered blood pressure, healthier heart rate, decreased cholesterol and triglyceride levels, improved immune function, and reduced stress. Seek guidance from your physician and exercise physiologist before implementing an exercise routine and learn your capabilities and proper form for all exercise.  Medical   Aging: Enhancing Your Quality of Life  Clinical staff conducted group or individual video education with verbal and written material and guidebook.  Patient learns key strategies and recommendations to stay in good physical health and enhance quality of life, such as prevention strategies, having an advocate, securing a Health Care Proxy and Power of Attorney, and keeping a list of medications and system for tracking them. It also discusses how to avoid risk for bone loss.  Biology of Weight Control  Clinical staff conducted group or individual video education with verbal and written material and guidebook.  Patient learns that weight gain occurs because we consume more calories than we burn (eating more, moving less). Even if your body weight is normal, you may have higher ratios of fat compared to muscle mass. Too much body fat puts you at increased risk for cardiovascular disease, heart attack, stroke, type 2 diabetes, and obesity-related cancers. In addition to exercise, following the Pritikin Eating Plan can help reduce your risk.  Decoding Lab Results  Clinical staff conducted group or individual video education with verbal and written material and guidebook.  Patient learns that lab test reflects one  measurement whose values change over time and are influenced by many factors, including medication,  stress, sleep, exercise, food, hydration, pre-existing medical conditions, and more. It is recommended to use the knowledge from this video to become more involved with your lab results and evaluate your numbers to speak with your doctor.   Diseases of Our Time - Overview  Clinical staff conducted group or individual video education with verbal and written material and guidebook.  Patient learns that according to the CDC, 50% to 70% of chronic diseases (such as obesity, type 2 diabetes, elevated lipids, hypertension, and heart disease) are avoidable through lifestyle improvements including healthier food choices, listening to satiety cues, and increased physical activity.  Sleep Disorders Clinical staff conducted group or individual video education with verbal and written material and guidebook.  Patient learns how good quality and duration of sleep are important to overall health and well-being. Patient also learns about sleep disorders and how they impact health along with recommendations to address them, including discussing with a physician.  Nutrition  Dining Out - Part 2 Clinical staff conducted group or individual video education with verbal and written material and guidebook.  Patient learns how to plan ahead and communicate in order to maximize their dining experience in a healthy and nutritious manner. Included are recommended food choices based on the type of restaurant the patient is visiting.   Fueling a Banker conducted group or individual video education with verbal and written material and guidebook.  There is a strong connection between our food choices and our health. Diseases like obesity and type 2 diabetes are very prevalent and are in large-part due to lifestyle choices. The Pritikin Eating Plan provides plenty of food and hunger-curbing satisfaction. It is  easy to follow, affordable, and helps reduce health risks.  Menu Workshop  Clinical staff conducted group or individual video education with verbal and written material and guidebook.  Patient learns that restaurant meals can sabotage health goals because they are often packed with calories, fat, sodium, and sugar. Recommendations include strategies to plan ahead and to communicate with the manager, chef, or server to help order a healthier meal.  Planning Your Eating Strategy  Clinical staff conducted group or individual video education with verbal and written material and guidebook.  Patient learns about the Pritikin Eating Plan and its benefit of reducing the risk of disease. The Pritikin Eating Plan does not focus on calories. Instead, it emphasizes high-quality, nutrient-rich foods. By knowing the characteristics of the foods, we choose, we can determine their calorie density and make informed decisions.  Targeting Your Nutrition Priorities  Clinical staff conducted group or individual video education with verbal and written material and guidebook.  Patient learns that lifestyle habits have a tremendous impact on disease risk and progression. This video provides eating and physical activity recommendations based on your personal health goals, such as reducing LDL cholesterol, losing weight, preventing or controlling type 2 diabetes, and reducing high blood pressure.  Vitamins and Minerals  Clinical staff conducted group or individual video education with verbal and written material and guidebook.  Patient learns different ways to obtain key vitamins and minerals, including through a recommended healthy diet. It is important to discuss all supplements you take with your doctor.   Healthy Mind-Set    Smoking Cessation  Clinical staff conducted group or individual video education with verbal and written material and guidebook.  Patient learns that cigarette smoking and tobacco addiction pose  a serious health risk which affects millions of people. Stopping smoking will significantly reduce the risk of  heart disease, lung disease, and many forms of cancer. Recommended strategies for quitting are covered, including working with your doctor to develop a successful plan.  Culinary   Becoming a Set Designer conducted group or individual video education with verbal and written material and guidebook.  Patient learns that cooking at home can be healthy, cost-effective, quick, and puts them in control. Keys to cooking healthy recipes will include looking at your recipe, assessing your equipment needs, planning ahead, making it simple, choosing cost-effective seasonal ingredients, and limiting the use of added fats, salts, and sugars.  Cooking - Breakfast and Snacks  Clinical staff conducted group or individual video education with verbal and written material and guidebook.  Patient learns how important breakfast is to satiety and nutrition through the entire day. Recommendations include key foods to eat during breakfast to help stabilize blood sugar levels and to prevent overeating at meals later in the day. Planning ahead is also a key component.  Cooking - Educational Psychologist conducted group or individual video education with verbal and written material and guidebook.  Patient learns eating strategies to improve overall health, including an approach to cook more at home. Recommendations include thinking of animal protein as a side on your plate rather than center stage and focusing instead on lower calorie dense options like vegetables, fruits, whole grains, and plant-based proteins, such as beans. Making sauces in large quantities to freeze for later and leaving the skin on your vegetables are also recommended to maximize your experience.  Cooking - Healthy Salads and Dressing Clinical staff conducted group or individual video education with verbal and written  material and guidebook.  Patient learns that vegetables, fruits, whole grains, and legumes are the foundations of the Pritikin Eating Plan. Recommendations include how to incorporate each of these in flavorful and healthy salads, and how to create homemade salad dressings. Proper handling of ingredients is also covered. Cooking - Soups and [employer] - Soups and Desserts Clinical staff conducted group or individual video education with verbal and written material and guidebook.  Patient learns that Pritikin soups and desserts make for easy, nutritious, and delicious snacks and meal components that are low in sodium, fat, sugar, and calorie density, while high in vitamins, minerals, and filling fiber. Recommendations include simple and healthy ideas for soups and desserts.   Overview     The Pritikin Solution Program Overview Clinical staff conducted group or individual video education with verbal and written material and guidebook.  Patient learns that the results of the Pritikin Program have been documented in more than 100 articles published in peer-reviewed journals, and the benefits include reducing risk factors for (and, in some cases, even reversing) high cholesterol, high blood pressure, type 2 diabetes, obesity, and more! An overview of the three key pillars of the Pritikin Program will be covered: eating well, doing regular exercise, and having a healthy mind-set.  WORKSHOPS  Exercise: Exercise Basics: Building Your Action Plan Clinical staff led group instruction and group discussion with PowerPoint presentation and patient guidebook. To enhance the learning environment the use of posters, models and videos may be added. At the conclusion of this workshop, patients will comprehend the difference between physical activity and exercise, as well as the benefits of incorporating both, into their routine. Patients will understand the FITT (Frequency, Intensity, Time, and Type) principle  and how to use it to build an exercise action plan. In addition, safety concerns and other considerations for  exercise and cardiac rehab will be addressed by the presenter. The purpose of this lesson is to promote a comprehensive and effective weekly exercise routine in order to improve patients' overall level of fitness.   Managing Heart Disease: Your Path to a Healthier Heart Clinical staff led group instruction and group discussion with PowerPoint presentation and patient guidebook. To enhance the learning environment the use of posters, models and videos may be added.At the conclusion of this workshop, patients will understand the anatomy and physiology of the heart. Additionally, they will understand how Pritikin's three pillars impact the risk factors, the progression, and the management of heart disease.  The purpose of this lesson is to provide a high-level overview of the heart, heart disease, and how the Pritikin lifestyle positively impacts risk factors.  Exercise Biomechanics Clinical staff led group instruction and group discussion with PowerPoint presentation and patient guidebook. To enhance the learning environment the use of posters, models and videos may be added. Patients will learn how the structural parts of their bodies function and how these functions impact their daily activities, movement, and exercise. Patients will learn how to promote a neutral spine, learn how to manage pain, and identify ways to improve their physical movement in order to promote healthy living. The purpose of this lesson is to expose patients to common physical limitations that impact physical activity. Participants will learn practical ways to adapt and manage aches and pains, and to minimize their effect on regular exercise. Patients will learn how to maintain good posture while sitting, walking, and lifting.  Balance Training and Fall Prevention  Clinical staff led group instruction and group  discussion with PowerPoint presentation and patient guidebook. To enhance the learning environment the use of posters, models and videos may be added. At the conclusion of this workshop, patients will understand the importance of their sensorimotor skills (vision, proprioception, and the vestibular system) in maintaining their ability to balance as they age. Patients will apply a variety of balancing exercises that are appropriate for their current level of function. Patients will understand the common causes for poor balance, possible solutions to these problems, and ways to modify their physical environment in order to minimize their fall risk. The purpose of this lesson is to teach patients about the importance of maintaining balance as they age and ways to minimize their risk of falling.  WORKSHOPS   Nutrition:  Fueling a Ship Broker led group instruction and group discussion with PowerPoint presentation and patient guidebook. To enhance the learning environment the use of posters, models and videos may be added. Patients will review the foundational principles of the Pritikin Eating Plan and understand what constitutes a serving size in each of the food groups. Patients will also learn Pritikin-friendly foods that are better choices when away from home and review make-ahead meal and snack options. Calorie density will be reviewed and applied to three nutrition priorities: weight maintenance, weight loss, and weight gain. The purpose of this lesson is to reinforce (in a group setting) the key concepts around what patients are recommended to eat and how to apply these guidelines when away from home by planning and selecting Pritikin-friendly options. Patients will understand how calorie density may be adjusted for different weight management goals.  Mindful Eating  Clinical staff led group instruction and group discussion with PowerPoint presentation and patient guidebook. To enhance  the learning environment the use of posters, models and videos may be added. Patients will briefly review the concepts of  the Pritikin Eating Plan and the importance of low-calorie dense foods. The concept of mindful eating will be introduced as well as the importance of paying attention to internal hunger signals. Triggers for non-hunger eating and techniques for dealing with triggers will be explored. The purpose of this lesson is to provide patients with the opportunity to review the basic principles of the Pritikin Eating Plan, discuss the value of eating mindfully and how to measure internal cues of hunger and fullness using the Hunger Scale. Patients will also discuss reasons for non-hunger eating and learn strategies to use for controlling emotional eating.  Targeting Your Nutrition Priorities Clinical staff led group instruction and group discussion with PowerPoint presentation and patient guidebook. To enhance the learning environment the use of posters, models and videos may be added. Patients will learn how to determine their genetic susceptibility to disease by reviewing their family history. Patients will gain insight into the importance of diet as part of an overall healthy lifestyle in mitigating the impact of genetics and other environmental insults. The purpose of this lesson is to provide patients with the opportunity to assess their personal nutrition priorities by looking at their family history, their own health history and current risk factors. Patients will also be able to discuss ways of prioritizing and modifying the Pritikin Eating Plan for their highest risk areas  Menu  Clinical staff led group instruction and group discussion with PowerPoint presentation and patient guidebook. To enhance the learning environment the use of posters, models and videos may be added. Using menus brought in from [employer], or printed from [employer], patients will apply the Pritikin dining out  guidelines that were presented in the [employer] video. Patients will also be able to practice these guidelines in a variety of provided scenarios. The purpose of this lesson is to provide patients with the opportunity to practice hands-on learning of the Pritikin Dining Out guidelines with actual menus and practice scenarios.  Label Reading Clinical staff led group instruction and group discussion with PowerPoint presentation and patient guidebook. To enhance the learning environment the use of posters, models and videos may be added. Patients will review and discuss the Pritikin label reading guidelines presented in Pritikin's Label Reading Educational series video. Using fool labels brought in from local grocery stores and markets, patients will apply the label reading guidelines and determine if the packaged food meet the Pritikin guidelines. The purpose of this lesson is to provide patients with the opportunity to review, discuss, and practice hands-on learning of the Pritikin Label Reading guidelines with actual packaged food labels. Cooking School  Pritikin's [employer] are designed to teach patients ways to prepare quick, simple, and affordable recipes at home. The importance of nutrition's role in chronic disease risk reduction is reflected in its emphasis in the overall Pritikin program. By learning how to prepare essential core Pritikin Eating Plan recipes, patients will increase control over what they eat; be able to customize the flavor of foods without the use of added salt, sugar, or fat; and improve the quality of the food they consume. By learning a set of core recipes which are easily assembled, quickly prepared, and affordable, patients are more likely to prepare more healthy foods at home. These workshops focus on convenient breakfasts, simple entres, side dishes, and desserts which can be prepared with minimal effort and are consistent with nutrition  recommendations for cardiovascular risk reduction. Cooking [employer] are taught by a armed forces logistics/support/administrative officer (  RD) who has been trained by the [employer] team. The chef or RD has a clear understanding of the importance of minimizing - if not completely eliminating - added fat, sugar, and sodium in recipes. Throughout the series of Cooking School Workshop sessions, patients will learn about healthy ingredients and efficient methods of cooking to build confidence in their capability to prepare    Cooking School weekly topics:  Adding Flavor- Sodium-Free  Fast and Healthy Breakfasts  Powerhouse Plant-Based Proteins  Satisfying Salads and Dressings  Simple Sides and Sauces  International Cuisine-Spotlight on the [employer] Zones  Delicious Desserts  Savory Soups  [employer] - Meals in a Astronomer Appetizers and Snacks  Comforting Weekend Breakfasts  One-Pot Wonders   Fast Evening Meals  Landscape Architect Your Pritikin Plate  WORKSHOPS   Healthy Mindset (Psychosocial):  Focused Goals, Sustainable Changes Clinical staff led group instruction and group discussion with PowerPoint presentation and patient guidebook. To enhance the learning environment the use of posters, models and videos may be added. Patients will be able to apply effective goal setting strategies to establish at least one personal goal, and then take consistent, meaningful action toward that goal. They will learn to identify common barriers to achieving personal goals and develop strategies to overcome them. Patients will also gain an understanding of how our mind-set can impact our ability to achieve goals and the importance of cultivating a positive and growth-oriented mind-set. The purpose of this lesson is to provide patients with a deeper understanding of how to set and achieve personal goals, as well as the tools and strategies needed to overcome common obstacles which may arise along  the way.  From Head to Heart: The Power of a Healthy Outlook  Clinical staff led group instruction and group discussion with PowerPoint presentation and patient guidebook. To enhance the learning environment the use of posters, models and videos may be added. Patients will be able to recognize and describe the impact of emotions and mood on physical health. They will discover the importance of self-care and explore self-care practices which may work for them. Patients will also learn how to utilize the 4 C's to cultivate a healthier outlook and better manage stress and challenges. The purpose of this lesson is to demonstrate to patients how a healthy outlook is an essential part of maintaining good health, especially as they continue their cardiac rehab journey.  Healthy Sleep for a Healthy Heart Clinical staff led group instruction and group discussion with PowerPoint presentation and patient guidebook. To enhance the learning environment the use of posters, models and videos may be added. At the conclusion of this workshop, patients will be able to demonstrate knowledge of the importance of sleep to overall health, well-being, and quality of life. They will understand the symptoms of, and treatments for, common sleep disorders. Patients will also be able to identify daytime and nighttime behaviors which impact sleep, and they will be able to apply these tools to help manage sleep-related challenges. The purpose of this lesson is to provide patients with a general overview of sleep and outline the importance of quality sleep. Patients will learn about a few of the most common sleep disorders. Patients will also be introduced to the concept of "sleep hygiene," and discover ways to self-manage certain sleeping problems through simple daily behavior changes. Finally, the workshop will motivate patients by clarifying the links between quality sleep and their goals of heart-healthy living.   Recognizing and  Reducing Stress Clinical staff led group instruction and group discussion with PowerPoint presentation and patient guidebook. To enhance the learning environment the use of posters, models and videos may be added. At the conclusion of this workshop, patients will be able to understand the types of stress reactions, differentiate between acute and chronic stress, and recognize the impact that chronic stress has on their health. They will also be able to apply different coping mechanisms, such as reframing negative self-talk. Patients will have the opportunity to practice a variety of stress management techniques, such as deep abdominal breathing, progressive muscle relaxation, and/or guided imagery.  The purpose of this lesson is to educate patients on the role of stress in their lives and to provide healthy techniques for coping with it.  Learning Barriers/Preferences:  Learning Barriers/Preferences - 01/27/24 1413       Learning Barriers/Preferences   Learning Barriers None    Learning Preferences Skilled Demonstration;Pictoral;Written Material;Computer/Internet;Group Instruction;Individual Instruction          Education Topics:  Knowledge Questionnaire Score:  Knowledge Questionnaire Score - 01/27/24 1413       Knowledge Questionnaire Score   Pre Score 22/24          Core Components/Risk Factors/Patient Goals at Admission:  Personal Goals and Risk Factors at Admission - 01/27/24 1416       Core Components/Risk Factors/Patient Goals on Admission   Diabetes Yes    Intervention Provide education about signs/symptoms and action to take for hypo/hyperglycemia.;Provide education about proper nutrition, including hydration, and aerobic/resistive exercise prescription along with prescribed medications to achieve blood glucose in normal ranges: Fasting glucose 65-99 mg/dL    Expected Outcomes Short Term: Participant verbalizes understanding of the signs/symptoms and immediate care of  hyper/hypoglycemia, proper foot care and importance of medication, aerobic/resistive exercise and nutrition plan for blood glucose control.;Long Term: Attainment of HbA1C < 7%.    Hypertension Yes    Intervention Provide education on lifestyle modifcations including regular physical activity/exercise, weight management, moderate sodium restriction and increased consumption of fresh fruit, vegetables, and low fat dairy, alcohol moderation, and smoking cessation.;Monitor prescription use compliance.    Expected Outcomes Short Term: Continued assessment and intervention until BP is < 140/54mm HG in hypertensive participants. < 130/28mm HG in hypertensive participants with diabetes, heart failure or chronic kidney disease.;Long Term: Maintenance of blood pressure at goal levels.    Lipids Yes    Intervention Provide education and support for participant on nutrition & aerobic/resistive exercise along with prescribed medications to achieve LDL 70mg , HDL >40mg .    Expected Outcomes Short Term: Participant states understanding of desired cholesterol values and is compliant with medications prescribed. Participant is following exercise prescription and nutrition guidelines.;Long Term: Cholesterol controlled with medications as prescribed, with individualized exercise RX and with personalized nutrition plan. Value goals: LDL < 70mg , HDL > 40 mg.    Personal Goal Other Yes    Personal Goal S.T Get back to work and travel L.T Feel better than before, endurance    Intervention Will continue to monitor pt and progress workloads as tolerated without sign or symptom    Expected Outcomes Pt will achive his goals          Core Components/Risk Factors/Patient Goals Review:   Goals and Risk Factor Review     Row Name 01/31/24 0837 02/15/24 0856 03/15/24 0814         Core Components/Risk Factors/Patient Goals Review   Personal Goals Review Diabetes;Heart Failure;Lipids;Stress Diabetes;Heart  Failure;Lipids;Stress Diabetes;Heart Failure;Lipids;Stress  Review Borden started cardiac rehab on 01/31/24. Landers did well with exercise. Vital signs and CBG's were stable. Kanoa continues to tolerate cardiac rehab exercise well and has increased his METs since starting on 01/31/24 Dustan is tolerating cardiac rehab exercise well and has increased his METs within the last 30 days     Expected Outcomes Rayford will continue to participate in cardiac rehab for exercise, nutriton and lifestyle modifications Wataru will continue to participate in cardiac rehab for exercise, nutriton and lifestyle modifications Josaiah will continue to participate in cardiac rehab for exercise, nutriton and lifestyle modifications        Core Components/Risk Factors/Patient Goals at Discharge (Final Review):   Goals and Risk Factor Review - 03/15/24 0814       Core Components/Risk Factors/Patient Goals Review   Personal Goals Review Diabetes;Heart Failure;Lipids;Stress    Review Aarish is tolerating cardiac rehab exercise well and has increased his METs within the last 30 days    Expected Outcomes Purcell will continue to participate in cardiac rehab for exercise, nutriton and lifestyle modifications          ITP Comments:  ITP Comments     Row Name 01/27/24 1354 01/31/24 0835 02/15/24 0854 03/15/24 0813     ITP Comments Dr. Wilbert turner medical director. Introduction to the Pritikin education program/intensive cardiac rehab. Initial orientation packet reviewed with patient. 30 Day ITP Review. Eason started cardiac rehab on 01/31/24. Rihan did well with exercise. 30 Day ITP Review. Jerol is doing well with exercise at cardiac rehab. 30 Day ITP Review. Terrius continues to do well with exercise at cardiac rehab.       Comments: see ITP comments

## 2024-03-20 NOTE — Research (Addendum)
 [facility]     FOLLOW-UP PHONE CALLS  SUBJECT ID:  [id number] Trainer's name: Esteven Overfelt Trainer's signature: on Delegation of Authority Log Date of the Follow up call:   20-Mar-2024  Visit Number: 4 Start time of the Follow up call:    1240             End time of call: 1250    Q1;  Was the follow up phone call done with the subject?  [x]   YES  []   NO           If NO, check the following:      Q2:    Was the follow up phone call done with a family member               Or caregiver?    []   YES   [x]   NO          Make note about reasons ___________________________________    AUTOINJECTOR LABEL:  Still legible?  [x]   Yes  []   No     Q3:  Did any of the following occur?   []   Death       []   Hospitalization (any cause)         []   Use of autoinjector  Make note about the type of event, and when it occurred. In case of hospitalization: the Location of the hospital and/or the treating physician's contact details  ____________ ____________________________________________________________________ ____________________________________________________________________    Q4:  Did the subject develop any condition which is an          exclusion criterion?  []   YES  [x]   NO   Take note about the occurrence of any exclusion criterion after randomization: _________________________________________________________________ __________________________________________________________________  Q5: Was there any change in subject's antithrombotic therapy?   []   YES  [x]   NO     Take note about any change of antithrombotic treatment: _______________________ ____________________________________________________________________ ____________________________________________________________________      SHARYLE OF THE STUDY-SPECFIC TRAINING  Q6: Did the subject correctly reply to the following questions?       A.  What are common heart attack symptoms?      [x]   YES    []   NO      B.  What has to be done in case any of those symptoms occurs? [x]   YES  []   NO      C.  What are the main steps to perform a self-injection?  [x]   YES  []   NO             If NO, then report which step/s was/were missing:        []   Choose injection site (abdomen or thigh)       []   Twist cap off       []   Pinch skin and place the study autoinjector       []   Firmly push down and hold for 3 seconds       D. What has to be done immediately after an injection?  [x]   YES   []   NO          If NO, then report which step/s was/were missing:       []   Call  for emergency medical help       []   Show the autoinjector to the emergency medical responder       E.  Does the subject recall where s/he keeps/ stores the  autoinjectors? [x]  YES  []  NO          Note the place of storage and any corrective explanation if needed below __________________________________________________________________ __________________________________________________________________  TRAINING REFRESHER   Q7:  Is a training refresher needed?      []  YES  [x]  NO        If YES, indicate items that have to be refreshed. More than one may apply:               []   Heart attack symptoms             []   Actions to be taken following heart attack symptoms             []   Steps to perform the self-injection and follow-up actions to be taken   []   Other, Specify  _________________________________________        Current Outpatient Medications:    aspirin  EC 81 MG tablet, Take 81 mg by mouth daily. Swallow whole., Disp: , Rfl:    clopidogrel  (PLAVIX ) 75 MG tablet, Take 1 tablet (75 mg total) by mouth daily., Disp: 90 tablet, Rfl: 3   Dulaglutide  (TRULICITY ) 3 MG/0.5ML SOAJ, Inject 3 mg into the skin once a week., Disp: 6 mL, Rfl: 1   empagliflozin  (JARDIANCE ) 25 MG TABS tablet, Take 1 tablet (25 mg total) by mouth daily before breakfast., Disp: 90 tablet, Rfl: 1   Evolocumab  (REPATHA  SURECLICK) 140 MG/ML SOAJ,  Inject 140 mg into the skin every 14 (fourteen) days., Disp: 2 mL, Rfl: 11   Insulin  Pen Needle (PEN NEEDLES) 32G X 4 MM MISC, Use with Victoza  to inject once daily, Disp: 90 each, Rfl: 3   Lancets (ONETOUCH DELICA PLUS LANCET33G) MISC, USE AS DIRECTED TO TEST BLOOD GLUCOSE, Disp: 100 each, Rfl: 1   metFORMIN  (GLUCOPHAGE ) 1000 MG tablet, Take 1 tablet (1,000 mg total) by mouth 2 (two) times daily with a meal., Disp: 180 tablet, Rfl: 1   Multiple Vitamins-Minerals (MULTIVITAMIN WITH MINERALS) tablet, Take 1 tablet by mouth daily., Disp: , Rfl:    nitroGLYCERIN  (NITROSTAT ) 0.4 MG SL tablet, Place 1 tablet (0.4 mg total) under the tongue every 5 (five) minutes as needed for chest pain. If you need more than 3 doses in 24 hours, call 911 or go to nearest ER, Disp: 25 tablet, Rfl: 3   predniSONE  (DELTASONE ) 20 MG tablet, Take 1 tablet (20 mg total) by mouth daily in case of gout flare-must follow-up with primary care physician if does not resolve with this (Patient taking differently: Take 1 tablet (20 mg total) by mouth daily in case of gout flare-must follow-up with primary care physician if does not resolve with this), Disp: 3 tablet, Rfl: 0   Study - [facility] - selatogrel 16 mg/0.5 mL or placebo SQ injection (PI-Christopher), Inject 0.5 mLs (16 mg total) into the skin as needed (As needed for for symptoms of a heart attack (chest pain).)., Disp: 1 mL, Rfl: 0      MOBILITY 1. You have no problems walking? [x]  2. You have slight problems walking? []  3. You have moderate problems walking? []  4. You have severe problems walking? []  5. You are unable to walk? []   SELF-CARE 1. You have no problems washing or dressing yourself? [x]  2. You have slight problems washing or dressing yourself? []  3. You have moderate problems washing or dressing yourself? []  4. You have severe problems washing or dressing yourself? []  5. You are unable to wash or  dress yourself? []   USUAL ACTIVITIES 1. You have no  problems doing your usual activities? [x]  2. You have slight problems doing your usual activities? []  3. You have moderate problems doing your usual activities? []  4. You have severe problems doing your usual activities? []  5. You are unable to do your usual activities? []   PAIN / DISCOMFORT 1. You have no pain or discomfort? [x]  2. You have slight pain or discomfort? []  3. You have moderate pain or discomfort? []  4. You have severe pain or discomfort? []  5. You have extreme pain or discomfort? []   ANXIETY / DEPRESSION 1. You are not anxious or depressed? [x]  2. You are slightly anxious or depressed? []  3. You are moderately anxious or depressed? []  4. You are severely anxious or depressed? []  5. You are extremely anxious or depressed? []   The best health you can imagine is marked 100 (one hundred) at the top of the scale and the worst health you can imagine is marked 0 (zero) at the bottom.    THE RESPONDENT'S HEALTH TODAY =  80

## 2024-03-22 ENCOUNTER — Encounter (HOSPITAL_COMMUNITY)
Admission: RE | Admit: 2024-03-22 | Discharge: 2024-03-22 | Disposition: A | Discharge status: Home / Self Care | Source: Ambulatory Visit | Attending: Cardiovascular Disease | Admitting: Cardiovascular Disease

## 2024-03-22 DIAGNOSIS — I214 Non-ST elevation (NSTEMI) myocardial infarction: Secondary | ICD-10-CM

## 2024-03-22 DIAGNOSIS — Z951 Presence of aortocoronary bypass graft: Secondary | ICD-10-CM | POA: Diagnosis not present

## 2024-03-24 ENCOUNTER — Other Ambulatory Visit: Payer: Self-pay

## 2024-03-24 ENCOUNTER — Encounter (HOSPITAL_COMMUNITY)
Admission: RE | Admit: 2024-03-24 | Discharge: 2024-03-24 | Disposition: A | Discharge status: Home / Self Care | Source: Ambulatory Visit | Attending: Cardiovascular Disease | Admitting: Cardiovascular Disease

## 2024-03-24 DIAGNOSIS — I214 Non-ST elevation (NSTEMI) myocardial infarction: Secondary | ICD-10-CM

## 2024-03-24 DIAGNOSIS — Z951 Presence of aortocoronary bypass graft: Secondary | ICD-10-CM | POA: Diagnosis not present

## 2024-03-27 ENCOUNTER — Encounter (HOSPITAL_COMMUNITY)
Admission: RE | Admit: 2024-03-27 | Discharge: 2024-03-27 | Disposition: A | Discharge status: Home / Self Care | Source: Ambulatory Visit | Attending: Cardiovascular Disease | Admitting: Cardiovascular Disease

## 2024-03-27 DIAGNOSIS — I214 Non-ST elevation (NSTEMI) myocardial infarction: Secondary | ICD-10-CM

## 2024-03-27 DIAGNOSIS — Z951 Presence of aortocoronary bypass graft: Secondary | ICD-10-CM | POA: Diagnosis not present

## 2024-03-29 ENCOUNTER — Encounter (HOSPITAL_COMMUNITY)
Admission: RE | Admit: 2024-03-29 | Discharge: 2024-03-29 | Disposition: A | Discharge status: Home / Self Care | Source: Ambulatory Visit | Attending: Cardiovascular Disease | Admitting: Cardiovascular Disease

## 2024-03-29 DIAGNOSIS — I214 Non-ST elevation (NSTEMI) myocardial infarction: Secondary | ICD-10-CM

## 2024-03-29 DIAGNOSIS — Z951 Presence of aortocoronary bypass graft: Secondary | ICD-10-CM

## 2024-03-31 ENCOUNTER — Encounter (HOSPITAL_COMMUNITY)
Admission: RE | Admit: 2024-03-31 | Discharge: 2024-03-31 | Disposition: A | Discharge status: Home / Self Care | Source: Ambulatory Visit | Attending: Cardiovascular Disease | Admitting: Cardiovascular Disease

## 2024-03-31 DIAGNOSIS — I214 Non-ST elevation (NSTEMI) myocardial infarction: Secondary | ICD-10-CM

## 2024-03-31 DIAGNOSIS — Z951 Presence of aortocoronary bypass graft: Secondary | ICD-10-CM

## 2024-04-03 ENCOUNTER — Encounter (HOSPITAL_COMMUNITY)
Admission: RE | Admit: 2024-04-03 | Discharge: 2024-04-03 | Disposition: A | Discharge status: Home / Self Care | Source: Ambulatory Visit | Attending: Cardiovascular Disease | Admitting: Cardiovascular Disease

## 2024-04-03 DIAGNOSIS — I214 Non-ST elevation (NSTEMI) myocardial infarction: Secondary | ICD-10-CM

## 2024-04-03 DIAGNOSIS — Z951 Presence of aortocoronary bypass graft: Secondary | ICD-10-CM

## 2024-04-05 ENCOUNTER — Encounter (HOSPITAL_COMMUNITY)
Admission: RE | Admit: 2024-04-05 | Discharge: 2024-04-05 | Disposition: A | Discharge status: Home / Self Care | Source: Ambulatory Visit | Attending: Cardiovascular Disease | Admitting: Cardiovascular Disease

## 2024-04-05 DIAGNOSIS — Z951 Presence of aortocoronary bypass graft: Secondary | ICD-10-CM

## 2024-04-05 DIAGNOSIS — I214 Non-ST elevation (NSTEMI) myocardial infarction: Secondary | ICD-10-CM

## 2024-04-07 ENCOUNTER — Telehealth: Payer: Self-pay

## 2024-04-07 ENCOUNTER — Other Ambulatory Visit

## 2024-04-07 ENCOUNTER — Encounter (HOSPITAL_COMMUNITY)
Admission: RE | Admit: 2024-04-07 | Discharge: 2024-04-07 | Disposition: A | Discharge status: Home / Self Care | Source: Ambulatory Visit | Attending: Cardiovascular Disease | Admitting: Cardiovascular Disease

## 2024-04-07 ENCOUNTER — Other Ambulatory Visit (HOSPITAL_COMMUNITY): Payer: Self-pay

## 2024-04-07 ENCOUNTER — Other Ambulatory Visit (INDEPENDENT_AMBULATORY_CARE_PROVIDER_SITE_OTHER)

## 2024-04-07 ENCOUNTER — Other Ambulatory Visit: Payer: Self-pay

## 2024-04-07 DIAGNOSIS — Z951 Presence of aortocoronary bypass graft: Secondary | ICD-10-CM | POA: Diagnosis not present

## 2024-04-07 DIAGNOSIS — E1165 Type 2 diabetes mellitus with hyperglycemia: Secondary | ICD-10-CM

## 2024-04-07 DIAGNOSIS — R7989 Other specified abnormal findings of blood chemistry: Secondary | ICD-10-CM | POA: Diagnosis not present

## 2024-04-07 DIAGNOSIS — Z23 Encounter for immunization: Secondary | ICD-10-CM | POA: Diagnosis not present

## 2024-04-07 DIAGNOSIS — I214 Non-ST elevation (NSTEMI) myocardial infarction: Secondary | ICD-10-CM

## 2024-04-07 LAB — COMPREHENSIVE METABOLIC PANEL WITH GFR
ALT: 10 U/L (ref 0–53)
AST: 16 U/L (ref 0–37)
Albumin: 4.2 g/dL (ref 3.5–5.2)
Alkaline Phosphatase: 57 U/L (ref 39–117)
BUN: 20 mg/dL (ref 6–23)
CO2: 29 meq/L (ref 19–32)
Calcium: 9.2 mg/dL (ref 8.4–10.5)
Chloride: 101 meq/L (ref 96–112)
Creatinine, Ser: 0.86 mg/dL (ref 0.40–1.50)
GFR: 96.15 mL/min (ref >60.00)
Glucose, Bld: 78 mg/dL (ref 70–99)
Potassium: 5 meq/L (ref 3.5–5.1)
Sodium: 135 meq/L (ref 135–145)
Total Bilirubin: 0.4 mg/dL (ref 0.2–1.2)
Total Protein: 6.5 g/dL (ref 6.0–8.3)

## 2024-04-07 LAB — MICROALBUMIN / CREATININE URINE RATIO
Creatinine,U: 22.5 mg/dL
Microalb Creat Ratio: UNDETERMINED mg/g (ref 0.0–30.0)
Microalb, Ur: <0.7 mg/dL

## 2024-04-07 LAB — HEMOGLOBIN A1C: Hgb A1c MFr Bld: 6.6 % — ABNORMAL HIGH (ref 4.6–6.5)

## 2024-04-07 NOTE — Progress Notes (Signed)
 Marcus Hart is a 58 y.o. male presents in office today for a nurse visit for Hep B Immunization.   Patient Injection was given in the  Left deltoid. Patient tolerated injection well.   Patient's next injection due N/A, appt made? no  [employer]

## 2024-04-07 NOTE — Telephone Encounter (Signed)
 Pharmacy Patient Advocate Encounter   Received notification from Onbase that prior authorization for Jardiance  25MG  Tabs is required/requested.   Insurance verification completed.   The patient is insured through [employer].   Per test claim: PA required; PA submitted to above mentioned insurance via Latent Key/confirmation #/EOC [id number] Status is pending

## 2024-04-10 ENCOUNTER — Encounter (HOSPITAL_COMMUNITY)
Admission: RE | Admit: 2024-04-10 | Discharge: 2024-04-10 | Disposition: A | Discharge status: Home / Self Care | Source: Ambulatory Visit | Attending: Cardiovascular Disease | Admitting: Cardiovascular Disease

## 2024-04-10 DIAGNOSIS — I214 Non-ST elevation (NSTEMI) myocardial infarction: Secondary | ICD-10-CM

## 2024-04-10 DIAGNOSIS — Z951 Presence of aortocoronary bypass graft: Secondary | ICD-10-CM | POA: Diagnosis not present

## 2024-04-10 NOTE — Telephone Encounter (Signed)
 Pharmacy Patient Advocate Encounter  Received notification from [employer]  that Prior Authorization for Jardiance  25MG  Tabs  has been APPROVED from 04/07/24 to 04/07/25   PA #/Case ID/Reference #: [id number]

## 2024-04-10 NOTE — Telephone Encounter (Signed)
 Called patient to inform him of approval and verbalized understanding

## 2024-04-11 ENCOUNTER — Encounter: Payer: Self-pay | Admitting: Pharmacist

## 2024-04-11 ENCOUNTER — Ambulatory Visit: Payer: Self-pay | Admitting: Family Medicine

## 2024-04-11 DIAGNOSIS — R748 Abnormal levels of other serum enzymes: Secondary | ICD-10-CM

## 2024-04-11 DIAGNOSIS — E7841 Elevated Lipoprotein(a): Secondary | ICD-10-CM

## 2024-04-12 ENCOUNTER — Encounter (HOSPITAL_COMMUNITY)
Admission: RE | Admit: 2024-04-12 | Discharge: 2024-04-12 | Disposition: A | Discharge status: Home / Self Care | Source: Ambulatory Visit | Attending: Cardiovascular Disease | Admitting: Cardiovascular Disease

## 2024-04-12 DIAGNOSIS — I214 Non-ST elevation (NSTEMI) myocardial infarction: Secondary | ICD-10-CM

## 2024-04-12 DIAGNOSIS — Z951 Presence of aortocoronary bypass graft: Secondary | ICD-10-CM

## 2024-04-14 ENCOUNTER — Encounter (HOSPITAL_COMMUNITY)

## 2024-04-17 ENCOUNTER — Encounter (HOSPITAL_COMMUNITY)
Admission: RE | Admit: 2024-04-17 | Discharge: 2024-04-17 | Disposition: A | Source: Ambulatory Visit | Attending: Cardiovascular Disease

## 2024-04-17 VITALS — Ht 69.0 in | Wt 162.3 lb

## 2024-04-17 DIAGNOSIS — Z951 Presence of aortocoronary bypass graft: Secondary | ICD-10-CM | POA: Insufficient documentation

## 2024-04-17 DIAGNOSIS — I214 Non-ST elevation (NSTEMI) myocardial infarction: Secondary | ICD-10-CM | POA: Diagnosis present

## 2024-04-17 NOTE — Progress Notes (Signed)
 Discharge Progress Report  Patient Details  Name: Marcus Hart MRN: [id number] Date of Birth: Jan 25, 1966 Referring Provider:   Flowsheet Row CARDIAC REHAB PHASE II ORIENTATION from 01/27/2024 in [facility]  Referring Provider Dr. Ozell Fell NP     Number of Visits: 33  Reason for Discharge:  Patient reached a stable level of exercise. Patient independent in their exercise. Patient has met program and personal goals.  Smoking History:  Social History   Tobacco Use  Smoking Status Never  Smokeless Tobacco Never    Diagnosis:  NSTEMI (non-ST elevated myocardial infarction) (HCC)  S/P CABG x 2  ADL UCSD:   Initial Exercise Prescription:  Initial Exercise Prescription - 01/27/24 1400       Date of Initial Exercise RX and Referring Provider   Date 01/27/24    Referring Provider Dr. Ozell Fell NP    Expected Discharge Date 03/08/24      Recumbant Bike   Level 2    RPM 50    Watts 15    Minutes 15    METs 2.5      NuStep   Level 2    SPM 75    Minutes 15    METs 1.9      Prescription Details   Frequency (times per week) 3    Duration Progress to 30 minutes of continuous aerobic without signs/symptoms of physical distress      Intensity   THRR 40-80% of Max Heartrate 65-131    Ratings of Perceived Exertion 11-13    Perceived Dyspnea 0-4      Progression   Progression Continue progressive overload as per policy without signs/symptoms or physical distress.      Resistance Training   Training Prescription Yes    Weight 3    Reps 10-15          Discharge Exercise Prescription (Final Exercise Prescription Changes):  Exercise Prescription Changes - 04/19/24 1626       Response to Exercise   Blood Pressure (Admit) 126/62    Blood Pressure (Exit) 132/78    Heart Rate (Admit) 69 bpm    Heart Rate (Exercise) 157 bpm    Heart Rate (Exit) 84 bpm    Rating of Perceived  Exertion (Exercise) 8    Perceived Dyspnea (Exercise) 0    Symptoms none    Comments Pt graduated the [employer] program    Duration Progress to 30 minutes of  aerobic without signs/symptoms of physical distress    Intensity THRR unchanged      Progression   Progression Continue to progress workloads to maintain intensity without signs/symptoms of physical distress.    Average METs 7.15      Resistance Training   Training Prescription No    Weight 5    Reps 10-15    Time 5 Minutes      Recumbant Bike   Level 4    RPM 102    Watts 98    Minutes 15    METs 6.3      Recumbant Elliptical   Level 4    RPM 90    Watts 148    Minutes 15    METs 8      Home Exercise Plan   Plans to continue exercise at [employer] (comment)    Frequency Add 2 additional days to program exercise sessions.    Initial Home Exercises Provided 02/18/24  Functional Capacity:  6 Minute Walk     Row Name 01/27/24 1405 04/17/24 1414       6 Minute Walk   Phase Initial Discharge    Distance 1370 feet 2115 feet    Distance % Change -- 54.38 %    Distance Feet Change -- 745 ft    Walk Time 6 minutes 6 minutes    # of Rest Breaks 0 0    MPH 2.5 4.01    METS 2.78 5.33    RPE 8 10    Perceived Dyspnea  0 0    VO2 Peak 9.73 18.66    Symptoms Yes (comment) No    Comments Pre-orthostatic sitting 110/70 standing 100/62 --    Resting HR 60 bpm 55 bpm    Resting BP 110/70 120/58    Resting Oxygen Saturation  100 % --    Exercise Oxygen Saturation  during 6 min walk 100 % --    Max Ex. HR 83 bpm 111 bpm    Max Ex. BP 124/68 124/62    2 Minute Post BP 114/62 --       Psychological, QOL, Others - Outcomes: PHQ 2/9:    04/17/2024    8:08 AM 01/27/2024    1:58 PM 01/06/2024   11:18 AM 10/01/2023    8:11 AM 06/18/2023    9:45 AM  Depression screen PHQ 2/9  Decreased Interest 0 0 0 0 0  Down, Depressed, Hopeless 0 0 0 0 0  PHQ - 2 Score 0 0 0 0 0  Altered sleeping 0 0 0 0 0   Tired, decreased energy 0 0 2 1 0  Change in appetite 0 0 0 1 0  Feeling bad or failure about yourself  0 0 0 0 0  Trouble concentrating 0 0 0 0 0  Moving slowly or fidgety/restless 0 0 0 0 0  Suicidal thoughts 0 0 0 0 0  PHQ-9 Score 0 0  2  2  0   Difficult doing work/chores Not difficult at all  Not difficult at all Not difficult at all      Data saved with a previous flowsheet row definition    Quality of Life:  Quality of Life - 04/17/24 1411       Quality of Life   Select Quality of Life      Quality of Life Scores   Health/Function Post 27.6 %    Socioeconomic Post 26.57 %    Psych/Spiritual Post 25.71 %    Family Post 28.8 %    GLOBAL Post 27.18 %          Personal Goals: Goals established at orientation with interventions provided to work toward goal.  Personal Goals and Risk Factors at Admission - 01/27/24 1416       Core Components/Risk Factors/Patient Goals on Admission   Diabetes Yes    Intervention Provide education about signs/symptoms and action to take for hypo/hyperglycemia.;Provide education about proper nutrition, including hydration, and aerobic/resistive exercise prescription along with prescribed medications to achieve blood glucose in normal ranges: Fasting glucose 65-99 mg/dL    Expected Outcomes Short Term: Participant verbalizes understanding of the signs/symptoms and immediate care of hyper/hypoglycemia, proper foot care and importance of medication, aerobic/resistive exercise and nutrition plan for blood glucose control.;Long Term: Attainment of HbA1C < 7%.    Hypertension Yes    Intervention Provide education on lifestyle modifcations including regular physical activity/exercise, weight management, moderate sodium restriction and increased  consumption of fresh fruit, vegetables, and low fat dairy, alcohol moderation, and smoking cessation.;Monitor prescription use compliance.    Expected Outcomes Short Term: Continued assessment and intervention  until BP is < 140/80mm HG in hypertensive participants. < 130/9mm HG in hypertensive participants with diabetes, heart failure or chronic kidney disease.;Long Term: Maintenance of blood pressure at goal levels.    Lipids Yes    Intervention Provide education and support for participant on nutrition & aerobic/resistive exercise along with prescribed medications to achieve LDL 70mg , HDL >40mg .    Expected Outcomes Short Term: Participant states understanding of desired cholesterol values and is compliant with medications prescribed. Participant is following exercise prescription and nutrition guidelines.;Long Term: Cholesterol controlled with medications as prescribed, with individualized exercise RX and with personalized nutrition plan. Value goals: LDL < 70mg , HDL > 40 mg.    Personal Goal Other Yes    Personal Goal S.T Get back to work and travel L.T Feel better than before, endurance    Intervention Will continue to monitor pt and progress workloads as tolerated without sign or symptom    Expected Outcomes Pt will achive his goals           Personal Goals Discharge:  Goals and Risk Factor Review     Row Name 01/31/24 0837 02/15/24 0856 03/15/24 0814         Core Components/Risk Factors/Patient Goals Review   Personal Goals Review Diabetes;Heart Failure;Lipids;Stress Diabetes;Heart Failure;Lipids;Stress Diabetes;Heart Failure;Lipids;Stress     Review Javanni started cardiac rehab on 01/31/24. Derryl did well with exercise. Vital signs and CBG's were stable. Jguadalupe continues to tolerate cardiac rehab exercise well and has increased his METs since starting on 01/31/24 Emrah is tolerating cardiac rehab exercise well and has increased his METs within the last 30 days     Expected Outcomes Kadien will continue to participate in cardiac rehab for exercise, nutriton and lifestyle modifications Jeriko will continue to participate in cardiac rehab for exercise, nutriton and lifestyle modifications Sharmarke  will continue to participate in cardiac rehab for exercise, nutriton and lifestyle modifications        Exercise Goals and Review:  Exercise Goals     Row Name 01/27/24 1410             Exercise Goals   Increase Physical Activity Yes       Intervention Provide advice, education, support and counseling about physical activity/exercise needs.;Develop an individualized exercise prescription for aerobic and resistive training based on initial evaluation findings, risk stratification, comorbidities and participant's personal goals.       Expected Outcomes Short Term: Attend rehab on a regular basis to increase amount of physical activity.;Long Term: Exercising regularly at least 3-5 days a week.;Long Term: Add in home exercise to make exercise part of routine and to increase amount of physical activity.       Increase Strength and Stamina Yes       Intervention Provide advice, education, support and counseling about physical activity/exercise needs.;Develop an individualized exercise prescription for aerobic and resistive training based on initial evaluation findings, risk stratification, comorbidities and participant's personal goals.       Expected Outcomes Short Term: Increase workloads from initial exercise prescription for resistance, speed, and METs.;Short Term: Perform resistance training exercises routinely during rehab and add in resistance training at home;Long Term: Improve cardiorespiratory fitness, muscular endurance and strength as measured by increased METs and functional capacity ( )       Able to understand and use rate of  perceived exertion (RPE) scale Yes       Intervention Provide education and explanation on how to use RPE scale       Expected Outcomes Short Term: Able to use RPE daily in rehab to express subjective intensity level;Long Term:  Able to use RPE to guide intensity level when exercising independently       Knowledge and understanding of Target Heart Rate Range  (THRR) Yes       Intervention Provide education and explanation of THRR including how the numbers were predicted and where they are located for reference       Expected Outcomes Short Term: Able to state/look up THRR;Long Term: Able to use THRR to govern intensity when exercising independently;Short Term: Able to use daily as guideline for intensity in rehab       Understanding of Exercise Prescription Yes       Intervention Provide education, explanation, and written materials on patient's individual exercise prescription       Expected Outcomes Short Term: Able to explain program exercise prescription;Long Term: Able to explain home exercise prescription to exercise independently          Exercise Goals Re-Evaluation:  Exercise Goals Re-Evaluation     Row Name 01/31/24 0825 02/18/24 0850 03/17/24 0844 04/19/24 1630       Exercise Goal Re-Evaluation   Exercise Goals Review Increase Physical Activity;Understanding of Exercise Prescription;Increase Strength and Stamina;Knowledge and understanding of Target Heart Rate Range (THRR);Able to understand and use rate of perceived exertion (RPE) scale Increase Physical Activity;Understanding of Exercise Prescription;Increase Strength and Stamina;Knowledge and understanding of Target Heart Rate Range (THRR);Able to understand and use rate of perceived exertion (RPE) scale Increase Physical Activity;Understanding of Exercise Prescription;Increase Strength and Stamina;Knowledge and understanding of Target Heart Rate Range (THRR);Able to understand and use rate of perceived exertion (RPE) scale Increase Physical Activity;Understanding of Exercise Prescription;Increase Strength and Stamina;Knowledge and understanding of Target Heart Rate Range (THRR);Able to understand and use rate of perceived exertion (RPE) scale    Comments Pt first day in the [facility] program. Pt tolerated exercise well with an average MET level of 2.2. Pt is off to a good start and is  learning his THRR, RPE and ExRx Reviewed MET's, goals and home ExRx. Pt tolerated exercise well with an average MET level of 3.6. Pt is doing well and progressing WL's and MET's. INC WL on nustep today and set some new goals he's doing well overall and is increasing strength and endurance. He plans to add in exercise on his own for 1-2 days for 30 mins at the [facility], walking or treadmill. Reviewed MET's and goals. Pt tolerated exercise well with an average MET level of 5.35. Pt is doing well and plans to change equipment next session to change the routine. He feels good with his goals and says he's learning a lot. He is noticing an increase in strength and stamina Pt graduated the [employer] program. Pt tolerated exercise well with an average MET level of 7.15. Pt did well in the program and increased on his post walk test by 787ft for a total of 2156ft. He is feeling better overall and more confident with exercise. He will exercise on his own by going to the [facility], walking and treadmill 4 days for 30-60 mins.    Expected Outcomes Will continue to monitor pt and progress workloads as tolerated without sign or symptom Will continue to monitor pt and progress workloads as tolerated without sign  or symptom Will continue to monitor pt and progress workloads as tolerated without sign or symptom Will continue to monitor pt and progress workloads as tolerated without sign or symptom       Nutrition & Weight - Outcomes:  Pre Biometrics - 01/27/24 1400       Pre Biometrics   Waist Circumference 35 inches    Hip Circumference 38 inches    Waist to Hip Ratio 0.92 %    Triceps Skinfold 16 mm    % Body Fat 23.8 %    Grip Strength 34 kg    Flexibility 14.25 in    Single Leg Stand 30 seconds          Post Biometrics - 04/17/24 1415        Post  Biometrics   Height 5' 9 (1.753 m)    Weight 73.6 kg    Waist Circumference 32 inches    Hip Circumference 37 inches    Waist to Hip Ratio 0.86 %     BMI (Calculated) 23.95    Triceps Skinfold 11 mm    % Body Fat 20.7 %    Grip Strength 40 kg    Flexibility 17 in    Single Leg Stand 30 seconds          Nutrition:   Nutrition Discharge:  Nutrition Assessments - 04/17/24 1412       Rate Your Plate Scores   Post Score 85          Education Questionnaire Score:  Knowledge Questionnaire Score - 04/17/24 1413       Knowledge Questionnaire Score   Post Score 21/24         Pt graduates from  Intensive/Traditional cardiac rehab program today with completion of 60 exercise and education sessions. Pt maintained good attendance and progressed nicely during their participation in rehab as evidenced by increased MET level.   Medication list reconciled. Repeat  PHQ score-0.  Pt has made significant lifestyle changes and should be commended for his success.  Nizar achieved his goals during cardiac rehab.   Pt plans to continue exercise by walking 3-4x/week at [facility].  Goals reviewed with patient; copy given to patient.

## 2024-04-18 NOTE — Telephone Encounter (Signed)
Lab reminder mailed to patient.

## 2024-04-19 ENCOUNTER — Encounter (HOSPITAL_COMMUNITY)
Admission: RE | Admit: 2024-04-19 | Discharge: 2024-04-19 | Disposition: A | Source: Ambulatory Visit | Attending: Cardiovascular Disease

## 2024-04-19 DIAGNOSIS — I214 Non-ST elevation (NSTEMI) myocardial infarction: Secondary | ICD-10-CM | POA: Diagnosis not present

## 2024-04-19 DIAGNOSIS — Z951 Presence of aortocoronary bypass graft: Secondary | ICD-10-CM

## 2024-04-21 ENCOUNTER — Other Ambulatory Visit: Payer: Self-pay

## 2024-04-27 ENCOUNTER — Other Ambulatory Visit: Payer: Self-pay

## 2024-04-27 MED ORDER — JARDIANCE 25 MG PO TABS
25.0000 mg | ORAL_TABLET | Freq: Every day | ORAL | 2 refills | Status: AC
  Filled 2024-06-02 – 2024-06-23 (×2): qty 90, 90d supply, fill #0

## 2024-04-27 MED ORDER — TRULICITY 3 MG/0.5ML ~~LOC~~ SOAJ
3.0000 mg | SUBCUTANEOUS | 4 refills | Status: AC
  Filled 2024-04-27: qty 2, 28d supply, fill #0
  Filled 2024-06-02 (×3): qty 2, 28d supply, fill #1

## 2024-04-27 MED ORDER — METFORMIN HCL 1000 MG PO TABS
1000.0000 mg | ORAL_TABLET | Freq: Two times a day (BID) | ORAL | 2 refills | Status: AC
  Filled 2024-04-27 – 2024-06-02 (×3): qty 180, 90d supply, fill #0

## 2024-05-03 ENCOUNTER — Other Ambulatory Visit: Payer: Self-pay

## 2024-05-12 NOTE — Telephone Encounter (Signed)
 Called and let detailed message about labs per DPR.

## 2024-05-16 ENCOUNTER — Other Ambulatory Visit: Payer: Self-pay

## 2024-05-23 ENCOUNTER — Other Ambulatory Visit: Payer: Self-pay

## 2024-05-26 ENCOUNTER — Encounter

## 2024-05-26 VITALS — BP 116/66 | HR 76

## 2024-05-26 DIAGNOSIS — Z006 Encounter for examination for normal comparison and control in clinical research program: Secondary | ICD-10-CM

## 2024-05-26 MED ORDER — STUDY - SOS-AMI - SELATOGREL 16 MG/0.5 ML OR PLACEBO SQ INJECTION (PI-CHRISTOPHER): 16.0000 mg | INJECTION | SUBCUTANEOUS | 0 refills | Status: AC | PRN

## 2024-05-26 NOTE — Research (Signed)
"                              [facility]       SUBJECT ID:  [id number] Trainer's name: Rosaline Rancher Trainer's signature: on Delegation of Authority Log Date of the Follow up:     09/Jan/2026    Kits returned: [id number], [id number]    Kits received: [id number], [id number]     Current Medications[1]     [1]  Current Outpatient Medications:    aspirin  EC 81 MG tablet, Take 81 mg by mouth daily. Swallow whole., Disp: , Rfl:    clopidogrel  (PLAVIX ) 75 MG tablet, Take 1 tablet (75 mg total) by mouth daily., Disp: 90 tablet, Rfl: 3   Dulaglutide  (TRULICITY ) 3 MG/0.5ML SOAJ, Inject 3 mg into the skin once a week., Disp: 6 mL, Rfl: 1   empagliflozin  (JARDIANCE ) 25 MG TABS tablet, Take 1 tablet (25 mg total) by mouth daily before breakfast., Disp: 90 tablet, Rfl: 2   Evolocumab  (REPATHA  SURECLICK) 140 MG/ML SOAJ, Inject 140 mg into the skin every 14 (fourteen) days., Disp: 2 mL, Rfl: 11   Insulin  Pen Needle (PEN NEEDLES) 32G X 4 MM MISC, Use with Victoza  to inject once daily, Disp: 90 each, Rfl: 3   Lancets (ONETOUCH DELICA PLUS LANCET33G) MISC, USE AS DIRECTED TO TEST BLOOD GLUCOSE, Disp: 100 each, Rfl: 1   metFORMIN  (GLUCOPHAGE ) 1000 MG tablet, Take 1 tablet (1,000 mg total) by mouth in the morning and 1 tablet (1,000 mg total) in the evening. Take with meals., Disp: 180 tablet, Rfl: 2   Multiple Vitamins-Minerals (MULTIVITAMIN WITH MINERALS) tablet, Take 1 tablet by mouth daily., Disp: , Rfl:    nitroGLYCERIN  (NITROSTAT ) 0.4 MG SL tablet, Place 1 tablet (0.4 mg total) under the tongue every 5 (five) minutes as needed for chest pain. If you need more than 3 doses in 24 hours, call 911 or go to nearest ER, Disp: 25 tablet, Rfl: 3   predniSONE  (DELTASONE ) 20 MG tablet, Take 1 tablet (20 mg total) by mouth daily in case of gout flare-must follow-up with primary care physician if does not resolve with this, Disp: 3 tablet, Rfl: 0   Study - [facility] - selatogrel 16 mg/0.5 mL or placebo SQ injection  (PI-Christopher), Inject 0.5 mLs (16 mg total) into the skin as needed (As needed for for symptoms of a heart attack (chest pain).)., Disp: 1 mL, Rfl: 0   Dulaglutide  (TRULICITY ) 3 MG/0.5ML SOAJ, Inject 3 mg into the skin once a week., Disp: 6 mL, Rfl: 4  "

## 2024-05-29 DIAGNOSIS — Z006 Encounter for examination for normal comparison and control in clinical research program: Secondary | ICD-10-CM

## 2024-05-29 NOTE — Research (Signed)
 "                            [facility]     FOLLOW-UP PHONE CALLS  SUBJECT ID:  [id number] Trainer's name: Summerlyn Fickel Trainer's signature: on Delegation of Authority Log Date of the Follow up call:  29-May-2024  Visit Number: 5 Start time of the Follow up call:      1000           End time of call: 1010    Q1;  Was the follow up phone call done with the subject?  [x]   YES  []   NO           If NO, check the following:      Q2:    Was the follow up phone call done with a family member               Or caregiver?    []   YES   [x]   NO          Make note about reasons ___________________________________    AUTOINJECTOR LABEL:  Still legible?  [x]   Yes  []   No     Q3:  Did any of the following occur?   []   Death       []   Hospitalization (any cause)         []   Use of autoinjector  Make note about the type of event, and when it occurred. In case of hospitalization: the Location of the hospital and/or the treating physician's contact details  ____________ ____________________________________________________________________ ____________________________________________________________________    Q4:  Did the subject develop any condition which is an          exclusion criterion?  []   YES  [x]   NO   Take note about the occurrence of any exclusion criterion after randomization: _________________________________________________________________ __________________________________________________________________  Q5: Was there any change in subject's antithrombotic therapy?   []   YES  [x]   NO     Take note about any change of antithrombotic treatment: _______________________ ____________________________________________________________________ ____________________________________________________________________      SHARYLE OF THE STUDY-SPECFIC TRAINING  Q6: Did the subject correctly reply to the following questions?       A.  What are common heart attack symptoms?      [x]   YES    []   NO      B.  What has to be done in case any of those symptoms occurs? [x]   YES  []   NO      C.  What are the main steps to perform a self-injection?  [x]   YES  []   NO             If NO, then report which step/s was/were missing:        []   Choose injection site (abdomen or thigh)       []   Twist cap off       []   Pinch skin and place the study autoinjector       []   Firmly push down and hold for 3 seconds       D. What has to be done immediately after an injection?  [x]   YES   []   NO          If NO, then report which step/s was/were missing:       []   Call  for emergency medical help       []   Show the autoinjector to the emergency medical responder       E.  Does the subject recall where s/he keeps/ stores the autoinjectors? [x]  YES  []  NO          Note the place of storage and any corrective explanation if needed below __________________________________________________________________ __________________________________________________________________  TRAINING REFRESHER   Q7:  Is a training refresher needed?      []  YES  [x]  NO        If YES, indicate items that have to be refreshed. More than one may apply:               []   Heart attack symptoms             []   Actions to be taken following heart attack symptoms             []   Steps to perform the self-injection and follow-up actions to be taken   []   Other, Specify  _________________________________________     Current Medications[1]                               [1]  Current Outpatient Medications:    aspirin  EC 81 MG tablet, Take 81 mg by mouth daily. Swallow whole., Disp: , Rfl:    clopidogrel  (PLAVIX ) 75 MG tablet, Take 1 tablet (75 mg total) by mouth daily., Disp: 90 tablet, Rfl: 3   Dulaglutide  (TRULICITY ) 3 MG/0.5ML SOAJ, Inject 3 mg into the skin once a week., Disp: 6 mL, Rfl: 1   Dulaglutide  (TRULICITY ) 3 MG/0.5ML SOAJ, Inject 3 mg into the skin once a week., Disp: 6 mL, Rfl: 4   empagliflozin   (JARDIANCE ) 25 MG TABS tablet, Take 1 tablet (25 mg total) by mouth daily before breakfast., Disp: 90 tablet, Rfl: 2   Evolocumab  (REPATHA  SURECLICK) 140 MG/ML SOAJ, Inject 140 mg into the skin every 14 (fourteen) days., Disp: 2 mL, Rfl: 11   Insulin  Pen Needle (PEN NEEDLES) 32G X 4 MM MISC, Use with Victoza  to inject once daily, Disp: 90 each, Rfl: 3   Lancets (ONETOUCH DELICA PLUS LANCET33G) MISC, USE AS DIRECTED TO TEST BLOOD GLUCOSE, Disp: 100 each, Rfl: 1   metFORMIN  (GLUCOPHAGE ) 1000 MG tablet, Take 1 tablet (1,000 mg total) by mouth in the morning and 1 tablet (1,000 mg total) in the evening. Take with meals., Disp: 180 tablet, Rfl: 2   Multiple Vitamins-Minerals (MULTIVITAMIN WITH MINERALS) tablet, Take 1 tablet by mouth daily., Disp: , Rfl:    nitroGLYCERIN  (NITROSTAT ) 0.4 MG SL tablet, Place 1 tablet (0.4 mg total) under the tongue every 5 (five) minutes as needed for chest pain. If you need more than 3 doses in 24 hours, call 911 or go to nearest ER, Disp: 25 tablet, Rfl: 3   predniSONE  (DELTASONE ) 20 MG tablet, Take 1 tablet (20 mg total) by mouth daily in case of gout flare-must follow-up with primary care physician if does not resolve with this, Disp: 3 tablet, Rfl: 0   Study - [facility] - selatogrel 16 mg/0.5 mL or placebo SQ injection (PI-Christopher), Inject 0.5 mLs (16 mg total) into the skin as needed (As needed for for symptoms of a heart attack (chest pain).)., Disp: 1 mL, Rfl: 0  "

## 2024-06-02 ENCOUNTER — Other Ambulatory Visit: Payer: Self-pay

## 2024-06-05 ENCOUNTER — Other Ambulatory Visit: Payer: Self-pay

## 2024-06-08 ENCOUNTER — Other Ambulatory Visit: Payer: Self-pay

## 2024-06-09 ENCOUNTER — Other Ambulatory Visit: Payer: Self-pay

## 2024-06-09 MED ORDER — FREESTYLE LIBRE 3 PLUS SENSOR MISC
11 refills | Status: AC
  Filled 2024-06-09: qty 2, 28d supply, fill #0

## 2024-06-12 ENCOUNTER — Other Ambulatory Visit: Payer: Self-pay

## 2024-06-13 ENCOUNTER — Other Ambulatory Visit: Payer: Self-pay

## 2024-06-23 ENCOUNTER — Other Ambulatory Visit: Payer: Self-pay

## 2024-10-27 ENCOUNTER — Encounter: Admitting: Family Medicine
